# Patient Record
Sex: Female | Born: 1950 | Race: White | Hispanic: No | Smoking: Never smoker
Health system: Southern US, Community
[De-identification: ages and names within clinical notes are randomized; demographics above are authoritative.]

## PROBLEM LIST (undated history)

## (undated) DIAGNOSIS — E785 Hyperlipidemia, unspecified: Secondary | ICD-10-CM

## (undated) DIAGNOSIS — G4730 Sleep apnea, unspecified: Secondary | ICD-10-CM

## (undated) DIAGNOSIS — E669 Obesity, unspecified: Secondary | ICD-10-CM

## (undated) DIAGNOSIS — Z9889 Other specified postprocedural states: Secondary | ICD-10-CM

## (undated) DIAGNOSIS — N189 Chronic kidney disease, unspecified: Secondary | ICD-10-CM

## (undated) DIAGNOSIS — R002 Palpitations: Secondary | ICD-10-CM

## (undated) DIAGNOSIS — K760 Fatty (change of) liver, not elsewhere classified: Secondary | ICD-10-CM

## (undated) DIAGNOSIS — I10 Essential (primary) hypertension: Secondary | ICD-10-CM

## (undated) DIAGNOSIS — E119 Type 2 diabetes mellitus without complications: Secondary | ICD-10-CM

## (undated) DIAGNOSIS — E668 Other obesity: Secondary | ICD-10-CM

## (undated) DIAGNOSIS — M5412 Radiculopathy, cervical region: Secondary | ICD-10-CM

## (undated) DIAGNOSIS — T8859XA Other complications of anesthesia, initial encounter: Secondary | ICD-10-CM

## (undated) DIAGNOSIS — K219 Gastro-esophageal reflux disease without esophagitis: Secondary | ICD-10-CM

## (undated) DIAGNOSIS — R112 Nausea with vomiting, unspecified: Secondary | ICD-10-CM

## (undated) DIAGNOSIS — N289 Disorder of kidney and ureter, unspecified: Secondary | ICD-10-CM

## (undated) DIAGNOSIS — M1990 Unspecified osteoarthritis, unspecified site: Secondary | ICD-10-CM

## (undated) HISTORY — PX: CARDIAC CATHETERIZATION: SHX172

## (undated) HISTORY — PX: JOINT REPLACEMENT: SHX530

## (undated) HISTORY — DX: Chronic kidney disease, unspecified: N18.9

## (undated) HISTORY — DX: Fatty (change of) liver, not elsewhere classified: K76.0

## (undated) HISTORY — DX: Essential (primary) hypertension: I10

## (undated) HISTORY — PX: COLONOSCOPY: SHX174

## (undated) HISTORY — DX: Other obesity: E66.8

## (undated) HISTORY — DX: Hyperlipidemia, unspecified: E78.5

## (undated) HISTORY — DX: Obesity, unspecified: E66.9

## (undated) HISTORY — PX: CERVICAL FUSION: SHX112

## (undated) HISTORY — DX: Radiculopathy, cervical region: M54.12

## (undated) HISTORY — PX: CHOLECYSTECTOMY: SHX55

## (undated) HISTORY — DX: Type 2 diabetes mellitus without complications: E11.9

## (undated) HISTORY — PX: ABDOMINAL HYSTERECTOMY: SHX81

## (undated) HISTORY — PX: SPINE SURGERY: SHX786

---

## 1998-03-15 ENCOUNTER — Encounter: Payer: Self-pay | Admitting: Neurosurgery

## 1998-03-15 ENCOUNTER — Ambulatory Visit (HOSPITAL_COMMUNITY): Admission: RE | Admit: 1998-03-15 | Discharge: 1998-03-15 | Payer: Self-pay | Admitting: Neurosurgery

## 1999-04-23 ENCOUNTER — Encounter: Admission: RE | Admit: 1999-04-23 | Discharge: 1999-04-23 | Payer: Self-pay | Admitting: Orthopedic Surgery

## 1999-04-25 ENCOUNTER — Ambulatory Visit (HOSPITAL_BASED_OUTPATIENT_CLINIC_OR_DEPARTMENT_OTHER): Admission: RE | Admit: 1999-04-25 | Discharge: 1999-04-25 | Payer: Self-pay | Admitting: Orthopedic Surgery

## 2002-06-23 ENCOUNTER — Encounter: Admission: RE | Admit: 2002-06-23 | Discharge: 2002-06-23 | Payer: Self-pay | Admitting: Neurosurgery

## 2002-06-23 ENCOUNTER — Encounter: Payer: Self-pay | Admitting: Neurosurgery

## 2002-07-20 ENCOUNTER — Ambulatory Visit (HOSPITAL_COMMUNITY): Admission: RE | Admit: 2002-07-20 | Discharge: 2002-07-21 | Payer: Self-pay | Admitting: Neurosurgery

## 2002-07-20 ENCOUNTER — Encounter: Payer: Self-pay | Admitting: Neurosurgery

## 2003-05-30 ENCOUNTER — Ambulatory Visit (HOSPITAL_COMMUNITY): Admission: RE | Admit: 2003-05-30 | Discharge: 2003-05-30 | Payer: Self-pay | Admitting: Neurology

## 2003-06-22 ENCOUNTER — Observation Stay (HOSPITAL_COMMUNITY): Admission: RE | Admit: 2003-06-22 | Discharge: 2003-06-23 | Payer: Self-pay | Admitting: Neurosurgery

## 2004-05-11 ENCOUNTER — Ambulatory Visit: Payer: Self-pay | Admitting: Anesthesiology

## 2004-10-23 ENCOUNTER — Ambulatory Visit: Payer: Self-pay | Admitting: Internal Medicine

## 2004-10-29 ENCOUNTER — Ambulatory Visit: Payer: Self-pay | Admitting: Internal Medicine

## 2006-02-26 ENCOUNTER — Other Ambulatory Visit: Payer: Self-pay

## 2006-03-17 ENCOUNTER — Ambulatory Visit: Payer: Self-pay | Admitting: Otolaryngology

## 2006-06-05 ENCOUNTER — Emergency Department: Payer: Self-pay | Admitting: Unknown Physician Specialty

## 2008-04-02 ENCOUNTER — Ambulatory Visit: Payer: Self-pay | Admitting: Orthopedic Surgery

## 2009-03-06 ENCOUNTER — Ambulatory Visit: Payer: Self-pay | Admitting: Family Medicine

## 2009-03-14 ENCOUNTER — Ambulatory Visit: Payer: Self-pay | Admitting: Family Medicine

## 2009-04-23 ENCOUNTER — Ambulatory Visit: Payer: Self-pay | Admitting: Family Medicine

## 2009-05-20 ENCOUNTER — Ambulatory Visit: Payer: Self-pay | Admitting: Gastroenterology

## 2009-06-12 ENCOUNTER — Ambulatory Visit: Payer: Self-pay | Admitting: Gastroenterology

## 2009-09-30 ENCOUNTER — Ambulatory Visit: Payer: Self-pay | Admitting: Otolaryngology

## 2009-10-17 ENCOUNTER — Ambulatory Visit: Payer: Self-pay | Admitting: Otolaryngology

## 2009-11-29 ENCOUNTER — Ambulatory Visit: Payer: Self-pay | Admitting: Family Medicine

## 2009-12-31 ENCOUNTER — Ambulatory Visit: Payer: Self-pay | Admitting: Gastroenterology

## 2010-05-29 ENCOUNTER — Ambulatory Visit: Payer: Self-pay | Admitting: Family Medicine

## 2011-07-02 ENCOUNTER — Ambulatory Visit: Payer: Self-pay | Admitting: Family Medicine

## 2012-01-28 ENCOUNTER — Ambulatory Visit: Payer: Self-pay | Admitting: Family Medicine

## 2012-06-20 ENCOUNTER — Ambulatory Visit: Payer: Self-pay | Admitting: Family Medicine

## 2012-07-15 ENCOUNTER — Ambulatory Visit: Payer: Self-pay | Admitting: Family Medicine

## 2012-09-20 ENCOUNTER — Ambulatory Visit: Payer: Self-pay | Admitting: Family Medicine

## 2013-09-22 ENCOUNTER — Ambulatory Visit: Payer: Self-pay | Admitting: Family Medicine

## 2013-10-13 ENCOUNTER — Ambulatory Visit: Payer: Self-pay | Admitting: Family Medicine

## 2013-11-01 ENCOUNTER — Ambulatory Visit: Payer: Self-pay | Admitting: Family Medicine

## 2013-12-21 ENCOUNTER — Ambulatory Visit: Payer: Self-pay | Admitting: Family Medicine

## 2014-01-02 ENCOUNTER — Ambulatory Visit: Payer: Self-pay | Admitting: Family Medicine

## 2014-02-08 ENCOUNTER — Ambulatory Visit: Payer: Self-pay | Admitting: Gastroenterology

## 2014-02-13 ENCOUNTER — Ambulatory Visit: Payer: Self-pay | Admitting: Gastroenterology

## 2014-03-08 ENCOUNTER — Ambulatory Visit: Payer: Self-pay | Admitting: Family Medicine

## 2014-07-30 LAB — SURGICAL PATHOLOGY

## 2014-09-24 ENCOUNTER — Other Ambulatory Visit: Payer: Self-pay | Admitting: Family Medicine

## 2014-09-24 MED ORDER — COLESEVELAM HCL 625 MG PO TABS: 1875.0000 mg | ORAL_TABLET | Freq: Two times a day (BID) | ORAL | Status: DC

## 2014-10-02 ENCOUNTER — Encounter: Payer: Self-pay | Admitting: Family Medicine

## 2014-10-02 ENCOUNTER — Ambulatory Visit (INDEPENDENT_AMBULATORY_CARE_PROVIDER_SITE_OTHER): Payer: Medicare Other | Admitting: Family Medicine

## 2014-10-02 VITALS — BP 124/81 | HR 53 | Temp 98.1°F | Ht 62.4 in | Wt 328.0 lb

## 2014-10-02 DIAGNOSIS — I10 Essential (primary) hypertension: Secondary | ICD-10-CM | POA: Diagnosis not present

## 2014-10-02 DIAGNOSIS — E785 Hyperlipidemia, unspecified: Secondary | ICD-10-CM | POA: Diagnosis not present

## 2014-10-02 DIAGNOSIS — Z6841 Body Mass Index (BMI) 40.0 and over, adult: Secondary | ICD-10-CM | POA: Diagnosis not present

## 2014-10-02 DIAGNOSIS — E1169 Type 2 diabetes mellitus with other specified complication: Secondary | ICD-10-CM | POA: Insufficient documentation

## 2014-10-02 DIAGNOSIS — E1142 Type 2 diabetes mellitus with diabetic polyneuropathy: Secondary | ICD-10-CM | POA: Insufficient documentation

## 2014-10-02 DIAGNOSIS — Z0000 Encounter for general adult medical examination without abnormal findings: Secondary | ICD-10-CM

## 2014-10-02 DIAGNOSIS — Z1239 Encounter for other screening for malignant neoplasm of breast: Secondary | ICD-10-CM

## 2014-10-02 DIAGNOSIS — E119 Type 2 diabetes mellitus without complications: Secondary | ICD-10-CM | POA: Diagnosis not present

## 2014-10-02 DIAGNOSIS — E1159 Type 2 diabetes mellitus with other circulatory complications: Secondary | ICD-10-CM | POA: Insufficient documentation

## 2014-10-02 MED ORDER — LOVASTATIN 40 MG PO TABS: 40.0000 mg | ORAL_TABLET | Freq: Every day | ORAL | Status: DC

## 2014-10-02 MED ORDER — NEXIUM 40 MG PO CPDR: 40.0000 mg | DELAYED_RELEASE_CAPSULE | Freq: Every day | ORAL | Status: DC

## 2014-10-02 MED ORDER — PREGABALIN 150 MG PO CAPS: 150.0000 mg | ORAL_CAPSULE | Freq: Three times a day (TID) | ORAL | Status: DC

## 2014-10-02 MED ORDER — METOPROLOL SUCCINATE ER 50 MG PO TB24: 50.0000 mg | ORAL_TABLET | Freq: Every day | ORAL | Status: DC

## 2014-10-02 MED ORDER — COLESEVELAM HCL 625 MG PO TABS: 1875.0000 mg | ORAL_TABLET | Freq: Two times a day (BID) | ORAL | Status: DC

## 2014-10-02 MED ORDER — EMPAGLIFLOZIN-LINAGLIPTIN 10-5 MG PO TABS: 1.0000 | ORAL_TABLET | Freq: Every day | ORAL | Status: DC

## 2014-10-02 NOTE — Assessment & Plan Note (Signed)
Discussed diet and exercise and wt loss

## 2014-10-02 NOTE — Assessment & Plan Note (Signed)
The current medical regimen is effective;  continue present plan and medications.  

## 2014-10-02 NOTE — Assessment & Plan Note (Signed)
stable °

## 2014-10-02 NOTE — Progress Notes (Signed)
BP 124/81 mmHg  Pulse 53  Temp(Src) 98.1 F (36.7 C)  Ht 5' 2.4" (1.585 m)  Wt 328 lb (148.78 kg)  BMI 59.22 kg/m2  SpO2 99%   Subjective:    Patient ID: Anna Juarez, female    DOB: 06-22-1950, 64 y.o.   MRN: [id number]  HPI: Anna Juarez is a 64 y.o. female  Chief Complaint  Patient presents with  . Annual Exam   Lt foot takes lyrica but still bothers Discussed zostrix GI reflux better BP lipids doing well on meds No side effects Takes every day    Relevant past medical, surgical, family and social history reviewed and updated as indicated. Interim medical history since our last visit reviewed. Allergies and medications reviewed and updated.  Review of Systems  Constitutional: Negative.   HENT: Negative.   Eyes: Negative.   Respiratory: Negative.   Cardiovascular: Negative.   Gastrointestinal: Negative.   Endocrine: Negative.   Genitourinary: Negative.   Musculoskeletal: Negative.   Skin: Negative.   Allergic/Immunologic: Negative.   Neurological: Negative.   Hematological: Negative.   Psychiatric/Behavioral: Negative.     Per HPI unless specifically indicated above     Objective:    BP 124/81 mmHg  Pulse 53  Temp(Src) 98.1 F (36.7 C)  Ht 5' 2.4" (1.585 m)  Wt 328 lb (148.78 kg)  BMI 59.22 kg/m2  SpO2 99%  Wt Readings from Last 3 Encounters:  10/02/14 328 lb (148.78 kg)  08/09/14 332 lb (150.594 kg)    Physical Exam  Constitutional: She is oriented to person, place, and time. She appears well-developed and well-nourished.  HENT:  Head: Normocephalic and atraumatic.  Right Ear: External ear normal.  Left Ear: External ear normal.  Nose: Nose normal.  Mouth/Throat: Oropharynx is clear and moist.  Eyes: Conjunctivae and EOM are normal. Pupils are equal, round, and reactive to light.  Neck: Normal range of motion. Neck supple. Carotid bruit is not present.  Cardiovascular: Normal rate, regular rhythm and normal heart sounds.   No  murmur heard. Pulmonary/Chest: Effort normal and breath sounds normal. Right breast exhibits no mass and no tenderness. Left breast exhibits no mass and no tenderness. Breasts are symmetrical.  Abdominal: Soft. Bowel sounds are normal. There is no hepatosplenomegaly.  Massive obesity   Musculoskeletal: Normal range of motion.  Neurological: She is alert and oriented to person, place, and time.  Skin: No rash noted.  Psychiatric: She has a normal mood and affect. Her behavior is normal. Judgment and thought content normal.        Assessment & Plan:   Problem List Items Addressed This Visit      Cardiovascular and Mediastinum   Hypertension    The current medical regimen is effective;  continue present plan and medications.       Relevant Medications   colesevelam (WELCHOL) 625 MG tablet   lovastatin (MEVACOR) 40 MG tablet   metoprolol succinate (TOPROL-XL) 50 MG 24 hr tablet   Other Relevant Orders   Comprehensive metabolic panel   CBC with Differential/Platelet   Urinalysis, Routine w reflex microscopic (not at [facility])   TSH   Lipid panel     Endocrine   Diabetes mellitus without complication    stable      Relevant Medications   Empagliflozin-Linagliptin (GLYXAMBI) 10-5 MG TABS   lovastatin (MEVACOR) 40 MG tablet   Other Relevant Orders   Comprehensive metabolic panel   CBC with Differential/Platelet   Urinalysis, Routine w reflex  microscopic (not at [facility])   TSH   Lipid panel     Other   Hyperlipidemia    The current medical regimen is effective;  continue present plan and medications.       Relevant Medications   colesevelam (WELCHOL) 625 MG tablet   lovastatin (MEVACOR) 40 MG tablet   metoprolol succinate (TOPROL-XL) 50 MG 24 hr tablet   Other Relevant Orders   Comprehensive metabolic panel   CBC with Differential/Platelet   Urinalysis, Routine w reflex microscopic (not at [facility])   TSH   Lipid panel   BMI 50.0-59.9, adult - Primary    Discussed diet  and exercise and wt loss      Relevant Orders   Comprehensive metabolic panel   CBC with Differential/Platelet   Urinalysis, Routine w reflex microscopic (not at [facility])   TSH   Lipid panel       Follow up plan: Return in about 3 months (around 01/02/2015).

## 2014-10-09 ENCOUNTER — Telehealth: Payer: Self-pay

## 2014-10-09 ENCOUNTER — Ambulatory Visit
Admission: RE | Admit: 2014-10-09 | Discharge: 2014-10-09 | Disposition: A | Discharge status: Home / Self Care | Payer: Medicare Other | Source: Ambulatory Visit | Attending: Family Medicine | Admitting: Family Medicine

## 2014-10-09 DIAGNOSIS — Z1231 Encounter for screening mammogram for malignant neoplasm of breast: Secondary | ICD-10-CM | POA: Diagnosis present

## 2014-10-09 DIAGNOSIS — Z1239 Encounter for other screening for malignant neoplasm of breast: Secondary | ICD-10-CM

## 2014-10-09 NOTE — Telephone Encounter (Signed)
-----   Message from Valerie Roys, DO sent at 10/09/2014 12:39 PM EDT ----- Patient did not get her blood work done. Please get her appt for lab work to get overdue blood work done.

## 2014-10-09 NOTE — Telephone Encounter (Signed)
Notified patient via voicemail. 

## 2014-10-10 ENCOUNTER — Encounter: Payer: Self-pay | Admitting: Family Medicine

## 2014-10-16 ENCOUNTER — Other Ambulatory Visit: Payer: Medicare Other

## 2014-10-16 ENCOUNTER — Other Ambulatory Visit: Payer: Self-pay | Admitting: Family Medicine

## 2014-10-16 DIAGNOSIS — E785 Hyperlipidemia, unspecified: Secondary | ICD-10-CM

## 2014-10-16 DIAGNOSIS — Z0000 Encounter for general adult medical examination without abnormal findings: Secondary | ICD-10-CM

## 2014-10-16 DIAGNOSIS — I10 Essential (primary) hypertension: Secondary | ICD-10-CM

## 2014-10-16 DIAGNOSIS — E119 Type 2 diabetes mellitus without complications: Secondary | ICD-10-CM

## 2014-10-16 DIAGNOSIS — Z6841 Body Mass Index (BMI) 40.0 and over, adult: Secondary | ICD-10-CM

## 2014-10-16 LAB — URINALYSIS, ROUTINE W REFLEX MICROSCOPIC
BILIRUBIN UA: NEGATIVE
Ketones, UA: NEGATIVE
LEUKOCYTES UA: NEGATIVE
NITRITE UA: NEGATIVE
PH UA: 5.5 (ref 5.0–7.5)
Protein, UA: NEGATIVE
RBC UA: NEGATIVE
Specific Gravity, UA: 1.015 (ref 1.005–1.030)
Urobilinogen, Ur: 0.2 mg/dL (ref 0.2–1.0)

## 2014-10-17 LAB — LIPID PANEL
Chol/HDL Ratio: 3.1 ratio units (ref 0.0–4.4)
Cholesterol, Total: 131 mg/dL (ref 100–199)
HDL: 42 mg/dL (ref >39)
LDL CALC: 61 mg/dL (ref 0–99)
TRIGLYCERIDES: 139 mg/dL (ref 0–149)
VLDL Cholesterol Cal: 28 mg/dL (ref 5–40)

## 2014-10-17 LAB — COMPREHENSIVE METABOLIC PANEL
A/G RATIO: 1.5 (ref 1.1–2.5)
ALBUMIN: 4.2 g/dL (ref 3.6–4.8)
ALT: 32 IU/L (ref 0–32)
AST: 45 IU/L — ABNORMAL HIGH (ref 0–40)
Alkaline Phosphatase: 54 IU/L (ref 39–117)
BUN/Creatinine Ratio: 16 (ref 11–26)
BUN: 23 mg/dL (ref 8–27)
Bilirubin Total: 0.5 mg/dL (ref 0.0–1.2)
CALCIUM: 9.2 mg/dL (ref 8.7–10.3)
CHLORIDE: 101 mmol/L (ref 97–108)
CO2: 22 mmol/L (ref 18–29)
CREATININE: 1.47 mg/dL — AB (ref 0.57–1.00)
GFR, EST AFRICAN AMERICAN: 43 mL/min/{1.73_m2} — AB (ref >59)
GFR, EST NON AFRICAN AMERICAN: 37 mL/min/{1.73_m2} — AB (ref >59)
Globulin, Total: 2.8 g/dL (ref 1.5–4.5)
Glucose: 169 mg/dL — ABNORMAL HIGH (ref 65–99)
Potassium: 4.9 mmol/L (ref 3.5–5.2)
SODIUM: 142 mmol/L (ref 134–144)
TOTAL PROTEIN: 7 g/dL (ref 6.0–8.5)

## 2014-10-17 LAB — CBC WITH DIFFERENTIAL/PLATELET
BASOS: 1 %
Basophils Absolute: 0 10*3/uL (ref 0.0–0.2)
EOS (ABSOLUTE): 0.1 10*3/uL (ref 0.0–0.4)
EOS: 3 %
Hematocrit: 39.2 % (ref 34.0–46.6)
Hemoglobin: 13 g/dL (ref 11.1–15.9)
Immature Grans (Abs): 0 10*3/uL (ref 0.0–0.1)
Immature Granulocytes: 0 %
Lymphocytes Absolute: 1.5 10*3/uL (ref 0.7–3.1)
Lymphs: 28 %
MCH: 30 pg (ref 26.6–33.0)
MCHC: 33.2 g/dL (ref 31.5–35.7)
MCV: 91 fL (ref 79–97)
MONOCYTES: 11 %
MONOS ABS: 0.6 10*3/uL (ref 0.1–0.9)
NEUTROS ABS: 3.1 10*3/uL (ref 1.4–7.0)
Neutrophils: 57 %
Platelets: 182 10*3/uL (ref 150–379)
RBC: 4.33 x10E6/uL (ref 3.77–5.28)
RDW: 14.1 % (ref 12.3–15.4)
WBC: 5.3 10*3/uL (ref 3.4–10.8)

## 2014-10-17 LAB — TSH: TSH: 2.08 u[IU]/mL (ref 0.450–4.500)

## 2014-11-19 ENCOUNTER — Other Ambulatory Visit: Payer: Self-pay | Admitting: Family Medicine

## 2014-11-19 MED ORDER — PREGABALIN 150 MG PO CAPS: 150.0000 mg | ORAL_CAPSULE | Freq: Three times a day (TID) | ORAL | Status: DC

## 2014-11-20 ENCOUNTER — Other Ambulatory Visit: Payer: Self-pay | Admitting: Family Medicine

## 2014-12-31 ENCOUNTER — Other Ambulatory Visit: Payer: Self-pay | Admitting: Family Medicine

## 2015-01-02 ENCOUNTER — Encounter: Payer: Self-pay | Admitting: Family Medicine

## 2015-01-02 ENCOUNTER — Ambulatory Visit (INDEPENDENT_AMBULATORY_CARE_PROVIDER_SITE_OTHER): Payer: Medicare Other | Admitting: Family Medicine

## 2015-01-02 VITALS — BP 108/70 | HR 50 | Temp 97.8°F | Ht 62.3 in | Wt 321.0 lb

## 2015-01-02 DIAGNOSIS — Z23 Encounter for immunization: Secondary | ICD-10-CM | POA: Diagnosis not present

## 2015-01-02 DIAGNOSIS — E1122 Type 2 diabetes mellitus with diabetic chronic kidney disease: Secondary | ICD-10-CM

## 2015-01-02 DIAGNOSIS — N183 Chronic kidney disease, stage 3 (moderate): Secondary | ICD-10-CM | POA: Diagnosis not present

## 2015-01-02 DIAGNOSIS — Z6841 Body Mass Index (BMI) 40.0 and over, adult: Secondary | ICD-10-CM

## 2015-01-02 DIAGNOSIS — E119 Type 2 diabetes mellitus without complications: Secondary | ICD-10-CM | POA: Diagnosis not present

## 2015-01-02 DIAGNOSIS — I10 Essential (primary) hypertension: Secondary | ICD-10-CM | POA: Diagnosis not present

## 2015-01-02 NOTE — Assessment & Plan Note (Signed)
Continue good diabetes care

## 2015-01-02 NOTE — Progress Notes (Signed)
BP 108/70 mmHg  Pulse 50  Temp(Src) 97.8 F (36.6 C)  Ht 5' 2.3" (1.582 m)  Wt 321 lb (145.605 kg)  BMI 58.18 kg/m2  SpO2 96%   Subjective:    Patient ID: Anna Juarez, female    DOB: 1950/07/06, 64 y.o.   MRN: [id number]  HPI: Anna Juarez is a 64 y.o. female  Chief Complaint  Patient presents with  . Diabetes   Patient doing well blood sugar coming down and is lost 10 pounds this summer. His weight loss was secondary to stress his husband's nursing home bound with knee replacement surgery. Blood pressure doing well. Lipids doing well No complaints from medication takes everyday with no side effects. seeing a kidney doctor for CK D. Relevant past medical, surgical, family and social history reviewed and updated as indicated. Interim medical history since our last visit reviewed. Allergies and medications reviewed and updated.  Review of Systems  Constitutional: Negative.   Respiratory: Negative.   Cardiovascular: Negative.     Per HPI unless specifically indicated above     Objective:    BP 108/70 mmHg  Pulse 50  Temp(Src) 97.8 F (36.6 C)  Ht 5' 2.3" (1.582 m)  Wt 321 lb (145.605 kg)  BMI 58.18 kg/m2  SpO2 96%  Wt Readings from Last 3 Encounters:  01/02/15 321 lb (145.605 kg)  10/02/14 328 lb (148.78 kg)  08/09/14 332 lb (150.594 kg)    Physical Exam  Constitutional: She is oriented to person, place, and time. She appears well-developed and well-nourished. No distress.  HENT:  Head: Normocephalic and atraumatic.  Right Ear: Hearing normal.  Left Ear: Hearing normal.  Nose: Nose normal.  Eyes: Conjunctivae and lids are normal. Right eye exhibits no discharge. Left eye exhibits no discharge. No scleral icterus.  Cardiovascular: Normal rate, regular rhythm and normal heart sounds.   Pulmonary/Chest: Effort normal and breath sounds normal. No respiratory distress.  Musculoskeletal: Normal range of motion.  Neurological: She is alert and  oriented to person, place, and time.  Skin: Skin is intact. No rash noted.  Psychiatric: She has a normal mood and affect. Her speech is normal and behavior is normal. Judgment and thought content normal. Cognition and memory are normal.    Results for orders placed or performed in visit on 10/16/14  Comprehensive metabolic panel  Result Value Ref Range   Glucose 169 (H) 65 - 99 mg/dL   BUN 23 8 - 27 mg/dL   Creatinine, Ser 1.47 (H) 0.57 - 1.00 mg/dL   GFR calc non Af Amer 37 (L) >59 mL/min/1.73   GFR calc Af Amer 43 (L) >59 mL/min/1.73   BUN/Creatinine Ratio 16 11 - 26   Sodium 142 134 - 144 mmol/L   Potassium 4.9 3.5 - 5.2 mmol/L   Chloride 101 97 - 108 mmol/L   CO2 22 18 - 29 mmol/L   Calcium 9.2 8.7 - 10.3 mg/dL   Total Protein 7.0 6.0 - 8.5 g/dL   Albumin 4.2 3.6 - 4.8 g/dL   Globulin, Total 2.8 1.5 - 4.5 g/dL   Albumin/Globulin Ratio 1.5 1.1 - 2.5   Bilirubin Total 0.5 0.0 - 1.2 mg/dL   Alkaline Phosphatase 54 39 - 117 IU/L   AST 45 (H) 0 - 40 IU/L   ALT 32 0 - 32 IU/L  CBC with Differential/Platelet  Result Value Ref Range   WBC 5.3 3.4 - 10.8 x10E3/uL   RBC 4.33 3.77 - 5.28 x10E6/uL  Hemoglobin 13.0 11.1 - 15.9 g/dL   Hematocrit 39.2 34.0 - 46.6 %   MCV 91 79 - 97 fL   MCH 30.0 26.6 - 33.0 pg   MCHC 33.2 31.5 - 35.7 g/dL   RDW 14.1 12.3 - 15.4 %   Platelets 182 150 - 379 x10E3/uL   Neutrophils 57 %   Lymphs 28 %   Monocytes 11 %   Eos 3 %   Basos 1 %   Neutrophils Absolute 3.1 1.4 - 7.0 x10E3/uL   Lymphocytes Absolute 1.5 0.7 - 3.1 x10E3/uL   Monocytes Absolute 0.6 0.1 - 0.9 x10E3/uL   EOS (ABSOLUTE) 0.1 0.0 - 0.4 x10E3/uL   Basophils Absolute 0.0 0.0 - 0.2 x10E3/uL   Immature Granulocytes 0 %   Immature Grans (Abs) 0.0 0.0 - 0.1 x10E3/uL  TSH  Result Value Ref Range   TSH 2.080 0.450 - 4.500 uIU/mL  Lipid panel  Result Value Ref Range   Cholesterol, Total 131 100 - 199 mg/dL   Triglycerides 139 0 - 149 mg/dL   HDL 42 >39 mg/dL   VLDL Cholesterol Cal  28 5 - 40 mg/dL   LDL Calculated 61 0 - 99 mg/dL   Chol/HDL Ratio 3.1 0.0 - 4.4 ratio units  Urinalysis, Routine w reflex microscopic (not at [facility])  Result Value Ref Range   Specific Gravity, UA 1.015 1.005 - 1.030   pH, UA 5.5 5.0 - 7.5   Color, UA Yellow Yellow   Appearance Ur Clear Clear   Leukocytes, UA Negative Negative   Protein, UA Negative Negative/Trace   Glucose, UA 3+ (A) Negative   Ketones, UA Negative Negative   RBC, UA Negative Negative   Bilirubin, UA Negative Negative   Urobilinogen, Ur 0.2 0.2 - 1.0 mg/dL   Nitrite, UA Negative Negative      Assessment & Plan:   Problem List Items Addressed This Visit      Cardiovascular and Mediastinum   Essential hypertension    The current medical regimen is effective;  continue present plan and medications.         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

## 2015-01-02 NOTE — Assessment & Plan Note (Signed)
The current medical regimen is effective;  continue present plan and medications.  

## 2015-01-02 NOTE — Assessment & Plan Note (Signed)
Continue diet exercise weight loss

## 2015-01-04 LAB — MICROALBUMIN, URINE WAIVED
CREATININE, URINE WAIVED: 200 mg/dL (ref 10–300)
Microalb, Ur Waived: 30 mg/L — ABNORMAL HIGH (ref 0–19)
Microalb/Creat Ratio: <30 mg/g (ref <30)

## 2015-01-04 LAB — BAYER DCA HB A1C WAIVED: HB A1C: 7.3 % — AB (ref <7.0)

## 2015-01-29 ENCOUNTER — Telehealth: Payer: Self-pay

## 2015-01-29 NOTE — Telephone Encounter (Signed)
Called patient about colocancer screening and diabetic eye exam within the past year. Patient said she got her eye exam back in the spring at [facility]. Patient doesn't want a colonoscopy but does want the cologuard.  Will call [facility] and request her last eye exam.

## 2015-02-05 ENCOUNTER — Other Ambulatory Visit: Payer: Self-pay | Admitting: Family Medicine

## 2015-02-05 DIAGNOSIS — Z1212 Encounter for screening for malignant neoplasm of rectum: Principal | ICD-10-CM

## 2015-02-05 DIAGNOSIS — Z1211 Encounter for screening for malignant neoplasm of colon: Secondary | ICD-10-CM

## 2015-03-27 ENCOUNTER — Ambulatory Visit (INDEPENDENT_AMBULATORY_CARE_PROVIDER_SITE_OTHER): Payer: Medicare Other | Admitting: Family Medicine

## 2015-03-27 ENCOUNTER — Encounter: Payer: Self-pay | Admitting: Family Medicine

## 2015-03-27 VITALS — BP 114/75 | HR 53 | Temp 98.0°F | Ht 62.3 in | Wt 320.0 lb

## 2015-03-27 DIAGNOSIS — N183 Chronic kidney disease, stage 3 (moderate): Secondary | ICD-10-CM | POA: Diagnosis not present

## 2015-03-27 DIAGNOSIS — Z6841 Body Mass Index (BMI) 40.0 and over, adult: Secondary | ICD-10-CM | POA: Diagnosis not present

## 2015-03-27 DIAGNOSIS — E119 Type 2 diabetes mellitus without complications: Secondary | ICD-10-CM | POA: Diagnosis not present

## 2015-03-27 DIAGNOSIS — E785 Hyperlipidemia, unspecified: Secondary | ICD-10-CM

## 2015-03-27 DIAGNOSIS — I10 Essential (primary) hypertension: Secondary | ICD-10-CM | POA: Diagnosis not present

## 2015-03-27 DIAGNOSIS — E1122 Type 2 diabetes mellitus with diabetic chronic kidney disease: Secondary | ICD-10-CM | POA: Diagnosis not present

## 2015-03-27 LAB — LP+ALT+AST PICCOLO, WAIVED
ALT (SGPT) Piccolo, Waived: 27 U/L (ref 10–47)
AST (SGOT) PICCOLO, WAIVED: 38 U/L (ref 11–38)
CHOL/HDL RATIO PICCOLO,WAIVE: 2.4 mg/dL
Cholesterol Piccolo, Waived: 149 mg/dL (ref <200)
HDL CHOL PICCOLO, WAIVED: 61 mg/dL (ref >59)
LDL Chol Calc Piccolo Waived: 54 mg/dL (ref <100)
TRIGLYCERIDES PICCOLO,WAIVED: 169 mg/dL — AB (ref <150)
VLDL CHOL CALC PICCOLO,WAIVE: 34 mg/dL — AB (ref <30)

## 2015-03-27 LAB — BAYER DCA HB A1C WAIVED: HB A1C (BAYER DCA - WAIVED): 7.1 % — ABNORMAL HIGH (ref <7.0)

## 2015-03-27 MED ORDER — PREGABALIN 150 MG PO CAPS: 150.0000 mg | ORAL_CAPSULE | Freq: Three times a day (TID) | ORAL | Status: DC

## 2015-03-27 NOTE — Assessment & Plan Note (Signed)
The current medical regimen is effective;  continue present plan and medications.  

## 2015-03-27 NOTE — Assessment & Plan Note (Signed)
Discussed diet  

## 2015-03-27 NOTE — Progress Notes (Signed)
BP 114/75 mmHg  Pulse 53  Temp(Src) 98 F (36.7 C)  Ht 5' 2.3" (1.582 m)  Wt 320 lb (145.151 kg)  BMI 58.00 kg/m2  SpO2 98%   Subjective:    Patient ID: Anna Juarez, female    DOB: 1950-12-01, 64 y.o.   MRN: [id number]  HPI: Anna Juarez is a 64 y.o. female  Chief Complaint  Patient presents with  . Diabetes  . Hyperlipidemia  . Hypertension   Patient all in all doing well except for some Christmas eating Blood pressure cholesterol diabetes doing well noted low blood sugar spells no side effects with medications and taking medicines faithfully DP and stable with Lyrica Relevant past medical, surgical, family and social history reviewed and updated as indicated. Interim medical history since our last visit reviewed. Allergies and medications reviewed and updated.  Review of Systems  Constitutional: Negative.   Respiratory: Negative.   Cardiovascular: Negative.     Per HPI unless specifically indicated above     Objective:    BP 114/75 mmHg  Pulse 53  Temp(Src) 98 F (36.7 C)  Ht 5' 2.3" (1.582 m)  Wt 320 lb (145.151 kg)  BMI 58.00 kg/m2  SpO2 98%  Wt Readings from Last 3 Encounters:  03/27/15 320 lb (145.151 kg)  01/02/15 321 lb (145.605 kg)  10/02/14 328 lb (148.78 kg)    Physical Exam  Constitutional: She is oriented to person, place, and time. She appears well-developed and well-nourished. No distress.  HENT:  Head: Normocephalic and atraumatic.  Right Ear: Hearing normal.  Left Ear: Hearing normal.  Nose: Nose normal.  Eyes: Conjunctivae and lids are normal. Right eye exhibits no discharge. Left eye exhibits no discharge. No scleral icterus.  Cardiovascular: Normal rate, regular rhythm and normal heart sounds.   Pulmonary/Chest: Effort normal and breath sounds normal. No respiratory distress.  Musculoskeletal: Normal range of motion.  Neurological: She is alert and oriented to person, place, and time.  Skin: Skin is intact. No rash  noted.  Psychiatric: She has a normal mood and affect. Her speech is normal and behavior is normal. Judgment and thought content normal. Cognition and memory are normal.    Results for orders placed or performed in visit on 01/02/15  Bayer DCA Hb A1c Waived  Result Value Ref Range   Bayer DCA Hb A1c Waived 7.3 (H) <7.0 %  Microalbumin, Urine Waived (STAT)  Result Value Ref Range   Microalb, Ur Waived 30 (H) 0 - 19 mg/L   Creatinine, Urine Waived 200 10 - 300 mg/dL   Microalb/Creat Ratio <30 <30 mg/g      Assessment & Plan:   Problem List Items Addressed This Visit      Cardiovascular and Mediastinum   Essential hypertension    The current medical regimen is effective;  continue present plan and medications.         Endocrine   DM type 2 causing CKD stage 3 (HCC)    The current medical regimen is effective;  continue present plan and medications.         Other   Hyperlipidemia    The current medical regimen is effective;  continue present plan and medications.       BMI 50.0-59.9, adult ([place])    Discussed diet       Other Visit Diagnoses    Diabetes mellitus without complication ([place])    -  Primary    Relevant Orders    Bayer DCA Hb  A1c Waived    Hyperlipemia        Relevant Orders    LP+ALT+AST Piccolo, Waived    Essential hypertension, benign        Relevant Orders    Basic metabolic panel        Follow up plan: Return in about 3 months (around 06/25/2015) for a1c.

## 2015-03-28 ENCOUNTER — Encounter: Payer: Self-pay | Admitting: Family Medicine

## 2015-03-28 LAB — BASIC METABOLIC PANEL
BUN/Creatinine Ratio: 16 (ref 11–26)
BUN: 24 mg/dL (ref 8–27)
CALCIUM: 9.5 mg/dL (ref 8.7–10.3)
CO2: 24 mmol/L (ref 18–29)
CREATININE: 1.46 mg/dL — AB (ref 0.57–1.00)
Chloride: 103 mmol/L (ref 96–106)
GFR calc Af Amer: 43 mL/min/{1.73_m2} — ABNORMAL LOW (ref >59)
GFR, EST NON AFRICAN AMERICAN: 37 mL/min/{1.73_m2} — AB (ref >59)
GLUCOSE: 131 mg/dL — AB (ref 65–99)
Potassium: 4.7 mmol/L (ref 3.5–5.2)
SODIUM: 142 mmol/L (ref 134–144)

## 2015-05-20 ENCOUNTER — Other Ambulatory Visit: Payer: Self-pay | Admitting: Family Medicine

## 2015-06-26 ENCOUNTER — Ambulatory Visit: Payer: Medicare Other | Admitting: Family Medicine

## 2015-06-30 ENCOUNTER — Other Ambulatory Visit: Payer: Self-pay | Admitting: Family Medicine

## 2015-07-09 ENCOUNTER — Ambulatory Visit (INDEPENDENT_AMBULATORY_CARE_PROVIDER_SITE_OTHER): Payer: Medicare Other | Admitting: Family Medicine

## 2015-07-09 ENCOUNTER — Encounter: Payer: Self-pay | Admitting: Family Medicine

## 2015-07-09 VITALS — BP 127/76 | HR 58 | Temp 97.8°F | Ht 63.0 in | Wt 321.0 lb

## 2015-07-09 DIAGNOSIS — N183 Chronic kidney disease, stage 3 unspecified: Secondary | ICD-10-CM

## 2015-07-09 DIAGNOSIS — E785 Hyperlipidemia, unspecified: Secondary | ICD-10-CM | POA: Diagnosis not present

## 2015-07-09 DIAGNOSIS — E1122 Type 2 diabetes mellitus with diabetic chronic kidney disease: Secondary | ICD-10-CM

## 2015-07-09 DIAGNOSIS — I10 Essential (primary) hypertension: Secondary | ICD-10-CM | POA: Diagnosis not present

## 2015-07-09 LAB — BAYER DCA HB A1C WAIVED: HB A1C: 7.1 % — AB (ref <7.0)

## 2015-07-09 NOTE — Assessment & Plan Note (Signed)
The current medical regimen is effective;  continue present plan and medications.  

## 2015-07-09 NOTE — Progress Notes (Signed)
BP 127/76 mmHg  Pulse 58  Temp(Src) 97.8 F (36.6 C)  Ht 5\' 3"  (1.6 m)  Wt 321 lb (145.605 kg)  BMI 56.88 kg/m2  SpO2 99%   Subjective:    Patient ID: Anna Juarez, female    DOB: [birth year], 65 y.o.   MRN: [id number]  HPI: Anna Juarez is a 65 y.o. female  Chief Complaint  Patient presents with  . Diabetes   Diabetes doing well no complaints from medications noted low blood sugar spells Blood pressure good control no issues with medications Cholesterol doing well with medications no complaints  Patient having a great deal of stress with husband's very sick from some mystery illness.  Relevant past medical, surgical, family and social history reviewed and updated as indicated. Interim medical history since our last visit reviewed. Allergies and medications reviewed and updated.  Review of Systems  Constitutional: Negative.   Respiratory: Negative.   Cardiovascular: Negative.     Per HPI unless specifically indicated above     Objective:    BP 127/76 mmHg  Pulse 58  Temp(Src) 97.8 F (36.6 C)  Ht 5\' 3"  (1.6 m)  Wt 321 lb (145.605 kg)  BMI 56.88 kg/m2  SpO2 99%  Wt Readings from Last 3 Encounters:  07/09/15 321 lb (145.605 kg)  03/27/15 320 lb (145.151 kg)  01/02/15 321 lb (145.605 kg)    Physical Exam  Constitutional: She is oriented to person, place, and time. She appears well-developed and well-nourished. No distress.  HENT:  Head: Normocephalic and atraumatic.  Right Ear: Hearing normal.  Left Ear: Hearing normal.  Nose: Nose normal.  Eyes: Conjunctivae and lids are normal. Right eye exhibits no discharge. Left eye exhibits no discharge. No scleral icterus.  Cardiovascular: Normal rate, regular rhythm and normal heart sounds.   Pulmonary/Chest: Effort normal and breath sounds normal. No respiratory distress.  Musculoskeletal: Normal range of motion.  Neurological: She is alert and oriented to person, place, and time.  Skin: Skin is  intact. No rash noted.  Psychiatric: She has a normal mood and affect. Her speech is normal and behavior is normal. Judgment and thought content normal. Cognition and memory are normal.    Results for orders placed or performed in visit on 03/27/15  Bayer DCA Hb A1c Waived  Result Value Ref Range   Bayer DCA Hb A1c Waived 7.1 (H) <7.0 %  LP+ALT+AST Piccolo, Waived  Result Value Ref Range   ALT (SGPT) Piccolo, Waived 27 10 - 47 U/L   AST (SGOT) Piccolo, Waived 38 11 - 38 U/L   Cholesterol Piccolo, Waived 149 <200 mg/dL   HDL Chol Piccolo, Waived 61 >59 mg/dL   Triglycerides Piccolo,Waived 169 (H) <150 mg/dL   Chol/HDL Ratio Piccolo,Waive 2.4 mg/dL   LDL Chol Calc Piccolo Waived 54 <100 mg/dL   VLDL Chol Calc Piccolo,Waive 34 (H) <30 mg/dL  Basic metabolic panel  Result Value Ref Range   Glucose 131 (H) 65 - 99 mg/dL   BUN 24 8 - 27 mg/dL   Creatinine, Ser 1.46 (H) 0.57 - 1.00 mg/dL   GFR calc non Af Amer 37 (L) >59 mL/min/1.73   GFR calc Af Amer 43 (L) >59 mL/min/1.73   BUN/Creatinine Ratio 16 11 - 26   Sodium 142 134 - 144 mmol/L   Potassium 4.7 3.5 - 5.2 mmol/L   Chloride 103 96 - 106 mmol/L   CO2 24 18 - 29 mmol/L   Calcium 9.5 8.7 - 10.3 mg/dL  Assessment & Plan:   Problem List Items Addressed This Visit      Cardiovascular and Mediastinum   Essential hypertension    The current medical regimen is effective;  continue present plan and medications.         Endocrine   DM type 2 causing CKD stage 3 ([place]) - Primary    The current medical regimen is effective;  continue present plan and medications.       Relevant Orders   Bayer DCA Hb A1c Waived     Other   Hyperlipidemia    The current medical regimen is effective;  continue present plan and medications.           Follow up plan: Return in about 3 months (around 10/08/2015) for Physical Exam.

## 2015-09-23 ENCOUNTER — Telehealth: Payer: Self-pay | Admitting: Family Medicine

## 2015-09-23 NOTE — Telephone Encounter (Signed)
Pt called stated she is having pain in her elbow. Would like to get a cortisone injection. Can we add patient to the schedule? Please advise.

## 2015-09-23 NOTE — Telephone Encounter (Signed)
Yes work in with [employer]

## 2015-09-24 NOTE — Telephone Encounter (Signed)
I will call pt and add her to Rachel's schedule for next week.

## 2015-09-25 NOTE — Telephone Encounter (Signed)
Called pt to schedule with Anna Juarez. Pt stated she will have to call back to schedule as she will need to find someone to sit with her husband. Thanks.

## 2015-10-13 ENCOUNTER — Other Ambulatory Visit: Payer: Self-pay | Admitting: Family Medicine

## 2015-10-31 ENCOUNTER — Encounter: Payer: Self-pay | Admitting: Family Medicine

## 2015-10-31 ENCOUNTER — Ambulatory Visit (INDEPENDENT_AMBULATORY_CARE_PROVIDER_SITE_OTHER): Payer: Self-pay | Admitting: Family Medicine

## 2015-10-31 VITALS — BP 129/80 | HR 54 | Temp 97.7°F | Ht 63.0 in | Wt 324.0 lb

## 2015-10-31 DIAGNOSIS — E119 Type 2 diabetes mellitus without complications: Secondary | ICD-10-CM

## 2015-10-31 DIAGNOSIS — E1142 Type 2 diabetes mellitus with diabetic polyneuropathy: Secondary | ICD-10-CM

## 2015-10-31 DIAGNOSIS — Z6841 Body Mass Index (BMI) 40.0 and over, adult: Secondary | ICD-10-CM

## 2015-10-31 DIAGNOSIS — Z23 Encounter for immunization: Secondary | ICD-10-CM

## 2015-10-31 DIAGNOSIS — Z0000 Encounter for general adult medical examination without abnormal findings: Secondary | ICD-10-CM

## 2015-10-31 DIAGNOSIS — E785 Hyperlipidemia, unspecified: Secondary | ICD-10-CM

## 2015-10-31 DIAGNOSIS — Z1231 Encounter for screening mammogram for malignant neoplasm of breast: Secondary | ICD-10-CM

## 2015-10-31 DIAGNOSIS — I10 Essential (primary) hypertension: Secondary | ICD-10-CM

## 2015-10-31 LAB — URINALYSIS, ROUTINE W REFLEX MICROSCOPIC
BILIRUBIN UA: NEGATIVE
KETONES UA: NEGATIVE
Leukocytes, UA: NEGATIVE
NITRITE UA: NEGATIVE
Protein, UA: NEGATIVE
RBC UA: NEGATIVE
SPEC GRAV UA: 1.02 (ref 1.005–1.030)
Urobilinogen, Ur: 0.2 mg/dL (ref 0.2–1.0)
pH, UA: 5 (ref 5.0–7.5)

## 2015-10-31 LAB — BAYER DCA HB A1C WAIVED: HB A1C (BAYER DCA - WAIVED): 7.4 % — ABNORMAL HIGH (ref <7.0)

## 2015-10-31 MED ORDER — PREGABALIN 150 MG PO CAPS: 150.0000 mg | ORAL_CAPSULE | Freq: Three times a day (TID) | ORAL | 2 refills | Status: DC

## 2015-10-31 MED ORDER — COLESEVELAM HCL 625 MG PO TABS: 1875.0000 mg | ORAL_TABLET | Freq: Two times a day (BID) | ORAL | 4 refills | Status: DC

## 2015-10-31 MED ORDER — LOVASTATIN 40 MG PO TABS: 40.0000 mg | ORAL_TABLET | Freq: Every day | ORAL | 4 refills | Status: DC

## 2015-10-31 MED ORDER — METOPROLOL SUCCINATE ER 50 MG PO TB24: 50.0000 mg | ORAL_TABLET | Freq: Every day | ORAL | 4 refills | Status: DC

## 2015-10-31 MED ORDER — BENAZEPRIL HCL 40 MG PO TABS: 40.0000 mg | ORAL_TABLET | Freq: Every day | ORAL | 4 refills | Status: DC

## 2015-10-31 MED ORDER — NEXIUM 40 MG PO CPDR: 40.0000 mg | DELAYED_RELEASE_CAPSULE | Freq: Every day | ORAL | 4 refills | Status: DC

## 2015-10-31 MED ORDER — EMPAGLIFLOZIN-LINAGLIPTIN 10-5 MG PO TABS: 1.0000 | ORAL_TABLET | Freq: Every day | ORAL | 4 refills | Status: DC

## 2015-10-31 NOTE — Patient Instructions (Addendum)
Pneumococcal Polysaccharide Vaccine: What You Need to Know  1. Why get vaccinated?  Vaccination can protect older adults (and some children and younger adults) from pneumococcal disease.  Pneumococcal disease is caused by bacteria that can spread from person to person through close contact. It can cause ear infections, and it can also lead to more serious infections of the:   · Lungs (pneumonia),  · Blood (bacteremia), and  · Covering of the brain and spinal cord (meningitis). Meningitis can cause deafness and brain damage, and it can be fatal.  Anyone can get pneumococcal disease, but children under 2 years of age, people with certain medical conditions, adults over 65 years of age, and cigarette smokers are at the highest risk.  About 18,000 older adults die each year from pneumococcal disease in the [place].  Treatment of pneumococcal infections with penicillin and other drugs used to be more effective. But some strains of the disease have become resistant to these drugs. This makes prevention of the disease, through vaccination, even more important.  2. Pneumococcal polysaccharide vaccine (PPSV23)  Pneumococcal polysaccharide vaccine (PPSV23) protects against 23 types of pneumococcal bacteria. It will not prevent all pneumococcal disease.  PPSV23 is recommended for:  · All adults 65 years of age and older,  · Anyone 2 through 67 years of age with certain long-term health problems,  · Anyone 2 through 67 years of age with a weakened immune system,  · Adults 19 through 67 years of age who smoke cigarettes or have asthma.  Most people need only one dose of PPSV. A second dose is recommended for certain high-risk groups. People 65 and older should get a dose even if they have gotten one or more doses of the vaccine before they turned 65.  Your healthcare provider can give you more information about these recommendations.  Most healthy adults develop protection within 2 to 3 weeks of getting the shot.  3. Some  people should not get this vaccine  · Anyone who has had a life-threatening allergic reaction to PPSV should not get another dose.  · Anyone who has a severe allergy to any component of PPSV should not receive it. Tell your provider if you have any severe allergies.  · Anyone who is moderately or severely ill when the shot is scheduled may be asked to wait until they recover before getting the vaccine. Someone with a mild illness can usually be vaccinated.  · Children less than 2 years of age should not receive this vaccine.  · There is no evidence that PPSV is harmful to either a pregnant woman or to her fetus. However, as a precaution, women who need the vaccine should be vaccinated before becoming pregnant, if possible.  4. Risks of a vaccine reaction  With any medicine, including vaccines, there is a chance of side effects. These are usually mild and go away on their own, but serious reactions are also possible.  About half of people who get PPSV have mild side effects, such as redness or pain where the shot is given, which go away within about two days.  Less than 1 out of 100 people develop a fever, muscle aches, or more severe local reactions.  Problems that could happen after any vaccine:  · People sometimes faint after a medical procedure, including vaccination. Sitting or lying down for about 15 minutes can help prevent fainting, and injuries caused by a fall. Tell your doctor if you feel dizzy, or have vision changes or   ringing in the ears.  · Some people get severe pain in the shoulder and have difficulty moving the arm where a shot was given. This happens very rarely.  · Any medication can cause a severe allergic reaction. Such reactions from a vaccine are very rare, estimated at about 1 in a million doses, and would happen within a few minutes to a few hours after the vaccination.  As with any medicine, there is a very remote chance of a vaccine causing a serious injury or death.  The safety of  vaccines is always being monitored. For more information, visit: www.cdc.gov/vaccinesafety/  5. What if there is a serious reaction?  What should I look for?  Look for anything that concerns you, such as signs of a severe allergic reaction, very high fever, or unusual behavior.   Signs of a severe allergic reaction can include hives, swelling of the face and throat, difficulty breathing, a fast heartbeat, dizziness, and weakness. These would usually start a few minutes to a few hours after the vaccination.  What should I do?  If you think it is a severe allergic reaction or other emergency that can't wait, call 9-1-1 or get to the nearest hospital. Otherwise, call your doctor.  Afterward, the reaction should be reported to the Vaccine Adverse Event Reporting System (VAERS). Your doctor might file this report, or you can do it yourself through the VAERS web site at www.vaers.hhs.gov, or by calling 1-800-822-7967.   VAERS does not give medical advice.  6. How can I learn more?  · Ask your doctor. He or she can give you the vaccine package insert or suggest other sources of information.  · Call your local or state health department.  · Contact the Centers for Disease Control and Prevention (CDC):    Call 1-800-232-4636 (1-800-CDC-INFO) or    Visit CDC's website at www.cdc.gov/vaccines  CDC Pneumococcal Polysaccharide Vaccine VIS (07/28/13)     This information is not intended to replace advice given to you by your health care provider. Make sure you discuss any questions you have with your health care provider.     Document Released: 01/18/2006 Document Revised: 04/13/2014 Document Reviewed: 07/31/2013  Elsevier Interactive Patient Education ©2016 [employer]

## 2015-10-31 NOTE — Assessment & Plan Note (Signed)
Room for Improvement discussed diet exercise nutrition we'll avoid increasing medications for now with concern about hyperglycemia

## 2015-10-31 NOTE — Assessment & Plan Note (Signed)
The current medical regimen is effective;  continue present plan and medications.  

## 2015-10-31 NOTE — Progress Notes (Signed)
BP 129/80 (BP Location: Left Arm, Patient Position: Sitting, Cuff Size: Large)   Pulse (!) 54   Temp 97.7 F (36.5 C)   Ht 5\' 3"  (1.6 m)   Wt (!) 324 lb (147 kg)   SpO2 97%   BMI 57.39 kg/m    Subjective:    Patient ID: Anna Juarez, female    DOB: Nov 29, 1950, 65 y.o.   MRN: [id number]  HPI: Anna Juarez is a 65 y.o. female  Annual exam Patient recheck diabetes also noted low blood sugar spells no issues with medications Blood pressure doing okay no complaints from medications Cholesterol doing well also Reflux stable Diabetic neuropathy with numbness in the bottom of her feet helped maybe a little by Lyrica enough to keep taking it has tried Cymbalta have a patent and Lyrica seems to do the best. Will continue  Relevant past medical, surgical, family and social history reviewed and updated as indicated. Interim medical history since our last visit reviewed. Allergies and medications reviewed and updated.  Review of Systems  Constitutional: Negative.   HENT: Negative.   Eyes: Negative.   Respiratory: Negative.   Cardiovascular: Negative.   Gastrointestinal: Negative.   Endocrine: Negative.   Genitourinary: Negative.   Musculoskeletal: Negative.   Skin: Negative.   Allergic/Immunologic: Negative.   Neurological: Negative.   Hematological: Negative.   Psychiatric/Behavioral: Negative.     Per HPI unless specifically indicated above     Objective:    BP 129/80 (BP Location: Left Arm, Patient Position: Sitting, Cuff Size: Large)   Pulse (!) 54   Temp 97.7 F (36.5 C)   Ht 5\' 3"  (1.6 m)   Wt (!) 324 lb (147 kg)   SpO2 97%   BMI 57.39 kg/m   Wt Readings from Last 3 Encounters:  10/31/15 (!) 324 lb (147 kg)  07/09/15 (!) 321 lb (145.6 kg)  03/27/15 (!) 320 lb (145.2 kg)    Physical Exam  Constitutional: She is oriented to person, place, and time. She appears well-developed and well-nourished.  HENT:  Head: Normocephalic and atraumatic.  Right  Ear: External ear normal.  Left Ear: External ear normal.  Nose: Nose normal.  Mouth/Throat: Oropharynx is clear and moist.  Eyes: Conjunctivae and EOM are normal. Pupils are equal, round, and reactive to light.  Neck: Normal range of motion. Neck supple. Carotid bruit is not present.  Cardiovascular: Normal rate, regular rhythm and normal heart sounds.   No murmur heard. Pulmonary/Chest: Effort normal and breath sounds normal. She exhibits no mass. Right breast exhibits no mass, no skin change and no tenderness. Left breast exhibits no mass, no skin change and no tenderness. Breasts are symmetrical.  Abdominal: Soft. Bowel sounds are normal. There is no hepatosplenomegaly.  Musculoskeletal: Normal range of motion.  Neurological: She is alert and oriented to person, place, and time.  Decrease sensation to monofilament testing bottom of her feet  Skin: No rash noted.  Psychiatric: She has a normal mood and affect. Her behavior is normal. Judgment and thought content normal.    Results for orders placed or performed in visit on 07/09/15  Bayer DCA Hb A1c Waived  Result Value Ref Range   Bayer DCA Hb A1c Waived 7.1 (H) <7.0 %      Assessment & Plan:   Problem List Items Addressed This Visit      Cardiovascular and Mediastinum   Essential hypertension    The current medical regimen is effective;  continue present plan and  medications.       Relevant Medications   benazepril (LOTENSIN) 40 MG tablet   colesevelam (WELCHOL) 625 MG tablet   lovastatin (MEVACOR) 40 MG tablet   metoprolol succinate (TOPROL-XL) 50 MG 24 hr tablet     Nervous and Auditory   DM type 2 with diabetic peripheral neuropathy (HCC)    Room for Improvement discussed diet exercise nutrition we'll avoid increasing medications for now with concern about hyperglycemia      Relevant Medications   benazepril (LOTENSIN) 40 MG tablet   Empagliflozin-Linagliptin (GLYXAMBI) 10-5 MG TABS   lovastatin (MEVACOR) 40 MG  tablet   pregabalin (LYRICA) 150 MG capsule     Other   Hyperlipidemia    The current medical regimen is effective;  continue present plan and medications.       Relevant Medications   benazepril (LOTENSIN) 40 MG tablet   colesevelam (WELCHOL) 625 MG tablet   lovastatin (MEVACOR) 40 MG tablet   metoprolol succinate (TOPROL-XL) 50 MG 24 hr tablet   BMI 50.0-59.9, adult (HCC)    Discussed diet exercise nutrition weight loss      Relevant Medications   Empagliflozin-Linagliptin (GLYXAMBI) 10-5 MG TABS    Other Visit Diagnoses    Well adult exam    -  Primary   Relevant Orders   Urinalysis, Routine w reflex microscopic (not at [facility])   CBC with Differential/Platelet   Comprehensive metabolic panel   Lipid Panel w/o Chol/HDL Ratio   TSH   Health care maintenance       Relevant Orders   Hepatitis C Antibody   Diabetes mellitus without complication (HCC)       Relevant Medications   benazepril (LOTENSIN) 40 MG tablet   Empagliflozin-Linagliptin (GLYXAMBI) 10-5 MG TABS   lovastatin (MEVACOR) 40 MG tablet   Other Relevant Orders   Bayer DCA Hb A1c Waived   Need for pneumococcal vaccination       Relevant Orders   Pneumococcal polysaccharide vaccine 23-valent greater than or equal to 2yo subcutaneous/IM (Completed)   Encounter for screening mammogram for breast cancer       Relevant Orders   MM Digital Screening       Follow up plan: Return in about 3 months (around 01/31/2016), or if symptoms worsen or fail to improve, for a1c.

## 2015-10-31 NOTE — Assessment & Plan Note (Signed)
Discussed diet exercise nutrition weight loss 

## 2015-11-01 LAB — COMPREHENSIVE METABOLIC PANEL
A/G RATIO: 1.4 (ref 1.2–2.2)
ALBUMIN: 4.1 g/dL (ref 3.6–4.8)
ALK PHOS: 61 IU/L (ref 39–117)
ALT: 22 IU/L (ref 0–32)
AST: 37 IU/L (ref 0–40)
BILIRUBIN TOTAL: 0.4 mg/dL (ref 0.0–1.2)
BUN/Creatinine Ratio: 12 (ref 12–28)
BUN: 19 mg/dL (ref 8–27)
CHLORIDE: 103 mmol/L (ref 96–106)
CO2: 23 mmol/L (ref 18–29)
Calcium: 9.6 mg/dL (ref 8.7–10.3)
Creatinine, Ser: 1.53 mg/dL — ABNORMAL HIGH (ref 0.57–1.00)
GFR calc non Af Amer: 35 mL/min/{1.73_m2} — ABNORMAL LOW (ref >59)
GFR, EST AFRICAN AMERICAN: 40 mL/min/{1.73_m2} — AB (ref >59)
GLUCOSE: 143 mg/dL — AB (ref 65–99)
Globulin, Total: 3 g/dL (ref 1.5–4.5)
Potassium: 5.2 mmol/L (ref 3.5–5.2)
SODIUM: 144 mmol/L (ref 134–144)
TOTAL PROTEIN: 7.1 g/dL (ref 6.0–8.5)

## 2015-11-01 LAB — CBC WITH DIFFERENTIAL/PLATELET
BASOS ABS: 0 10*3/uL (ref 0.0–0.2)
Basos: 0 %
EOS (ABSOLUTE): 0.2 10*3/uL (ref 0.0–0.4)
Eos: 3 %
Hematocrit: 40.4 % (ref 34.0–46.6)
Hemoglobin: 13 g/dL (ref 11.1–15.9)
IMMATURE GRANULOCYTES: 0 %
Immature Grans (Abs): 0 10*3/uL (ref 0.0–0.1)
Lymphocytes Absolute: 1.8 10*3/uL (ref 0.7–3.1)
Lymphs: 26 %
MCH: 28.6 pg (ref 26.6–33.0)
MCHC: 32.2 g/dL (ref 31.5–35.7)
MCV: 89 fL (ref 79–97)
MONOS ABS: 0.6 10*3/uL (ref 0.1–0.9)
Monocytes: 9 %
NEUTROS PCT: 62 %
Neutrophils Absolute: 4.4 10*3/uL (ref 1.4–7.0)
PLATELETS: 179 10*3/uL (ref 150–379)
RBC: 4.55 x10E6/uL (ref 3.77–5.28)
RDW: 15 % (ref 12.3–15.4)
WBC: 7.1 10*3/uL (ref 3.4–10.8)

## 2015-11-01 LAB — LIPID PANEL W/O CHOL/HDL RATIO
CHOLESTEROL TOTAL: 143 mg/dL (ref 100–199)
HDL: 48 mg/dL (ref >39)
LDL Calculated: 53 mg/dL (ref 0–99)
TRIGLYCERIDES: 209 mg/dL — AB (ref 0–149)
VLDL Cholesterol Cal: 42 mg/dL — ABNORMAL HIGH (ref 5–40)

## 2015-11-01 LAB — HEPATITIS C ANTIBODY: Hep C Virus Ab: <0.1 s/co ratio (ref 0.0–0.9)

## 2015-11-01 LAB — TSH: TSH: 2.32 u[IU]/mL (ref 0.450–4.500)

## 2015-11-04 ENCOUNTER — Encounter: Payer: Self-pay | Admitting: Family Medicine

## 2015-11-07 ENCOUNTER — Other Ambulatory Visit: Payer: Self-pay | Admitting: Family Medicine

## 2015-11-07 ENCOUNTER — Ambulatory Visit
Admission: RE | Admit: 2015-11-07 | Discharge: 2015-11-07 | Disposition: A | Discharge status: Home / Self Care | Payer: Medicare Other | Source: Ambulatory Visit | Attending: Family Medicine | Admitting: Family Medicine

## 2015-11-07 DIAGNOSIS — Z1231 Encounter for screening mammogram for malignant neoplasm of breast: Secondary | ICD-10-CM

## 2015-11-17 ENCOUNTER — Other Ambulatory Visit: Payer: Self-pay | Admitting: Family Medicine

## 2015-11-17 DIAGNOSIS — E1142 Type 2 diabetes mellitus with diabetic polyneuropathy: Secondary | ICD-10-CM

## 2015-11-25 ENCOUNTER — Other Ambulatory Visit: Payer: Self-pay | Admitting: Family Medicine

## 2015-11-25 DIAGNOSIS — I10 Essential (primary) hypertension: Secondary | ICD-10-CM

## 2016-01-07 ENCOUNTER — Ambulatory Visit (INDEPENDENT_AMBULATORY_CARE_PROVIDER_SITE_OTHER): Payer: Medicare Other

## 2016-01-07 DIAGNOSIS — Z23 Encounter for immunization: Secondary | ICD-10-CM

## 2016-02-05 LAB — HM DIABETES EYE EXAM

## 2016-02-10 ENCOUNTER — Ambulatory Visit: Payer: Medicare Other | Admitting: Family Medicine

## 2016-02-11 ENCOUNTER — Ambulatory Visit (INDEPENDENT_AMBULATORY_CARE_PROVIDER_SITE_OTHER): Payer: Medicare Other | Admitting: Family Medicine

## 2016-02-11 ENCOUNTER — Encounter: Payer: Self-pay | Admitting: Family Medicine

## 2016-02-11 VITALS — BP 116/70 | HR 54 | Temp 98.2°F | Wt 314.0 lb

## 2016-02-11 DIAGNOSIS — E119 Type 2 diabetes mellitus without complications: Secondary | ICD-10-CM | POA: Diagnosis not present

## 2016-02-11 DIAGNOSIS — I10 Essential (primary) hypertension: Secondary | ICD-10-CM

## 2016-02-11 DIAGNOSIS — E1142 Type 2 diabetes mellitus with diabetic polyneuropathy: Secondary | ICD-10-CM

## 2016-02-11 LAB — BAYER DCA HB A1C WAIVED: HB A1C (BAYER DCA - WAIVED): 7.4 % — ABNORMAL HIGH (ref <7.0)

## 2016-02-11 NOTE — Assessment & Plan Note (Signed)
The current medical regimen is effective;  continue present plan and medications.  

## 2016-02-11 NOTE — Progress Notes (Signed)
   BP 116/70   Pulse (!) 54   Temp 98.2 F (36.8 C)   Wt (!) 314 lb (142.4 kg)   SpO2 98%   BMI 55.62 kg/m    Subjective:    Patient ID: Anna Juarez, female    DOB: [birth year], 65 y.o.   MRN: [id number]  HPI: [name] is a 65 y.o. female  Chief Complaint  Patient presents with  . Diabetes   Patient doing well noted low blood sugar spells no complaints from medications taking medications faithfully Blood pressure good control no issues Biggest complaint today is his stomach gets full and stays full for a long time has had big workup to gastroenterology with no formal diagnosis other than most likely gastroparesis symptoms.  Relevant past medical, surgical, family and social history reviewed and updated as indicated. Interim medical history since our last visit reviewed. Allergies and medications reviewed and updated.  Review of Systems  Constitutional: Negative.   Respiratory: Negative.   Cardiovascular: Negative.     Per HPI unless specifically indicated above     Objective:    BP 116/70   Pulse (!) 54   Temp 98.2 F (36.8 C)   Wt (!) 314 lb (142.4 kg)   SpO2 98%   BMI 55.62 kg/m   Wt Readings from Last 3 Encounters:  02/11/16 (!) 314 lb (142.4 kg)  10/31/15 (!) 324 lb (147 kg)  07/09/15 (!) 321 lb (145.6 kg)    Physical Exam  Constitutional: She is oriented to person, place, and time. She appears well-developed and well-nourished. No distress.  HENT:  Head: Normocephalic and atraumatic.  Right Ear: Hearing normal.  Left Ear: Hearing normal.  Nose: Nose normal.  Eyes: Conjunctivae and lids are normal. Right eye exhibits no discharge. Left eye exhibits no discharge. No scleral icterus.  Cardiovascular: Normal rate, regular rhythm and normal heart sounds.   Pulmonary/Chest: Effort normal. No respiratory distress.  Musculoskeletal: Normal range of motion.  Neurological: She is alert and oriented to person, place, and time.  Skin: Skin is  intact. No rash noted.  Psychiatric: She has a normal mood and affect. Her speech is normal and behavior is normal. Judgment and thought content normal. Cognition and memory are normal.    Results for orders placed or performed in visit on 02/10/16  HM DIABETES EYE EXAM  Result Value Ref Range   HM Diabetic Eye Exam No Retinopathy No Retinopathy      Assessment & Plan:   Problem List Items Addressed This Visit      Cardiovascular and Mediastinum   Essential hypertension    The current medical regimen is effective;  continue present plan and medications.         Endocrine   DM type 2 with diabetic peripheral neuropathy ([place])    Continued poor control discuss better control with diet exercise nutrition Discuss gastroparesis control with eating smaller amounts and spread out further which will also help diabetic control and weight loss.       Other Visit Diagnoses    Diabetes mellitus without complication ([place])    -  Primary   Relevant Orders   Bayer DCA Hb A1c Waived      Discussed for patient's back pain not to take Advil or Aleve because of her renal status Follow up plan: Return in about 3 months (around 05/13/2016) for BMP,  Lipids, ALT, AST, Hemoglobin A1c.

## 2016-02-11 NOTE — Assessment & Plan Note (Signed)
Continued poor control discuss better control with diet exercise nutrition Discuss gastroparesis control with eating smaller amounts and spread out further which will also help diabetic control and weight loss.

## 2016-03-11 ENCOUNTER — Telehealth: Payer: Self-pay | Admitting: Family Medicine

## 2016-03-11 MED ORDER — EMPAGLIFLOZIN 25 MG PO TABS: 25.0000 mg | ORAL_TABLET | Freq: Every day | ORAL | 3 refills | Status: DC

## 2016-03-11 NOTE — Telephone Encounter (Signed)
Call pt 

## 2016-03-11 NOTE — Telephone Encounter (Signed)
Routing to provider  

## 2016-03-11 NOTE — Telephone Encounter (Signed)
Phone call Discussed with patient having nausea on Glyxamby Will discontinue combination and begin [place]

## 2016-05-13 ENCOUNTER — Ambulatory Visit: Payer: Medicare Other | Admitting: Family Medicine

## 2016-05-14 ENCOUNTER — Ambulatory Visit: Payer: Medicare Other | Admitting: Family Medicine

## 2016-05-25 ENCOUNTER — Ambulatory Visit (INDEPENDENT_AMBULATORY_CARE_PROVIDER_SITE_OTHER): Payer: Medicare Other | Admitting: Family Medicine

## 2016-05-25 ENCOUNTER — Encounter: Payer: Self-pay | Admitting: Family Medicine

## 2016-05-25 VITALS — BP 104/67 | HR 63 | Ht 63.0 in | Wt 320.0 lb

## 2016-05-25 DIAGNOSIS — E1142 Type 2 diabetes mellitus with diabetic polyneuropathy: Secondary | ICD-10-CM | POA: Diagnosis not present

## 2016-05-25 DIAGNOSIS — I10 Essential (primary) hypertension: Secondary | ICD-10-CM

## 2016-05-25 DIAGNOSIS — E785 Hyperlipidemia, unspecified: Secondary | ICD-10-CM

## 2016-05-25 DIAGNOSIS — Z6841 Body Mass Index (BMI) 40.0 and over, adult: Secondary | ICD-10-CM | POA: Diagnosis not present

## 2016-05-25 LAB — BAYER DCA HB A1C WAIVED: HB A1C: 8 % — AB (ref <7.0)

## 2016-05-25 MED ORDER — PREGABALIN 150 MG PO CAPS: 150.0000 mg | ORAL_CAPSULE | Freq: Three times a day (TID) | ORAL | 1 refills | Status: DC

## 2016-05-25 MED ORDER — SITAGLIPTIN PHOSPHATE 100 MG PO TABS: 100.0000 mg | ORAL_TABLET | Freq: Every day | ORAL | 3 refills | Status: DC

## 2016-05-25 NOTE — Assessment & Plan Note (Signed)
The current medical regimen is effective;  continue present plan and medications.  

## 2016-05-25 NOTE — Progress Notes (Signed)
   BP 104/67   Pulse 63   Ht 5\' 3"  (1.6 m)   Wt (!) 320 lb (145.2 kg)   SpO2 99%   BMI 56.69 kg/m    Subjective:    Patient ID: Anna Juarez, female    DOB: 1951-02-27, 66 y.o.   MRN: [id number]  HPI: Anna Juarez is a 66 y.o. female  Chief Complaint  Patient presents with  . Follow-up   Patient's stomach is gotten better from above medications which were causing some upset stomach but weight is gone up and worsening control of diabetes. Takes cholesterol medicines and blood pressure medicines without problems and good control.  Relevant past medical, surgical, family and social history reviewed and updated as indicated. Interim medical history since our last visit reviewed. Allergies and medications reviewed and updated.  Review of Systems  Constitutional: Negative.   Respiratory: Negative.   Cardiovascular: Negative.     Per HPI unless specifically indicated above     Objective:    BP 104/67   Pulse 63   Ht 5\' 3"  (1.6 m)   Wt (!) 320 lb (145.2 kg)   SpO2 99%   BMI 56.69 kg/m   Wt Readings from Last 3 Encounters:  05/25/16 (!) 320 lb (145.2 kg)  02/11/16 (!) 314 lb (142.4 kg)  10/31/15 (!) 324 lb (147 kg)    Physical Exam  Results for orders placed or performed in visit on 02/11/16  Bayer DCA Hb A1c Waived  Result Value Ref Range   Bayer DCA Hb A1c Waived 7.4 (H) <7.0 %      Assessment & Plan:   Problem List Items Addressed This Visit      Cardiovascular and Mediastinum   Essential hypertension - Primary    The current medical regimen is effective;  continue present plan and medications.       Relevant Orders   Basic metabolic panel   Bayer DCA Hb A1c Waived   LP+ALT+AST Piccolo, [place]     Endocrine   DM type 2 with diabetic peripheral neuropathy ([place])    Poor control with side effects from medications will continue Jardiance add Januvia patient education given.      Relevant Medications   pregabalin (LYRICA) 150 MG capsule   sitaGLIPtin (JANUVIA) 100 MG tablet   Other Relevant Orders   Basic metabolic panel   Bayer DCA Hb A1c Waived   LP+ALT+AST Piccolo, Waived     Other   Hyperlipidemia    The current medical regimen is effective;  continue present plan and medications.       Relevant Orders   Basic metabolic panel   Bayer DCA Hb A1c Waived   LP+ALT+AST Piccolo, Waived   BMI 50.0-59.9, adult [facility])    Discussed diet exercise nutrition      Relevant Medications   sitaGLIPtin (JANUVIA) 100 MG tablet       Follow up plan: Return in about 3 months (around 08/22/2016) for Hemoglobin A1c.

## 2016-05-25 NOTE — Assessment & Plan Note (Signed)
Poor control with side effects from medications will continue Jardiance add Januvia patient education given.

## 2016-05-25 NOTE — Assessment & Plan Note (Signed)
Discussed diet exercise nutrition 

## 2016-05-26 ENCOUNTER — Encounter: Payer: Self-pay | Admitting: Family Medicine

## 2016-05-26 LAB — BASIC METABOLIC PANEL
BUN/Creatinine Ratio: 16 (ref 12–28)
BUN: 25 mg/dL (ref 8–27)
CALCIUM: 9.4 mg/dL (ref 8.7–10.3)
CO2: 21 mmol/L (ref 18–29)
CREATININE: 1.61 mg/dL — AB (ref 0.57–1.00)
Chloride: 102 mmol/L (ref 96–106)
GFR, EST AFRICAN AMERICAN: 38 mL/min/{1.73_m2} — AB (ref >59)
GFR, EST NON AFRICAN AMERICAN: 33 mL/min/{1.73_m2} — AB (ref >59)
Glucose: 145 mg/dL — ABNORMAL HIGH (ref 65–99)
Potassium: 4.7 mmol/L (ref 3.5–5.2)
Sodium: 140 mmol/L (ref 134–144)

## 2016-06-29 ENCOUNTER — Other Ambulatory Visit: Payer: Self-pay | Admitting: Family Medicine

## 2016-06-29 NOTE — Telephone Encounter (Signed)
Last OV: 05/24/16 Next OV: 08/24/16    Last A1C was 05/24/16 8.0%

## 2016-07-22 ENCOUNTER — Encounter: Payer: Self-pay | Admitting: Family Medicine

## 2016-07-22 ENCOUNTER — Ambulatory Visit (INDEPENDENT_AMBULATORY_CARE_PROVIDER_SITE_OTHER): Payer: Medicare Other | Admitting: Family Medicine

## 2016-07-22 ENCOUNTER — Ambulatory Visit
Admission: RE | Admit: 2016-07-22 | Discharge: 2016-07-22 | Disposition: A | Discharge status: Home / Self Care | Payer: Medicare Other | Source: Ambulatory Visit | Attending: Family Medicine | Admitting: Family Medicine

## 2016-07-22 VITALS — BP 139/85 | HR 63 | Wt 316.0 lb

## 2016-07-22 DIAGNOSIS — G8929 Other chronic pain: Secondary | ICD-10-CM | POA: Insufficient documentation

## 2016-07-22 DIAGNOSIS — T391X1A Poisoning by 4-Aminophenol derivatives, accidental (unintentional), initial encounter: Secondary | ICD-10-CM | POA: Insufficient documentation

## 2016-07-22 DIAGNOSIS — I10 Essential (primary) hypertension: Secondary | ICD-10-CM | POA: Diagnosis not present

## 2016-07-22 DIAGNOSIS — M545 Low back pain, unspecified: Secondary | ICD-10-CM

## 2016-07-22 DIAGNOSIS — M5136 Other intervertebral disc degeneration, lumbar region: Secondary | ICD-10-CM | POA: Insufficient documentation

## 2016-07-22 DIAGNOSIS — M8938 Hypertrophy of bone, other site: Secondary | ICD-10-CM | POA: Insufficient documentation

## 2016-07-22 DIAGNOSIS — M549 Dorsalgia, unspecified: Secondary | ICD-10-CM | POA: Insufficient documentation

## 2016-07-22 LAB — MICROSCOPIC EXAMINATION
Renal Epithel, UA: NONE SEEN /hpf
WBC UA: NONE SEEN /HPF (ref 0–?)

## 2016-07-22 LAB — URINALYSIS, ROUTINE W REFLEX MICROSCOPIC
BILIRUBIN UA: NEGATIVE
Ketones, UA: NEGATIVE
Leukocytes, UA: NEGATIVE
Nitrite, UA: NEGATIVE
PROTEIN UA: NEGATIVE
SPEC GRAV UA: 1.01 (ref 1.005–1.030)
UUROB: 0.2 mg/dL (ref 0.2–1.0)
pH, UA: 5 (ref 5.0–7.5)

## 2016-07-22 MED ORDER — TRAMADOL HCL 50 MG PO TABS
50.0000 mg | ORAL_TABLET | Freq: Three times a day (TID) | ORAL | 1 refills | Status: DC | PRN
Start: 2016-07-22 — End: 2016-08-24

## 2016-07-22 NOTE — Assessment & Plan Note (Signed)
Back pain most likely from physical activity caring for her husband with a lot of lifting pulling bending. We will get x-ray. Urinalysis clear

## 2016-07-22 NOTE — Assessment & Plan Note (Signed)
The current medical regimen is effective;  continue present plan and medications.  

## 2016-07-22 NOTE — Assessment & Plan Note (Signed)
Patient overdosing on Tylenol in an attempt to get some relief of her back pain. Discussed having to stop Tylenol for now will switch to tramadol with cautions about controlled drugs. We will get back x-ray also and check CMP with attention to liver function.

## 2016-07-22 NOTE — Progress Notes (Signed)
BP 139/85   Pulse 63   Wt (!) 316 lb (143.3 kg)   SpO2 99%   BMI 55.98 kg/m    Subjective:    Patient ID: Anna Juarez, female    DOB: 10-22-1950, 66 y.o.   MRN: [id number]  HPI: Anna Juarez is a 66 y.o. female  Back pelvic pain She with low back pain and suprapubic pain no real dysuria frequency urgency no blood in stool or urine this pains been ongoing for 2 weeks but has been ongoing off and on for 2 months but was eased by Tylenol at that time now it's not. Has been taking almost a bottle of Tylenol every week or so with 100 tablets. Symptoms also worsened more with radiation into right hip with right upper body pivot.   Relevant past medical, surgical, family and social history reviewed and updated as indicated. Interim medical history since our last visit reviewed. Allergies and medications reviewed and updated.  Review of Systems  Constitutional: Negative.   Respiratory: Negative.   Cardiovascular: Negative.     Per HPI unless specifically indicated above     Objective:    BP 139/85   Pulse 63   Wt (!) 316 lb (143.3 kg)   SpO2 99%   BMI 55.98 kg/m   Wt Readings from Last 3 Encounters:  07/22/16 (!) 316 lb (143.3 kg)  05/25/16 (!) 320 lb (145.2 kg)  02/11/16 (!) 314 lb (142.4 kg)    Physical Exam  Constitutional: She is oriented to person, place, and time. She appears well-developed and well-nourished.  HENT:  Head: Normocephalic and atraumatic.  Eyes: Conjunctivae and EOM are normal.  Neck: Normal range of motion.  Cardiovascular: Normal rate, regular rhythm and normal heart sounds.   Pulmonary/Chest: Effort normal and breath sounds normal.  Musculoskeletal: Normal range of motion.  Patient massively obese difficult exam but no evidence of straight leg raising pain on left raises no back rash. Otherwise clear  Neurological: She is alert and oriented to person, place, and time.  Skin: No erythema.  Psychiatric: She has a normal mood and  affect. Her behavior is normal. Judgment and thought content normal.    Results for orders placed or performed in visit on 07/22/16  Microscopic Examination  Result Value Ref Range   WBC, UA None seen 0 - 5 /hpf   RBC, UA 0-2 0 - 2 /hpf   Epithelial Cells (non renal) 0-10 0 - 10 /hpf   Renal Epithel, UA None seen None seen /hpf   Bacteria, UA Few None seen/Few  Urinalysis, Routine w reflex microscopic  Result Value Ref Range   Specific Gravity, UA 1.010 1.005 - 1.030   pH, UA 5.0 5.0 - 7.5   Color, UA Yellow Yellow   Appearance Ur Clear Clear   Leukocytes, UA Negative Negative   Protein, UA Negative Negative/Trace   Glucose, UA 3+ (A) Negative   Ketones, UA Negative Negative   RBC, UA Trace (A) Negative   Bilirubin, UA Negative Negative   Urobilinogen, Ur 0.2 0.2 - 1.0 mg/dL   Nitrite, UA Negative Negative   Microscopic Examination See below:       Assessment & Plan:   Problem List Items Addressed This Visit      Cardiovascular and Mediastinum   Essential hypertension - Primary    The current medical regimen is effective;  continue present plan and medications.       Relevant Orders   Comp Met (CMET)  Other   Back pain    Back pain most likely from physical activity caring for her husband with a lot of lifting pulling bending. We will get x-ray. Urinalysis clear      Relevant Medications   traMADol (ULTRAM) 50 MG tablet   Other Relevant Orders   Urinalysis, Routine w reflex microscopic (Completed)   DG Lumbar Spine 2-3 Views   Comp Met (CMET)   Tylenol overdose    Patient overdosing on Tylenol in an attempt to get some relief of her back pain. Discussed having to stop Tylenol for now will switch to tramadol with cautions about controlled drugs. We will get back x-ray also and check CMP with attention to liver function.      Relevant Orders   Comp Met (CMET)       Follow up plan: Return for As scheduled.

## 2016-07-23 LAB — COMPREHENSIVE METABOLIC PANEL
ALK PHOS: 51 IU/L (ref 39–117)
ALT: 24 IU/L (ref 0–32)
AST: 28 IU/L (ref 0–40)
Albumin/Globulin Ratio: 1.5 (ref 1.2–2.2)
Albumin: 4.6 g/dL (ref 3.6–4.8)
BUN / CREAT RATIO: 15 (ref 12–28)
BUN: 21 mg/dL (ref 8–27)
Bilirubin Total: 0.4 mg/dL (ref 0.0–1.2)
CHLORIDE: 99 mmol/L (ref 96–106)
CO2: 26 mmol/L (ref 18–29)
CREATININE: 1.4 mg/dL — AB (ref 0.57–1.00)
Calcium: 9.6 mg/dL (ref 8.7–10.3)
GFR calc non Af Amer: 39 mL/min/{1.73_m2} — ABNORMAL LOW (ref >59)
GFR, EST AFRICAN AMERICAN: 45 mL/min/{1.73_m2} — AB (ref >59)
GLOBULIN, TOTAL: 3.1 g/dL (ref 1.5–4.5)
GLUCOSE: 139 mg/dL — AB (ref 65–99)
POTASSIUM: 4.6 mmol/L (ref 3.5–5.2)
Sodium: 142 mmol/L (ref 134–144)
Total Protein: 7.7 g/dL (ref 6.0–8.5)

## 2016-07-27 ENCOUNTER — Encounter: Payer: Self-pay | Admitting: Family Medicine

## 2016-08-24 ENCOUNTER — Encounter: Payer: Self-pay | Admitting: Family Medicine

## 2016-08-24 ENCOUNTER — Ambulatory Visit (INDEPENDENT_AMBULATORY_CARE_PROVIDER_SITE_OTHER): Payer: Medicare Other | Admitting: Family Medicine

## 2016-08-24 VITALS — BP 122/76 | HR 54 | Wt 312.0 lb

## 2016-08-24 DIAGNOSIS — E785 Hyperlipidemia, unspecified: Secondary | ICD-10-CM

## 2016-08-24 DIAGNOSIS — I10 Essential (primary) hypertension: Secondary | ICD-10-CM | POA: Diagnosis not present

## 2016-08-24 DIAGNOSIS — M545 Low back pain: Secondary | ICD-10-CM | POA: Diagnosis not present

## 2016-08-24 DIAGNOSIS — E1142 Type 2 diabetes mellitus with diabetic polyneuropathy: Secondary | ICD-10-CM

## 2016-08-24 DIAGNOSIS — G8929 Other chronic pain: Secondary | ICD-10-CM | POA: Diagnosis not present

## 2016-08-24 LAB — BAYER DCA HB A1C WAIVED: HB A1C (BAYER DCA - WAIVED): 7 % — ABNORMAL HIGH (ref <7.0)

## 2016-08-24 MED ORDER — SITAGLIPTIN PHOSPHATE 100 MG PO TABS: 100.0000 mg | ORAL_TABLET | Freq: Every day | ORAL | 7 refills | Status: DC

## 2016-08-24 MED ORDER — TRAMADOL HCL 50 MG PO TABS: 50.0000 mg | ORAL_TABLET | Freq: Three times a day (TID) | ORAL | 1 refills | Status: DC | PRN

## 2016-08-24 NOTE — Assessment & Plan Note (Signed)
The current medical regimen is effective;  continue present plan and medications.  

## 2016-08-24 NOTE — Progress Notes (Signed)
BP 122/76   Pulse (!) 54   Wt (!) 312 lb (141.5 kg)   BMI 55.27 kg/m    Subjective:    Patient ID: Anna Juarez, female    DOB: June 10, 1950, 66 y.o.   MRN: [id number]  HPI: Anna Juarez is a 66 y.o. female  Chief Complaint  Patient presents with  . Follow-up  . Back Pain   Patient follow-up diabetes doing well with a pounds weight loss taking Jardiance noted low blood sugar spells. One of the reasons that the patient's been losing weight because her back hurts and came do much including cook. All in all doing well from diabetes point of view back pain worse in the early morning takes pain pills and then disabled working out and go mostly. No real leg radicular symptoms does seem to bother her right abdomen area.. No bowel or bladder issues.  Relevant past medical, surgical, family and social history reviewed and updated as indicated. Interim medical history since our last visit reviewed. Allergies and medications reviewed and updated.  Review of Systems  Constitutional: Negative.   Respiratory: Negative.   Cardiovascular: Negative.     Per HPI unless specifically indicated above     Objective:    BP 122/76   Pulse (!) 54   Wt (!) 312 lb (141.5 kg)   BMI 55.27 kg/m   Wt Readings from Last 3 Encounters:  08/24/16 (!) 312 lb (141.5 kg)  07/22/16 (!) 316 lb (143.3 kg)  05/25/16 (!) 320 lb (145.2 kg)    Physical Exam  Constitutional: She is oriented to person, place, and time. She appears well-developed and well-nourished.  HENT:  Head: Normocephalic and atraumatic.  Eyes: Conjunctivae and EOM are normal.  Neck: Normal range of motion.  Cardiovascular: Normal rate, regular rhythm and normal heart sounds.   Pulmonary/Chest: Effort normal and breath sounds normal.  Musculoskeletal: Normal range of motion.  Neurological: She is alert and oriented to person, place, and time.  Skin: No erythema.  Psychiatric: She has a normal mood and affect. Her behavior is  normal. Judgment and thought content normal.    Results for orders placed or performed in visit on 07/22/16  Microscopic Examination  Result Value Ref Range   WBC, UA None seen 0 - 5 /hpf   RBC, UA 0-2 0 - 2 /hpf   Epithelial Cells (non renal) 0-10 0 - 10 /hpf   Renal Epithel, UA None seen None seen /hpf   Bacteria, UA Few None seen/Few  Urinalysis, Routine w reflex microscopic  Result Value Ref Range   Specific Gravity, UA 1.010 1.005 - 1.030   pH, UA 5.0 5.0 - 7.5   Color, UA Yellow Yellow   Appearance Ur Clear Clear   Leukocytes, UA Negative Negative   Protein, UA Negative Negative/Trace   Glucose, UA 3+ (A) Negative   Ketones, UA Negative Negative   RBC, UA Trace (A) Negative   Bilirubin, UA Negative Negative   Urobilinogen, Ur 0.2 0.2 - 1.0 mg/dL   Nitrite, UA Negative Negative   Microscopic Examination See below:   Comp Met (CMET)  Result Value Ref Range   Glucose 139 (H) 65 - 99 mg/dL   BUN 21 8 - 27 mg/dL   Creatinine, Ser 1.40 (H) 0.57 - 1.00 mg/dL   GFR calc non Af Amer 39 (L) >59 mL/min/1.73   GFR calc Af Amer 45 (L) >59 mL/min/1.73   BUN/Creatinine Ratio 15 12 - 28   Sodium  142 134 - 144 mmol/L   Potassium 4.6 3.5 - 5.2 mmol/L   Chloride 99 96 - 106 mmol/L   CO2 26 18 - 29 mmol/L   Calcium 9.6 8.7 - 10.3 mg/dL   Total Protein 7.7 6.0 - 8.5 g/dL   Albumin 4.6 3.6 - 4.8 g/dL   Globulin, Total 3.1 1.5 - 4.5 g/dL   Albumin/Globulin Ratio 1.5 1.2 - 2.2   Bilirubin Total 0.4 0.0 - 1.2 mg/dL   Alkaline Phosphatase 51 39 - 117 IU/L   AST 28 0 - 40 IU/L   ALT 24 0 - 32 IU/L      Assessment & Plan:   Problem List Items Addressed This Visit      Cardiovascular and Mediastinum   Essential hypertension    The current medical regimen is effective;  continue present plan and medications.       Relevant Orders   Bayer DCA Hb A1c Waived     Endocrine   DM type 2 with diabetic peripheral neuropathy ([place]) - Primary    The current medical regimen is effective;   continue present plan and medications.       Relevant Medications   sitaGLIPtin (JANUVIA) 100 MG tablet   Other Relevant Orders   Bayer DCA Hb A1c Waived     Other   Hyperlipidemia    The current medical regimen is effective;  continue present plan and medications.       Relevant Orders   Bayer DCA Hb A1c Waived   Back pain   Relevant Medications   traMADol (ULTRAM) 50 MG tablet       Follow up plan: Return in about 3 months (around 11/24/2016) for Physical Exam, Hemoglobin A1c.

## 2016-10-31 ENCOUNTER — Other Ambulatory Visit: Payer: Self-pay | Admitting: Family Medicine

## 2016-11-02 NOTE — Telephone Encounter (Signed)
Last OV: 08/24/16 Next OV: 11/24/16   No results found for: HGBA1C

## 2016-11-05 ENCOUNTER — Other Ambulatory Visit: Payer: Self-pay | Admitting: Family Medicine

## 2016-11-12 ENCOUNTER — Other Ambulatory Visit: Payer: Self-pay | Admitting: Family Medicine

## 2016-11-12 DIAGNOSIS — E785 Hyperlipidemia, unspecified: Secondary | ICD-10-CM

## 2016-11-12 DIAGNOSIS — I10 Essential (primary) hypertension: Secondary | ICD-10-CM

## 2016-11-12 NOTE — Telephone Encounter (Signed)
Last OV: 07/22/16 Next OV: 11/24/16  BMP Latest Ref Rng & Units 07/22/2016 05/25/2016 10/31/2015  Glucose 65 - 99 mg/dL 139(H) 145(H) 143(H)  BUN 8 - 27 mg/dL 21 25 19   Creatinine 0.57 - 1.00 mg/dL 1.40(H) 1.61(H) 1.53(H)  BUN/Creat Ratio 12 - 28 15 16 12   Sodium 134 - 144 mmol/L 142 140 144  Potassium 3.5 - 5.2 mmol/L 4.6 4.7 5.2  Chloride 96 - 106 mmol/L 99 102 103  CO2 18 - 29 mmol/L 26 21 23   Calcium 8.7 - 10.3 mg/dL 9.6 9.4 9.6

## 2016-11-16 ENCOUNTER — Encounter: Payer: Self-pay | Admitting: Family Medicine

## 2016-11-16 ENCOUNTER — Other Ambulatory Visit: Payer: Self-pay | Admitting: Family Medicine

## 2016-11-16 ENCOUNTER — Ambulatory Visit (INDEPENDENT_AMBULATORY_CARE_PROVIDER_SITE_OTHER): Payer: Medicare Other | Admitting: Family Medicine

## 2016-11-16 VITALS — BP 138/76 | HR 62 | Temp 97.8°F | Wt 309.0 lb

## 2016-11-16 DIAGNOSIS — B372 Candidiasis of skin and nail: Secondary | ICD-10-CM | POA: Diagnosis not present

## 2016-11-16 DIAGNOSIS — G8929 Other chronic pain: Secondary | ICD-10-CM | POA: Diagnosis not present

## 2016-11-16 DIAGNOSIS — M5441 Lumbago with sciatica, right side: Secondary | ICD-10-CM | POA: Diagnosis not present

## 2016-11-16 DIAGNOSIS — R350 Frequency of micturition: Secondary | ICD-10-CM

## 2016-11-16 MED ORDER — PREDNISONE 20 MG PO TABS: 40.0000 mg | ORAL_TABLET | Freq: Every day | ORAL | 0 refills | Status: DC

## 2016-11-16 MED ORDER — NYSTATIN 100000 UNIT/GM EX CREA: 1.0000 "application " | TOPICAL_CREAM | Freq: Two times a day (BID) | CUTANEOUS | 0 refills | Status: DC

## 2016-11-16 NOTE — Progress Notes (Signed)
BP 138/76   Pulse 62   Temp 97.8 F (36.6 C)   Wt (!) 309 lb (140.2 kg)   SpO2 98%   BMI 54.74 kg/m    Subjective:    Patient ID: Anna Juarez, female    DOB: Apr 13, 1950, 66 y.o.   MRN: [id number]  HPI: Anna Juarez is a 66 y.o. female  Chief Complaint  Patient presents with  . Urinary Tract Infection    last week had lower abd/pelvic pain, Friday pain increased and would go into her back.  No burning but feels like her urine is hot. Frequency and urgency, pressure.    Patient presents with almost a week of right low back aching going down toward pelvis. Feels like her urine is hot, but no dysuria. Does have some frequency, urgency, and pelvic pressure. States the back pain is sharp and radiating. Hx of right low back issues with radiation. Seems worse with bending and walking. Denies fever, chills, N/V/D, CP, SOB. Has been using her tramadol with relief, no other OTC remedies tried.   Also struggling with a rash under her abdominal skin folds. Daughter has been applying athlete's foot cream with some relief. Area itches and is slightly sore.   Past Medical History:  Diagnosis Date  . Chronic kidney disease   . Extreme obesity   . Fatty liver   . Hyperlipidemia   . Radiculopathy, cervical    Social History   Social History  . Marital status: Married    Spouse name: N/A  . Number of children: N/A  . Years of education: N/A   Occupational History  . Not on file.   Social History Main Topics  . Smoking status: Never Smoker  . Smokeless tobacco: Never Used  . Alcohol use No  . Drug use: No  . Sexual activity: Not on file   Other Topics Concern  . Not on file   Social History Narrative  . No narrative on file   Relevant past medical, surgical, family and social history reviewed and updated as indicated. Interim medical history since our last visit reviewed. Allergies and medications reviewed and updated.  Review of Systems  Constitutional: Negative.    HENT: Negative.   Respiratory: Negative.   Cardiovascular: Negative.   Gastrointestinal: Negative.   Genitourinary: Positive for frequency, pelvic pain and urgency.  Musculoskeletal: Positive for back pain.  Neurological: Negative.   Psychiatric/Behavioral: Negative.    Per HPI unless specifically indicated above     Objective:    BP 138/76   Pulse 62   Temp 97.8 F (36.6 C)   Wt (!) 309 lb (140.2 kg)   SpO2 98%   BMI 54.74 kg/m   Wt Readings from Last 3 Encounters:  11/16/16 (!) 309 lb (140.2 kg)  08/24/16 (!) 312 lb (141.5 kg)  07/22/16 (!) 316 lb (143.3 kg)    Physical Exam  Constitutional: She is oriented to person, place, and time. She appears well-developed and well-nourished. No distress.  HENT:  Head: Atraumatic.  Eyes: Pupils are equal, round, and reactive to light. Conjunctivae are normal. No scleral icterus.  Neck: Normal range of motion. Neck supple.  Cardiovascular: Normal rate and normal heart sounds.   Pulmonary/Chest: Effort normal and breath sounds normal. No respiratory distress.  Abdominal: Soft. Bowel sounds are normal. She exhibits no distension. There is no tenderness.  Musculoskeletal: She exhibits tenderness (Mild tenderness to deep palpation right low back). She exhibits no edema or deformity.  Antalgic  gait   Lymphadenopathy:    She has no cervical adenopathy.  Neurological: She is alert and oriented to person, place, and time.  Skin: Skin is warm and dry.  Psychiatric: She has a normal mood and affect. Her behavior is normal.  Nursing note and vitals reviewed.     Assessment & Plan:   Problem List Items Addressed This Visit      Other   Back pain - Primary   Relevant Medications   predniSONE (DELTASONE) 20 MG tablet    Other Visit Diagnoses    Urinary frequency       Relevant Orders   UA/M w/rflx Culture, Routine (STAT) (Completed)   Microscopic Examination (Completed)   Urine Culture, Reflex (Completed)   Cutaneous  candidiasis       Nystatin cream sent. Use powders to keep areas dry. F/u if no improvement   Relevant Medications   nystatin cream (MYCOSTATIN)    U/A with no evidence of UTI, and duration not typical for kidney stones. 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 Will treat with prednisone, ortho referral if no improvement   Follow up plan: Return for as scheduled.

## 2016-11-18 LAB — UA/M W/RFLX CULTURE, ROUTINE
Bilirubin, UA: NEGATIVE
Ketones, UA: NEGATIVE
LEUKOCYTES UA: NEGATIVE
Nitrite, UA: NEGATIVE
PH UA: 5 (ref 5.0–7.5)
Protein, UA: NEGATIVE
RBC UA: NEGATIVE
Specific Gravity, UA: 1.01 (ref 1.005–1.030)
UUROB: 0.2 mg/dL (ref 0.2–1.0)

## 2016-11-18 LAB — MICROSCOPIC EXAMINATION
RBC, UA: NONE SEEN /hpf (ref 0–?)
WBC, UA: NONE SEEN /hpf (ref 0–?)

## 2016-11-18 LAB — URINE CULTURE, REFLEX

## 2016-11-19 ENCOUNTER — Telehealth: Payer: Self-pay | Admitting: Family Medicine

## 2016-11-19 NOTE — Telephone Encounter (Signed)
Please call and let her know that I spoke with Dr. Jeananne Rama about her pain, and we think the best way to proceed would be getting her over to orthopedics if she doesn't have any relief with the prednisone. She's already had back x-rays that showed degeneration back in April, and orthopedics will want to order their own specific testing

## 2016-11-19 NOTE — Patient Instructions (Signed)
Follow up as needed

## 2016-11-19 NOTE — Telephone Encounter (Signed)
Patient Notified. She says the Prednisone has helped her. She wants to wait on the referral right now. She finishes the Prednisone on Saturday and comes back in on Tues to see Dr.Crissman and will decide what she wants to do then.

## 2016-11-24 ENCOUNTER — Encounter: Payer: Self-pay | Admitting: Family Medicine

## 2016-11-24 ENCOUNTER — Ambulatory Visit (INDEPENDENT_AMBULATORY_CARE_PROVIDER_SITE_OTHER): Payer: Medicare Other | Admitting: Family Medicine

## 2016-11-24 VITALS — BP 125/77 | HR 58 | Ht 62.6 in | Wt 308.0 lb

## 2016-11-24 DIAGNOSIS — Z0000 Encounter for general adult medical examination without abnormal findings: Secondary | ICD-10-CM

## 2016-11-24 DIAGNOSIS — E669 Obesity, unspecified: Secondary | ICD-10-CM

## 2016-11-24 DIAGNOSIS — I10 Essential (primary) hypertension: Secondary | ICD-10-CM

## 2016-11-24 DIAGNOSIS — Z7189 Other specified counseling: Secondary | ICD-10-CM | POA: Insufficient documentation

## 2016-11-24 DIAGNOSIS — Z1329 Encounter for screening for other suspected endocrine disorder: Secondary | ICD-10-CM | POA: Diagnosis not present

## 2016-11-24 DIAGNOSIS — E785 Hyperlipidemia, unspecified: Secondary | ICD-10-CM

## 2016-11-24 DIAGNOSIS — E1142 Type 2 diabetes mellitus with diabetic polyneuropathy: Secondary | ICD-10-CM | POA: Diagnosis not present

## 2016-11-24 DIAGNOSIS — M545 Low back pain, unspecified: Secondary | ICD-10-CM

## 2016-11-24 DIAGNOSIS — Z6841 Body Mass Index (BMI) 40.0 and over, adult: Secondary | ICD-10-CM | POA: Diagnosis not present

## 2016-11-24 DIAGNOSIS — G8929 Other chronic pain: Secondary | ICD-10-CM

## 2016-11-24 LAB — URINALYSIS, ROUTINE W REFLEX MICROSCOPIC
Bilirubin, UA: NEGATIVE
Ketones, UA: NEGATIVE
LEUKOCYTES UA: NEGATIVE
Nitrite, UA: NEGATIVE
PH UA: 5 (ref 5.0–7.5)
Protein, UA: NEGATIVE
RBC UA: NEGATIVE
Specific Gravity, UA: 1.015 (ref 1.005–1.030)
Urobilinogen, Ur: 0.2 mg/dL (ref 0.2–1.0)

## 2016-11-24 LAB — MICROSCOPIC EXAMINATION
Bacteria, UA: NONE SEEN
RBC, UA: NONE SEEN /hpf (ref 0–?)

## 2016-11-24 LAB — BAYER DCA HB A1C WAIVED: HB A1C (BAYER DCA - WAIVED): 7.4 % — ABNORMAL HIGH (ref <7.0)

## 2016-11-24 MED ORDER — SITAGLIPTIN PHOSPHATE 100 MG PO TABS: 100.0000 mg | ORAL_TABLET | Freq: Every day | ORAL | 12 refills | Status: DC

## 2016-11-24 MED ORDER — BENAZEPRIL HCL 40 MG PO TABS: 40.0000 mg | ORAL_TABLET | Freq: Every day | ORAL | 4 refills | Status: DC

## 2016-11-24 MED ORDER — EMPAGLIFLOZIN 25 MG PO TABS: 25.0000 mg | ORAL_TABLET | Freq: Every day | ORAL | 12 refills | Status: DC

## 2016-11-24 MED ORDER — TRAMADOL HCL 50 MG PO TABS: 50.0000 mg | ORAL_TABLET | Freq: Three times a day (TID) | ORAL | 3 refills | Status: DC | PRN

## 2016-11-24 MED ORDER — PREGABALIN 150 MG PO CAPS: 150.0000 mg | ORAL_CAPSULE | Freq: Three times a day (TID) | ORAL | 1 refills | Status: DC

## 2016-11-24 MED ORDER — COLESEVELAM HCL 625 MG PO TABS: 1875.0000 mg | ORAL_TABLET | Freq: Two times a day (BID) | ORAL | 4 refills | Status: DC

## 2016-11-24 NOTE — Assessment & Plan Note (Signed)
Discussed wt loss diet etc 

## 2016-11-24 NOTE — Assessment & Plan Note (Signed)
The current medical regimen is effective;  continue present plan and medications.  

## 2016-11-24 NOTE — Assessment & Plan Note (Signed)
A voluntary discussion about advance care planning including the explanation and discussion of advance directives was extensively discussed  with the patient.  Explanation about the health care proxy and Living will was reviewed and packet with forms with explanation of how to fill them out was given.    

## 2016-11-24 NOTE — Progress Notes (Signed)
BP 125/77   Pulse (!) 58   Ht 5' 2.6" (1.59 m)   Wt (!) 308 lb (139.7 kg)   SpO2 98%   BMI 55.26 kg/m    Subjective:    Patient ID: Anna Juarez, female    DOB: 07-May-1950, 66 y.o.   MRN: [id number]  HPI: Anna Juarez is a 66 y.o. female  Chief Complaint  Patient presents with  . Annual Exam   Prednisone helped back pain a great deal no further UTI type symptoms. Patient's biggest problem is caregiving role taking care of her husband. She does have some help with her daughter now helping 3 days a week. Diabetes doing okay as his insulin was on prednisone. Lyrica doing okay for feet and legs. Takes tramadol occasionally for pain took some prior to prednisone but otherwise been doing okay. Cholesterol doing okay also. Patient also has had prior to prednisone usage little lightheaded woozy-type feeling may be related to some of the medicines she is taken.  Relevant past medical, surgical, family and social history reviewed and updated as indicated. Interim medical history since our last visit reviewed. Allergies and medications reviewed and updated.  Review of Systems  Constitutional: Negative.   HENT: Negative.   Eyes: Negative.   Respiratory: Negative.   Cardiovascular: Negative.   Gastrointestinal: Negative.   Endocrine: Negative.   Genitourinary: Negative.   Musculoskeletal: Negative.   Skin: Negative.   Allergic/Immunologic: Negative.   Neurological: Negative.   Hematological: Negative.   Psychiatric/Behavioral: Negative.     Per HPI unless specifically indicated above     Objective:    BP 125/77   Pulse (!) 58   Ht 5' 2.6" (1.59 m)   Wt (!) 308 lb (139.7 kg)   SpO2 98%   BMI 55.26 kg/m   Wt Readings from Last 3 Encounters:  11/24/16 (!) 308 lb (139.7 kg)  11/16/16 (!) 309 lb (140.2 kg)  08/24/16 (!) 312 lb (141.5 kg)    Physical Exam  Results for orders placed or performed in visit on 11/16/16  Microscopic Examination  Result Value  Ref Range   WBC, UA None seen 0 - 5 /hpf   RBC, UA None seen 0 - 2 /hpf   Epithelial Cells (non renal) 0-10 0 - 10 /hpf   Bacteria, UA Moderate (A) None seen/Few  Urine Culture, Reflex  Result Value Ref Range   Urine Culture, Routine Final report    Organism ID, Bacteria Comment   UA/M w/rflx Culture, Routine (STAT)  Result Value Ref Range   Specific Gravity, UA 1.010 1.005 - 1.030   pH, UA 5.0 5.0 - 7.5   Color, UA Yellow Yellow   Appearance Ur Clear Clear   Leukocytes, UA Negative Negative   Protein, UA Negative Negative/Trace   Glucose, UA 3+ (A) Negative   Ketones, UA Negative Negative   RBC, UA Negative Negative   Bilirubin, UA Negative Negative   Urobilinogen, Ur 0.2 0.2 - 1.0 mg/dL   Nitrite, UA Negative Negative   Microscopic Examination See below:    Urinalysis Reflex Comment       Assessment & Plan:   Problem List Items Addressed This Visit      Cardiovascular and Mediastinum   Essential hypertension - Primary    The current medical regimen is effective;  continue present plan and medications.       Relevant Medications   benazepril (LOTENSIN) 40 MG tablet   colesevelam (WELCHOL) 625 MG tablet  Other Relevant Orders   CBC with Differential/Platelet     Endocrine   DM type 2 with diabetic peripheral neuropathy ([place])    The current medical regimen is effective;  continue present plan and medications.       Relevant Medications   benazepril (LOTENSIN) 40 MG tablet   empagliflozin (JARDIANCE) 25 MG TABS tablet   pregabalin (LYRICA) 150 MG capsule   sitaGLIPtin (JANUVIA) 100 MG tablet   Other Relevant Orders   Comprehensive metabolic panel   Urinalysis, Routine w reflex microscopic   Bayer DCA Hb A1c Waived     Other   Hyperlipidemia    The current medical regimen is effective;  continue present plan and medications.       Relevant Medications   benazepril (LOTENSIN) 40 MG tablet   colesevelam (WELCHOL) 625 MG tablet   Other Relevant Orders    Lipid panel   BMI 50.0-59.9, adult (HCC)    Discussed wt loss diet etc      Relevant Medications   empagliflozin (JARDIANCE) 25 MG TABS tablet   sitaGLIPtin (JANUVIA) 100 MG tablet   Back pain   Relevant Medications   traMADol (ULTRAM) 50 MG tablet   Advanced care planning/counseling discussion    A voluntary discussion about advance care planning including the explanation and discussion of advance directives was extensively discussed  with the patient.  Explanation about the health care proxy and Living will was reviewed and packet with forms with explanation of how to fill them out was given.          Other Visit Diagnoses    Annual physical exam       Thyroid disorder screen       Relevant Orders   TSH       Follow up plan: Return for Hemoglobin A1c.

## 2016-11-25 ENCOUNTER — Encounter: Payer: Self-pay | Admitting: Family Medicine

## 2016-11-25 LAB — COMPREHENSIVE METABOLIC PANEL
ALBUMIN: 4.4 g/dL (ref 3.6–4.8)
ALK PHOS: 62 IU/L (ref 39–117)
ALT: 29 IU/L (ref 0–32)
AST: 35 IU/L (ref 0–40)
Albumin/Globulin Ratio: 1.6 (ref 1.2–2.2)
BILIRUBIN TOTAL: 0.4 mg/dL (ref 0.0–1.2)
BUN / CREAT RATIO: 13 (ref 12–28)
BUN: 20 mg/dL (ref 8–27)
CHLORIDE: 100 mmol/L (ref 96–106)
CO2: 19 mmol/L — ABNORMAL LOW (ref 20–29)
Calcium: 9.3 mg/dL (ref 8.7–10.3)
Creatinine, Ser: 1.51 mg/dL — ABNORMAL HIGH (ref 0.57–1.00)
GFR calc Af Amer: 41 mL/min/{1.73_m2} — ABNORMAL LOW (ref >59)
GFR calc non Af Amer: 35 mL/min/{1.73_m2} — ABNORMAL LOW (ref >59)
GLUCOSE: 141 mg/dL — AB (ref 65–99)
Globulin, Total: 2.8 g/dL (ref 1.5–4.5)
Potassium: 4.7 mmol/L (ref 3.5–5.2)
Sodium: 140 mmol/L (ref 134–144)
Total Protein: 7.2 g/dL (ref 6.0–8.5)

## 2016-11-25 LAB — LIPID PANEL
CHOL/HDL RATIO: 3.1 ratio (ref 0.0–4.4)
Cholesterol, Total: 149 mg/dL (ref 100–199)
HDL: 48 mg/dL (ref >39)
LDL CALC: 45 mg/dL (ref 0–99)
Triglycerides: 281 mg/dL — ABNORMAL HIGH (ref 0–149)
VLDL CHOLESTEROL CAL: 56 mg/dL — AB (ref 5–40)

## 2016-11-25 LAB — CBC WITH DIFFERENTIAL/PLATELET
BASOS ABS: 0 10*3/uL (ref 0.0–0.2)
Basos: 0 %
EOS (ABSOLUTE): 0.1 10*3/uL (ref 0.0–0.4)
Eos: 2 %
HEMOGLOBIN: 14.1 g/dL (ref 11.1–15.9)
Hematocrit: 43.9 % (ref 34.0–46.6)
Immature Grans (Abs): 0 10*3/uL (ref 0.0–0.1)
Immature Granulocytes: 1 %
LYMPHS ABS: 2 10*3/uL (ref 0.7–3.1)
Lymphs: 22 %
MCH: 29.2 pg (ref 26.6–33.0)
MCHC: 32.1 g/dL (ref 31.5–35.7)
MCV: 91 fL (ref 79–97)
MONOCYTES: 6 %
MONOS ABS: 0.6 10*3/uL (ref 0.1–0.9)
NEUTROS ABS: 6.1 10*3/uL (ref 1.4–7.0)
Neutrophils: 69 %
Platelets: 205 10*3/uL (ref 150–379)
RBC: 4.83 x10E6/uL (ref 3.77–5.28)
RDW: 14.4 % (ref 12.3–15.4)
WBC: 8.8 10*3/uL (ref 3.4–10.8)

## 2016-11-25 LAB — TSH: TSH: 2.13 u[IU]/mL (ref 0.450–4.500)

## 2016-12-10 ENCOUNTER — Telehealth: Payer: Self-pay | Admitting: Family Medicine

## 2016-12-10 NOTE — Telephone Encounter (Signed)
Patient is on Jardiance, so this is to be expected.

## 2016-12-10 NOTE — Telephone Encounter (Signed)
Component     Latest Ref Rng & Units 07/22/2016 08/24/2016 11/24/2016  Specific Gravity, UA     1.005 - 1.030 1.010  1.015  pH, UA     5.0 - 7.5 5.0  5.0  Color, UA     Yellow Yellow  Yellow  Appearance Ur     Clear Clear  Clear  Leukocytes, UA     Negative Negative  Negative  Protein, UA     Negative/Trace Negative  Negative  Glucose     Negative 3+ (A)  3+ (A)  Ketones, UA     Negative Negative  Negative  RBC, UA     Negative Trace (A)  Negative  Bilirubin, UA     Negative Negative  Negative  Urobilinogen, Ur     0.2 - 1.0 mg/dL 0.2  0.2  Nitrite, UA     Negative Negative  Negative  Microscopic Examination      See below:  See below:  Bayer DCA Hb A1c Waived     <7.0 %  7.0 (H) 7.4 (H)    Please advise.

## 2016-12-10 NOTE — Telephone Encounter (Signed)
Message relayed to patient. Verbalized understanding and denied questions.   

## 2016-12-10 NOTE — Telephone Encounter (Signed)
Mariama with [facility] house calls wanted to let Dr Jeananne Rama know that patient had 4 plus glucose in urine. She stated Dr Jeananne Rama had been working with the patient adjusting her Metformin.  [place]

## 2016-12-28 ENCOUNTER — Other Ambulatory Visit: Payer: Self-pay | Admitting: Family Medicine

## 2016-12-28 DIAGNOSIS — I10 Essential (primary) hypertension: Secondary | ICD-10-CM

## 2017-01-13 ENCOUNTER — Other Ambulatory Visit: Payer: Self-pay | Admitting: Family Medicine

## 2017-01-27 ENCOUNTER — Ambulatory Visit (INDEPENDENT_AMBULATORY_CARE_PROVIDER_SITE_OTHER): Payer: Medicare Other | Admitting: Family Medicine

## 2017-01-27 ENCOUNTER — Encounter: Payer: Self-pay | Admitting: Family Medicine

## 2017-01-27 VITALS — BP 107/67 | HR 55 | Temp 97.3°F | Wt 308.2 lb

## 2017-01-27 DIAGNOSIS — M25552 Pain in left hip: Secondary | ICD-10-CM | POA: Diagnosis not present

## 2017-01-27 NOTE — Progress Notes (Signed)
   BP 107/67 (BP Location: Left Arm, Patient Position: Sitting, Cuff Size: Large)   Pulse (!) 55   Temp (!) 97.3 F (36.3 C)   Wt (!) 308 lb 3.2 oz (139.8 kg)   SpO2 100%   BMI 55.30 kg/m    Subjective:    Patient ID: Anna Juarez, female    DOB: [birth year], 66 y.o.   MRN: [id number]  HPI: Anna Juarez is a 66 y.o. female  Chief Complaint  Patient presents with  . Hip Pain    Left. Ongoing for a couple of months. Starting to worsen and give way.    Pt presents with ongoing left hip and lower back pain for 3-6 months. Brief relief with prednisone and tramadol, but sxs keep coming back. Worst at night when pt tries to sleep, there are no comfortable positions. 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 No weakness, numbness, or tingling down legs.   Relevant past medical, surgical, family and social history reviewed and updated as indicated. Interim medical history since our last visit reviewed. Allergies and medications reviewed and updated.  Review of Systems  Constitutional: Negative.   HENT: Negative.   Respiratory: Negative.   Cardiovascular: Negative.   Gastrointestinal: Negative.   Musculoskeletal: Positive for arthralgias, back pain and gait problem.  Psychiatric/Behavioral: Negative.    Per HPI unless specifically indicated above     Objective:    BP 107/67 (BP Location: Left Arm, Patient Position: Sitting, Cuff Size: Large)   Pulse (!) 55   Temp (!) 97.3 F (36.3 C)   Wt (!) 308 lb 3.2 oz (139.8 kg)   SpO2 100%   BMI 55.30 kg/m   Wt Readings from Last 3 Encounters:  01/27/17 (!) 308 lb 3.2 oz (139.8 kg)  11/24/16 (!) 308 lb (139.7 kg)  11/16/16 (!) 309 lb (140.2 kg)    Physical Exam  Constitutional: She is oriented to person, place, and time. She appears well-developed and well-nourished. No distress.  HENT:  Head: Atraumatic.  Eyes: Pupils are equal, round, and reactive to light. Conjunctivae are normal.  Neck: Normal  range of motion. Neck supple.  Cardiovascular: Normal rate and normal heart sounds.   Pulmonary/Chest: Effort normal and breath sounds normal.  Musculoskeletal: She exhibits tenderness (left lower back down into lateral hip).  Antalgic gait   Neurological: She is alert and oriented to person, place, and time.  Skin: Skin is warm and dry.  Psychiatric: She has a normal mood and affect. Her behavior is normal.  Nursing note and vitals reviewed.     Assessment & Plan:   Problem List Items Addressed This Visit    None    Visit Diagnoses    Left hip pain    -  Primary   As discussed previously, my recommendation is an orthopedic consult at this time given persistence. Lidocaine patches and tylenol in the meantime   Relevant Orders   AMB referral to orthopedics       Follow up plan: Return if symptoms worsen or fail to improve.

## 2017-01-30 NOTE — Patient Instructions (Signed)
Follow up as needed

## 2017-02-23 ENCOUNTER — Encounter: Payer: Self-pay | Admitting: Family Medicine

## 2017-02-23 ENCOUNTER — Ambulatory Visit: Payer: Medicare Other | Admitting: Family Medicine

## 2017-02-23 VITALS — BP 115/73 | HR 60 | Wt 308.0 lb

## 2017-02-23 DIAGNOSIS — E1142 Type 2 diabetes mellitus with diabetic polyneuropathy: Secondary | ICD-10-CM | POA: Diagnosis not present

## 2017-02-23 DIAGNOSIS — I10 Essential (primary) hypertension: Secondary | ICD-10-CM | POA: Diagnosis not present

## 2017-02-23 DIAGNOSIS — E785 Hyperlipidemia, unspecified: Secondary | ICD-10-CM

## 2017-02-23 LAB — BAYER DCA HB A1C WAIVED: HB A1C (BAYER DCA - WAIVED): 7.3 % — ABNORMAL HIGH (ref <7.0)

## 2017-02-23 NOTE — Progress Notes (Signed)
BP 115/73   Pulse 60   Wt (!) 308 lb (139.7 kg)   SpO2 98%   BMI 55.26 kg/m    Subjective:    Patient ID: Anna Juarez, female    DOB: May 28, 1950, 66 y.o.   MRN: [id number]  HPI: Anna Juarez is a 66 y.o. female  Chief Complaint  Patient presents with  . Follow-up   Patient follow-up diabetes all in all doing well taking medications without problems low blood sugar spells still remaining somewhat elevated butall in all doing well. Blood pressure doing well no complaints taking medications faithfully Working with dermatologist on rash for chronic fungal infection and doing better.  Relevant past medical, surgical, family and social history reviewed and updated as indicated. Interim medical history since our last visit reviewed. Allergies and medications reviewed and updated.  Review of Systems  Constitutional: Negative.   Respiratory: Negative.   Cardiovascular: Negative.     Per HPI unless specifically indicated above     Objective:    BP 115/73   Pulse 60   Wt (!) 308 lb (139.7 kg)   SpO2 98%   BMI 55.26 kg/m   Wt Readings from Last 3 Encounters:  02/23/17 (!) 308 lb (139.7 kg)  01/27/17 (!) 308 lb 3.2 oz (139.8 kg)  11/24/16 (!) 308 lb (139.7 kg)    Physical Exam  Constitutional: She is oriented to person, place, and time. She appears well-developed and well-nourished.  HENT:  Head: Normocephalic and atraumatic.  Eyes: Conjunctivae and EOM are normal.  Neck: Normal range of motion.  Cardiovascular: Normal rate, regular rhythm and normal heart sounds.  Pulmonary/Chest: Effort normal and breath sounds normal.  Musculoskeletal: Normal range of motion.  Neurological: She is alert and oriented to person, place, and time.  Skin: No erythema.  Psychiatric: She has a normal mood and affect. Her behavior is normal. Judgment and thought content normal.    Results for orders placed or performed in visit on 11/24/16  Microscopic Examination  Result  Value Ref Range   WBC, UA 0-5 0 - 5 /hpf   RBC, UA None seen 0 - 2 /hpf   Epithelial Cells (non renal) 0-10 0 - 10 /hpf   Bacteria, UA None seen None seen/Few  CBC with Differential/Platelet  Result Value Ref Range   WBC 8.8 3.4 - 10.8 x10E3/uL   RBC 4.83 3.77 - 5.28 x10E6/uL   Hemoglobin 14.1 11.1 - 15.9 g/dL   Hematocrit 43.9 34.0 - 46.6 %   MCV 91 79 - 97 fL   MCH 29.2 26.6 - 33.0 pg   MCHC 32.1 31.5 - 35.7 g/dL   RDW 14.4 12.3 - 15.4 %   Platelets 205 150 - 379 x10E3/uL   Neutrophils 69 Not Estab. %   Lymphs 22 Not Estab. %   Monocytes 6 Not Estab. %   Eos 2 Not Estab. %   Basos 0 Not Estab. %   Neutrophils Absolute 6.1 1.4 - 7.0 x10E3/uL   Lymphocytes Absolute 2.0 0.7 - 3.1 x10E3/uL   Monocytes Absolute 0.6 0.1 - 0.9 x10E3/uL   EOS (ABSOLUTE) 0.1 0.0 - 0.4 x10E3/uL   Basophils Absolute 0.0 0.0 - 0.2 x10E3/uL   Immature Granulocytes 1 Not Estab. %   Immature Grans (Abs) 0.0 0.0 - 0.1 x10E3/uL  Comprehensive metabolic panel  Result Value Ref Range   Glucose 141 (H) 65 - 99 mg/dL   BUN 20 8 - 27 mg/dL   Creatinine, Ser 1.51 (H)  0.57 - 1.00 mg/dL   GFR calc non Af Amer 35 (L) >59 mL/min/1.73   GFR calc Af Amer 41 (L) >59 mL/min/1.73   BUN/Creatinine Ratio 13 12 - 28   Sodium 140 134 - 144 mmol/L   Potassium 4.7 3.5 - 5.2 mmol/L   Chloride 100 96 - 106 mmol/L   CO2 19 (L) 20 - 29 mmol/L   Calcium 9.3 8.7 - 10.3 mg/dL   Total Protein 7.2 6.0 - 8.5 g/dL   Albumin 4.4 3.6 - 4.8 g/dL   Globulin, Total 2.8 1.5 - 4.5 g/dL   Albumin/Globulin Ratio 1.6 1.2 - 2.2   Bilirubin Total 0.4 0.0 - 1.2 mg/dL   Alkaline Phosphatase 62 39 - 117 IU/L   AST 35 0 - 40 IU/L   ALT 29 0 - 32 IU/L  Lipid panel  Result Value Ref Range   Cholesterol, Total 149 100 - 199 mg/dL   Triglycerides 281 (H) 0 - 149 mg/dL   HDL 48 >39 mg/dL   VLDL Cholesterol Cal 56 (H) 5 - 40 mg/dL   LDL Calculated 45 0 - 99 mg/dL   Chol/HDL Ratio 3.1 0.0 - 4.4 ratio  TSH  Result Value Ref Range   TSH 2.130  0.450 - 4.500 uIU/mL  Urinalysis, Routine w reflex microscopic  Result Value Ref Range   Specific Gravity, UA 1.015 1.005 - 1.030   pH, UA 5.0 5.0 - 7.5   Color, UA Yellow Yellow   Appearance Ur Clear Clear   Leukocytes, UA Negative Negative   Protein, UA Negative Negative/Trace   Glucose, UA 3+ (A) Negative   Ketones, UA Negative Negative   RBC, UA Negative Negative   Bilirubin, UA Negative Negative   Urobilinogen, Ur 0.2 0.2 - 1.0 mg/dL   Nitrite, UA Negative Negative   Microscopic Examination See below:   Bayer DCA Hb A1c Waived  Result Value Ref Range   Bayer DCA Hb A1c Waived 7.4 (H) <7.0 %      Assessment & Plan:   Problem List Items Addressed This Visit      Cardiovascular and Mediastinum   Essential hypertension    The current medical regimen is effective;  continue present plan and medications.         Endocrine   DM type 2 with diabetic peripheral neuropathy ([place]) - Primary    The current medical regimen is effective;  continue present plan and medications.       Relevant Orders   Bayer DCA Hb A1c Waived     Other   Hyperlipidemia    The current medical regimen is effective;  continue present plan and medications.           Follow up plan: Return in about 3 months (around 05/26/2017) for BMP,  Lipids, ALT, AST, Hemoglobin A1c.

## 2017-02-23 NOTE — Assessment & Plan Note (Signed)
The current medical regimen is effective;  continue present plan and medications.  

## 2017-03-03 LAB — HM DIABETES EYE EXAM

## 2017-04-08 ENCOUNTER — Ambulatory Visit: Payer: Medicare Other | Admitting: Family Medicine

## 2017-04-08 VITALS — BP 157/73 | Temp 98.4°F | Wt 307.0 lb

## 2017-04-08 DIAGNOSIS — J029 Acute pharyngitis, unspecified: Secondary | ICD-10-CM

## 2017-04-08 MED ORDER — AMOXICILLIN 875 MG PO TABS: 875.0000 mg | ORAL_TABLET | Freq: Two times a day (BID) | ORAL | 0 refills | Status: DC

## 2017-04-08 NOTE — Progress Notes (Signed)
   BP (!) 157/73   Temp 98.4 F (36.9 C) (Oral)   Wt (!) 307 lb (139.3 kg)   SpO2 (!) 56%   BMI 55.08 kg/m    Subjective:    Patient ID: Anna Juarez, female    DOB: 1951/03/04, 67 y.o.   MRN: [id number]  HPI: Anna Juarez is a 67 y.o. female  Chief Complaint  Patient presents with  . Sore Throat   Sore throat and drainage x over a month. Noticed some pus on her tonsils several weeks ago. Drainage resolved but sore, swollen throat remains. Denies known fevers, myalgias, cough, chest tightness. Has ben trying OTC lozenges and tylenol with some relief.   Relevant past medical, surgical, family and social history reviewed and updated as indicated. Interim medical history since our last visit reviewed. Allergies and medications reviewed and updated.  Review of Systems  Constitutional: Negative.   HENT: Positive for sore throat.   Respiratory: Negative.   Cardiovascular: Negative.   Gastrointestinal: Negative.   Musculoskeletal: Negative.   Neurological: Negative.     Per HPI unless specifically indicated above     Objective:    BP (!) 157/73   Temp 98.4 F (36.9 C) (Oral)   Wt (!) 307 lb (139.3 kg)   SpO2 (!) 56%   BMI 55.08 kg/m   Wt Readings from Last 3 Encounters:  04/08/17 (!) 307 lb (139.3 kg)  02/23/17 (!) 308 lb (139.7 kg)  01/27/17 (!) 308 lb 3.2 oz (139.8 kg)    Physical Exam  Constitutional: She is oriented to person, place, and time. She appears well-developed and well-nourished. No distress.  HENT:  Head: Atraumatic.  Right Ear: External ear normal.  Left Ear: External ear normal.  Nose: Nose normal.  Oropharynx significantly erythematous and cryptic  Eyes: Conjunctivae are normal. Pupils are equal, round, and reactive to light. No scleral icterus.  Neck: Normal range of motion. Neck supple.  Cardiovascular: Normal rate and normal heart sounds.  Pulmonary/Chest: Effort normal and breath sounds normal. No respiratory distress.    Musculoskeletal: Normal range of motion.  Neurological: She is alert and oriented to person, place, and time.  Skin: Skin is warm and dry.  Psychiatric: She has a normal mood and affect. Her behavior is normal.  Nursing note and vitals reviewed.  Results for orders placed or performed in visit on 04/08/17  Rapid Strep screen  Result Value Ref Range   Strep Gp A Ag, IA W/Reflex Negative Negative  Culture, Group A Strep  Result Value Ref Range   Strep A Culture Negative       Assessment & Plan:   Problem List Items Addressed This Visit    None    Visit Diagnoses    Pharyngitis, unspecified etiology    -  Primary   Rapid strep neg, await cx. Will start amoxil given duration and severity as well as consistency of exam with bacterial infection. Continue OTC remedies for pain   Relevant Orders   Rapid Strep screen (Completed)       Follow up plan: Return for as scheduled.

## 2017-04-11 ENCOUNTER — Encounter: Payer: Self-pay | Admitting: Family Medicine

## 2017-04-11 LAB — RAPID STREP SCREEN (MED CTR MEBANE ONLY): STREP GP A AG, IA W/REFLEX: NEGATIVE

## 2017-04-11 LAB — CULTURE, GROUP A STREP: STREP A CULTURE: NEGATIVE

## 2017-04-11 NOTE — Patient Instructions (Signed)
Follow up as scheduled.  

## 2017-05-26 ENCOUNTER — Ambulatory Visit
Admission: RE | Admit: 2017-05-26 | Discharge: 2017-05-26 | Disposition: A | Discharge status: Home / Self Care | Payer: Medicare Other | Source: Ambulatory Visit | Attending: Family Medicine | Admitting: Family Medicine

## 2017-05-26 ENCOUNTER — Encounter: Payer: Self-pay | Admitting: Family Medicine

## 2017-05-26 ENCOUNTER — Ambulatory Visit: Payer: Medicare Other | Admitting: Family Medicine

## 2017-05-26 VITALS — BP 122/80 | HR 58 | Temp 98.3°F | Wt 305.0 lb

## 2017-05-26 DIAGNOSIS — R070 Pain in throat: Secondary | ICD-10-CM | POA: Diagnosis not present

## 2017-05-26 DIAGNOSIS — E1142 Type 2 diabetes mellitus with diabetic polyneuropathy: Secondary | ICD-10-CM | POA: Diagnosis not present

## 2017-05-26 DIAGNOSIS — R079 Chest pain, unspecified: Secondary | ICD-10-CM

## 2017-05-26 DIAGNOSIS — E785 Hyperlipidemia, unspecified: Secondary | ICD-10-CM

## 2017-05-26 DIAGNOSIS — I10 Essential (primary) hypertension: Secondary | ICD-10-CM

## 2017-05-26 LAB — LP+ALT+AST PICCOLO, WAIVED
ALT (SGPT) PICCOLO, WAIVED: 37 U/L (ref 10–47)
AST (SGOT) PICCOLO, WAIVED: 34 U/L (ref 11–38)
CHOLESTEROL PICCOLO, WAIVED: 140 mg/dL (ref <200)
Chol/HDL Ratio Piccolo,Waive: 2.5 mg/dL
HDL CHOL PICCOLO, WAIVED: 56 mg/dL — AB (ref >59)
LDL Chol Calc Piccolo Waived: 59 mg/dL (ref <100)
Triglycerides Piccolo,Waived: 127 mg/dL (ref <150)
VLDL Chol Calc Piccolo,Waive: 25 mg/dL (ref <30)

## 2017-05-26 LAB — BAYER DCA HB A1C WAIVED: HB A1C: 7.1 % — AB (ref <7.0)

## 2017-05-26 NOTE — Assessment & Plan Note (Signed)
The current medical regimen is effective;  continue present plan and medications.  

## 2017-05-26 NOTE — Progress Notes (Signed)
BP 122/80   Pulse (!) 58   Temp 98.3 F (36.8 C) (Oral)   Wt (!) 305 lb (138.3 kg)   SpO2 97%   BMI 54.72 kg/m    Subjective:    Patient ID: Anna Juarez, female    DOB: May 04, 1950, 67 y.o.   MRN: [id number]  HPI: Anna Juarez is a 67 y.o. female  Chief Complaint  Patient presents with  . Follow-up   Patient's been on reflux medication now for long-term Nexium for over a year and is been added ranitidine for the last month.  This is on recommendation from ENT with some scratchy and discomfort in the right throat neck area.  This is not improved at all with high dose reflux medication.  And if anything gets worse. This throat irritation discomfort has also not responded to antibiotics.  In addition to above patient has spells of lightheaded wobbly on her feet has to hold onto the wall or bounce off the wall instead of falling.  These are first bad to the point of having to hold on then get better but still weak during the day.  This seems to happen 2-3 times a week.  Not associated with low blood sugar or low blood pressure as is checked both blood sugars and blood pressures during these spells with no associated drop.  Blood sugars are usually in the 04/26/1928 range.  Patient symptoms are not associated with low blood sugar or low blood pressure as patient during these spells Relevant past medical, surgical, family and social history reviewed and updated as indicated. Interim medical history since our last visit reviewed. Allergies and medications reviewed and updated.  Review of Systems  Constitutional: Negative.   Respiratory: Negative.   Cardiovascular: Negative.     Per HPI unless specifically indicated above     Objective:    BP 122/80   Pulse (!) 58   Temp 98.3 F (36.8 C) (Oral)   Wt (!) 305 lb (138.3 kg)   SpO2 97%   BMI 54.72 kg/m   Wt Readings from Last 3 Encounters:  05/26/17 (!) 305 lb (138.3 kg)  04/08/17 (!) 307 lb (139.3 kg)  02/23/17 (!)  308 lb (139.7 kg)    Physical Exam  Constitutional: She is oriented to person, place, and time. She appears well-developed and well-nourished.  HENT:  Head: Normocephalic and atraumatic.  Eyes: Conjunctivae and EOM are normal.  Neck: Normal range of motion.  Cardiovascular: Normal rate, regular rhythm and normal heart sounds.  Pulmonary/Chest: Effort normal and breath sounds normal.  Musculoskeletal: Normal range of motion.  Generalized muscle tenderness in right neck area  Neurological: She is alert and oriented to person, place, and time.  Skin: No erythema.  Psychiatric: She has a normal mood and affect. Her behavior is normal. Judgment and thought content normal.    Results for orders placed or performed in visit on 04/08/17  Rapid Strep screen  Result Value Ref Range   Strep Gp A Ag, IA W/Reflex Negative Negative  Culture, Group A Strep  Result Value Ref Range   Strep A Culture Negative       Assessment & Plan:   Problem List Items Addressed This Visit      Cardiovascular and Mediastinum   Essential hypertension - Primary   Relevant Orders   Basic metabolic panel   Bayer DCA Hb A1c Waived   LP+ALT+AST Piccolo, [place]     Endocrine   DM type 2 with  diabetic peripheral neuropathy (HCC)    Diabetes adequate control with A1c of 7.1.      Relevant Orders   Basic metabolic panel   Bayer DCA Hb A1c Waived   LP+ALT+AST Piccolo, Waived     Other   Hyperlipidemia    The current medical regimen is effective;  continue present plan and medications.       Relevant Orders   Basic metabolic panel   Bayer DCA Hb A1c Waived   LP+ALT+AST Piccolo, Waived   Chest pain    Patient with nonspecific lightheaded type sensation EKG was normal will observe if problems persist will further evaluate.      Relevant Orders   EKG 12-Lead (Completed)   DG Chest 2 View   Ambulatory referral to ENT   Throat pain    Ongoing chronic right-sided throat pain not responding to reflux  medications. Will refer to ear nose and throat again to review and evaluate. Will also get chest x-ray          Follow up plan: Return in about 3 months (around 08/23/2017) for Recheck medical problems.

## 2017-05-26 NOTE — Assessment & Plan Note (Signed)
Diabetes adequate control with A1c of 7.1.

## 2017-05-26 NOTE — Assessment & Plan Note (Addendum)
Ongoing chronic right-sided throat pain not responding to reflux medications. Will refer to ear nose and throat again to review and evaluate. Will also get chest x-ray

## 2017-05-26 NOTE — Assessment & Plan Note (Signed)
Patient with nonspecific lightheaded type sensation EKG was normal will observe if problems persist will further evaluate.

## 2017-05-27 LAB — BASIC METABOLIC PANEL
BUN / CREAT RATIO: 17 (ref 12–28)
BUN: 25 mg/dL (ref 8–27)
CO2: 23 mmol/L (ref 20–29)
CREATININE: 1.45 mg/dL — AB (ref 0.57–1.00)
Calcium: 9 mg/dL (ref 8.7–10.3)
Chloride: 104 mmol/L (ref 96–106)
GFR, EST AFRICAN AMERICAN: 43 mL/min/{1.73_m2} — AB (ref >59)
GFR, EST NON AFRICAN AMERICAN: 37 mL/min/{1.73_m2} — AB (ref >59)
Glucose: 128 mg/dL — ABNORMAL HIGH (ref 65–99)
Potassium: 4.5 mmol/L (ref 3.5–5.2)
Sodium: 142 mmol/L (ref 134–144)

## 2017-06-01 ENCOUNTER — Telehealth: Payer: Self-pay | Admitting: Family Medicine

## 2017-06-01 NOTE — Telephone Encounter (Signed)
It appears that her last one was billed on 10/31/2015.   Copied from [place]. Topic: Quick Communication - See Telephone Encounter >> Jun 01, 2017  4:30 PM Oneta Rack wrote: Osvaldo Human name: Marcie Bal  Relation to pt: RN from [facility]  Call back number: [phone]    Reason for call:  Inquiring when patient had her last AWV, please advise >> Jun 01, 2017  4:33 PM Oneta Rack wrote: Osvaldo Human name: Marcie Bal  Relation to pt: RN from [facility]  Call back number: [phone]    Reason for call:  Inquiring when patient had her last AWV, please advise

## 2017-06-02 NOTE — Telephone Encounter (Signed)
Pt had yearly physical exam done on 11/24/16. Unsure of how to know if AWV was done by Dr. Jeananne Rama. Can you help Tiffany?

## 2017-06-05 ENCOUNTER — Other Ambulatory Visit: Payer: Self-pay | Admitting: Family Medicine

## 2017-06-05 DIAGNOSIS — E1142 Type 2 diabetes mellitus with diabetic polyneuropathy: Secondary | ICD-10-CM

## 2017-06-07 NOTE — Telephone Encounter (Signed)
LOV 05/26/17 with Dr. Jeananne Rama / Refill request for Lyrica /

## 2017-06-21 ENCOUNTER — Other Ambulatory Visit: Payer: Self-pay | Admitting: Otolaryngology

## 2017-06-21 DIAGNOSIS — R221 Localized swelling, mass and lump, neck: Secondary | ICD-10-CM

## 2017-06-29 ENCOUNTER — Ambulatory Visit: Payer: Medicare Other

## 2017-06-29 ENCOUNTER — Ambulatory Visit
Admission: RE | Admit: 2017-06-29 | Discharge: 2017-06-29 | Disposition: A | Discharge status: Home / Self Care | Payer: Medicare Other | Source: Ambulatory Visit | Attending: Otolaryngology | Admitting: Otolaryngology

## 2017-06-29 DIAGNOSIS — Z981 Arthrodesis status: Secondary | ICD-10-CM | POA: Insufficient documentation

## 2017-06-29 DIAGNOSIS — R221 Localized swelling, mass and lump, neck: Secondary | ICD-10-CM | POA: Diagnosis present

## 2017-06-29 DIAGNOSIS — Z9889 Other specified postprocedural states: Secondary | ICD-10-CM | POA: Insufficient documentation

## 2017-06-29 HISTORY — DX: Disorder of kidney and ureter, unspecified: N28.9

## 2017-06-29 LAB — POCT I-STAT CREATININE: Creatinine, Ser: 1.4 mg/dL — ABNORMAL HIGH (ref 0.44–1.00)

## 2017-06-29 MED ORDER — IOPAMIDOL (ISOVUE-300) INJECTION 61%
60.0000 mL | Freq: Once | INTRAVENOUS | Status: AC | PRN
  Administered 2017-06-29: 60 mL via INTRAVENOUS

## 2017-06-29 MED ORDER — IOPAMIDOL (ISOVUE-300) INJECTION 61%: 75.0000 mL | Freq: Once | INTRAVENOUS | Status: DC | PRN

## 2017-06-30 ENCOUNTER — Other Ambulatory Visit: Payer: Self-pay | Admitting: Otolaryngology

## 2017-06-30 DIAGNOSIS — R1310 Dysphagia, unspecified: Secondary | ICD-10-CM

## 2017-07-15 ENCOUNTER — Ambulatory Visit: Payer: Medicare Other | Attending: Otolaryngology

## 2017-07-26 ENCOUNTER — Ambulatory Visit: Payer: Medicare Other

## 2017-09-02 ENCOUNTER — Ambulatory Visit: Payer: Medicare Other | Admitting: Family Medicine

## 2017-10-06 ENCOUNTER — Encounter: Payer: Self-pay | Admitting: Family Medicine

## 2017-11-05 ENCOUNTER — Other Ambulatory Visit: Payer: Self-pay | Admitting: Family Medicine

## 2017-11-05 DIAGNOSIS — E785 Hyperlipidemia, unspecified: Secondary | ICD-10-CM

## 2017-11-05 DIAGNOSIS — I10 Essential (primary) hypertension: Secondary | ICD-10-CM

## 2017-11-25 ENCOUNTER — Encounter: Payer: Medicare Other | Admitting: Family Medicine

## 2017-11-25 ENCOUNTER — Ambulatory Visit: Payer: Medicare Other

## 2017-12-01 ENCOUNTER — Encounter: Payer: Self-pay | Admitting: Physician Assistant

## 2017-12-01 ENCOUNTER — Ambulatory Visit: Payer: Medicare Other | Admitting: Physician Assistant

## 2017-12-01 VITALS — BP 132/76 | HR 53 | Temp 98.5°F | Ht 62.2 in | Wt 303.0 lb

## 2017-12-01 DIAGNOSIS — G4452 New daily persistent headache (NDPH): Secondary | ICD-10-CM

## 2017-12-01 DIAGNOSIS — R519 Headache, unspecified: Secondary | ICD-10-CM

## 2017-12-01 DIAGNOSIS — R51 Headache: Secondary | ICD-10-CM

## 2017-12-01 NOTE — Patient Instructions (Signed)
Warm heat on neck at night

## 2017-12-01 NOTE — Progress Notes (Signed)
   Subjective:    Patient ID: Anna Juarez, female    DOB: [birth year], 67 y.o.   MRN: [id number]  Anna Juarez is a 67 y.o. female presenting on 12/01/2017 for Angioedema (pt states she has had a little bit of swelling on the right side of her nech for a few months, states it has been getting worse lately)   HPI   Patient reports right sided swelling x 2 months. She says at the end of the day her right sided of her neck swells and causes a pulling sensation on the right side of her face. She also reports new onset headaches on her right side, gets dizzy during these episodes. Also reports pain during sleeping, trouble positioning head. She denies vision change, syncope. She denies chest pain. No weakness.   She had a CT scan of her neck in 06/2017 which did not show a mass in the area she is describing. She says she has not had these symptoms when she had that scan. She sees Dr. Selena Batten at [facility]. She says she can't get an appointment there for three months and she says she can't see another provider because they told her she couldn't change providers.   Social History   Tobacco Use  . Smoking status: Never Smoker  . Smokeless tobacco: Never Used  Substance Use Topics  . Alcohol use: No  . Drug use: No    Review of Systems Per HPI unless specifically indicated above     Objective:    BP 132/76   Pulse (!) 53   Temp 98.5 F (36.9 C) (Oral)   Ht 5' 2.2" (1.58 m)   Wt (!) 303 lb (137.4 kg)   SpO2 96%   BMI 55.06 kg/m   Wt Readings from Last 3 Encounters:  12/01/17 (!) 303 lb (137.4 kg)  05/26/17 (!) 305 lb (138.3 kg)  04/08/17 (!) 307 lb (139.3 kg)    Physical Exam  Constitutional: She is oriented to person, place, and time. She appears well-developed and well-nourished.  Eyes: Pupils are equal, round, and reactive to light.  Neck:  No mass appreciated on exam.   Cardiovascular: Normal rate and regular rhythm.  Pulmonary/Chest: Effort normal and breath sounds normal.   Neurological: She is alert and oriented to person, place, and time. No cranial nerve deficit. Coordination normal.  Skin: Skin is warm and dry.  Psychiatric: She has a normal mood and affect. Her behavior is normal.   Results for orders placed or performed during the hospital encounter of 06/29/17  I-STAT creatinine  Result Value Ref Range   Creatinine, Ser 1.40 (H) 0.44 - 1.00 mg/dL      Assessment & Plan:  1. New daily persistent headache  Will get CT head. Do not appreciate mass on right side of neck. Recommend she follow up with ENT. Some of her symptoms do sound musculoskeletal in nature and she should do warm heat at night. Has CKD, no NSAIDs.   2. Nonintractable headache, unspecified chronicity pattern, unspecified headache type  - CT Head Wo Contrast; Future  [facility] 12/01/2017, 2:13 PM

## 2017-12-04 ENCOUNTER — Other Ambulatory Visit: Payer: Self-pay | Admitting: Unknown Physician Specialty

## 2017-12-04 DIAGNOSIS — E1142 Type 2 diabetes mellitus with diabetic polyneuropathy: Secondary | ICD-10-CM

## 2017-12-07 ENCOUNTER — Ambulatory Visit
Admission: RE | Admit: 2017-12-07 | Discharge: 2017-12-07 | Disposition: A | Discharge status: Home / Self Care | Payer: Medicare Other | Source: Ambulatory Visit | Attending: Physician Assistant | Admitting: Physician Assistant

## 2017-12-07 ENCOUNTER — Other Ambulatory Visit: Payer: Self-pay | Admitting: Family Medicine

## 2017-12-07 DIAGNOSIS — E1142 Type 2 diabetes mellitus with diabetic polyneuropathy: Secondary | ICD-10-CM

## 2017-12-07 DIAGNOSIS — R51 Headache: Secondary | ICD-10-CM | POA: Insufficient documentation

## 2017-12-07 DIAGNOSIS — R519 Headache, unspecified: Secondary | ICD-10-CM

## 2017-12-07 NOTE — Telephone Encounter (Signed)
Lyrica refill Last Refill:06/06/17 # 270/1 refill Last OV: 05/26/17 PCP: Orene Desanctis Pharmacy:[facility]

## 2017-12-18 ENCOUNTER — Other Ambulatory Visit: Payer: Self-pay | Admitting: Family Medicine

## 2017-12-18 DIAGNOSIS — I10 Essential (primary) hypertension: Secondary | ICD-10-CM

## 2017-12-18 LAB — FECAL OCCULT BLOOD, GUAIAC: Fecal Occult Blood: NEGATIVE

## 2017-12-20 NOTE — Telephone Encounter (Signed)
Benazepril refill Last Refill:11/24/16 # 90/4 refill Last OV: 05/26/17 PCP: [place] [facility]: [id number] [place], [place] [phone] (Phone) [phone] (Fax)

## 2018-01-03 ENCOUNTER — Ambulatory Visit (INDEPENDENT_AMBULATORY_CARE_PROVIDER_SITE_OTHER): Payer: Medicare Other

## 2018-01-03 ENCOUNTER — Encounter: Payer: Self-pay | Admitting: Family Medicine

## 2018-01-03 ENCOUNTER — Ambulatory Visit (INDEPENDENT_AMBULATORY_CARE_PROVIDER_SITE_OTHER): Payer: Medicare Other | Admitting: Family Medicine

## 2018-01-03 VITALS — BP 124/70 | HR 55 | Temp 97.8°F | Ht 62.0 in | Wt 306.0 lb

## 2018-01-03 VITALS — BP 124/70 | HR 55 | Temp 97.8°F | Resp 17 | Ht 62.0 in | Wt 306.6 lb

## 2018-01-03 DIAGNOSIS — M545 Low back pain, unspecified: Secondary | ICD-10-CM

## 2018-01-03 DIAGNOSIS — I10 Essential (primary) hypertension: Secondary | ICD-10-CM | POA: Diagnosis not present

## 2018-01-03 DIAGNOSIS — E785 Hyperlipidemia, unspecified: Secondary | ICD-10-CM

## 2018-01-03 DIAGNOSIS — E119 Type 2 diabetes mellitus without complications: Secondary | ICD-10-CM

## 2018-01-03 DIAGNOSIS — Z0000 Encounter for general adult medical examination without abnormal findings: Secondary | ICD-10-CM

## 2018-01-03 DIAGNOSIS — Z6841 Body Mass Index (BMI) 40.0 and over, adult: Secondary | ICD-10-CM

## 2018-01-03 DIAGNOSIS — Z23 Encounter for immunization: Secondary | ICD-10-CM | POA: Diagnosis not present

## 2018-01-03 DIAGNOSIS — Z1329 Encounter for screening for other suspected endocrine disorder: Secondary | ICD-10-CM

## 2018-01-03 DIAGNOSIS — Z7189 Other specified counseling: Secondary | ICD-10-CM

## 2018-01-03 DIAGNOSIS — E1142 Type 2 diabetes mellitus with diabetic polyneuropathy: Secondary | ICD-10-CM

## 2018-01-03 DIAGNOSIS — G8929 Other chronic pain: Secondary | ICD-10-CM

## 2018-01-03 LAB — BAYER DCA HB A1C WAIVED: HB A1C (BAYER DCA - WAIVED): 7.7 % — ABNORMAL HIGH (ref <7.0)

## 2018-01-03 LAB — URINALYSIS, ROUTINE W REFLEX MICROSCOPIC
BILIRUBIN UA: NEGATIVE
Ketones, UA: NEGATIVE
Leukocytes, UA: NEGATIVE
Nitrite, UA: NEGATIVE
PH UA: 5 (ref 5.0–7.5)
Protein, UA: NEGATIVE
RBC, UA: NEGATIVE
Specific Gravity, UA: 1.01 (ref 1.005–1.030)
UUROB: 0.2 mg/dL (ref 0.2–1.0)

## 2018-01-03 MED ORDER — PREGABALIN 150 MG PO CAPS: 150.0000 mg | ORAL_CAPSULE | Freq: Three times a day (TID) | ORAL | 1 refills | Status: DC

## 2018-01-03 MED ORDER — TRAMADOL HCL 50 MG PO TABS: 50.0000 mg | ORAL_TABLET | Freq: Three times a day (TID) | ORAL | 3 refills | Status: DC | PRN

## 2018-01-03 MED ORDER — NEXIUM 40 MG PO CPDR: 40.0000 mg | DELAYED_RELEASE_CAPSULE | Freq: Every day | ORAL | 4 refills | Status: DC

## 2018-01-03 MED ORDER — SITAGLIPTIN PHOSPHATE 100 MG PO TABS: 100.0000 mg | ORAL_TABLET | Freq: Every day | ORAL | 4 refills | Status: DC

## 2018-01-03 MED ORDER — METOPROLOL SUCCINATE ER 50 MG PO TB24: 50.0000 mg | ORAL_TABLET | Freq: Every day | ORAL | 4 refills | Status: DC

## 2018-01-03 MED ORDER — BENAZEPRIL HCL 40 MG PO TABS: 40.0000 mg | ORAL_TABLET | Freq: Every day | ORAL | 4 refills | Status: DC

## 2018-01-03 MED ORDER — EMPAGLIFLOZIN 25 MG PO TABS: 25.0000 mg | ORAL_TABLET | Freq: Every day | ORAL | 4 refills | Status: DC

## 2018-01-03 MED ORDER — LOVASTATIN 40 MG PO TABS: 40.0000 mg | ORAL_TABLET | Freq: Every day | ORAL | 4 refills | Status: DC

## 2018-01-03 MED ORDER — COLESEVELAM HCL 625 MG PO TABS: 1875.0000 mg | ORAL_TABLET | Freq: Two times a day (BID) | ORAL | 4 refills | Status: DC

## 2018-01-03 NOTE — Progress Notes (Signed)
BP 124/70   Pulse (!) 55   Temp 97.8 F (36.6 C) (Oral)   Ht 5\' 2"  (1.575 m)   Wt (!) 306 lb (138.8 kg)   SpO2 96%   BMI 55.97 kg/m    Subjective:    Patient ID: Anna Juarez, female    DOB: [birth year], 67 y.o.   MRN: [id number]  HPI: Anna Juarez is a 67 y.o. female  Chief Complaint  Patient presents with  . Annual Exam  Patient's biggest complaint is low back pain especially on the right side no blood in stool or urine are vaginal bleeding no radicular pain just pain in her low back does do some pulling and pushing in care of her husband. Some twisting bending does seem to help. Patient's diabetes is had some poor diet but otherwise doing okay taking medications without problems. Blood pressure cholesterol doing well without complaints. Patient uses rare tramadol bottle of 30 with 3 refills lasted over a year.  Relevant past medical, surgical, family and social history reviewed and updated as indicated. Interim medical history since our last visit reviewed. Allergies and medications reviewed and updated.  Review of Systems  Constitutional: Negative.   HENT: Negative.   Eyes: Negative.   Respiratory: Negative.   Cardiovascular: Negative.   Gastrointestinal: Negative.   Endocrine: Negative.   Genitourinary: Negative.   Musculoskeletal: Negative.   Skin: Negative.   Allergic/Immunologic: Negative.   Neurological: Negative.   Hematological: Negative.   Psychiatric/Behavioral: Negative.     Per HPI unless specifically indicated above     Objective:    BP 124/70   Pulse (!) 55   Temp 97.8 F (36.6 C) (Oral)   Ht 5\' 2"  (1.575 m)   Wt (!) 306 lb (138.8 kg)   SpO2 96%   BMI 55.97 kg/m   Wt Readings from Last 3 Encounters:  01/03/18 (!) 306 lb 9.6 oz (139.1 kg)  01/03/18 (!) 306 lb (138.8 kg)  12/01/17 (!) 303 lb (137.4 kg)    Physical Exam  Constitutional: She is oriented to person, place, and time. She appears well-developed and well-nourished.    HENT:  Head: Normocephalic and atraumatic.  Right Ear: External ear normal.  Left Ear: External ear normal.  Nose: Nose normal.  Mouth/Throat: Oropharynx is clear and moist.  Eyes: Pupils are equal, round, and reactive to light. Conjunctivae and EOM are normal.  Neck: Normal range of motion. Neck supple. Carotid bruit is not present.  Cardiovascular: Normal rate, regular rhythm and normal heart sounds.  No murmur heard. Pulmonary/Chest: Effort normal and breath sounds normal. She exhibits no mass. Right breast exhibits no mass, no skin change and no tenderness. Left breast exhibits no mass, no skin change and no tenderness. Breasts are symmetrical.  Abdominal: Soft. Bowel sounds are normal. There is no hepatosplenomegaly.  Musculoskeletal: Normal range of motion.  Neurological: She is alert and oriented to person, place, and time.  Skin: No rash noted.  Psychiatric: She has a normal mood and affect. Her behavior is normal. Judgment and thought content normal.    Results for orders placed or performed during the hospital encounter of 06/29/17  I-STAT creatinine  Result Value Ref Range   Creatinine, Ser 1.40 (H) 0.44 - 1.00 mg/dL      Assessment & Plan:   Problem List Items Addressed This Visit      Cardiovascular and Mediastinum   Essential hypertension - Primary    The current medical regimen is effective;  continue present plan and medications.       Relevant Medications   metoprolol succinate (TOPROL-XL) 50 MG 24 hr tablet   lovastatin (MEVACOR) 40 MG tablet   colesevelam (WELCHOL) 625 MG tablet   benazepril (LOTENSIN) 40 MG tablet   Other Relevant Orders   CBC with Differential/Platelet   Comprehensive metabolic panel   Lipid panel   Urinalysis, Routine w reflex microscopic   Bayer DCA Hb A1c Waived     Endocrine   DM type 2 with diabetic peripheral neuropathy (HCC)    Poor control      Relevant Medications   sitaGLIPtin (JANUVIA) 100 MG tablet   pregabalin  (LYRICA) 150 MG capsule   lovastatin (MEVACOR) 40 MG tablet   empagliflozin (JARDIANCE) 25 MG TABS tablet   benazepril (LOTENSIN) 40 MG tablet   Other Relevant Orders   CBC with Differential/Platelet   Comprehensive metabolic panel   Lipid panel   Urinalysis, Routine w reflex microscopic   Bayer DCA Hb A1c Waived     Other   Hyperlipidemia    The current medical regimen is effective;  continue present plan and medications.       Relevant Medications   metoprolol succinate (TOPROL-XL) 50 MG 24 hr tablet   lovastatin (MEVACOR) 40 MG tablet   colesevelam (WELCHOL) 625 MG tablet   benazepril (LOTENSIN) 40 MG tablet   Other Relevant Orders   CBC with Differential/Platelet   Comprehensive metabolic panel   Lipid panel   Urinalysis, Routine w reflex microscopic   Bayer DCA Hb A1c Waived   BMI 50.0-59.9, adult (HCC)    Discussed wt loss      Relevant Medications   sitaGLIPtin (JANUVIA) 100 MG tablet   empagliflozin (JARDIANCE) 25 MG TABS tablet   Back pain   Relevant Medications   traMADol (ULTRAM) 50 MG tablet   Advanced care planning/counseling discussion    A voluntary discussion about advanced care planning including explanation and discussion of advanced directives was extentively discussed with the patient.  Explained about the healthcare proxy and living will was reviewed and packet with forms with expiration of how to fill them out was given.  Time spent: Encounter 16+ min individuals present: Patient and Husband       Other Visit Diagnoses    Thyroid disorder screen       Relevant Orders   TSH   Annual physical exam       Relevant Orders   CBC with Differential/Platelet   Comprehensive metabolic panel   Lipid panel   TSH   Urinalysis, Routine w reflex microscopic   Bayer DCA Hb A1c Waived   Diabetes mellitus without complication (HCC)       Relevant Medications   sitaGLIPtin (JANUVIA) 100 MG tablet   lovastatin (MEVACOR) 40 MG tablet   empagliflozin  (JARDIANCE) 25 MG TABS tablet   benazepril (LOTENSIN) 40 MG tablet   Other Relevant Orders   CBC with Differential/Platelet   Comprehensive metabolic panel   Lipid panel   Bayer DCA Hb A1c Waived       Follow up plan: Return in about 6 months (around 07/04/2018) for Hemoglobin A1c, BMP,  Lipids, ALT, AST.

## 2018-01-03 NOTE — Assessment & Plan Note (Signed)
A voluntary discussion about advanced care planning including explanation and discussion of advanced directives was extentively discussed with the patient.  Explained about the healthcare proxy and living will was reviewed and packet with forms with expiration of how to fill them out was given.  Time spent: Encounter 16+ min individuals present: Patient and Husband

## 2018-01-03 NOTE — Patient Instructions (Signed)
Anna Juarez , Thank you for taking time to come for your Medicare Wellness Visit. I appreciate your ongoing commitment to your health goals. Please review the following plan we discussed and let me know if I can assist you in the future.   Screening recommendations/referrals: Colonoscopy: completed 06/12/2009 Mammogram: due now Please call [phone] to schedule your mammogram.  Bone Density: completed 02/20/2013 Recommended yearly ophthalmology/optometry visit for glaucoma screening and checkup Recommended yearly dental visit for hygiene and checkup  Vaccinations: Influenza vaccine: done today Pneumococcal vaccine: completed series Tetanus/Tdap/TD vaccine: up to date Shingles vaccine: shingrix eligible, check with your insurance company for coverage   Advanced directives: Please bring a copy of your health care power of attorney and living will to the office at your convenience.  Conditions/risks identified: recommend drinking at least 6-8 glasses of water a day   Next appointment: follow up in one year for your annual wellness exam.    Preventive Care 65 Years and Older, Female Preventive care refers to lifestyle choices and visits with your health care provider that can promote health and wellness. What does preventive care include?  A yearly physical exam. This is also called an annual well check.  Dental exams once or twice a year.  Routine eye exams. Ask your health care provider how often you should have your eyes checked.  Personal lifestyle choices, including:  Daily care of your teeth and gums.  Regular physical activity.  Eating a healthy diet.  Avoiding tobacco and drug use.  Limiting alcohol use.  Practicing safe sex.  Taking low-dose aspirin every day.  Taking vitamin and mineral supplements as recommended by your health care provider. What happens during an annual well check? The services and screenings done by your health care provider during your  annual well check will depend on your age, overall health, lifestyle risk factors, and family history of disease. Counseling  Your health care provider may ask you questions about your:  Alcohol use.  Tobacco use.  Drug use.  Emotional well-being.  Home and relationship well-being.  Sexual activity.  Eating habits.  History of falls.  Memory and ability to understand (cognition).  Work and work Statistician.  Reproductive health. Screening  You may have the following tests or measurements:  Height, weight, and BMI.  Blood pressure.  Lipid and cholesterol levels. These may be checked every 5 years, or more frequently if you are over 83 years old.  Skin check.  Lung cancer screening. You may have this screening every year starting at age 72 if you have a 30-pack-year history of smoking and currently smoke or have quit within the past 15 years.  Fecal occult blood test (FOBT) of the stool. You may have this test every year starting at age 46.  Flexible sigmoidoscopy or colonoscopy. You may have a sigmoidoscopy every 5 years or a colonoscopy every 10 years starting at age 14.  Hepatitis C blood test.  Hepatitis B blood test.  Sexually transmitted disease (STD) testing.  Diabetes screening. This is done by checking your blood sugar (glucose) after you have not eaten for a while (fasting). You may have this done every 1-3 years.  Bone density scan. This is done to screen for osteoporosis. You may have this done starting at age 21.  Mammogram. This may be done every 1-2 years. Talk to your health care provider about how often you should have regular mammograms. Talk with your health care provider about your test results, treatment options, and if  necessary, the need for more tests. Vaccines  Your health care provider may recommend certain vaccines, such as:  Influenza vaccine. This is recommended every year.  Tetanus, diphtheria, and acellular pertussis (Tdap, Td)  vaccine. You may need a Td booster every 10 years.  Zoster vaccine. You may need this after age 45.  Pneumococcal 13-valent conjugate (PCV13) vaccine. One dose is recommended after age 35.  Pneumococcal polysaccharide (PPSV23) vaccine. One dose is recommended after age 21. Talk to your health care provider about which screenings and vaccines you need and how often you need them. This information is not intended to replace advice given to you by your health care provider. Make sure you discuss any questions you have with your health care provider. Document Released: 04/19/2015 Document Revised: 12/11/2015 Document Reviewed: 01/22/2015 Elsevier Interactive Patient Education  2017 [place] Prevention in the Home Falls can cause injuries. They can happen to people of all ages. There are many things you can do to make your home safe and to help prevent falls. What can I do on the outside of my home?  Regularly fix the edges of walkways and driveways and fix any cracks.  Remove anything that might make you trip as you walk through a door, such as a raised step or threshold.  Trim any bushes or trees on the path to your home.  Use bright outdoor lighting.  Clear any walking paths of anything that might make someone trip, such as rocks or tools.  Regularly check to see if handrails are loose or broken. Make sure that both sides of any steps have handrails.  Any raised decks and porches should have guardrails on the edges.  Have any leaves, snow, or ice cleared regularly.  Use sand or salt on walking paths during winter.  Clean up any spills in your garage right away. This includes oil or grease spills. What can I do in the bathroom?  Use night lights.  Install grab bars by the toilet and in the tub and shower. Do not use towel bars as grab bars.  Use non-skid mats or decals in the tub or shower.  If you need to sit down in the shower, use a plastic, non-slip  stool.  Keep the floor dry. Clean up any water that spills on the floor as soon as it happens.  Remove soap buildup in the tub or shower regularly.  Attach bath mats securely with double-sided non-slip rug tape.  Do not have throw rugs and other things on the floor that can make you trip. What can I do in the bedroom?  Use night lights.  Make sure that you have a light by your bed that is easy to reach.  Do not use any sheets or blankets that are too big for your bed. They should not hang down onto the floor.  Have a firm chair that has side arms. You can use this for support while you get dressed.  Do not have throw rugs and other things on the floor that can make you trip. What can I do in the kitchen?  Clean up any spills right away.  Avoid walking on wet floors.  Keep items that you use a lot in easy-to-reach places.  If you need to reach something above you, use a strong step stool that has a grab bar.  Keep electrical cords out of the way.  Do not use floor polish or wax that makes floors slippery. If you must  use wax, use non-skid floor wax.  Do not have throw rugs and other things on the floor that can make you trip. What can I do with my stairs?  Do not leave any items on the stairs.  Make sure that there are handrails on both sides of the stairs and use them. Fix handrails that are broken or loose. Make sure that handrails are as long as the stairways.  Check any carpeting to make sure that it is firmly attached to the stairs. Fix any carpet that is loose or worn.  Avoid having throw rugs at the top or bottom of the stairs. If you do have throw rugs, attach them to the floor with carpet tape.  Make sure that you have a light switch at the top of the stairs and the bottom of the stairs. If you do not have them, ask someone to add them for you. What else can I do to help prevent falls?  Wear shoes that:  Do not have high heels.  Have rubber bottoms.  Are  comfortable and fit you well.  Are closed at the toe. Do not wear sandals.  If you use a stepladder:  Make sure that it is fully opened. Do not climb a closed stepladder.  Make sure that both sides of the stepladder are locked into place.  Ask someone to hold it for you, if possible.  Clearly mark and make sure that you can see:  Any grab bars or handrails.  First and last steps.  Where the edge of each step is.  Use tools that help you move around (mobility aids) if they are needed. These include:  Canes.  Walkers.  Scooters.  Crutches.  Turn on the lights when you go into a dark area. Replace any light bulbs as soon as they burn out.  Set up your furniture so you have a clear path. Avoid moving your furniture around.  If any of your floors are uneven, fix them.  If there are any pets around you, be aware of where they are.  Review your medicines with your doctor. Some medicines can make you feel dizzy. This can increase your chance of falling. Ask your doctor what other things that you can do to help prevent falls. This information is not intended to replace advice given to you by your health care provider. Make sure you discuss any questions you have with your health care provider. Document Released: 01/17/2009 Document Revised: 08/29/2015 Document Reviewed: 04/27/2014 Elsevier Interactive Patient Education  2017 [employer].

## 2018-01-03 NOTE — Progress Notes (Signed)
Subjective:   Anna Juarez is a 67 y.o. female who presents for Medicare Annual (Subsequent) preventive examination.  Review of Systems: N/A Cardiac Risk Factors include: hypertension;advanced age (>5men, >58 women);diabetes mellitus;dyslipidemia     Objective:     Vitals: BP 124/70 (BP Location: Left Arm, Patient Position: Sitting, Cuff Size: Normal)   Pulse (!) 55   Temp 97.8 F (36.6 C) (Temporal)   Resp 17   Ht 5\' 2"  (1.575 m)   Wt (!) 306 lb 9.6 oz (139.1 kg)   SpO2 96%   BMI 56.08 kg/m   Body mass index is 56.08 kg/m.  No flowsheet data found.  Tobacco Social History   Tobacco Use  Smoking Status Never Smoker  Smokeless Tobacco Never Used     Counseling given: Not Answered   Clinical Intake:  Pre-visit preparation completed: Yes  Pain : No/denies pain Pain Score: 0-No pain     Nutritional Status: BMI > 30  Obese Nutritional Risks: None Diabetes: Yes CBG done?: No Did pt. bring in CBG monitor from home?: No  Nutrition Risk Assessment:  Has the patient had any N/V/D within the last 2 months?  No  Does the patient have any non-healing wounds?  No  Has the patient had any unintentional weight loss or weight gain?  No   Diabetes:  Is the patient diabetic?  Yes  If diabetic, was a CBG obtained today?  No , a1c drawn in lab Did the patient bring in their glucometer from home?  No  How often do you monitor your CBG's? As needed, couple times a month.   Financial Strains and Diabetes Management:  Are you having any financial strains with the device, your supplies or your medication? No .   Would the patient like to be referred to a Nutritionist or for Diabetic Management?  No  declined  Diabetic Exams:  Diabetic Eye Exam: Completed 03/03/2017. Pt has been advised about the importance in completing this exam.   Diabetic Foot Exam: due now. Pt has been advised about the importance in completing this exam. Pt is scheduled for diabetic foot  exam today with Dr.Crissman.  How often do you need to have someone help you when you read instructions, pamphlets, or other written materials from your doctor or pharmacy?: 1 - Never What is the last grade level you completed in school?: 12th grade  Interpreter Needed?: No  Information entered by :: Tiffany Hill,LPN   Past Medical History:  Diagnosis Date  . Chronic kidney disease   . Diabetes mellitus without complication ([place])   . Extreme obesity   . Fatty liver   . Hyperlipidemia   . Hypertension   . Radiculopathy, cervical   . Renal insufficiency    pt states she has chronic kidney disease.    Past Surgical History:  Procedure Laterality Date  . ABDOMINAL HYSTERECTOMY    . JOINT REPLACEMENT    . SPINE SURGERY     Family History  Problem Relation Age of Onset  . Diabetes Mother   . Heart disease Mother   . Hypertension Mother   . Heart disease Father   . Cancer Brother   . Hypertension Brother   . Breast cancer Maternal Aunt   . Breast cancer Paternal Aunt    Social History   Socioeconomic History  . Marital status: Married    Spouse name: Not on file  . Number of children: Not on file  . Years of education: Not on  file  . Highest education level: 12th grade  Occupational History  . Not on file  Social Needs  . Financial resource strain: Not hard at all  . Food insecurity:    Worry: Never true    Inability: Never true  . Transportation needs:    Medical: No    Non-medical: No  Tobacco Use  . Smoking status: Never Smoker  . Smokeless tobacco: Never Used  Substance and Sexual Activity  . Alcohol use: No  . Drug use: No  . Sexual activity: Not on file  Lifestyle  . Physical activity:    Days per week: 0 days    Minutes per session: 0 min  . Stress: Not at all  Relationships  . Social connections:    Talks on phone: More than three times a week    Gets together: More than three times a week    Attends religious service: More than 4 times per  year    Active member of club or organization: No    Attends meetings of clubs or organizations: Never    Relationship status: Married  Other Topics Concern  . Not on file  Social History Narrative  . Not on file    Outpatient Encounter Medications as of 01/03/2018  Medication Sig  . benazepril (LOTENSIN) 40 MG tablet TAKE 1 TABLET BY MOUTH EVERY DAY  . colesevelam (WELCHOL) 625 MG tablet Take 3 tablets (1,875 mg total) by mouth 2 (two) times daily.  . fluconazole (DIFLUCAN) 200 MG tablet Take 200 mg by mouth once a week.  Marland Kitchen JARDIANCE 25 MG TABS tablet TAKE 25 MG (1 TABLET) BY MOUTH DAILY.  Marland Kitchen lovastatin (MEVACOR) 40 MG tablet TAKE 1 TABLET BY MOUTH EVERYDAY AT BEDTIME  . LYRICA 150 MG capsule TAKE 1 CAPSULE BY MOUTH 3 TIMES DAILY  . metoprolol succinate (TOPROL-XL) 50 MG 24 hr tablet TAKE 1 TABLET (50 MG TOTAL) BY MOUTH DAILY. TAKE WITH OR IMMEDIATELY FOLLOWING A MEAL.  Marland Kitchen NEXIUM 40 MG capsule TAKE 1 CAPSULE (40 MG TOTAL) BY MOUTH DAILY.  . sitaGLIPtin (JANUVIA) 100 MG tablet Take 1 tablet (100 mg total) by mouth daily.  . traMADol (ULTRAM) 50 MG tablet Take 1 tablet (50 mg total) by mouth every 8 (eight) hours as needed. (Patient not taking: Reported on 01/03/2018)   No facility-administered encounter medications on file as of 01/03/2018.     Activities of Daily Living In your present state of health, do you have any difficulty performing the following activities: 01/03/2018  Hearing? N  Comment no hearing aids   Vision? Y  Comment has some catracts, wears glasses  Difficulty concentrating or making decisions? Y  Comment sometimes it comes back   Walking or climbing stairs? Y  Comment knee pain   Dressing or bathing? N  Doing errands, shopping? N  Preparing Food and eating ? N  Using the Toilet? N  In the past six months, have you accidently leaked urine? N  Do you have problems with loss of bowel control? N  Managing your Medications? N  Managing your Finances? N    Housekeeping or managing your Housekeeping? N  Some recent data might be hidden    Patient Care Team: Guadalupe Maple, MD as PCP - General (Family Medicine) Lucilla Lame, MD as Consulting Physician (Gastroenterology) [name], MD as Consulting Physician (Orthopedic Surgery)    Assessment:   This is a routine wellness examination for Sulema.  Exercise Activities and Dietary recommendations Current Exercise  Habits: The patient does not participate in regular exercise at present, Exercise limited by: None identified  Goals    . DIET - INCREASE WATER INTAKE     Recommend drinking at least 6-8 glasses of water a day        Fall Risk Fall Risk  01/03/2018 05/26/2017 04/08/2017 11/24/2016 08/24/2016  Falls in the past year? No No No No No   FALL RISK PREVENTION PERTAINING TO THE HOME:  Any stairs in or around the home WITH handrails? No  has ramp  Home free of loose throw rugs in walkways, pet beds, electrical cords, etc? Yes  Adequate lighting in your home to reduce risk of falls? Yes   ASSISTIVE DEVICES UTILIZED TO PREVENT FALLS:  Life alert? No  Use of a cane, walker or w/c? No  Grab bars in the bathroom? Yes  Shower chair or bench in shower? Yes doesn't use Elevated toilet seat or a handicapped toilet? Yes  doesn't use   DME ORDERS:  DME order needed?  No   TIMED UP AND GO:  Was the test performed? Yes .  Length of time to ambulate 10 feet: 8 sec.   GAIT:  Appearance of gait: Gait stead-fast and steady without the use of an assistive device.  Education: Fall risk prevention has been discussed.  Intervention(s) required? No    Depression Screen PHQ 2/9 Scores 01/03/2018 05/26/2017 11/24/2016 07/22/2016  PHQ - 2 Score 0 0 0 0     Cognitive Function     6CIT Screen 01/03/2018  What Year? 0 points  What month? 0 points  What time? 0 points  Count back from 20 0 points  Months in reverse 0 points  Repeat phrase 0 points  Total Score 0    Immunization  History  Administered Date(s) Administered  . Influenza, High Dose Seasonal PF 01/07/2016, 01/19/2017, 01/03/2018  . Influenza,inj,Quad PF,6+ Mos 01/02/2015  . Influenza-Unspecified 02/28/2014, 01/19/2017  . Pneumococcal Conjugate-13 02/28/2014  . Pneumococcal Polysaccharide-23 10/31/2015  . Pneumococcal-Unspecified 11/14/2004  . Tdap 09/13/2013    Qualifies for Shingles Vaccine?Yes  Zostavax completed - unsure of date. Due for Shingrix. Education has been provided regarding the importance of this vaccine. Pt has been advised to call insurance company to determine out of pocket expense. Advised may also receive vaccine at local pharmacy or Health Dept. Verbalized acceptance and understanding.  Tetanus: up to date   Flu Vaccine: Due for Flu vaccine. Does the patient want to receive this vaccine today?  Yes , completed   Pneumococcal Vaccine: completed series  Screening Tests Health Maintenance  Topic Date Due  . FOOT EXAM  10/30/2016  . MAMMOGRAM  11/06/2017  . HEMOGLOBIN A1C  11/23/2017  . INFLUENZA VACCINE  01/03/2018 (Originally 11/04/2017)  . OPHTHALMOLOGY EXAM  03/03/2018  . COLONOSCOPY  06/13/2019  . TETANUS/TDAP  09/14/2023  . DEXA SCAN  Completed  . Hepatitis C Screening  Completed  . PNA vac Low Risk Adult  Completed   Cancer Screenings:  Colorectal Screening: Completed 06/12/2009. Repeat every 10 years  Mammogram: Completed 11/07/2015. Repeat every year, Ordered today  . Pt provided with contact info and advised to call to schedule appt.  Bone Density: Completed 02/20/2013.   Lung Cancer Screening: (Low Dose CT Chest recommended if Age 50-80 years, 30 pack-year currently smoking OR have quit w/in 15years.) does not qualify.    Additional Screening:  Hepatitis C Screening: does qualify; Completed 10/31/2015  Dental Screening: Recommended annual dental exams for proper  oral hygiene  Community Resource Referral:  CRR required this visit?  No      Plan:    I have  personally reviewed and addressed the Medicare Annual Wellness questionnaire and have noted the following in the patient's chart:  A. Medical and social history B. Use of alcohol, tobacco or illicit drugs  C. Current medications and supplements D. Functional ability and status E.  Nutritional status F.  Physical activity G. Advance directives H. List of other physicians I.  Hospitalizations, surgeries, and ER visits in previous 12 months J.  [place] such as hearing and vision if needed, cognitive and depression L. Referrals and appointments   In addition, I have reviewed and discussed with patient certain preventive protocols, quality metrics, and best practice recommendations. A written personalized care plan for preventive services as well as general preventive health recommendations were provided to patient.   Signed,  Tyler Aas, LPN Nurse Health Advisor   Nurse Notes: due for diabetic foot exam, cpe today with Dr.Crissman.

## 2018-01-03 NOTE — Assessment & Plan Note (Signed)
The current medical regimen is effective;  continue present plan and medications.  

## 2018-01-03 NOTE — Assessment & Plan Note (Signed)
Poor control

## 2018-01-03 NOTE — Assessment & Plan Note (Signed)
Discussed wt loss 

## 2018-01-04 ENCOUNTER — Telehealth: Payer: Self-pay | Admitting: Family Medicine

## 2018-01-04 DIAGNOSIS — N183 Chronic kidney disease, stage 3 unspecified: Secondary | ICD-10-CM | POA: Insufficient documentation

## 2018-01-04 LAB — CBC WITH DIFFERENTIAL/PLATELET
BASOS: 0 %
Basophils Absolute: 0 10*3/uL (ref 0.0–0.2)
EOS (ABSOLUTE): 0.1 10*3/uL (ref 0.0–0.4)
EOS: 2 %
Hematocrit: 43.1 % (ref 34.0–46.6)
Hemoglobin: 13.9 g/dL (ref 11.1–15.9)
IMMATURE GRANULOCYTES: 0 %
Immature Grans (Abs): 0 10*3/uL (ref 0.0–0.1)
Lymphocytes Absolute: 1.8 10*3/uL (ref 0.7–3.1)
Lymphs: 24 %
MCH: 29.8 pg (ref 26.6–33.0)
MCHC: 32.3 g/dL (ref 31.5–35.7)
MCV: 93 fL (ref 79–97)
Monocytes Absolute: 0.6 10*3/uL (ref 0.1–0.9)
Monocytes: 9 %
NEUTROS PCT: 65 %
Neutrophils Absolute: 4.8 10*3/uL (ref 1.4–7.0)
PLATELETS: 190 10*3/uL (ref 150–450)
RBC: 4.66 x10E6/uL (ref 3.77–5.28)
RDW: 14.4 % (ref 12.3–15.4)
WBC: 7.3 10*3/uL (ref 3.4–10.8)

## 2018-01-04 LAB — COMPREHENSIVE METABOLIC PANEL
A/G RATIO: 1.6 (ref 1.2–2.2)
ALBUMIN: 4.6 g/dL (ref 3.6–4.8)
ALT: 49 IU/L — AB (ref 0–32)
AST: 91 IU/L — ABNORMAL HIGH (ref 0–40)
Alkaline Phosphatase: 59 IU/L (ref 39–117)
BUN/Creatinine Ratio: 15 (ref 12–28)
BUN: 24 mg/dL (ref 8–27)
Bilirubin Total: 0.4 mg/dL (ref 0.0–1.2)
CO2: 23 mmol/L (ref 20–29)
Calcium: 9.4 mg/dL (ref 8.7–10.3)
Chloride: 99 mmol/L (ref 96–106)
Creatinine, Ser: 1.58 mg/dL — ABNORMAL HIGH (ref 0.57–1.00)
GFR calc Af Amer: 38 mL/min/{1.73_m2} — ABNORMAL LOW (ref >59)
GFR, EST NON AFRICAN AMERICAN: 33 mL/min/{1.73_m2} — AB (ref >59)
Globulin, Total: 2.8 g/dL (ref 1.5–4.5)
Glucose: 157 mg/dL — ABNORMAL HIGH (ref 65–99)
POTASSIUM: 4.8 mmol/L (ref 3.5–5.2)
Sodium: 139 mmol/L (ref 134–144)
TOTAL PROTEIN: 7.4 g/dL (ref 6.0–8.5)

## 2018-01-04 LAB — LIPID PANEL
CHOL/HDL RATIO: 3.4 ratio (ref 0.0–4.4)
CHOLESTEROL TOTAL: 161 mg/dL (ref 100–199)
HDL: 48 mg/dL (ref >39)
LDL Calculated: 70 mg/dL (ref 0–99)
TRIGLYCERIDES: 216 mg/dL — AB (ref 0–149)
VLDL Cholesterol Cal: 43 mg/dL — ABNORMAL HIGH (ref 5–40)

## 2018-01-04 LAB — TSH: TSH: 2.73 u[IU]/mL (ref 0.450–4.500)

## 2018-01-04 NOTE — Telephone Encounter (Signed)
-----   Message from Georgina Peer, [place] sent at 01/04/2018 12:00 PM EDT ----- Phone call.

## 2018-01-04 NOTE — Telephone Encounter (Signed)
Phone call Discussed with patient declining renal function patient denies aspirin Advil Aleve usage.  Will refer to nephrology to further evaluate. Discussed elevated liver enzymes patient declines using liver toxic agents such as Tylenol or other type medications Previous liver enzymes all normal will recheck liver function 3 months encourage weight loss is most likely secondary to fatty liver.

## 2018-01-04 NOTE — Telephone Encounter (Signed)
-----   Message from Georgina Peer, [place] sent at 01/04/2018 12:00 PM EDT ----- Phone call.

## 2018-01-04 NOTE — Assessment & Plan Note (Signed)
Decline in renal function will refer to nephrology

## 2018-01-12 ENCOUNTER — Other Ambulatory Visit: Payer: Self-pay | Admitting: Nephrology

## 2018-01-12 DIAGNOSIS — N183 Chronic kidney disease, stage 3 unspecified: Secondary | ICD-10-CM

## 2018-01-14 ENCOUNTER — Telehealth: Payer: Self-pay

## 2018-01-14 NOTE — Telephone Encounter (Signed)
Tried to contact patient to let her know that she can call [facility] at [phone] to schedule her mammogram, the order has already been placed.

## 2018-01-18 ENCOUNTER — Ambulatory Visit
Admission: RE | Admit: 2018-01-18 | Discharge: 2018-01-18 | Disposition: A | Discharge status: Home / Self Care | Payer: Medicare Other | Source: Ambulatory Visit | Attending: Nephrology | Admitting: Nephrology

## 2018-01-18 DIAGNOSIS — N183 Chronic kidney disease, stage 3 unspecified: Secondary | ICD-10-CM

## 2018-01-18 NOTE — Telephone Encounter (Signed)
Patient notified

## 2018-01-24 ENCOUNTER — Ambulatory Visit
Admission: RE | Admit: 2018-01-24 | Discharge: 2018-01-24 | Disposition: A | Discharge status: Home / Self Care | Payer: Medicare Other | Source: Ambulatory Visit | Attending: Family Medicine | Admitting: Family Medicine

## 2018-01-24 DIAGNOSIS — Z1231 Encounter for screening mammogram for malignant neoplasm of breast: Secondary | ICD-10-CM | POA: Diagnosis present

## 2018-01-24 DIAGNOSIS — Z0000 Encounter for general adult medical examination without abnormal findings: Secondary | ICD-10-CM

## 2018-02-08 ENCOUNTER — Encounter: Payer: Self-pay | Admitting: Internal Medicine

## 2018-02-08 ENCOUNTER — Inpatient Hospital Stay: Payer: Medicare Other | Attending: Internal Medicine | Admitting: Internal Medicine

## 2018-02-08 ENCOUNTER — Inpatient Hospital Stay: Payer: Medicare Other

## 2018-02-08 DIAGNOSIS — N183 Chronic kidney disease, stage 3 (moderate): Secondary | ICD-10-CM | POA: Insufficient documentation

## 2018-02-08 DIAGNOSIS — I10 Essential (primary) hypertension: Secondary | ICD-10-CM | POA: Diagnosis not present

## 2018-02-08 DIAGNOSIS — Z79899 Other long term (current) drug therapy: Secondary | ICD-10-CM | POA: Insufficient documentation

## 2018-02-08 DIAGNOSIS — D472 Monoclonal gammopathy: Secondary | ICD-10-CM

## 2018-02-08 NOTE — Assessment & Plan Note (Addendum)
#  Incidental M protein noted-during the work-up of chronic kidney disease.  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  Also discussed small risk of progression to multiple myeloma. Patient is less likely at this time patient has any active myeloma-although given renal insufficiency [see discussion below]  #Chronic kidney disease creatinine 1.58.  Stable.  Followed by nephrology.  #Recommend checking labs today monoclonal protein. Skeletal survey.  Thank you Dr.[name] for allowing me to participate in the care of your pleasant patient. Please do not hesitate to contact me with questions or concerns in the interim.  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  Recommend follow-up in 6 months/labs 1 week prior. Pt will be informed.

## 2018-02-08 NOTE — Progress Notes (Signed)
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     HISTORY OF PRESENTING ILLNESS:  Anna Juarez 67 y.o.  female the history of chronic kidney disease was recently evaluated by nephrology.  As part of the work-up patient was noted to have slightly abnormal M protein.  The patient has been referred to [place] for further evaluation.  Patient denies any new onset of back pain.  Has had chronic back pain.  Patient denies any new onset of tingling or numbness.  No nausea no vomiting.  No stools no black or stools.   Review of Systems  Constitutional: Negative for chills, diaphoresis, fever, malaise/fatigue and weight loss.  HENT: Negative for nosebleeds and sore throat.   Eyes: Negative for double vision.  Respiratory: Negative for cough, hemoptysis, sputum production, shortness of breath and wheezing.   Cardiovascular: Negative for chest pain, palpitations, orthopnea and leg swelling.  Gastrointestinal: Negative for abdominal pain, blood in stool, constipation, diarrhea, heartburn, melena, nausea and vomiting.  Genitourinary: Negative for dysuria, frequency and urgency.  Musculoskeletal: Positive for back pain and joint pain.  Skin: Negative.  Negative for itching and rash.  Neurological: Negative for dizziness, tingling, focal weakness, weakness and headaches.  Endo/Heme/Allergies: Does not bruise/bleed easily.  Psychiatric/Behavioral: Negative for depression. The patient is not nervous/anxious and does not have insomnia.      MEDICAL HISTORY:  Past Medical History:  Diagnosis Date   . Chronic kidney disease   . Diabetes mellitus without complication ([place])   . Extreme obesity   . Fatty liver   . Hyperlipidemia   . Hypertension   . Radiculopathy, cervical   . Renal insufficiency    pt states she has chronic kidney disease.     SURGICAL HISTORY: Past Surgical History:  Procedure Laterality Date  . ABDOMINAL HYSTERECTOMY    . JOINT REPLACEMENT    . SPINE SURGERY      SOCIAL HISTORY: Social History   Socioeconomic History  . Marital status: Married    Spouse name: Not on file  . Number of children: Not on file  . Years of education: Not on file  . Highest education level: 12th grade  Occupational History  . Not on file  Social Needs  . Financial resource strain: Not hard at all  . Food insecurity:    Worry: Never true    Inability: Never true  . Transportation needs:    Medical: No    Non-medical: No  Tobacco Use  . Smoking status: Never Smoker  . Smokeless tobacco: Never Used  Substance and Sexual Activity  . Alcohol use: No  . Drug use: No  . Sexual activity: Not on file  Lifestyle  . Physical activity:    Days per week: 0 days    Minutes per session: 0 min  . Stress: Not at all  Relationships  . Social connections:    Talks on phone: More than three times a week    Gets together: More than three times a week    Attends religious service: More than 4 times per year    Active member of club or organization: No  Attends meetings of clubs or organizations: Never    Relationship status: Married  . Intimate partner violence:    Fear of current or ex partner: No    Emotionally abused: No    Physically abused: No    Forced sexual activity: No  Other Topics Concern  . Not on file  Social History Narrative   [place]; lives with husband; never smoker; no alcohol; retd. Manufacturing.     FAMILY HISTORY: Family History  Problem Relation Age of Onset  . Diabetes Mother   . Heart disease Mother   . Hypertension Mother    . Heart disease Father   . Cancer Brother   . Hypertension Brother   . Breast cancer Maternal Aunt   . Breast cancer Paternal Aunt     ALLERGIES:  is allergic to actos [pioglitazone]; metformin and related; and victoza [liraglutide].  MEDICATIONS:  Current Outpatient Medications  Medication Sig Dispense Refill  . benazepril (LOTENSIN) 40 MG tablet Take 1 tablet (40 mg total) by mouth daily. 90 tablet 4  . colesevelam (WELCHOL) 625 MG tablet Take 3 tablets (1,875 mg total) by mouth 2 (two) times daily. 540 tablet 4  . empagliflozin (JARDIANCE) 25 MG TABS tablet Take 25 mg by mouth daily. 90 tablet 4  . fluconazole (DIFLUCAN) 200 MG tablet Take 200 mg by mouth once a week.  11  . lovastatin (MEVACOR) 40 MG tablet Take 1 tablet (40 mg total) by mouth at bedtime. 90 tablet 4  . metoprolol succinate (TOPROL-XL) 50 MG 24 hr tablet Take 1 tablet (50 mg total) by mouth daily. Take with or immediately following a meal. 90 tablet 4  . NEXIUM 40 MG capsule Take 1 capsule (40 mg total) by mouth daily. 90 capsule 4  . pregabalin (LYRICA) 150 MG capsule Take 1 capsule (150 mg total) by mouth 3 (three) times daily. 270 capsule 1  . sitaGLIPtin (JANUVIA) 100 MG tablet Take 1 tablet (100 mg total) by mouth daily. 90 tablet 4  . traMADol (ULTRAM) 50 MG tablet Take 1 tablet (50 mg total) by mouth every 8 (eight) hours as needed. 30 tablet 3   No current facility-administered medications for this visit.       Marland Kitchen  PHYSICAL EXAMINATION: ECOG PERFORMANCE STATUS: 0 - Asymptomatic  Vitals:   02/08/18 1111 02/08/18 1116  BP:  119/63  Pulse:  (!) 50  Resp: 16 16  Temp:  98.4 F (36.9 C)   Filed Weights   02/08/18 1111  Weight: 299 lb 13.2 oz (136 kg)    Physical Exam  Constitutional: She is oriented to person, place, and time and well-developed, well-nourished, and in no distress.  HENT:  Head: Normocephalic and atraumatic.  Mouth/Throat: Oropharynx is clear and moist. No oropharyngeal  exudate.  Eyes: Pupils are equal, round, and reactive to light.  Neck: Normal range of motion. Neck supple.  Cardiovascular: Normal rate and regular rhythm.  Pulmonary/Chest: No respiratory distress. She has no wheezes.  Abdominal: Soft. Bowel sounds are normal. She exhibits no distension and no mass. There is no tenderness. There is no rebound and no guarding.  Musculoskeletal: Normal range of motion. She exhibits no edema or tenderness.  Neurological: She is alert and oriented to person, place, and time.  Skin: Skin is warm.  Psychiatric: Affect normal.     LABORATORY DATA:  I have reviewed the data as listed Lab Results  Component Value Date   WBC 7.3 01/03/2018   HGB 13.9  01/03/2018   HCT 43.1 01/03/2018   MCV 93 01/03/2018   PLT 190 01/03/2018   Recent Labs    05/26/17 1413 05/26/17 1440 06/29/17 1305 01/03/18 1537  NA  --  142  --  139  K  --  4.5  --  4.8  CL  --  104  --  99  CO2  --  23  --  23  GLUCOSE  --  128*  --  157*  BUN  --  25  --  24  CREATININE  --  1.45* 1.40* 1.58*  CALCIUM  --  9.0  --  9.4  GFRNONAA  --  37*  --  33*  GFRAA  --  43*  --  38*  PROT  --   --   --  7.4  ALBUMIN  --   --   --  4.6  AST 34  --   --  91*  ALT 37  --   --  49*  ALKPHOS  --   --   --  59  BILITOT  --   --   --  0.4    RADIOGRAPHIC STUDIES: I have personally reviewed the radiological images as listed and agreed with the findings in the report. US Renal  Result Date: 01/18/2018 CLINICAL DATA:  Chronic renal insufficiency stage III. History of diabetes, hypertension EXAM: RENAL / URINARY TRACT ULTRASOUND COMPLETE COMPARISON:  Abdominal and pelvic CT scan of December 30, 2009 FINDINGS: Right Kidney: Length: 11.8 cm. The renal cortical echotexture remains lower than that of the liver. There is no significant cortical thinning. There is no focal mass nor hydronephrosis. Left Kidney: Length: 10.3 cm. The renal cortical echotexture is similar to that on the right. There is  no cortical thinning. There is no focal mass nor hydronephrosis. Bladder: Appears normal for degree of bladder distention. IMPRESSION: Normal renal ultrasound examination. Electronically Signed   By: David  Martinique M.D.   On: 01/18/2018 13:29   Dg Bone Survey Met  Result Date: 02/10/2018 CLINICAL DATA:  Monoclonal gammopathy of uncertain significance, multiple myeloma with diabetes mellitus, hypertension EXAM: METASTATIC BONE SURVEY COMPARISON:  Chest radiograph 05/26/2017 FINDINGS: Borderline enlargement of cardiac silhouette. Mediastinal contours and pulmonary vascularity normal. Lungs clear. No infiltrate, pleural effusion or pneumothorax. Osseous mineralization normal. Prior cervical spine fusion C4-C6. Scattered facet degenerative changes cervical spine. Degenerative disc and facet disease changes of the lumbar spine. BILATERAL knee prostheses. No definite lytic or destructive bone lesions are identified to suggest multiple myeloma. IMPRESSION: No radiographic findings to suggest multiple myeloma. Electronically Signed   By: Lavonia Dana M.D.   On: 02/10/2018 16:40   Mm 3d Screen Breast Bilateral  Result Date: 01/24/2018 CLINICAL DATA:  Screening. EXAM: DIGITAL SCREENING BILATERAL MAMMOGRAM WITH TOMO AND CAD COMPARISON:  Previous exam(s). ACR Breast Density Category b: There are scattered areas of fibroglandular density. FINDINGS: There are no findings suspicious for malignancy. Images were processed with CAD. IMPRESSION: No mammographic evidence of malignancy. A result letter of this screening mammogram will be mailed directly to the patient. RECOMMENDATION: Screening mammogram in one year. (Code:SM-B-01Y) BI-RADS CATEGORY  1: Negative. Electronically Signed   By: Margarette Canada M.D.   On: 01/24/2018 12:13    ASSESSMENT & PLAN:   MGUS (monoclonal gammopathy of unknown significance) # Incidental M protein noted-during the work-up of chronic kidney disease.  I had a long discussion with the patient and  family regarding MGUS/importance of MGUS.  Also discussed small  risk of progression to multiple myeloma. Patient is less likely at this time patient has any active myeloma-although given renal insufficiency [see discussion below]  #Chronic kidney disease creatinine 1.58.  Stable.  Followed by nephrology.  #Recommend checking labs today monoclonal protein. Skeletal survey.  Thank you Dr.[name] for allowing me to participate in the care of your pleasant patient. Please do not hesitate to contact me with questions or concerns in the interim.  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  Recommend follow-up in 6 months/labs 1 week prior. Pt will be informed.    All questions were answered. The patient knows to call the clinic with any problems, questions or concerns.    Cammie Sickle, MD 02/13/2018 7:13 PM

## 2018-02-09 LAB — KAPPA/LAMBDA LIGHT CHAINS
KAPPA FREE LGHT CHN: 21.8 mg/L — AB (ref 3.3–19.4)
Kappa, lambda light chain ratio: 0.62 (ref 0.26–1.65)
Lambda free light chains: 34.9 mg/L — ABNORMAL HIGH (ref 5.7–26.3)

## 2018-02-10 ENCOUNTER — Ambulatory Visit
Admission: RE | Admit: 2018-02-10 | Discharge: 2018-02-10 | Disposition: A | Discharge status: Home / Self Care | Payer: Medicare Other | Source: Ambulatory Visit | Attending: Internal Medicine | Admitting: Internal Medicine

## 2018-02-10 DIAGNOSIS — D472 Monoclonal gammopathy: Secondary | ICD-10-CM | POA: Insufficient documentation

## 2018-02-10 LAB — MULTIPLE MYELOMA PANEL, SERUM
ALBUMIN SERPL ELPH-MCNC: 3.9 g/dL (ref 2.9–4.4)
ALPHA 1: 0.2 g/dL (ref 0.0–0.4)
ALPHA2 GLOB SERPL ELPH-MCNC: 0.9 g/dL (ref 0.4–1.0)
Albumin/Glob SerPl: 1.2 (ref 0.7–1.7)
B-Globulin SerPl Elph-Mcnc: 1.5 g/dL — ABNORMAL HIGH (ref 0.7–1.3)
Gamma Glob SerPl Elph-Mcnc: 0.9 g/dL (ref 0.4–1.8)
Globulin, Total: 3.4 g/dL (ref 2.2–3.9)
IGM (IMMUNOGLOBULIN M), SRM: 73 mg/dL (ref 26–217)
IgA: 539 mg/dL — ABNORMAL HIGH (ref 87–352)
IgG (Immunoglobin G), Serum: 1034 mg/dL (ref 700–1600)
M Protein SerPl Elph-Mcnc: 0.2 g/dL — ABNORMAL HIGH
TOTAL PROTEIN ELP: 7.3 g/dL (ref 6.0–8.5)

## 2018-02-13 ENCOUNTER — Telehealth: Payer: Self-pay | Admitting: Internal Medicine

## 2018-02-13 DIAGNOSIS — D472 Monoclonal gammopathy: Secondary | ICD-10-CM

## 2018-02-13 NOTE — Telephone Encounter (Signed)
My chart message sent.  # Anna Juarez/ Anna Juarez- Recommend follow-up in 6 months-labs [please order multiple myeloma/kappa lambda light chains/ CBC BMP]- 1 week prior. Dr.B 

## 2018-02-14 NOTE — Addendum Note (Signed)
Addended by: Sandria Bales B on: 02/14/2018 08:40 AM   Modules accepted: Orders

## 2018-03-21 ENCOUNTER — Ambulatory Visit: Payer: Medicare Other | Admitting: Family Medicine

## 2018-03-21 ENCOUNTER — Encounter: Payer: Self-pay | Admitting: Family Medicine

## 2018-03-21 VITALS — BP 113/74 | HR 63 | Temp 98.3°F | Wt 301.0 lb

## 2018-03-21 DIAGNOSIS — E785 Hyperlipidemia, unspecified: Secondary | ICD-10-CM

## 2018-03-21 DIAGNOSIS — I10 Essential (primary) hypertension: Secondary | ICD-10-CM | POA: Diagnosis not present

## 2018-03-21 DIAGNOSIS — M545 Low back pain: Secondary | ICD-10-CM | POA: Diagnosis not present

## 2018-03-21 DIAGNOSIS — E1142 Type 2 diabetes mellitus with diabetic polyneuropathy: Secondary | ICD-10-CM

## 2018-03-21 DIAGNOSIS — G8929 Other chronic pain: Secondary | ICD-10-CM

## 2018-03-21 LAB — LP+ALT+AST PICCOLO, WAIVED
ALT (SGPT) Piccolo, Waived: 45 U/L (ref 10–47)
AST (SGOT) PICCOLO, WAIVED: 59 U/L — AB (ref 11–38)
Chol/HDL Ratio Piccolo,Waive: 2.6 mg/dL
Cholesterol Piccolo, Waived: 150 mg/dL (ref <200)
HDL Chol Piccolo, Waived: 58 mg/dL (ref >59)
LDL Chol Calc Piccolo Waived: 35 mg/dL (ref <100)
Triglycerides Piccolo,Waived: 279 mg/dL — ABNORMAL HIGH (ref <150)
VLDL Chol Calc Piccolo,Waive: 56 mg/dL — ABNORMAL HIGH (ref <30)

## 2018-03-21 LAB — BAYER DCA HB A1C WAIVED: HB A1C (BAYER DCA - WAIVED): 6.8 % (ref <7.0)

## 2018-03-21 MED ORDER — PREGABALIN 150 MG PO CAPS: 150.0000 mg | ORAL_CAPSULE | Freq: Three times a day (TID) | ORAL | 1 refills | Status: DC

## 2018-03-21 MED ORDER — TRAMADOL HCL 50 MG PO TABS: 50.0000 mg | ORAL_TABLET | Freq: Three times a day (TID) | ORAL | 3 refills | Status: DC | PRN

## 2018-03-21 NOTE — Assessment & Plan Note (Signed)
Discussed diabetes have stopped Jardiance and low sugar spells of abated.  Patient will check her glucose during these spells to make sure she is having low sugar and not just hungry. If needed may need to cut Januvia in half.

## 2018-03-21 NOTE — Assessment & Plan Note (Signed)
The current medical regimen is effective;  continue present plan and medications.  

## 2018-03-21 NOTE — Progress Notes (Signed)
BP 113/74   Pulse 63   Temp 98.3 F (36.8 C) (Oral)   Wt (!) 301 lb (136.5 kg)   SpO2 99%   BMI 55.05 kg/m    Subjective:    Patient ID: Anna Juarez, female    DOB: 04-27-1950, 67 y.o.   MRN: [id number]  HPI: Anna Juarez is a 67 y.o. female  Chief Complaint  Patient presents with  . Follow-up  . Cyst    Right side of neck  . Fatigue    "feel like I could go to sleep and never wake up"  Patient trying to lose some weight and having low blood sugar spells especially at lunchtime and in the early afternoon.  Has cut back on Jardiance and still having some symptoms has cut out white starchy stuff. Patient followed by nephrology for kidney failure and trying to stabilize and especially lose weight and maintain blood pressure to maintain her renal function as long as she can.  Relevant past medical, surgical, family and social history reviewed and updated as indicated. Interim medical history since our last visit reviewed. Allergies and medications reviewed and updated.  Review of Systems  Constitutional: Negative.   Respiratory: Negative.   Cardiovascular: Negative.     Per HPI unless specifically indicated above     Objective:    BP 113/74   Pulse 63   Temp 98.3 F (36.8 C) (Oral)   Wt (!) 301 lb (136.5 kg)   SpO2 99%   BMI 55.05 kg/m   Wt Readings from Last 3 Encounters:  03/21/18 (!) 301 lb (136.5 kg)  02/08/18 299 lb 13.2 oz (136 kg)  01/03/18 (!) 306 lb 9.6 oz (139.1 kg)    Physical Exam Constitutional:      Appearance: She is well-developed.  HENT:     Head: Normocephalic and atraumatic.  Eyes:     Conjunctiva/sclera: Conjunctivae normal.  Neck:     Musculoskeletal: Normal range of motion and neck supple. No neck rigidity or muscular tenderness.     Vascular: No carotid bruit.     Comments: No lesion in area of discomfort Cardiovascular:     Rate and Rhythm: Normal rate and regular rhythm.     Heart sounds: Normal heart sounds.    Pulmonary:     Effort: Pulmonary effort is normal.     Breath sounds: Normal breath sounds.  Musculoskeletal: Normal range of motion.  Lymphadenopathy:     Cervical: No cervical adenopathy.  Skin:    Findings: No erythema.  Neurological:     Mental Status: She is alert and oriented to person, place, and time.  Psychiatric:        Behavior: Behavior normal.        Thought Content: Thought content normal.        Judgment: Judgment normal.     Results for orders placed or performed in visit on 02/08/18  Kappa/lambda light chains  Result Value Ref Range   Kappa free light chain 21.8 (H) 3.3 - 19.4 mg/L   Lamda free light chains 34.9 (H) 5.7 - 26.3 mg/L   Kappa, lamda light chain ratio 0.62 0.26 - 1.65  Multiple Myeloma Panel (SPEP&IFE w/QIG)  Result Value Ref Range   IgG (Immunoglobin G), Serum 1,034 700 - 1,600 mg/dL   IgA 539 (H) 87 - 352 mg/dL   IgM (Immunoglobulin M), Srm 73 26 - 217 mg/dL   Total Protein ELP 7.3 6.0 - 8.5 g/dL   Albumin  SerPl Elph-Mcnc 3.9 2.9 - 4.4 g/dL   Alpha 1 0.2 0.0 - 0.4 g/dL   Alpha2 Glob SerPl Elph-Mcnc 0.9 0.4 - 1.0 g/dL   B-Globulin SerPl Elph-Mcnc 1.5 (H) 0.7 - 1.3 g/dL   Gamma Glob SerPl Elph-Mcnc 0.9 0.4 - 1.8 g/dL   M Protein SerPl Elph-Mcnc 0.2 (H) Not Observed g/dL   Globulin, Total 3.4 2.2 - 3.9 g/dL   Albumin/Glob SerPl 1.2 0.7 - 1.7   IFE 1 Comment    Please Note Comment       Assessment & Plan:   Problem List Items Addressed This Visit      Cardiovascular and Mediastinum   Essential hypertension - Primary    The current medical regimen is effective;  continue present plan and medications.       Relevant Orders   Bayer DCA Hb A1c Waived   LP+ALT+AST Piccolo, Waived   Basic metabolic panel     Endocrine   DM type 2 with diabetic peripheral neuropathy ([place])    Discussed diabetes have stopped Jardiance and low sugar spells of abated.  Patient will check her glucose during these spells to make sure she is having low sugar  and not just hungry. If needed may need to cut Januvia in half.      Relevant Medications   pregabalin (LYRICA) 150 MG capsule   Other Relevant Orders   Bayer DCA Hb A1c Waived   LP+ALT+AST Piccolo, Waived   Basic metabolic panel     Other   Hyperlipidemia    The current medical regimen is effective;  continue present plan and medications.       Relevant Orders   Bayer DCA Hb A1c Waived   LP+ALT+AST Piccolo, Waived   Basic metabolic panel   Back pain    Discussed back pain will give more tramadol      Relevant Medications   traMADol (ULTRAM) 50 MG tablet       Follow up plan: Return in about 3 months (around 06/20/2018) for Hemoglobin A1c.

## 2018-03-21 NOTE — Assessment & Plan Note (Signed)
Discussed back pain will give more tramadol

## 2018-03-22 ENCOUNTER — Encounter: Payer: Self-pay | Admitting: Family Medicine

## 2018-03-22 LAB — BASIC METABOLIC PANEL
BUN/Creatinine Ratio: 16 (ref 12–28)
BUN: 20 mg/dL (ref 8–27)
CO2: 24 mmol/L (ref 20–29)
Calcium: 9.3 mg/dL (ref 8.7–10.3)
Chloride: 102 mmol/L (ref 96–106)
Creatinine, Ser: 1.28 mg/dL — ABNORMAL HIGH (ref 0.57–1.00)
GFR, EST AFRICAN AMERICAN: 49 mL/min/{1.73_m2} — AB (ref >59)
GFR, EST NON AFRICAN AMERICAN: 43 mL/min/{1.73_m2} — AB (ref >59)
Glucose: 136 mg/dL — ABNORMAL HIGH (ref 65–99)
Potassium: 4.8 mmol/L (ref 3.5–5.2)
Sodium: 139 mmol/L (ref 134–144)

## 2018-04-25 ENCOUNTER — Ambulatory Visit: Payer: Medicare Other | Admitting: Family Medicine

## 2018-04-25 ENCOUNTER — Encounter: Payer: Self-pay | Admitting: Family Medicine

## 2018-04-25 VITALS — BP 181/81 | HR 68 | Temp 98.8°F | Ht 62.0 in | Wt 307.0 lb

## 2018-04-25 DIAGNOSIS — R221 Localized swelling, mass and lump, neck: Secondary | ICD-10-CM | POA: Diagnosis not present

## 2018-04-25 MED ORDER — DICLOFENAC SODIUM 1 % TD GEL: 2.0000 g | Freq: Four times a day (QID) | TRANSDERMAL | 0 refills | Status: DC

## 2018-04-25 NOTE — Progress Notes (Signed)
BP (!) 181/81 (BP Location: Left Arm, Patient Position: Sitting, Cuff Size: Normal)   Pulse 68   Temp 98.8 F (37.1 C)   Ht 5\' 2"  (1.575 m)   Wt (!) 307 lb (139.3 kg)   SpO2 97%   BMI 56.15 kg/m    Subjective:    Patient ID: Anna Juarez, female    DOB: 07/13/1950, 68 y.o.   MRN: [id number]  HPI: Anna Juarez is a 68 y.o. female  Chief Complaint  Patient presents with  . Mass    left collar bone, painful and sore   Here today with a painful, worrisome mass behind right collarbone that she states she noticed 06/2017. Feels like it's grown since December and she experiences shooting pains from the area at times. Also notes that dry textured solid foods (such as cornbread) seem to get stuck at times. Becoming frustrated because she's asked many providers about this issue the past year and nobody can see or feel it and testing has all been normal. Got a CT of the neck back in March 2019 which came back negative. A swallowing eval was ordered at this time but she never did one. Had a CT head done 11/2017 which was also normal. No fevers, sweats, weight loss, difficulty breathing, injury to area.   Relevant past medical, surgical, family and social history reviewed and updated as indicated. Interim medical history since our last visit reviewed. Allergies and medications reviewed and updated.  Review of Systems  Per HPI unless specifically indicated above     Objective:    BP (!) 181/81 (BP Location: Left Arm, Patient Position: Sitting, Cuff Size: Normal)   Pulse 68   Temp 98.8 F (37.1 C)   Ht 5\' 2"  (1.575 m)   Wt (!) 307 lb (139.3 kg)   SpO2 97%   BMI 56.15 kg/m   Wt Readings from Last 3 Encounters:  04/25/18 (!) 307 lb (139.3 kg)  03/21/18 (!) 301 lb (136.5 kg)  02/08/18 299 lb 13.2 oz (136 kg)    Physical Exam Vitals signs and nursing note reviewed.  Constitutional:      Appearance: Normal appearance. She is not ill-appearing.  HENT:     Head: Atraumatic.       Nose: Nose normal.     Mouth/Throat:     Pharynx: Oropharynx is clear. No posterior oropharyngeal erythema.  Eyes:     Extraocular Movements: Extraocular movements intact.     Conjunctiva/sclera: Conjunctivae normal.  Neck:     Musculoskeletal: Normal range of motion and neck supple.  Cardiovascular:     Rate and Rhythm: Normal rate and regular rhythm.     Heart sounds: Normal heart sounds.  Pulmonary:     Effort: Pulmonary effort is normal.     Breath sounds: Normal breath sounds.  Musculoskeletal: Normal range of motion.  Lymphadenopathy:     Cervical: No cervical adenopathy.  Skin:    General: Skin is warm and dry.     Comments: No palpable mass in area identified by pt (right sternoclavicular region) Pt reports ttp  Neurological:     Mental Status: She is alert and oriented to person, place, and time.  Psychiatric:        Mood and Affect: Mood normal.        Thought Content: Thought content normal.        Judgment: Judgment normal.     Results for orders placed or performed in visit on 03/21/18  Bayer DCA Hb A1c Waived  Result Value Ref Range   HB A1C (BAYER DCA - WAIVED) 6.8 <7.0 %  LP+ALT+AST Piccolo, Waived  Result Value Ref Range   ALT (SGPT) Piccolo, Waived 45 10 - 47 U/L   AST (SGOT) Piccolo, Waived 59 (H) 11 - 38 U/L   Cholesterol Piccolo, Waived 150 <200 mg/dL   HDL Chol Piccolo, Waived 58 >59 mg/dL   Triglycerides Piccolo,Waived 279 (H) <150 mg/dL   Chol/HDL Ratio Piccolo,Waive 2.6 mg/dL   LDL Chol Calc Piccolo Waived 35 <100 mg/dL   VLDL Chol Calc Piccolo,Waive 56 (H) <30 mg/dL  Basic metabolic panel  Result Value Ref Range   Glucose 136 (H) 65 - 99 mg/dL   BUN 20 8 - 27 mg/dL   Creatinine, Ser 1.28 (H) 0.57 - 1.00 mg/dL   GFR calc non Af Amer 43 (L) >59 mL/min/1.73   GFR calc Af Amer 49 (L) >59 mL/min/1.73   BUN/Creatinine Ratio 16 12 - 28   Sodium 139 134 - 144 mmol/L   Potassium 4.8 3.5 - 5.2 mmol/L   Chloride 102 96 - 106 mmol/L   CO2 24 20  - 29 mmol/L   Calcium 9.3 8.7 - 10.3 mg/dL      Assessment & Plan:   Problem List Items Addressed This Visit    None    Visit Diagnoses    Neck mass    -  Primary   Recommended she follow through with swallowing eval. Will order soft tissue u/s and have her see GI if still no cause identified to work up dysphagia to solids   Relevant Orders   US Soft Tissue Head/Neck       Follow up plan: Return if symptoms worsen or fail to improve.

## 2018-04-26 LAB — HM DIABETES EYE EXAM

## 2018-05-03 ENCOUNTER — Ambulatory Visit
Admission: RE | Admit: 2018-05-03 | Discharge: 2018-05-03 | Disposition: A | Discharge status: Home / Self Care | Payer: Medicare Other | Source: Ambulatory Visit | Attending: Family Medicine | Admitting: Family Medicine

## 2018-05-03 ENCOUNTER — Ambulatory Visit: Payer: Medicare Other

## 2018-05-03 DIAGNOSIS — R221 Localized swelling, mass and lump, neck: Secondary | ICD-10-CM | POA: Insufficient documentation

## 2018-07-07 ENCOUNTER — Other Ambulatory Visit: Payer: Self-pay

## 2018-07-07 ENCOUNTER — Encounter: Payer: Self-pay | Admitting: Family Medicine

## 2018-07-07 ENCOUNTER — Ambulatory Visit (INDEPENDENT_AMBULATORY_CARE_PROVIDER_SITE_OTHER): Payer: Medicare Other | Admitting: Family Medicine

## 2018-07-07 DIAGNOSIS — N183 Chronic kidney disease, stage 3 unspecified: Secondary | ICD-10-CM

## 2018-07-07 DIAGNOSIS — E785 Hyperlipidemia, unspecified: Secondary | ICD-10-CM

## 2018-07-07 DIAGNOSIS — E1142 Type 2 diabetes mellitus with diabetic polyneuropathy: Secondary | ICD-10-CM | POA: Diagnosis not present

## 2018-07-07 DIAGNOSIS — I10 Essential (primary) hypertension: Secondary | ICD-10-CM | POA: Diagnosis not present

## 2018-07-07 MED ORDER — PREGABALIN 150 MG PO CAPS: 150.0000 mg | ORAL_CAPSULE | Freq: Three times a day (TID) | ORAL | 1 refills | Status: DC

## 2018-07-07 NOTE — Assessment & Plan Note (Signed)
The current medical regimen is effective;  continue present plan and medications.  

## 2018-07-07 NOTE — Progress Notes (Addendum)
BP 120/62 Comment: Patient reported  Pulse (!) 53 Comment: Patient reported  Temp 97.8 F (36.6 C)    Subjective:    Patient ID: Anna Juarez, female    DOB: 12-28-1950, 68 y.o.   MRN: [id number]  HPI: Anna Juarez is a 68 y.o. female  Chief Complaint  Patient presents with  . Hyperlipidemia  . Hypertension  . Diabetes    BS: 160  Telemedicine using audio and telecommunications for a synchronous communication visit. Today's visit due to COVID-19 isolation precautions I connected with [name] and verified that I am speaking with the correct person using two identifiers.   I discussed the limitations, risks, security and privacy concerns of performing an evaluation and management service by telephone and the availability of in person appointments. I also discussed with the patient that there may be a patient responsible charge related to this service. The patient expressed understanding and agreed to proceed. The patient's location is home I am at home. Reviewed medications with patient doing well no low blood sugar spells or problems with diabetes. Blood pressure doing well without problems. Cholesterol also doing well without problems. Rare use of tramadol for pain continued use of Lyrica for legs. Good reports from nephrology. Relevant past medical, surgical, family and social history reviewed and updated as indicated. Interim medical history since our last visit reviewed. Allergies and medications reviewed and updated.  Review of Systems  Constitutional: Negative.   Respiratory: Negative.   Cardiovascular: Negative.     Per HPI unless specifically indicated above     Objective:    BP 120/62 Comment: Patient reported  Pulse (!) 53 Comment: Patient reported  Temp 97.8 F (36.6 C)   Wt Readings from Last 3 Encounters:  04/25/18 (!) 307 lb (139.3 kg)  03/21/18 (!) 301 lb (136.5 kg)  02/08/18 299 lb 13.2 oz (136 kg)    Physical Exam none Results for  orders placed or performed in visit on 04/27/18  HM DIABETES EYE EXAM  Result Value Ref Range   HM Diabetic Eye Exam No Retinopathy No Retinopathy      Assessment & Plan:   Problem List Items Addressed This Visit      Cardiovascular and Mediastinum   Essential hypertension    The current medical regimen is effective;  continue present plan and medications.         Nervous and Auditory   DM type 2 with diabetic peripheral neuropathy ([facility])    The current medical regimen is effective;  continue present plan and medications.       Relevant Medications   pregabalin (LYRICA) 150 MG capsule     Genitourinary   CKD (chronic kidney disease) stage 3, GFR 30-59 ml/min ([facility])    The current medical regimen is effective;  continue present plan and medications.         Other   Hyperlipidemia    The current medical regimen is effective;  continue present plan and medications.         I discussed the assessment and treatment plan with the patient. The patient was provided an opportunity to ask questions and all were answered. The patient agreed with the plan and demonstrated an understanding of the instructions.   The patient was advised to call back or seek an in-person evaluation if the symptoms worsen or if the condition fails to improve as anticipated.   I provided 21+ minutes of time during this encounter.  Follow up plan: Return  in about 6 months (around 01/06/2019) for Physical Exam, Hemoglobin A1c.

## 2018-07-19 ENCOUNTER — Ambulatory Visit: Payer: Medicare Other | Admitting: Internal Medicine

## 2018-08-16 ENCOUNTER — Inpatient Hospital Stay: Payer: Medicare Other | Attending: Hematology and Oncology

## 2018-08-16 ENCOUNTER — Other Ambulatory Visit: Payer: Self-pay

## 2018-08-16 DIAGNOSIS — D472 Monoclonal gammopathy: Secondary | ICD-10-CM | POA: Insufficient documentation

## 2018-08-16 DIAGNOSIS — N183 Chronic kidney disease, stage 3 (moderate): Secondary | ICD-10-CM | POA: Insufficient documentation

## 2018-08-16 DIAGNOSIS — Z79899 Other long term (current) drug therapy: Secondary | ICD-10-CM | POA: Diagnosis not present

## 2018-08-16 DIAGNOSIS — I10 Essential (primary) hypertension: Secondary | ICD-10-CM | POA: Diagnosis not present

## 2018-08-16 LAB — CBC WITH DIFFERENTIAL/PLATELET
Abs Immature Granulocytes: 0.04 10*3/uL (ref 0.00–0.07)
Basophils Absolute: 0 10*3/uL (ref 0.0–0.1)
Basophils Relative: 1 %
Eosinophils Absolute: 0.2 10*3/uL (ref 0.0–0.5)
Eosinophils Relative: 2 %
HCT: 40.8 % (ref 36.0–46.0)
Hemoglobin: 13.5 g/dL (ref 12.0–15.0)
Immature Granulocytes: 1 %
Lymphocytes Relative: 24 %
Lymphs Abs: 1.6 10*3/uL (ref 0.7–4.0)
MCH: 30.3 pg (ref 26.0–34.0)
MCHC: 33.1 g/dL (ref 30.0–36.0)
MCV: 91.5 fL (ref 80.0–100.0)
Monocytes Absolute: 0.6 10*3/uL (ref 0.1–1.0)
Monocytes Relative: 9 %
Neutro Abs: 4.3 10*3/uL (ref 1.7–7.7)
Neutrophils Relative %: 63 %
Platelets: 163 10*3/uL (ref 150–400)
RBC: 4.46 MIL/uL (ref 3.87–5.11)
RDW: 13 % (ref 11.5–15.5)
WBC: 6.8 10*3/uL (ref 4.0–10.5)
nRBC: 0 % (ref 0.0–0.2)

## 2018-08-16 LAB — COMPREHENSIVE METABOLIC PANEL
ALT: 28 U/L (ref 0–44)
AST: 35 U/L (ref 15–41)
Albumin: 4.2 g/dL (ref 3.5–5.0)
Alkaline Phosphatase: 57 U/L (ref 38–126)
Anion gap: 9 (ref 5–15)
BUN: 28 mg/dL — ABNORMAL HIGH (ref 8–23)
CO2: 25 mmol/L (ref 22–32)
Calcium: 9.3 mg/dL (ref 8.9–10.3)
Chloride: 101 mmol/L (ref 98–111)
Creatinine, Ser: 1.56 mg/dL — ABNORMAL HIGH (ref 0.44–1.00)
GFR calc Af Amer: 39 mL/min — ABNORMAL LOW (ref >60)
GFR calc non Af Amer: 33 mL/min — ABNORMAL LOW (ref >60)
Glucose, Bld: 140 mg/dL — ABNORMAL HIGH (ref 70–99)
Potassium: 4.5 mmol/L (ref 3.5–5.1)
Sodium: 135 mmol/L (ref 135–145)
Total Bilirubin: 0.5 mg/dL (ref 0.3–1.2)
Total Protein: 7.7 g/dL (ref 6.5–8.1)

## 2018-08-17 LAB — MULTIPLE MYELOMA PANEL, SERUM
Albumin SerPl Elph-Mcnc: 4 g/dL (ref 2.9–4.4)
Albumin/Glob SerPl: 1.2 (ref 0.7–1.7)
Alpha 1: 0.2 g/dL (ref 0.0–0.4)
Alpha2 Glob SerPl Elph-Mcnc: 0.9 g/dL (ref 0.4–1.0)
B-Globulin SerPl Elph-Mcnc: 1.5 g/dL — ABNORMAL HIGH (ref 0.7–1.3)
Gamma Glob SerPl Elph-Mcnc: 0.9 g/dL (ref 0.4–1.8)
Globulin, Total: 3.5 g/dL (ref 2.2–3.9)
IgA: 535 mg/dL — ABNORMAL HIGH (ref 87–352)
IgG (Immunoglobin G), Serum: 1088 mg/dL (ref 586–1602)
IgM (Immunoglobulin M), Srm: 72 mg/dL (ref 26–217)
M Protein SerPl Elph-Mcnc: 0.3 g/dL — ABNORMAL HIGH
Total Protein ELP: 7.5 g/dL (ref 6.0–8.5)

## 2018-08-17 LAB — KAPPA/LAMBDA LIGHT CHAINS
Kappa free light chain: 26.6 mg/L — ABNORMAL HIGH (ref 3.3–19.4)
Kappa, lambda light chain ratio: 0.63 (ref 0.26–1.65)
Lambda free light chains: 42.3 mg/L — ABNORMAL HIGH (ref 5.7–26.3)

## 2018-08-22 ENCOUNTER — Other Ambulatory Visit: Payer: Self-pay

## 2018-08-22 ENCOUNTER — Ambulatory Visit (INDEPENDENT_AMBULATORY_CARE_PROVIDER_SITE_OTHER): Payer: Medicare Other | Admitting: Family Medicine

## 2018-08-22 ENCOUNTER — Encounter: Payer: Self-pay | Admitting: Family Medicine

## 2018-08-22 DIAGNOSIS — R229 Localized swelling, mass and lump, unspecified: Secondary | ICD-10-CM

## 2018-08-22 DIAGNOSIS — IMO0002 Reserved for concepts with insufficient information to code with codable children: Secondary | ICD-10-CM

## 2018-08-22 NOTE — Progress Notes (Signed)
There were no vitals taken for this visit.   Subjective:    Patient ID: Anna Juarez, female    DOB: 06/24/1950, 68 y.o.   MRN: [id number]  HPI: Anna Juarez is a 68 y.o. female  Chief Complaint  Patient presents with  . Mass    right side of collar bone, sensitative to the touch pain with certain movement    . This visit was completed via telephone due to the restrictions of the COVID-19 pandemic. All issues as above were discussed and addressed but no physical exam was performed. If it was felt that the patient should be evaluated in the office, they were directed there. The patient verbally consented to this visit. Patient was unable to complete an audio/visual visit due to Technical difficulties,Lack of internet. Due to the catastrophic nature of the COVID-19 pandemic, this visit was done through audio contact only. . Location of the patient: home . Location of the provider: home . Those involved with this call:  . Provider: Merrie Roof, PA-C . CMA: Merilyn Baba, [place] . Front Desk/Registration: Jill Side  . Time spent on call: 20 minutes on the phone discussing health concerns. 5 minutes total spent in review of patient's record and preparation of their chart. I verified patient identity using two factors (patient name and date of birth). Patient consents verbally to being seen via telemedicine visit today.   Patient presenting today following up on continued and worsening pain in right lower neck/area above clavicle. Has been undergoing workup for an area of swelling that she's noticed here for about 18 months. So far has had negative CT head/neck, negative CT chest, and negative neck u/s soft tissue. States the area Is larger and now feels like a cocklebur is stuck there. Denies color change, difficulty breathing, injury to area. Does have some swallowing issues to solids for which swallow studies have been ordered but patient has not yet completed those. Unclear if  issues are related.   Relevant past medical, surgical, family and social history reviewed and updated as indicated. Interim medical history since our last visit reviewed. Allergies and medications reviewed and updated.  Review of Systems  Per HPI unless specifically indicated above     Objective:    There were no vitals taken for this visit.  Wt Readings from Last 3 Encounters:  04/25/18 (!) 307 lb (139.3 kg)  03/21/18 (!) 301 lb (136.5 kg)  02/08/18 299 lb 13.2 oz (136 kg)    Physical Exam  Unable to perform PE due to patient inability to complete video visit because of lack of equipment.   Results for orders placed or performed in visit on 08/16/18  Multiple Myeloma Panel (SPEP&IFE w/QIG)  Result Value Ref Range   IgG (Immunoglobin G), Serum 1,088 586 - 1,602 mg/dL   IgA 535 (H) 87 - 352 mg/dL   IgM (Immunoglobulin M), Srm 72 26 - 217 mg/dL   Total Protein ELP 7.5 6.0 - 8.5 g/dL   Albumin SerPl Elph-Mcnc 4.0 2.9 - 4.4 g/dL   Alpha 1 0.2 0.0 - 0.4 g/dL   Alpha2 Glob SerPl Elph-Mcnc 0.9 0.4 - 1.0 g/dL   B-Globulin SerPl Elph-Mcnc 1.5 (H) 0.7 - 1.3 g/dL   Gamma Glob SerPl Elph-Mcnc 0.9 0.4 - 1.8 g/dL   M Protein SerPl Elph-Mcnc 0.3 (H) Not Observed g/dL   Globulin, Total 3.5 2.2 - 3.9 g/dL   Albumin/Glob SerPl 1.2 0.7 - 1.7   IFE 1 Comment  Please Note Comment   Kappa/lambda light chains  Result Value Ref Range   Kappa free light chain 26.6 (H) 3.3 - 19.4 mg/L   Lamda free light chains 42.3 (H) 5.7 - 26.3 mg/L   Kappa, lamda light chain ratio 0.63 0.26 - 1.65  Comprehensive metabolic panel  Result Value Ref Range   Sodium 135 135 - 145 mmol/L   Potassium 4.5 3.5 - 5.1 mmol/L   Chloride 101 98 - 111 mmol/L   CO2 25 22 - 32 mmol/L   Glucose, Bld 140 (H) 70 - 99 mg/dL   BUN 28 (H) 8 - 23 mg/dL   Creatinine, Ser 1.56 (H) 0.44 - 1.00 mg/dL   Calcium 9.3 8.9 - 10.3 mg/dL   Total Protein 7.7 6.5 - 8.1 g/dL   Albumin 4.2 3.5 - 5.0 g/dL   AST 35 15 - 41 U/L   ALT 28 0  - 44 U/L   Alkaline Phosphatase 57 38 - 126 U/L   Total Bilirubin 0.5 0.3 - 1.2 mg/dL   GFR calc non Af Amer 33 (L) >60 mL/min   GFR calc Af Amer 39 (L) >60 mL/min   Anion gap 9 5 - 15  CBC with Differential/Platelet  Result Value Ref Range   WBC 6.8 4.0 - 10.5 K/uL   RBC 4.46 3.87 - 5.11 MIL/uL   Hemoglobin 13.5 12.0 - 15.0 g/dL   HCT 40.8 36.0 - 46.0 %   MCV 91.5 80.0 - 100.0 fL   MCH 30.3 26.0 - 34.0 pg   MCHC 33.1 30.0 - 36.0 g/dL   RDW 13.0 11.5 - 15.5 %   Platelets 163 150 - 400 K/uL   nRBC 0.0 0.0 - 0.2 %   Neutrophils Relative % 63 %   Neutro Abs 4.3 1.7 - 7.7 K/uL   Lymphocytes Relative 24 %   Lymphs Abs 1.6 0.7 - 4.0 K/uL   Monocytes Relative 9 %   Monocytes Absolute 0.6 0.1 - 1.0 K/uL   Eosinophils Relative 2 %   Eosinophils Absolute 0.2 0.0 - 0.5 K/uL   Basophils Relative 1 %   Basophils Absolute 0.0 0.0 - 0.1 K/uL   Immature Granulocytes 1 %   Abs Immature Granulocytes 0.04 0.00 - 0.07 K/uL      Assessment & Plan:   Problem List Items Addressed This Visit    None    Visit Diagnoses    Mass    -  Primary   Unclear etiology after multiple imaging studies. Will refer to gen surgery for further imaging/evaluation given worsening course   Relevant Orders   Ambulatory referral to General Surgery       Follow up plan: Return if symptoms worsen or fail to improve.

## 2018-08-23 ENCOUNTER — Ambulatory Visit: Payer: Medicare Other | Admitting: Hematology and Oncology

## 2018-08-23 ENCOUNTER — Ambulatory Visit: Payer: Medicare Other | Admitting: Internal Medicine

## 2018-08-31 ENCOUNTER — Other Ambulatory Visit: Payer: Self-pay

## 2018-08-31 ENCOUNTER — Encounter: Payer: Self-pay | Admitting: General Surgery

## 2018-08-31 ENCOUNTER — Ambulatory Visit (INDEPENDENT_AMBULATORY_CARE_PROVIDER_SITE_OTHER): Payer: Medicare Other | Admitting: General Surgery

## 2018-08-31 VITALS — BP 164/82 | HR 72 | Temp 97.7°F | Ht 60.0 in | Wt 303.0 lb

## 2018-08-31 DIAGNOSIS — R070 Pain in throat: Secondary | ICD-10-CM

## 2018-08-31 NOTE — Patient Instructions (Signed)
You are scheduled for a swallowing study at [facility] on  Monday July 13th. You will need to arrive at the [place] Entrance at 12:30 pm.

## 2018-08-31 NOTE — Progress Notes (Signed)
Patient ID: Anna Juarez, female   DOB: 07/19/1950, 68 y.o.   MRN: [id number]  Chief Complaint  Patient presents with  . Other    HPI Anna Juarez is a 68 y.o. female.   She has been referred for evaluation of a "mass" at the head of her right clavicle.  She states that she has had pain in this area since about August 2019.  She has undergone a number of imaging studies that have not revealed any obvious source of her pain.  Physical examination by several providers has also failed to identify any lesion of concern.  She has had a CT scan of the head and neck, a bone scan, and an ultrasound, none of which revealed any visible source of her pain.  She states that the pain is intermittent, but that it seems to be increasing in frequency.  She says it is a sharp pain that does not radiate anywhere.  Her primary care doctor has provided her with tramadol, which she takes infrequently. She describes dysphagia and points to her mid neck when asked where things seem to get hung up.  She apparently was supposed to have a fluoroscopic swallowing evaluation, but has not followed through with this order.  She has had 2 cervical spine fusions in the past.   Past Medical History:  Diagnosis Date  . Chronic kidney disease   . Diabetes mellitus without complication ([place])   . Extreme obesity   . Fatty liver   . Hyperlipidemia   . Hypertension   . Radiculopathy, cervical   . Renal insufficiency    pt states she has chronic kidney disease.     Past Surgical History:  Procedure Laterality Date  . ABDOMINAL HYSTERECTOMY    . JOINT REPLACEMENT    . SPINE SURGERY      Family History  Problem Relation Age of Onset  . Diabetes Mother   . Heart disease Mother   . Hypertension Mother   . Heart disease Father   . Cancer Brother   . Hypertension Brother   . Breast cancer Maternal Aunt   . Breast cancer Paternal Aunt     Social History Social History   Tobacco Use  . Smoking status: Never  Smoker  . Smokeless tobacco: Never Used  Substance Use Topics  . Alcohol use: No  . Drug use: No    Allergies  Allergen Reactions  . Actos [Pioglitazone]   . Metformin And Related Diarrhea  . Victoza [Liraglutide] Nausea And Vomiting    Current Outpatient Medications  Medication Sig Dispense Refill  . benazepril (LOTENSIN) 40 MG tablet Take 1 tablet (40 mg total) by mouth daily. 90 tablet 4  . colesevelam (WELCHOL) 625 MG tablet Take 3 tablets (1,875 mg total) by mouth 2 (two) times daily. 540 tablet 4  . lovastatin (MEVACOR) 40 MG tablet Take 1 tablet (40 mg total) by mouth at bedtime. 90 tablet 4  . metoprolol succinate (TOPROL-XL) 50 MG 24 hr tablet Take 1 tablet (50 mg total) by mouth daily. Take with or immediately following a meal. 90 tablet 4  . NEXIUM 40 MG capsule Take 1 capsule (40 mg total) by mouth daily. 90 capsule 4  . pregabalin (LYRICA) 150 MG capsule Take 1 capsule (150 mg total) by mouth 3 (three) times daily. 270 capsule 1  . sitaGLIPtin (JANUVIA) 100 MG tablet Take 1 tablet (100 mg total) by mouth daily. 90 tablet 4  . traMADol (ULTRAM) 50 MG tablet  Take 1 tablet (50 mg total) by mouth every 8 (eight) hours as needed. 30 tablet 3   No current facility-administered medications for this visit.     Review of Systems Review of Systems  Constitutional: Positive for fatigue.  Musculoskeletal: Positive for arthralgias, back pain, myalgias and neck pain.  All other systems reviewed and are negative.   Blood pressure (!) 164/82, pulse 72, temperature 97.7 F (36.5 C), temperature source Skin, height 5' (1.524 m), weight (!) 303 lb (137.4 kg), SpO2 96 %.  Physical Exam Physical Exam Vitals signs reviewed.  Constitutional:      General: She is not in acute distress.    Appearance: Normal appearance. She is obese.  HENT:     Head: Atraumatic.     Nose:     Comments: Covered with a mask due to COVID-19    Mouth/Throat:     Comments: Covered with a mask due to  COVID-19 Eyes:     General: No scleral icterus.       Right eye: No discharge.        Left eye: No discharge.  Neck:      Comments: There are 2 surgical scars in the lower right neck.  When she is asked to point to where she has the pain, she marks out the exact extent of the more inferior scar.  There is no mass in the area.  It is nontender to manipulation and palpation. Cardiovascular:     Rate and Rhythm: Normal rate.  Pulmonary:     Effort: Pulmonary effort is normal.  Abdominal:     Palpations: Abdomen is soft.     Comments: Obese  Genitourinary:    Comments: Deferred Musculoskeletal:     Right lower leg: Edema present.     Left lower leg: Edema present.  Lymphadenopathy:     Cervical: No cervical adenopathy.  Skin:    General: Skin is warm and dry.  Neurological:     General: No focal deficit present.     Mental Status: She is alert.  Psychiatric:        Mood and Affect: Mood normal.        Behavior: Behavior normal.     Data Reviewed I reviewed the imaging studies that have been performed to date.  These include a CT scan of the soft tissues of the neck performed in March 2019 (admittedly prior to when the patient states she initially experienced her discomfort) which was done for right-sided pain with swallowing.  There are no abnormalities appreciated could conceivably account for the patient's dysphagia nor her current complaint.  She had a bone survey for metastases in November 2019.  This was performed due to the findings of a monoclonal gammopathy of uncertain significance.  No lytic bone lesions were appreciated throughout the entire skeleton.  Most recently, she had an ultrasound of the soft tissues of the head and neck performed in January 2020.  Once again, no etiology for her sternoclavicular pain was identified.  Assessment This is a 68 year old woman who has been complaining of pain at the sternoclavicular junction on the right.  Multiple imaging studies and  physical examinations have not identified a structural cause for her pain.  It does correspond to the boundaries of her more inferior scar from surgery, however no thickened tissue or other palpable findings are identified on my exam today.  In fact, she was entirely pain-free despite deep pressure, manipulation, and attempts to elicit the pain.  I do not think she has any surgically treatable source of her pain.  Options for pain management are somewhat limited, as she has chronic renal insufficiency and cannot take nonsteroidal anti-inflammatories.  She is already taking pregabalin, which would have been my next choice, with the assumption that this may be some scar tissue or sequelae from her spine surgery.  Plan I discussed with Anna Juarez the fact that there is no surgical cause for her pain that can be identified on imaging or exam.  I do not have anything to offer her in this regard.  She does continue to complain of dysphagia and has not had the recommended fluoroscopic swallowing evaluation.  We will help her arrange this while she is here in our office today.  At this time, I have not made a scheduled follow-up visit for her.    Fredirick Maudlin 08/31/2018, 3:47 PM

## 2018-09-28 ENCOUNTER — Other Ambulatory Visit: Payer: Self-pay

## 2018-09-28 DIAGNOSIS — D472 Monoclonal gammopathy: Secondary | ICD-10-CM

## 2018-09-28 LAB — HEMOGLOBIN A1C: Hemoglobin A1C: 8

## 2018-10-05 ENCOUNTER — Other Ambulatory Visit: Payer: Self-pay

## 2018-10-06 ENCOUNTER — Other Ambulatory Visit: Payer: Self-pay | Admitting: Hematology and Oncology

## 2018-10-06 ENCOUNTER — Other Ambulatory Visit: Payer: Medicare Other

## 2018-10-06 ENCOUNTER — Ambulatory Visit: Payer: Medicare Other | Admitting: Hematology and Oncology

## 2018-10-06 DIAGNOSIS — D472 Monoclonal gammopathy: Secondary | ICD-10-CM

## 2018-10-13 ENCOUNTER — Encounter: Payer: Self-pay | Admitting: Family Medicine

## 2018-10-13 ENCOUNTER — Telehealth: Payer: Self-pay | Admitting: Family Medicine

## 2018-10-13 NOTE — Telephone Encounter (Signed)
Please let patient know that we got her blood work back and her A1c is worse at 8.0. Please schedule her appointment with Dr. Jeananne Rama ASAP to discuss options. Thanks!

## 2018-10-14 NOTE — Telephone Encounter (Signed)
Message relayed to patient. Verbalized understanding and denied questions. Appointment scheduled.

## 2018-10-17 ENCOUNTER — Ambulatory Visit
Admission: RE | Admit: 2018-10-17 | Discharge: 2018-10-17 | Disposition: A | Discharge status: Home / Self Care | Payer: Medicare Other | Source: Ambulatory Visit | Attending: General Surgery | Admitting: General Surgery

## 2018-10-17 ENCOUNTER — Other Ambulatory Visit: Payer: Self-pay

## 2018-10-17 DIAGNOSIS — R070 Pain in throat: Secondary | ICD-10-CM | POA: Diagnosis present

## 2018-10-17 DIAGNOSIS — R1312 Dysphagia, oropharyngeal phase: Secondary | ICD-10-CM | POA: Insufficient documentation

## 2018-10-17 NOTE — Therapy (Signed)
[facility], [place], [address] Phone: [phone]   Fax:     Modified Barium Swallow  Patient Details  Name: Evanie Buckle MRN: [id number] Date of Birth: 11/20/1950 No data recorded  Encounter Date: 10/17/2018  End of Session - 10/17/18 1358    Visit Number  1    Number of Visits  1    Date for SLP Re-Evaluation  10/17/18    SLP Start Time  1300    SLP Stop Time   1358    SLP Time Calculation (min)  58 min    Activity Tolerance  Patient tolerated treatment well       Past Medical History:  Diagnosis Date  . Chronic kidney disease   . Diabetes mellitus without complication ([place])   . Extreme obesity   . Fatty liver   . Hyperlipidemia   . Hypertension   . Radiculopathy, cervical   . Renal insufficiency    pt states she has chronic kidney disease.     Past Surgical History:  Procedure Laterality Date  . ABDOMINAL HYSTERECTOMY    . JOINT REPLACEMENT    . SPINE SURGERY      There were no vitals filed for this visit.    Subjective: Patient behavior: (alertness, ability to follow instructions, etc.):  The patient is alert, able to express her swallowing symptoms, and follow directions.  Chief complaint: Throat pain with painful swallowing   Objective:  Radiological Procedure: A videoflouroscopic evaluation of oral-preparatory, reflex initiation, and pharyngeal phases of the swallow was performed; as well as a screening of the upper esophageal phase.  I. POSTURE: Upright in MBS chair  II. VIEW: Lateral  III. COMPENSATORY STRATEGIES: N/A  IV. BOLUSES ADMINISTERED:   Thin Liquid: 1 small, 3 rapid consecutive   Nectar-thick Liquid: 1 large   Honey-thick Liquid: DNT   Puree: 2 teaspoon presentation   Mechanical Soft: 1/4 graham cracker in applesauce  V. RESULTS OF EVALUATION: A. ORAL PREPARATORY PHASE: (The lips, tongue, and velum are observed for strength and  coordination)       **Overall Severity Rating: within normal limits   B. SWALLOW INITIATION/REFLEX: (The reflex is normal if "triggered" by the time the bolus reached the base of the tongue)  **Overall Severity Rating: within normal limits  C. PHARYNGEAL PHASE: (Pharyngeal function is normal if the bolus shows rapid, smooth, and continuous transit through the pharynx and there is no pharyngeal residue after the swallow)  **Overall Severity Rating: Minimal; minimally reduced tongue base retraction with trace vallecular residue  D. LARYNGEAL PENETRATION: (Material entering into the laryngeal inlet/vestibule but not aspirated) NONE  E. ASPIRATION: NONE  F. ESOPHAGEAL PHASE: (Screening of the upper esophagus): No overt abnormality within the viewable cervical esophagus  ASSESSMENT: This 68 year old woman; with throat pain and painful swallowing; is presenting with minimal oropharyngeal dysphagia characterized by minimally reduced pharyngeal pressure generation and trace pharyngeal residue post swallow.  Oral control of the bolus including oral hold, rotary mastication, and anterior to posterior transfer is within functional limits. Timing of pharyngeal swallow initiation is within normal limits   Aspects of the pharyngeal stage of swallowing including tongue base retraction, hyolaryngeal excursion, epiglottic inversion, and duration/amplitude of UES opening are minimally reduced.  There is no observed penetration or tracheal aspiration.  The patient is not at significant risk for prandial aspiration.  The reported pain was not reproduced during this study.   The reduction in pharyngeal pressure  generation is consistent with effects of laryngopharyngeal reflux (inflammation, edema, and resultant decreased sensation of the larynx and pharynx).    PLAN/RECOMMENDATIONS:   A. Diet: Regular   B. Swallowing Precautions: No special precautions indicated   C. Recommended consultation to: Follow up with MDs  as recommended   D. Therapy recommendations: speech therapy is not indicated   E. Results and recommendations were discussed with the patient immediately following the study and the final report routed to the referring MD.   1. Dysphagia, oropharyngeal phase   2. Throat pain           Problem List Patient Active Problem List   Diagnosis Date Noted  . MGUS (monoclonal gammopathy of unknown significance) 02/08/2018  . CKD (chronic kidney disease) stage 3, GFR 30-59 ml/min (HCC) 01/04/2018  . Chest pain 05/26/2017  . Throat pain 05/26/2017  . Advanced care planning/counseling discussion 11/24/2016  . Back pain 07/22/2016  . BMI 50.0-59.9, adult ([place]) 10/02/2014  . DM type 2 with diabetic peripheral neuropathy ([place])   . Hyperlipidemia   . Essential hypertension    Leroy Sea, MS/CCC- SLP  Lou Miner 10/17/2018, 1:59 PM  [facility] [place], [place], [address] Phone: [phone]   Fax:     Name: Jaki Steptoe MRN: [id number] Date of Birth: May 30, 1950

## 2018-10-19 ENCOUNTER — Other Ambulatory Visit: Payer: Self-pay

## 2018-10-19 ENCOUNTER — Ambulatory Visit (INDEPENDENT_AMBULATORY_CARE_PROVIDER_SITE_OTHER): Payer: Medicare Other | Admitting: Family Medicine

## 2018-10-19 ENCOUNTER — Encounter: Payer: Self-pay | Admitting: Family Medicine

## 2018-10-19 DIAGNOSIS — N183 Chronic kidney disease, stage 3 unspecified: Secondary | ICD-10-CM

## 2018-10-19 DIAGNOSIS — E1142 Type 2 diabetes mellitus with diabetic polyneuropathy: Secondary | ICD-10-CM | POA: Diagnosis not present

## 2018-10-19 DIAGNOSIS — I10 Essential (primary) hypertension: Secondary | ICD-10-CM

## 2018-10-19 DIAGNOSIS — E785 Hyperlipidemia, unspecified: Secondary | ICD-10-CM

## 2018-10-19 NOTE — Assessment & Plan Note (Signed)
Followed by kidney doctor and stable

## 2018-10-19 NOTE — Assessment & Plan Note (Signed)
The current medical regimen is effective;  continue present plan and medications.  

## 2018-10-19 NOTE — Progress Notes (Signed)
   There were no vitals taken for this visit.   Subjective:    Patient ID: Anna Juarez, female    DOB: 1951/02/02, 68 y.o.   MRN: [id number]  HPI: Anna Juarez is a 68 y.o. female  Med check Patient follow-up all in all doing well no complaints reviewed blood sugar concerned about blood sugar being up with hemoglobin A1c of 8 Home glucose readings are doing good. Little further evaluation though patient does reveal dietary slackness with the nice dietary foods with fruits and vegetables now in the stores. Reviewed cholesterol and blood pressure doing okay.  Relevant past medical, surgical, family and social history reviewed and updated as indicated. Interim medical history since our last visit reviewed. Allergies and medications reviewed and updated.  Review of Systems  Constitutional: Negative.   Respiratory: Negative.   Cardiovascular: Negative.     Per HPI unless specifically indicated above     Objective:    There were no vitals taken for this visit.  Wt Readings from Last 3 Encounters:  08/31/18 (!) 303 lb (137.4 kg)  04/25/18 (!) 307 lb (139.3 kg)  03/21/18 (!) 301 lb (136.5 kg)    Physical Exam  Results for orders placed or performed in visit on 10/13/18  Hemoglobin A1c  Result Value Ref Range   Hemoglobin A1C 8.0       Assessment & Plan:   Problem List Items Addressed This Visit      Cardiovascular and Mediastinum   Essential hypertension    The current medical regimen is effective;  continue present plan and medications.         Nervous and Auditory   DM type 2 with diabetic peripheral neuropathy (HCC)    The current medical regimen is effective;  continue present plan and medications.         Genitourinary   CKD (chronic kidney disease) stage 3, GFR 30-59 ml/min (HCC)    Followed by kidney doctor and stable        Other   Hyperlipidemia    The current medical regimen is effective;  continue present plan and medications.           Telemedicine using audio/video telecommunications for a synchronous communication visit. Today's visit due to COVID-19 isolation precautions I connected with and verified that I am speaking with the correct person using two identifiers.   I discussed the limitations, risks, security and privacy concerns of performing an evaluation and management service by telecommunication and the availability of in person appointments. I also discussed with the patient that there may be a patient responsible charge related to this service. The patient expressed understanding and agreed to proceed. The patient's location is home. I am at home.   I discussed the assessment and treatment plan with the patient. The patient was provided an opportunity to ask questions and all were answered. The patient agreed with the plan and demonstrated an understanding of the instructions.   The patient was advised to call back or seek an in-person evaluation if the symptoms worsen or if the condition fails to improve as anticipated.   I provided 21+ minutes of time during this encounter. Follow up plan: Return in about 3 months (around 01/19/2019) for Physical Exam, Hemoglobin A1c.

## 2019-01-16 ENCOUNTER — Telehealth: Payer: Self-pay | Admitting: Family Medicine

## 2019-01-16 NOTE — Telephone Encounter (Signed)
Pt.notified

## 2019-01-16 NOTE — Telephone Encounter (Signed)
OK  Copied from [place] [id number]. Topic: General - Inquiry >> Jan 16, 2019 10:34 AM Mathis Bud wrote: Reason for CRM: Patient is request a TOC from [place] to [facility]. Patient has seen PA in the past and wants to know if she will take her on as a patient.  Call back [phone]

## 2019-02-04 ENCOUNTER — Other Ambulatory Visit: Payer: Self-pay | Admitting: Family Medicine

## 2019-02-04 DIAGNOSIS — I10 Essential (primary) hypertension: Secondary | ICD-10-CM

## 2019-02-04 NOTE — Telephone Encounter (Signed)
Forwarding medication refill requests to PCP for review. 

## 2019-02-08 ENCOUNTER — Encounter: Payer: Self-pay | Admitting: Family Medicine

## 2019-02-08 ENCOUNTER — Ambulatory Visit (INDEPENDENT_AMBULATORY_CARE_PROVIDER_SITE_OTHER): Payer: Medicare Other | Admitting: Family Medicine

## 2019-02-08 ENCOUNTER — Other Ambulatory Visit: Payer: Self-pay

## 2019-02-08 VITALS — BP 123/63 | HR 65 | Temp 98.4°F | Ht 62.0 in | Wt 308.0 lb

## 2019-02-08 DIAGNOSIS — E785 Hyperlipidemia, unspecified: Secondary | ICD-10-CM

## 2019-02-08 DIAGNOSIS — E1142 Type 2 diabetes mellitus with diabetic polyneuropathy: Secondary | ICD-10-CM

## 2019-02-08 DIAGNOSIS — G8929 Other chronic pain: Secondary | ICD-10-CM

## 2019-02-08 DIAGNOSIS — M545 Low back pain, unspecified: Secondary | ICD-10-CM

## 2019-02-08 DIAGNOSIS — N183 Chronic kidney disease, stage 3 unspecified: Secondary | ICD-10-CM | POA: Diagnosis not present

## 2019-02-08 DIAGNOSIS — E1169 Type 2 diabetes mellitus with other specified complication: Secondary | ICD-10-CM | POA: Diagnosis not present

## 2019-02-08 DIAGNOSIS — E1159 Type 2 diabetes mellitus with other circulatory complications: Secondary | ICD-10-CM

## 2019-02-08 DIAGNOSIS — I10 Essential (primary) hypertension: Secondary | ICD-10-CM

## 2019-02-08 NOTE — Progress Notes (Signed)
BP 123/63   Pulse 65   Temp 98.4 F (36.9 C) (Oral)   Ht 5\' 2"  (1.575 m)   Wt (!) 308 lb (139.7 kg)   SpO2 97%   BMI 56.33 kg/m    Subjective:    Patient ID: Anna Juarez, female    DOB: [birth year], 68 y.o.   MRN: [id number]  HPI: Anna Juarez is a 68 y.o. female  Chief Complaint  Patient presents with  . Diabetes  . Hyperlipidemia  . Hypertension   Here today for chronic condition f/u.   Taking prn tramadol for her chronic low back pain, typically once every few weeks on really bad days. Also takes lyrica as well for this and neuropathy. Always takes tylenol prior to reaching for a tramadol. One 30 day script tends to last about 3 months. States physically care giving for her husband is typically the trigger that sets off a pain flare. Denies radicular sxs, incontinence, falls.   HTN - not checking home BPs regularly, but WNL when checked around 120s/70s. Taking medicines faithfully without side effects. Denies CP, SOB, HAs, dizziness.   DM - not watching diet or exercising regularly, and not checking home BSs. Taking [name] without side effects or low blood sugar spells.   HLD - Tolerating medicines well. No myalgias, claudication. Taking lovastatin and welchol.   Relevant past medical, surgical, family and social history reviewed and updated as indicated. Interim medical history since our last visit reviewed. Allergies and medications reviewed and updated.  Review of Systems  Per HPI unless specifically indicated above     Objective:    BP 123/63   Pulse 65   Temp 98.4 F (36.9 C) (Oral)   Ht 5\' 2"  (1.575 m)   Wt (!) 308 lb (139.7 kg)   SpO2 97%   BMI 56.33 kg/m   Wt Readings from Last 3 Encounters:  02/08/19 (!) 308 lb (139.7 kg)  08/31/18 (!) 303 lb (137.4 kg)  04/25/18 (!) 307 lb (139.3 kg)    Physical Exam Vitals signs and nursing note reviewed.  Constitutional:      Appearance: Normal appearance. She is not ill-appearing.  HENT:   Head: Atraumatic.  Eyes:     Extraocular Movements: Extraocular movements intact.     Conjunctiva/sclera: Conjunctivae normal.  Neck:     Musculoskeletal: Normal range of motion and neck supple.  Cardiovascular:     Rate and Rhythm: Normal rate and regular rhythm.     Heart sounds: Normal heart sounds.  Pulmonary:     Effort: Pulmonary effort is normal.     Breath sounds: Normal breath sounds.  Musculoskeletal: Normal range of motion.  Skin:    General: Skin is warm and dry.  Neurological:     Mental Status: She is alert and oriented to person, place, and time.  Psychiatric:        Mood and Affect: Mood normal.        Thought Content: Thought content normal.        Judgment: Judgment normal.     Results for orders placed or performed in visit on 02/08/19  Comprehensive metabolic panel  Result Value Ref Range   Glucose 163 (H) 65 - 99 mg/dL   BUN 20 8 - 27 mg/dL   Creatinine, Ser 1.43 (H) 0.57 - 1.00 mg/dL   GFR calc non Af Amer 37 (L) >59 mL/min/1.73   GFR calc Af Amer 43 (L) >59 mL/min/1.73   BUN/Creatinine Ratio 14  12 - 28   Sodium 139 134 - 144 mmol/L   Potassium 4.5 3.5 - 5.2 mmol/L   Chloride 100 96 - 106 mmol/L   CO2 25 20 - 29 mmol/L   Calcium 9.4 8.7 - 10.3 mg/dL   Total Protein 7.3 6.0 - 8.5 g/dL   Albumin 4.4 3.8 - 4.8 g/dL   Globulin, Total 2.9 1.5 - 4.5 g/dL   Albumin/Globulin Ratio 1.5 1.2 - 2.2   Bilirubin Total 0.5 0.0 - 1.2 mg/dL   Alkaline Phosphatase 61 39 - 117 IU/L   AST 58 (H) 0 - 40 IU/L   ALT 36 (H) 0 - 32 IU/L  Lipid Panel w/o Chol/HDL Ratio  Result Value Ref Range   Cholesterol, Total 157 100 - 199 mg/dL   Triglycerides 146 0 - 149 mg/dL   HDL 57 >39 mg/dL   VLDL Cholesterol Cal 25 5 - 40 mg/dL   LDL Chol Calc (NIH) 75 0 - 99 mg/dL  HgB A1c  Result Value Ref Range   Hgb A1c MFr Bld 7.5 (H) 4.8 - 5.6 %   Est. average glucose Bld gHb Est-mCnc 169 mg/dL      Assessment & Plan:   Problem List Items Addressed This Visit       Cardiovascular and Mediastinum   Hypertension associated with diabetes ([place]) - Primary    BPs stable and WNL, continue current regimen      Relevant Orders   Comprehensive metabolic panel (Completed)     Endocrine   DM type 2 with diabetic peripheral neuropathy (HCC)    Recheck A1C, adjust as needed. Reviewed importance of good lifestyle habits to maintain BS control.       Relevant Orders   HgB A1c (Completed)   Hyperlipidemia associated with type 2 diabetes mellitus (HCC)    Recheck lipids, adjust as needed. Continue current regimen. Diet and exercise modifications reviewed.       Relevant Orders   Lipid Panel w/o Chol/HDL Ratio (Completed)     Genitourinary   CKD (chronic kidney disease) stage 3, GFR 30-59 ml/min    Recheck labs, stay hydrated, avoid nephrotoxic agents        Other   Back pain    On lyrica, tylenol and prn tramadol with good relief. Continue current regimen with rare use of tramadol with goal of 1 script for #30 lasting at least 3 months          Follow up plan: Return in about 3 months (around 05/11/2019) for CPE, AwV.

## 2019-02-09 LAB — COMPREHENSIVE METABOLIC PANEL
ALT: 36 IU/L — ABNORMAL HIGH (ref 0–32)
AST: 58 IU/L — ABNORMAL HIGH (ref 0–40)
Albumin/Globulin Ratio: 1.5 (ref 1.2–2.2)
Albumin: 4.4 g/dL (ref 3.8–4.8)
Alkaline Phosphatase: 61 IU/L (ref 39–117)
BUN/Creatinine Ratio: 14 (ref 12–28)
BUN: 20 mg/dL (ref 8–27)
Bilirubin Total: 0.5 mg/dL (ref 0.0–1.2)
CO2: 25 mmol/L (ref 20–29)
Calcium: 9.4 mg/dL (ref 8.7–10.3)
Chloride: 100 mmol/L (ref 96–106)
Creatinine, Ser: 1.43 mg/dL — ABNORMAL HIGH (ref 0.57–1.00)
GFR calc Af Amer: 43 mL/min/{1.73_m2} — ABNORMAL LOW (ref >59)
GFR calc non Af Amer: 37 mL/min/{1.73_m2} — ABNORMAL LOW (ref >59)
Globulin, Total: 2.9 g/dL (ref 1.5–4.5)
Glucose: 163 mg/dL — ABNORMAL HIGH (ref 65–99)
Potassium: 4.5 mmol/L (ref 3.5–5.2)
Sodium: 139 mmol/L (ref 134–144)
Total Protein: 7.3 g/dL (ref 6.0–8.5)

## 2019-02-09 LAB — LIPID PANEL W/O CHOL/HDL RATIO
Cholesterol, Total: 157 mg/dL (ref 100–199)
HDL: 57 mg/dL (ref >39)
LDL Chol Calc (NIH): 75 mg/dL (ref 0–99)
Triglycerides: 146 mg/dL (ref 0–149)
VLDL Cholesterol Cal: 25 mg/dL (ref 5–40)

## 2019-02-09 LAB — HEMOGLOBIN A1C
Est. average glucose Bld gHb Est-mCnc: 169 mg/dL
Hgb A1c MFr Bld: 7.5 % — ABNORMAL HIGH (ref 4.8–5.6)

## 2019-02-10 ENCOUNTER — Other Ambulatory Visit: Payer: Self-pay | Admitting: Family Medicine

## 2019-02-10 MED ORDER — GLIPIZIDE 5 MG PO TABS: 5.0000 mg | ORAL_TABLET | Freq: Two times a day (BID) | ORAL | 2 refills | Status: DC

## 2019-02-13 ENCOUNTER — Other Ambulatory Visit: Payer: Self-pay

## 2019-02-13 DIAGNOSIS — G8929 Other chronic pain: Secondary | ICD-10-CM

## 2019-02-15 MED ORDER — TRAMADOL HCL 50 MG PO TABS: 50.0000 mg | ORAL_TABLET | Freq: Three times a day (TID) | ORAL | 1 refills | Status: DC | PRN

## 2019-02-15 NOTE — Assessment & Plan Note (Signed)
Recheck lipids, adjust as needed. Continue current regimen. Diet and exercise modifications reviewed.

## 2019-02-15 NOTE — Assessment & Plan Note (Signed)
BPs stable and WNL, continue current regimen 

## 2019-02-15 NOTE — Assessment & Plan Note (Addendum)
On lyrica, tylenol and prn tramadol with good relief. Continue current regimen with rare use of tramadol with goal of 1 script for #30 lasting at least 3 months

## 2019-02-15 NOTE — Assessment & Plan Note (Signed)
Recheck labs, stay hydrated, avoid nephrotoxic agents

## 2019-02-15 NOTE — Assessment & Plan Note (Signed)
Recheck A1C, adjust as needed. Reviewed importance of good lifestyle habits to maintain BS control.

## 2019-03-13 ENCOUNTER — Other Ambulatory Visit: Payer: Self-pay | Admitting: Family Medicine

## 2019-03-13 DIAGNOSIS — E1142 Type 2 diabetes mellitus with diabetic polyneuropathy: Secondary | ICD-10-CM

## 2019-03-13 NOTE — Telephone Encounter (Signed)
Requested medication (s) are due for refill today: yes  Requested medication (s) are on the active medication list: yes  Last refill:  12/11/2018  Future visit scheduled: yes  Notes to clinic:  Refill cannot be delegated   Requested Prescriptions  Pending Prescriptions Disp Refills   pregabalin (LYRICA) 150 MG capsule [Pharmacy Med Name: PREGABALIN 150 MG CAPSULE] 270 capsule     Sig: Take 1 capsule (150 mg total) by mouth 3 (three) times daily.     Not Delegated - Neurology:  Anticonvulsants - Controlled Failed - 03/13/2019  8:48 AM      Failed - This refill cannot be delegated      Passed - Valid encounter within last 12 months    Recent Outpatient Visits          1 month ago Hypertension associated with diabetes [facility])   [facility] Volney American, [place]   4 months ago Essential hypertension   [place] Guadalupe Maple, [place]   6 months ago [place], [place], [place]   8 months ago DM type 2 with diabetic peripheral neuropathy [facility])   [place], Jeannette How, MD   10 months ago Neck mass   [facility], Lilia Argue, [place]      Future Appointments            In 2 months [facility] Desanct[facility], Lilia Argue, [place], [place]

## 2019-03-21 ENCOUNTER — Other Ambulatory Visit: Payer: Self-pay

## 2019-03-21 DIAGNOSIS — I10 Essential (primary) hypertension: Secondary | ICD-10-CM

## 2019-03-21 NOTE — Telephone Encounter (Signed)
Patient last seen 02/08/19

## 2019-03-22 MED ORDER — BENAZEPRIL HCL 40 MG PO TABS: 40.0000 mg | ORAL_TABLET | Freq: Every day | ORAL | 1 refills | Status: DC

## 2019-03-26 ENCOUNTER — Other Ambulatory Visit: Payer: Self-pay | Admitting: Family Medicine

## 2019-03-27 ENCOUNTER — Other Ambulatory Visit: Payer: Self-pay

## 2019-03-27 DIAGNOSIS — E785 Hyperlipidemia, unspecified: Secondary | ICD-10-CM

## 2019-03-27 DIAGNOSIS — E1142 Type 2 diabetes mellitus with diabetic polyneuropathy: Secondary | ICD-10-CM

## 2019-03-27 MED ORDER — SITAGLIPTIN PHOSPHATE 100 MG PO TABS: 100.0000 mg | ORAL_TABLET | Freq: Every day | ORAL | 1 refills | Status: DC

## 2019-03-27 MED ORDER — LOVASTATIN 40 MG PO TABS: 40.0000 mg | ORAL_TABLET | Freq: Every day | ORAL | 1 refills | Status: DC

## 2019-03-27 NOTE — Telephone Encounter (Signed)
Patient last seen 02/08/19

## 2019-04-28 LAB — HM DIABETES EYE EXAM

## 2019-05-07 ENCOUNTER — Other Ambulatory Visit: Payer: Self-pay | Admitting: Family Medicine

## 2019-05-11 ENCOUNTER — Ambulatory Visit (INDEPENDENT_AMBULATORY_CARE_PROVIDER_SITE_OTHER): Payer: Medicare PPO

## 2019-05-11 DIAGNOSIS — Z0000 Encounter for general adult medical examination without abnormal findings: Secondary | ICD-10-CM | POA: Diagnosis not present

## 2019-05-11 NOTE — Progress Notes (Signed)
Subjective:   Anna Juarez is a 69 y.o. female who presents for Medicare Annual/Subsequent preventive examination.  This visit is being conducted via phone call  - after an attmept to do on video chat - due to the COVID-19 pandemic. This patient has given me verbal consent via phone to conduct this visit, patient states they are participating from their home address. Some vital signs may be absent or patient reported.   Patient identification: identified by name, DOB, and current address.     Review of Systems:   Cardiac Risk Factors include: advanced age (>37men, >86 women);dyslipidemia;diabetes mellitus;hypertension     Objective:    Vitals: There were no vitals taken for this visit.  There is no height or weight on file to calculate BMI.  Advanced Directives 05/11/2019  Does Patient Have a Medical Advance Directive? Yes  Type of Advance Directive Living will;Healthcare Power of [place] in Chart? No - copy requested    Tobacco Social History   Tobacco Use  Smoking Status Never Smoker  Smokeless Tobacco Never Used     Counseling given: Not Answered   Clinical Intake:  Pre-visit preparation completed: Yes  Pain : No/denies pain     Nutritional Status: BMI > 30  Obese Nutritional Risks: None Diabetes: Yes CBG done?: No Did pt. bring in CBG monitor from home?: No  How often do you need to have someone help you when you read instructions, pamphlets, or other written materials from your doctor or pharmacy?: 1 - Never  Nutrition Risk Assessment:  Has the patient had any N/V/D within the last 2 months?  No  Does the patient have any non-healing wounds?  No  Has the patient had any unintentional weight loss or weight gain?  No   Diabetes:  Is the patient diabetic?  Yes  If diabetic, was a CBG obtained today?  No  Did the patient bring in their glucometer from home?  No  How often do you monitor your CBG's? Sometimes     Financial Strains and Diabetes Management:  Are you having any financial strains with the device, your supplies or your medication? No .  Does the patient want to be seen by Chronic Care Management for management of their diabetes?  No  Would the patient like to be referred to a Nutritionist or for Diabetic Management?  No    Already in contact with CCM team.   Diabetic Exams:  Diabetic Eye Exam: Completed 04/28/2019.   Diabetic Foot Exam: Pt has been advised about the importance in completing this exam.  Interpreter Needed?: No  Information entered by :: Zhanae Proffit,LPN  Past Medical History:  Diagnosis Date  . Chronic kidney disease   . Diabetes mellitus without complication ([place])   . Extreme obesity   . Fatty liver   . Hyperlipidemia   . Hypertension   . Radiculopathy, cervical   . Renal insufficiency    pt states she has chronic kidney disease.    Past Surgical History:  Procedure Laterality Date  . ABDOMINAL HYSTERECTOMY    . JOINT REPLACEMENT    . SPINE SURGERY     Family History  Problem Relation Age of Onset  . Diabetes Mother   . Heart disease Mother   . Hypertension Mother   . Heart disease Father   . Cancer Brother   . Hypertension Brother   . Breast cancer Maternal Aunt   . Breast cancer Paternal Aunt  Social History   Socioeconomic History  . Marital status: Married    Spouse name: Not on file  . Number of children: Not on file  . Years of education: Not on file  . Highest education level: 12th grade  Occupational History  . Occupation: retired   Tobacco Use  . Smoking status: Never Smoker  . Smokeless tobacco: Never Used  Substance and Sexual Activity  . Alcohol use: No  . Drug use: No  . Sexual activity: Not on file  Other Topics Concern  . Not on file  Social History Narrative   [place]; lives with husband; never smoker; no alcohol; retd. Manufacturing.    Social Determinants of Health   Financial Resource  Strain:   . Difficulty of Paying Living Expenses: Not on file  Food Insecurity:   . Worried About Charity fundraiser in the Last Year: Not on file  . Ran Out of Food in the Last Year: Not on file  Transportation Needs:   . Lack of Transportation (Medical): Not on file  . Lack of Transportation (Non-Medical): Not on file  Physical Activity:   . Days of Exercise per Week: Not on file  . Minutes of Exercise per Session: Not on file  Stress:   . Feeling of Stress : Not on file  Social Connections:   . Frequency of Communication with Friends and Family: Not on file  . Frequency of Social Gatherings with Friends and Family: Not on file  . Attends Religious Services: Not on file  . Active Member of Clubs or Organizations: Not on file  . Attends Archivist Meetings: Not on file  . Marital Status: Not on file    Outpatient Encounter Medications as of 05/11/2019  Medication Sig  . benazepril (LOTENSIN) 40 MG tablet Take 1 tablet (40 mg total) by mouth daily.  Marland Kitchen glipiZIDE (GLUCOTROL) 5 MG tablet TAKE 1 TABLET (5 MG TOTAL) BY MOUTH 2 (TWO) TIMES DAILY BEFORE A MEAL.  Marland Kitchen lovastatin (MEVACOR) 40 MG tablet Take 1 tablet (40 mg total) by mouth at bedtime.  . metoprolol succinate (TOPROL-XL) 50 MG 24 hr tablet TAKE 1 TABLET (50 MG TOTAL) BY MOUTH DAILY. TAKE WITH OR IMMEDIATELY FOLLOWING A MEAL.  . pregabalin (LYRICA) 150 MG capsule TAKE 1 CAPSULE (150 MG TOTAL) BY MOUTH 3 (THREE) TIMES DAILY.  . sitaGLIPtin (JANUVIA) 100 MG tablet Take 1 tablet (100 mg total) by mouth daily.  . traMADol (ULTRAM) 50 MG tablet Take 1 tablet (50 mg total) by mouth every 8 (eight) hours as needed.  . colesevelam (WELCHOL) 625 MG tablet Take 3 tablets (1,875 mg total) by mouth 2 (two) times daily.  Marland Kitchen NEXIUM 40 MG capsule TAKE 1 CAPSULE BY MOUTH EVERY DAY (Patient not taking: Reported on 05/11/2019)   No facility-administered encounter medications on file as of 05/11/2019.    Activities of Daily Living In your  present state of health, do you have any difficulty performing the following activities: 05/11/2019  Hearing? N  Comment no hearing aids  Vision? N  Comment eyeglasses, [facility]  Difficulty concentrating or making decisions? N  Walking or climbing stairs? Y  Comment knee  Dressing or bathing? N  Doing errands, shopping? N  Preparing Food and eating ? N  Using the Toilet? N  In the past six months, have you accidently leaked urine? N  Do you have problems with loss of bowel control? N  Managing your Medications? N  Managing your Finances? N  Housekeeping or managing your Housekeeping? N  Comment sister helps  Some recent data might be hidden    Patient Care Team: Volney American, PA-C as PCP - General (Family Medicine) Lucilla Lame, MD as Consulting Physician (Gastroenterology) [name], MD as Consulting Physician (Orthopedic Surgery)   Assessment:   This is a routine wellness examination for Kadisha.  Exercise Activities and Dietary recommendations Current Exercise Habits: The patient does not participate in regular exercise at present, Exercise limited by: None identified  Goals    . DIET - INCREASE WATER INTAKE     Recommend drinking at least 6-8 glasses of water a day        Fall Risk Fall Risk  05/11/2019 03/21/2018 01/03/2018 05/26/2017 04/08/2017  Falls in the past year? 0 0 No No No  Number falls in past yr: 0 - - - -  Injury with Fall? 0 - - - -  Follow up - Falls evaluation completed - - -   FALL RISK PREVENTION PERTAINING TO THE HOME:  Any stairs in or around the home? No  If so, are there any without handrails? No   Home free of loose throw rugs in walkways, pet beds, electrical cords, etc? Yes  Adequate lighting in your home to reduce risk of falls? Yes   ASSISTIVE DEVICES UTILIZED TO PREVENT FALLS:  Life alert? No  Use of a cane, walker or w/c? No  Grab bars in the bathroom? Yes  Shower chair or bench in shower? Yes  Elevated  toilet seat or a handicapped toilet? Yes    TIMED UP AND GO:  Unable to perform    Depression Screen PHQ 2/9 Scores 05/11/2019 01/03/2018 05/26/2017 11/24/2016  PHQ - 2 Score 0 0 0 0    Cognitive Function     6CIT Screen 01/03/2018  What Year? 0 points  What month? 0 points  What time? 0 points  Count back from 20 0 points  Months in reverse 0 points  Repeat phrase 0 points  Total Score 0    Immunization History  Administered Date(s) Administered  . Influenza, High Dose Seasonal PF 01/07/2016, 01/19/2017, 01/03/2018, 01/13/2019  . Influenza,inj,Quad PF,6+ Mos 01/02/2015  . Influenza-Unspecified 02/28/2014, 01/19/2017  . Moderna SARS-COVID-2 Vaccination 04/21/2019  . Pneumococcal Conjugate-13 02/28/2014  . Pneumococcal Polysaccharide-23 10/31/2015  . Pneumococcal-Unspecified 11/14/2004  . Tdap 09/13/2013    Qualifies for Shingles Vaccine? Yes  Zostavax completed n/a. Due for Shingrix. Education has been provided regarding the importance of this vaccine. Pt has been advised to call insurance company to determine out of pocket expense. Advised may also receive vaccine at local pharmacy or Health Dept. Verbalized acceptance and understanding.  Tdap: up to date   Flu Vaccine: up to date   Pneumococcal Vaccine: up to date   Screening Tests Health Maintenance  Topic Date Due  . FOOT EXAM  01/04/2019  . COLONOSCOPY  06/13/2019  . HEMOGLOBIN A1C  08/08/2019  . MAMMOGRAM  01/25/2020  . OPHTHALMOLOGY EXAM  04/27/2020  . TETANUS/TDAP  09/14/2023  . INFLUENZA VACCINE  Completed  . DEXA SCAN  Completed  . Hepatitis C Screening  Completed  . PNA vac Low Risk Adult  Completed   Cancer Screenings:  Colorectal Screening: Completed 06/12/2009. Repeat every 10 years  Lung Cancer Screening: (Low Dose CT Chest recommended if Age 72-80 years, 30 pack-year currently smoking OR have quit w/in 15years.) does not qualify.    Mammogram: 01/24/2018  Additional Screening:  Hepatitis C  Screening: does qualify; Completed 2017  Dental Screening: Recommended annual dental exams for proper oral hygiene  Community Resource Referral:  CRR required this visit?  No        Plan:  I have personally reviewed and addressed the Medicare Annual Wellness questionnaire and have noted the following in the patient's chart:  A. Medical and social history B. Use of alcohol, tobacco or illicit drugs  C. Current medications and supplements D. Functional ability and status E.  Nutritional status F.  Physical activity G. Advance directives H. List of other physicians I.  Hospitalizations, surgeries, and ER visits in previous 12 months J.  [place] such as hearing and vision if needed, cognitive and depression L. Referrals and appointments   In addition, I have reviewed and discussed with patient certain preventive protocols, quality metrics, and best practice recommendations. A written personalized care plan for preventive services as well as general preventive health recommendations were provided to patient.   Signed,   Bevelyn Ngo, LPN  [id number] Nurse Health Advisor  Nurse Notes: none

## 2019-05-11 NOTE — Patient Instructions (Signed)
Anna Juarez , Thank you for taking time to come for your Medicare Wellness Visit. I appreciate your ongoing commitment to your health goals. Please review the following plan we discussed and let me know if I can assist you in the future.   Screening recommendations/referrals: Colonoscopy: completed 06/12/2009 Mammogram: completed 01/24/2018 Bone Density: up to date  Recommended yearly ophthalmology/optometry visit for glaucoma screening and checkup Recommended yearly dental visit for hygiene and checkup  Vaccinations: Influenza vaccine: up to date  Pneumococcal vaccine: up to date Tdap vaccine: up to date  Shingles vaccine: shingrix eligible     Advanced directives: Please bring a copy of your health care power of attorney and living will to the office at your convenience.  Conditions/risks identified: diabetic, already in contact with ccm team   Next appointment: follow up in one year for your annual wellness visit    Preventive Care 65 Years and Older, Female Preventive care refers to lifestyle choices and visits with your health care provider that can promote health and wellness. What does preventive care include?  A yearly physical exam. This is also called an annual well check.  Dental exams once or twice a year.  Routine eye exams. Ask your health care provider how often you should have your eyes checked.  Personal lifestyle choices, including:  Daily care of your teeth and gums.  Regular physical activity.  Eating a healthy diet.  Avoiding tobacco and drug use.  Limiting alcohol use.  Practicing safe sex.  Taking low-dose aspirin every day.  Taking vitamin and mineral supplements as recommended by your health care provider. What happens during an annual well check? The services and screenings done by your health care provider during your annual well check will depend on your age, overall health, lifestyle risk factors, and family history of disease. Counseling    Your health care provider may ask you questions about your:  Alcohol use.  Tobacco use.  Drug use.  Emotional well-being.  Home and relationship well-being.  Sexual activity.  Eating habits.  History of falls.  Memory and ability to understand (cognition).  Work and work Statistician.  Reproductive health. Screening  You may have the following tests or measurements:  Height, weight, and BMI.  Blood pressure.  Lipid and cholesterol levels. These may be checked every 5 years, or more frequently if you are over 33 years old.  Skin check.  Lung cancer screening. You may have this screening every year starting at age 3 if you have a 30-pack-year history of smoking and currently smoke or have quit within the past 15 years.  Fecal occult blood test (FOBT) of the stool. You may have this test every year starting at age 63.  Flexible sigmoidoscopy or colonoscopy. You may have a sigmoidoscopy every 5 years or a colonoscopy every 10 years starting at age 37.  Hepatitis C blood test.  Hepatitis B blood test.  Sexually transmitted disease (STD) testing.  Diabetes screening. This is done by checking your blood sugar (glucose) after you have not eaten for a while (fasting). You may have this done every 1-3 years.  Bone density scan. This is done to screen for osteoporosis. You may have this done starting at age 61.  Mammogram. This may be done every 1-2 years. Talk to your health care provider about how often you should have regular mammograms. Talk with your health care provider about your test results, treatment options, and if necessary, the need for more tests. Vaccines  Your  health care provider may recommend certain vaccines, such as:  Influenza vaccine. This is recommended every year.  Tetanus, diphtheria, and acellular pertussis (Tdap, Td) vaccine. You may need a Td booster every 10 years.  Zoster vaccine. You may need this after age 40.  Pneumococcal 13-valent  conjugate (PCV13) vaccine. One dose is recommended after age 41.  Pneumococcal polysaccharide (PPSV23) vaccine. One dose is recommended after age 13. Talk to your health care provider about which screenings and vaccines you need and how often you need them. This information is not intended to replace advice given to you by your health care provider. Make sure you discuss any questions you have with your health care provider. Document Released: 04/19/2015 Document Revised: 12/11/2015 Document Reviewed: 01/22/2015 Elsevier Interactive Patient Education  2017 [place] Prevention in the Home Falls can cause injuries. They can happen to people of all ages. There are many things you can do to make your home safe and to help prevent falls. What can I do on the outside of my home?  Regularly fix the edges of walkways and driveways and fix any cracks.  Remove anything that might make you trip as you walk through a door, such as a raised step or threshold.  Trim any bushes or trees on the path to your home.  Use bright outdoor lighting.  Clear any walking paths of anything that might make someone trip, such as rocks or tools.  Regularly check to see if handrails are loose or broken. Make sure that both sides of any steps have handrails.  Any raised decks and porches should have guardrails on the edges.  Have any leaves, snow, or ice cleared regularly.  Use sand or salt on walking paths during winter.  Clean up any spills in your garage right away. This includes oil or grease spills. What can I do in the bathroom?  Use night lights.  Install grab bars by the toilet and in the tub and shower. Do not use towel bars as grab bars.  Use non-skid mats or decals in the tub or shower.  If you need to sit down in the shower, use a plastic, non-slip stool.  Keep the floor dry. Clean up any water that spills on the floor as soon as it happens.  Remove soap buildup in the tub or  shower regularly.  Attach bath mats securely with double-sided non-slip rug tape.  Do not have throw rugs and other things on the floor that can make you trip. What can I do in the bedroom?  Use night lights.  Make sure that you have a light by your bed that is easy to reach.  Do not use any sheets or blankets that are too big for your bed. They should not hang down onto the floor.  Have a firm chair that has side arms. You can use this for support while you get dressed.  Do not have throw rugs and other things on the floor that can make you trip. What can I do in the kitchen?  Clean up any spills right away.  Avoid walking on wet floors.  Keep items that you use a lot in easy-to-reach places.  If you need to reach something above you, use a strong step stool that has a grab bar.  Keep electrical cords out of the way.  Do not use floor polish or wax that makes floors slippery. If you must use wax, use non-skid floor wax.  Do not  have throw rugs and other things on the floor that can make you trip. What can I do with my stairs?  Do not leave any items on the stairs.  Make sure that there are handrails on both sides of the stairs and use them. Fix handrails that are broken or loose. Make sure that handrails are as long as the stairways.  Check any carpeting to make sure that it is firmly attached to the stairs. Fix any carpet that is loose or worn.  Avoid having throw rugs at the top or bottom of the stairs. If you do have throw rugs, attach them to the floor with carpet tape.  Make sure that you have a light switch at the top of the stairs and the bottom of the stairs. If you do not have them, ask someone to add them for you. What else can I do to help prevent falls?  Wear shoes that:  Do not have high heels.  Have rubber bottoms.  Are comfortable and fit you well.  Are closed at the toe. Do not wear sandals.  If you use a stepladder:  Make sure that it is fully  opened. Do not climb a closed stepladder.  Make sure that both sides of the stepladder are locked into place.  Ask someone to hold it for you, if possible.  Clearly mark and make sure that you can see:  Any grab bars or handrails.  First and last steps.  Where the edge of each step is.  Use tools that help you move around (mobility aids) if they are needed. These include:  Canes.  Walkers.  Scooters.  Crutches.  Turn on the lights when you go into a dark area. Replace any light bulbs as soon as they burn out.  Set up your furniture so you have a clear path. Avoid moving your furniture around.  If any of your floors are uneven, fix them.  If there are any pets around you, be aware of where they are.  Review your medicines with your doctor. Some medicines can make you feel dizzy. This can increase your chance of falling. Ask your doctor what other things that you can do to help prevent falls. This information is not intended to replace advice given to you by your health care provider. Make sure you discuss any questions you have with your health care provider. Document Released: 01/17/2009 Document Revised: 08/29/2015 Document Reviewed: 04/27/2014 Elsevier Interactive Patient Education  2017 [employer].

## 2019-05-12 ENCOUNTER — Encounter: Payer: Medicare Other | Admitting: Family Medicine

## 2019-05-15 ENCOUNTER — Other Ambulatory Visit: Payer: Self-pay

## 2019-05-15 ENCOUNTER — Encounter: Payer: Self-pay | Admitting: Family Medicine

## 2019-05-15 ENCOUNTER — Ambulatory Visit (INDEPENDENT_AMBULATORY_CARE_PROVIDER_SITE_OTHER): Payer: Medicare PPO | Admitting: Family Medicine

## 2019-05-15 VITALS — BP 133/76 | HR 64 | Temp 98.0°F | Ht 61.5 in | Wt 320.0 lb

## 2019-05-15 DIAGNOSIS — G8929 Other chronic pain: Secondary | ICD-10-CM

## 2019-05-15 DIAGNOSIS — I10 Essential (primary) hypertension: Secondary | ICD-10-CM

## 2019-05-15 DIAGNOSIS — E1142 Type 2 diabetes mellitus with diabetic polyneuropathy: Secondary | ICD-10-CM | POA: Diagnosis not present

## 2019-05-15 DIAGNOSIS — Z0000 Encounter for general adult medical examination without abnormal findings: Secondary | ICD-10-CM | POA: Diagnosis not present

## 2019-05-15 DIAGNOSIS — N183 Chronic kidney disease, stage 3 unspecified: Secondary | ICD-10-CM | POA: Diagnosis not present

## 2019-05-15 DIAGNOSIS — Z1231 Encounter for screening mammogram for malignant neoplasm of breast: Secondary | ICD-10-CM | POA: Diagnosis not present

## 2019-05-15 DIAGNOSIS — M545 Low back pain, unspecified: Secondary | ICD-10-CM

## 2019-05-15 DIAGNOSIS — E1159 Type 2 diabetes mellitus with other circulatory complications: Secondary | ICD-10-CM

## 2019-05-15 DIAGNOSIS — E785 Hyperlipidemia, unspecified: Secondary | ICD-10-CM

## 2019-05-15 DIAGNOSIS — E1169 Type 2 diabetes mellitus with other specified complication: Secondary | ICD-10-CM | POA: Diagnosis not present

## 2019-05-15 LAB — UA/M W/RFLX CULTURE, ROUTINE
Bilirubin, UA: NEGATIVE
Glucose, UA: NEGATIVE
Ketones, UA: NEGATIVE
Leukocytes,UA: NEGATIVE
Nitrite, UA: NEGATIVE
Protein,UA: NEGATIVE
RBC, UA: NEGATIVE
Specific Gravity, UA: 1.015 (ref 1.005–1.030)
Urobilinogen, Ur: 0.2 mg/dL (ref 0.2–1.0)
pH, UA: 5.5 (ref 5.0–7.5)

## 2019-05-15 NOTE — Progress Notes (Signed)
BP 133/76   Pulse 64   Temp 98 F (36.7 C) (Oral)   Ht 5' 1.5" (1.562 m)   Wt (!) 320 lb (145.2 kg)   SpO2 97%   BMI 59.48 kg/m    Subjective:    Patient ID: Anna Juarez, female    DOB: 06/19/1950, 69 y.o.   MRN: [id number]  HPI: Duana Briaja Kingsbury is a 69 y.o. female presenting on 05/15/2019 for comprehensive medical examination. Current medical complaints include:see below  HTN - Home BPs running around 120s-130s/80s range.   DM - Home BSs running around 150-190 around lunchtime. Does not check fasting sugars. No low blood sugar spells she's aware of. Not following strict diet and does not exercise due to her chronic pain issues.   HLD - on lovastatin and welchol, tolerating well without side effects. Denies claudication, myalgias.   Neuropathy pain still very bothersome for her, left bottom of foot is worst. Lyrica does seem to help.   Chronic back issues - only taking tramadol sporadically, sometimes able to go several weeks between doses.   She currently lives with: Menopausal Symptoms: no  Depression Screen done today and results listed below:  Depression screen [facility] 2/9 05/15/2019 05/11/2019 01/03/2018 05/26/2017 11/24/2016  Decreased Interest 0 0 0 0 0  Down, Depressed, Hopeless 0 0 0 0 0  PHQ - 2 Score 0 0 0 0 0  Altered sleeping 0 - - - -  Tired, decreased energy 0 - - - -  Change in appetite 0 - - - -  Feeling bad or failure about yourself  0 - - - -  Trouble concentrating 0 - - - -  Moving slowly or fidgety/restless 0 - - - -  Suicidal thoughts 0 - - - -  PHQ-9 Score 0 - - - -    The patient does not have a history of falls. I did complete a risk assessment for falls. A plan of care for falls was documented.   Past Medical History:  Past Medical History:  Diagnosis Date  . Chronic kidney disease   . Diabetes mellitus without complication ([place])   . Extreme obesity   . Fatty liver   . Hyperlipidemia   . Hypertension   . Radiculopathy, cervical   .  Renal insufficiency    pt states she has chronic kidney disease.     Surgical History:  Past Surgical History:  Procedure Laterality Date  . ABDOMINAL HYSTERECTOMY    . JOINT REPLACEMENT    . SPINE SURGERY      Medications:  Current Outpatient Medications on File Prior to Visit  Medication Sig  . benazepril (LOTENSIN) 40 MG tablet Take 1 tablet (40 mg total) by mouth daily.  . colesevelam (WELCHOL) 625 MG tablet Take 3 tablets (1,875 mg total) by mouth 2 (two) times daily.  Marland Kitchen glipiZIDE (GLUCOTROL) 5 MG tablet TAKE 1 TABLET (5 MG TOTAL) BY MOUTH 2 (TWO) TIMES DAILY BEFORE A MEAL.  Marland Kitchen lovastatin (MEVACOR) 40 MG tablet Take 1 tablet (40 mg total) by mouth at bedtime.  . metoprolol succinate (TOPROL-XL) 50 MG 24 hr tablet TAKE 1 TABLET (50 MG TOTAL) BY MOUTH DAILY. TAKE WITH OR IMMEDIATELY FOLLOWING A MEAL.  Marland Kitchen NEXIUM 40 MG capsule TAKE 1 CAPSULE BY MOUTH EVERY DAY  . pregabalin (LYRICA) 150 MG capsule TAKE 1 CAPSULE (150 MG TOTAL) BY MOUTH 3 (THREE) TIMES DAILY.  . sitaGLIPtin (JANUVIA) 100 MG tablet Take 1 tablet (100 mg total) by  mouth daily.  . traMADol (ULTRAM) 50 MG tablet Take 1 tablet (50 mg total) by mouth every 8 (eight) hours as needed.   No current facility-administered medications on file prior to visit.    Allergies:  Allergies  Allergen Reactions  . Actos [Pioglitazone]   . Metformin And Related Diarrhea  . Victoza [Liraglutide] Nausea And Vomiting    Social History:  Social History   Socioeconomic History  . Marital status: Married    Spouse name: Not on file  . Number of children: Not on file  . Years of education: Not on file  . Highest education level: 12th grade  Occupational History  . Occupation: retired   Tobacco Use  . Smoking status: Never Smoker  . Smokeless tobacco: Never Used  Substance and Sexual Activity  . Alcohol use: No  . Drug use: No  . Sexual activity: Not on file  Other Topics Concern  . Not on file  Social History Narrative    [place]; lives with husband; never smoker; no alcohol; retd. Manufacturing.    Social Determinants of Health   Financial Resource Strain:   . Difficulty of Paying Living Expenses: Not on file  Food Insecurity:   . Worried About Charity fundraiser in the Last Year: Not on file  . Ran Out of Food in the Last Year: Not on file  Transportation Needs:   . Lack of Transportation (Medical): Not on file  . Lack of Transportation (Non-Medical): Not on file  Physical Activity:   . Days of Exercise per Week: Not on file  . Minutes of Exercise per Session: Not on file  Stress:   . Feeling of Stress : Not on file  Social Connections:   . Frequency of Communication with Friends and Family: Not on file  . Frequency of Social Gatherings with Friends and Family: Not on file  . Attends Religious Services: Not on file  . Active Member of Clubs or Organizations: Not on file  . Attends Archivist Meetings: Not on file  . Marital Status: Not on file  Intimate Partner Violence:   . Fear of Current or Ex-Partner: Not on file  . Emotionally Abused: Not on file  . Physically Abused: Not on file  . Sexually Abused: Not on file   Social History   Tobacco Use  Smoking Status Never Smoker  Smokeless Tobacco Never Used   Social History   Substance and Sexual Activity  Alcohol Use No    Family History:  Family History  Problem Relation Age of Onset  . Diabetes Mother   . Heart disease Mother   . Hypertension Mother   . Heart disease Father   . Cancer Brother   . Hypertension Brother   . Breast cancer Maternal Aunt   . Breast cancer Paternal Aunt     Past medical history, surgical history, medications, allergies, family history and social history reviewed with patient today and changes made to appropriate areas of the chart.   Review of Systems - General ROS: negative Psychological ROS: negative Ophthalmic ROS: negative ENT ROS: negative Allergy and Immunology  ROS: negative Hematological and Lymphatic ROS: negative Endocrine ROS: negative Breast ROS: negative for breast lumps Respiratory ROS: no cough, shortness of breath, or wheezing Cardiovascular ROS: no chest pain or dyspnea on exertion Gastrointestinal ROS: no abdominal pain, change in bowel habits, or black or bloody stools Genito-Urinary ROS: no dysuria, trouble voiding, or hematuria Musculoskeletal ROS: negative Neurological ROS:  no TIA or stroke symptoms Dermatological ROS: negative All other ROS negative except what is listed above and in the HPI.      Objective:    BP 133/76   Pulse 64   Temp 98 F (36.7 C) (Oral)   Ht 5' 1.5" (1.562 m)   Wt (!) 320 lb (145.2 kg)   SpO2 97%   BMI 59.48 kg/m   Wt Readings from Last 3 Encounters:  05/15/19 (!) 320 lb (145.2 kg)  02/08/19 (!) 308 lb (139.7 kg)  08/31/18 (!) 303 lb (137.4 kg)    Physical Exam Vitals and nursing note reviewed.  Constitutional:      General: She is not in acute distress.    Appearance: She is well-developed.  HENT:     Head: Atraumatic.     Right Ear: External ear normal.     Left Ear: External ear normal.     Nose: Nose normal.     Mouth/Throat:     Pharynx: No oropharyngeal exudate.  Eyes:     General: No scleral icterus.    Conjunctiva/sclera: Conjunctivae normal.     Pupils: Pupils are equal, round, and reactive to light.  Neck:     Thyroid: No thyromegaly.  Cardiovascular:     Rate and Rhythm: Normal rate and regular rhythm.     Heart sounds: Normal heart sounds.  Pulmonary:     Effort: Pulmonary effort is normal. No respiratory distress.     Breath sounds: Normal breath sounds.  Chest:     Comments: Breast exam declined Abdominal:     General: Bowel sounds are normal.     Palpations: Abdomen is soft. There is no mass.     Tenderness: There is no abdominal tenderness.  Genitourinary:    Comments: GU exam declined Musculoskeletal:        General: No tenderness. Normal range of motion.      Cervical back: Normal range of motion and neck supple.  Lymphadenopathy:     Cervical: No cervical adenopathy.  Skin:    General: Skin is warm and dry.     Findings: No rash.  Neurological:     Mental Status: She is alert and oriented to person, place, and time.     Cranial Nerves: No cranial nerve deficit.  Psychiatric:        Behavior: Behavior normal.    Diabetic Foot Exam - Simple   Simple Foot Form Diabetic Foot exam was performed with the following findings: Yes 05/15/2019  1:06 PM  Visual Inspection No deformities, no ulcerations, no other skin breakdown bilaterally: Yes Sensation Testing See comments: Yes Pulse Check Posterior Tibialis and Dorsalis pulse intact bilaterally: Yes Comments Decreased sensation to light touch diffusely b/l feet     Results for orders placed or performed in visit on 05/15/19  CBC with Differential/Platelet  Result Value Ref Range   WBC 6.0 3.4 - 10.8 x10E3/uL   RBC 4.29 3.77 - 5.28 x10E6/uL   Hemoglobin 13.2 11.1 - 15.9 g/dL   Hematocrit 39.0 34.0 - 46.6 %   MCV 91 79 - 97 fL   MCH 30.8 26.6 - 33.0 pg   MCHC 33.8 31.5 - 35.7 g/dL   RDW 12.6 11.7 - 15.4 %   Platelets 178 150 - 450 x10E3/uL   Neutrophils 63 Not Estab. %   Lymphs 25 Not Estab. %   Monocytes 9 Not Estab. %   Eos 2 Not Estab. %   Basos 1 Not Estab. %  Neutrophils Absolute 3.9 1.4 - 7.0 x10E3/uL   Lymphocytes Absolute 1.5 0.7 - 3.1 x10E3/uL   Monocytes Absolute 0.5 0.1 - 0.9 x10E3/uL   EOS (ABSOLUTE) 0.1 0.0 - 0.4 x10E3/uL   Basophils Absolute 0.0 0.0 - 0.2 x10E3/uL   Immature Granulocytes 0 Not Estab. %   Immature Grans (Abs) 0.0 0.0 - 0.1 x10E3/uL  Comprehensive metabolic panel  Result Value Ref Range   Glucose 113 (H) 65 - 99 mg/dL   BUN 15 8 - 27 mg/dL   Creatinine, Ser 1.30 (H) 0.57 - 1.00 mg/dL   GFR calc non Af Amer 42 (L) >59 mL/min/1.73   GFR calc Af Amer 48 (L) >59 mL/min/1.73   BUN/Creatinine Ratio 12 12 - 28   Sodium 142 134 - 144 mmol/L    Potassium 4.2 3.5 - 5.2 mmol/L   Chloride 103 96 - 106 mmol/L   CO2 24 20 - 29 mmol/L   Calcium 9.1 8.7 - 10.3 mg/dL   Total Protein 7.1 6.0 - 8.5 g/dL   Albumin 4.1 3.8 - 4.8 g/dL   Globulin, Total 3.0 1.5 - 4.5 g/dL   Albumin/Globulin Ratio 1.4 1.2 - 2.2   Bilirubin Total 0.4 0.0 - 1.2 mg/dL   Alkaline Phosphatase 59 39 - 117 IU/L   AST 52 (H) 0 - 40 IU/L   ALT 34 (H) 0 - 32 IU/L  Lipid Panel w/o Chol/HDL Ratio  Result Value Ref Range   Cholesterol, Total 155 100 - 199 mg/dL   Triglycerides 252 (H) 0 - 149 mg/dL   HDL 53 >39 mg/dL   VLDL Cholesterol Cal 40 5 - 40 mg/dL   LDL Chol Calc (NIH) 62 0 - 99 mg/dL  UA/M w/rflx Culture, Routine   Specimen: Urine   URINE  Result Value Ref Range   Specific Gravity, UA 1.015 1.005 - 1.030   pH, UA 5.5 5.0 - 7.5   Color, UA Yellow Yellow   Appearance Ur Clear Clear   Leukocytes,UA Negative Negative   Protein,UA Negative Negative/Trace   Glucose, UA Negative Negative   Ketones, UA Negative Negative   RBC, UA Negative Negative   Bilirubin, UA Negative Negative   Urobilinogen, Ur 0.2 0.2 - 1.0 mg/dL   Nitrite, UA Negative Negative  HgB A1c  Result Value Ref Range   Hgb A1c MFr Bld 6.8 (H) 4.8 - 5.6 %   Est. average glucose Bld gHb Est-mCnc 148 mg/dL      Assessment & Plan:   Problem List Items Addressed This Visit      Cardiovascular and Mediastinum   Hypertension associated with diabetes ([place]) - Primary    BPs stable and under good control, continue current regimen      Relevant Orders   CBC with Differential/Platelet (Completed)   Comprehensive metabolic panel (Completed)     Endocrine   DM type 2 with diabetic peripheral neuropathy ([facility])    Recheck A1C, adjust as needed. Continue current regimen and work on diet and exercise changes for improved control. Continue lyrica for neuropathy control      Relevant Orders   UA/M w/rflx Culture, Routine (Completed)   HgB A1c (Completed)   Hyperlipidemia associated with type 2  diabetes mellitus ([facility])    Recheck lipids, adjust as needed. Continue current regimen      Relevant Orders   Lipid Panel w/o Chol/HDL Ratio (Completed)     Genitourinary   CKD (chronic kidney disease) stage 3, GFR 30-59 ml/min     Other  Back pain    Chronic, stable on prn tramadol regimen. Continue rare cautious use       Other Visit Diagnoses    Annual physical exam       Encounter for screening mammogram for malignant neoplasm of breast       Relevant Orders   MM DIGITAL SCREENING BILATERAL       Follow up plan: Return in about 6 months (around 11/12/2019) for 6 month f/u.   LABORATORY TESTING:  - Pap smear: not applicable  IMMUNIZATIONS:   - Tdap: Tetanus vaccination status reviewed: last tetanus booster within 10 years. - Influenza: Up to date - Pneumovax: Up to date - Prevnar: Up to date - HPV: Not applicable - Zostavax vaccine: Refused  SCREENING: -Mammogram: Ordered today  - Colonoscopy: Up to date  - Bone Density: Up to date   PATIENT COUNSELING:   Advised to take 1 mg of folate supplement per day if capable of pregnancy.   Sexuality: Discussed sexually transmitted diseases, partner selection, use of condoms, avoidance of unintended pregnancy  and contraceptive alternatives.   Advised to avoid cigarette smoking.  I discussed with the patient that most people either abstain from alcohol or drink within safe limits (<=14/week and <=4 drinks/occasion for males, <=7/weeks and <= 3 drinks/occasion for females) and that the risk for alcohol disorders and other health effects rises proportionally with the number of drinks per week and how often a drinker exceeds daily limits.  Discussed cessation/primary prevention of drug use and availability of treatment for abuse.   Diet: Encouraged to adjust caloric intake to maintain  or achieve ideal body weight, to reduce intake of dietary saturated fat and total fat, to limit sodium intake by avoiding high sodium foods  and not adding table salt, and to maintain adequate dietary potassium and calcium preferably from fresh fruits, vegetables, and low-fat dairy products.    stressed the importance of regular exercise  Injury prevention: Discussed safety belts, safety helmets, smoke detector, smoking near bedding or upholstery.   Dental health: Discussed importance of regular tooth brushing, flossing, and dental visits.    NEXT PREVENTATIVE PHYSICAL DUE IN 1 YEAR. Return in about 6 months (around 11/12/2019) for 6 month f/u.

## 2019-05-16 LAB — CBC WITH DIFFERENTIAL/PLATELET
Basophils Absolute: 0 10*3/uL (ref 0.0–0.2)
Basos: 1 %
EOS (ABSOLUTE): 0.1 10*3/uL (ref 0.0–0.4)
Eos: 2 %
Hematocrit: 39 % (ref 34.0–46.6)
Hemoglobin: 13.2 g/dL (ref 11.1–15.9)
Immature Grans (Abs): 0 10*3/uL (ref 0.0–0.1)
Immature Granulocytes: 0 %
Lymphocytes Absolute: 1.5 10*3/uL (ref 0.7–3.1)
Lymphs: 25 %
MCH: 30.8 pg (ref 26.6–33.0)
MCHC: 33.8 g/dL (ref 31.5–35.7)
MCV: 91 fL (ref 79–97)
Monocytes Absolute: 0.5 10*3/uL (ref 0.1–0.9)
Monocytes: 9 %
Neutrophils Absolute: 3.9 10*3/uL (ref 1.4–7.0)
Neutrophils: 63 %
Platelets: 178 10*3/uL (ref 150–450)
RBC: 4.29 x10E6/uL (ref 3.77–5.28)
RDW: 12.6 % (ref 11.7–15.4)
WBC: 6 10*3/uL (ref 3.4–10.8)

## 2019-05-16 LAB — COMPREHENSIVE METABOLIC PANEL
ALT: 34 IU/L — ABNORMAL HIGH (ref 0–32)
AST: 52 IU/L — ABNORMAL HIGH (ref 0–40)
Albumin/Globulin Ratio: 1.4 (ref 1.2–2.2)
Albumin: 4.1 g/dL (ref 3.8–4.8)
Alkaline Phosphatase: 59 IU/L (ref 39–117)
BUN/Creatinine Ratio: 12 (ref 12–28)
BUN: 15 mg/dL (ref 8–27)
Bilirubin Total: 0.4 mg/dL (ref 0.0–1.2)
CO2: 24 mmol/L (ref 20–29)
Calcium: 9.1 mg/dL (ref 8.7–10.3)
Chloride: 103 mmol/L (ref 96–106)
Creatinine, Ser: 1.3 mg/dL — ABNORMAL HIGH (ref 0.57–1.00)
GFR calc Af Amer: 48 mL/min/{1.73_m2} — ABNORMAL LOW (ref >59)
GFR calc non Af Amer: 42 mL/min/{1.73_m2} — ABNORMAL LOW (ref >59)
Globulin, Total: 3 g/dL (ref 1.5–4.5)
Glucose: 113 mg/dL — ABNORMAL HIGH (ref 65–99)
Potassium: 4.2 mmol/L (ref 3.5–5.2)
Sodium: 142 mmol/L (ref 134–144)
Total Protein: 7.1 g/dL (ref 6.0–8.5)

## 2019-05-16 LAB — LIPID PANEL W/O CHOL/HDL RATIO
Cholesterol, Total: 155 mg/dL (ref 100–199)
HDL: 53 mg/dL (ref >39)
LDL Chol Calc (NIH): 62 mg/dL (ref 0–99)
Triglycerides: 252 mg/dL — ABNORMAL HIGH (ref 0–149)
VLDL Cholesterol Cal: 40 mg/dL (ref 5–40)

## 2019-05-16 LAB — HEMOGLOBIN A1C
Est. average glucose Bld gHb Est-mCnc: 148 mg/dL
Hgb A1c MFr Bld: 6.8 % — ABNORMAL HIGH (ref 4.8–5.6)

## 2019-05-22 NOTE — Assessment & Plan Note (Signed)
Recheck lipids, adjust as needed. Continue current regimen 

## 2019-05-22 NOTE — Assessment & Plan Note (Signed)
Chronic, stable on prn tramadol regimen. Continue rare cautious use

## 2019-05-22 NOTE — Assessment & Plan Note (Signed)
BPs stable and under good control, continue current regimen 

## 2019-05-22 NOTE — Assessment & Plan Note (Addendum)
Recheck A1C, adjust as needed. Continue current regimen and work on diet and exercise changes for improved control. Continue lyrica for neuropathy control

## 2019-06-15 ENCOUNTER — Other Ambulatory Visit: Payer: Self-pay

## 2019-06-15 MED ORDER — NEXIUM 40 MG PO CPDR: DELAYED_RELEASE_CAPSULE | ORAL | 1 refills | Status: DC

## 2019-06-15 NOTE — Telephone Encounter (Signed)
Refill request for Esomeprazole 40mg  LOV: 05/15/2019 Next Appt: none

## 2019-06-18 ENCOUNTER — Other Ambulatory Visit: Payer: Self-pay | Admitting: Family Medicine

## 2019-06-18 NOTE — Telephone Encounter (Signed)
Requested Prescriptions  Pending Prescriptions Disp Refills  . ONETOUCH ULTRA test strip [employer] Med Name: Ackerman TEST STRP] 100 strip 11    Sig: USE 1 TO TEST BLOOD SUGAR DAILY AS NEEDED     Endocrinology: Diabetes - Testing Supplies Passed - 06/18/2019 12:58 AM      Passed - Valid encounter within last 12 months    Recent Outpatient Visits          1 month ago Hypertension associated with diabetes [facility])   [facility] Merrie Roof [place], [place]   4 months ago Hypertension associated with diabetes [facility])   [facility] Volney American, [place]   8 months ago Essential hypertension   [place], Mark A, MD   10 months ago [place], [place], [place]   11 months ago DM type 2 with diabetic peripheral neuropathy [facility])   [facility] Crissman, Jeannette How, MD      Future Appointments            In 62 months [place], [facility]

## 2019-08-14 ENCOUNTER — Other Ambulatory Visit: Payer: Self-pay | Admitting: Family Medicine

## 2019-08-14 DIAGNOSIS — I10 Essential (primary) hypertension: Secondary | ICD-10-CM

## 2019-08-14 NOTE — Telephone Encounter (Signed)
Requested Prescriptions  Pending Prescriptions Disp Refills  . metoprolol succinate (TOPROL-XL) 50 MG 24 hr tablet [Pharmacy Med Name: METOPROLOL SUCC ER 50 MG TAB] 90 tablet 0    Sig: TAKE 1 TABLET (50 MG TOTAL) BY MOUTH DAILY. TAKE WITH OR IMMEDIATELY FOLLOWING A MEAL.     Cardiovascular:  Beta Blockers Passed - 08/14/2019  5:08 PM      Passed - Last BP in normal range    BP Readings from Last 1 Encounters:  05/15/19 133/76         Passed - Last Heart Rate in normal range    Pulse Readings from Last 1 Encounters:  05/15/19 64         Passed - Valid encounter within last 6 months    Recent Outpatient Visits          3 months ago Hypertension associated with diabetes [facility])   [facility] Volney [facility], [place]   6 months ago Hypertension associated with diabetes [facility])   [place], [place], [place]   9 months ago Essential hypertension   [place], Mark A, MD   11 months ago [place]   [facility], [place], [place]   1 year ago DM type 2 with diabetic peripheral neuropathy [facility])   [facility] Crissman, Jeannette How, MD      Future Appointments            In 9 months [place], [place]

## 2019-09-12 ENCOUNTER — Other Ambulatory Visit: Payer: Self-pay | Admitting: Family Medicine

## 2019-09-12 DIAGNOSIS — E1142 Type 2 diabetes mellitus with diabetic polyneuropathy: Secondary | ICD-10-CM

## 2019-09-12 NOTE — Telephone Encounter (Signed)
Requested medication (s) are due for refill today - yes  Requested medication (s) are on the active medication list -yes  Future visit scheduled -ys  Last refill: 06/12/19  Notes to clinic: Request for non delegated Rx  Requested Prescriptions  Pending Prescriptions Disp Refills   pregabalin (LYRICA) 150 MG capsule [Pharmacy Med Name: PREGABALIN 150 MG CAPSULE] 270 capsule     Sig: TAKE 1 CAPSULE (150 MG TOTAL) BY MOUTH 3 (THREE) TIMES DAILY.      Not Delegated - Neurology:  Anticonvulsants - Controlled Failed - 09/12/2019  4:04 PM      Failed - This refill cannot be delegated      Passed - Valid encounter within last 12 months    Recent Outpatient Visits           4 months ago Hypertension associated with diabetes [facility])   [facility] Volney American, [place]   7 months ago Hypertension associated with diabetes [facility])   [place], [place], [place]   10 months ago Essential hypertension   [place], Jeannette How, MD   1 year ago [place], [place], [place]   1 year ago DM type 2 with diabetic peripheral neuropathy [facility])   [place], MD       Future Appointments             In 8 months [place], [facility]                 Requested Prescriptions  Pending Prescriptions Disp Refills   pregabalin (LYRICA) 150 MG capsule [Pharmacy Med Name: PREGABALIN 150 MG CAPSULE] 270 capsule     Sig: TAKE 1 CAPSULE (150 MG TOTAL) BY MOUTH 3 (THREE) TIMES DAILY.      Not Delegated - Neurology:  Anticonvulsants - Controlled Failed - 09/12/2019  4:04 PM      Failed - This refill cannot be delegated      Passed - Valid encounter within last 12 months    Recent Outpatient Visits           4 months ago Hypertension associated with diabetes [facility])   [facility] Volney [facility], [place]   7 months ago Hypertension associated with diabetes  [facility])   [facility] Merrie Roof [place], [place]   10 months ago Essential hypertension   [place], Mark A, MD   1 year ago [place]   [facility], [place], [place]   1 year ago DM type 2 with diabetic peripheral neuropathy [facility])   [facility] Crissman, Jeannette How, MD       Future Appointments             In 8 months [place], [facility]

## 2019-09-12 NOTE — Telephone Encounter (Signed)
Routing to provider  

## 2019-09-16 ENCOUNTER — Other Ambulatory Visit: Payer: Self-pay | Admitting: Family Medicine

## 2019-09-16 DIAGNOSIS — I10 Essential (primary) hypertension: Secondary | ICD-10-CM

## 2019-09-22 ENCOUNTER — Other Ambulatory Visit: Payer: Self-pay | Admitting: Family Medicine

## 2019-09-22 DIAGNOSIS — E1142 Type 2 diabetes mellitus with diabetic polyneuropathy: Secondary | ICD-10-CM

## 2019-09-22 DIAGNOSIS — E785 Hyperlipidemia, unspecified: Secondary | ICD-10-CM

## 2019-11-06 ENCOUNTER — Ambulatory Visit: Payer: Medicare PPO | Admitting: Family Medicine

## 2019-11-10 ENCOUNTER — Ambulatory Visit: Payer: Medicare PPO | Admitting: Family Medicine

## 2019-11-10 ENCOUNTER — Other Ambulatory Visit: Payer: Self-pay

## 2019-11-10 ENCOUNTER — Encounter: Payer: Self-pay | Admitting: Family Medicine

## 2019-11-10 VITALS — BP 118/73 | HR 55 | Temp 98.7°F | Wt 322.0 lb

## 2019-11-10 DIAGNOSIS — G8929 Other chronic pain: Secondary | ICD-10-CM

## 2019-11-10 DIAGNOSIS — E1142 Type 2 diabetes mellitus with diabetic polyneuropathy: Secondary | ICD-10-CM | POA: Diagnosis not present

## 2019-11-10 DIAGNOSIS — E1169 Type 2 diabetes mellitus with other specified complication: Secondary | ICD-10-CM

## 2019-11-10 DIAGNOSIS — N183 Chronic kidney disease, stage 3 unspecified: Secondary | ICD-10-CM

## 2019-11-10 DIAGNOSIS — E1159 Type 2 diabetes mellitus with other circulatory complications: Secondary | ICD-10-CM

## 2019-11-10 DIAGNOSIS — M545 Low back pain, unspecified: Secondary | ICD-10-CM

## 2019-11-10 DIAGNOSIS — I10 Essential (primary) hypertension: Secondary | ICD-10-CM

## 2019-11-10 DIAGNOSIS — E785 Hyperlipidemia, unspecified: Secondary | ICD-10-CM

## 2019-11-10 MED ORDER — SITAGLIPTIN PHOSPHATE 100 MG PO TABS: 100.0000 mg | ORAL_TABLET | Freq: Every day | ORAL | 1 refills | Status: DC

## 2019-11-10 MED ORDER — GLIPIZIDE 5 MG PO TABS: 5.0000 mg | ORAL_TABLET | Freq: Two times a day (BID) | ORAL | 1 refills | Status: DC

## 2019-11-10 MED ORDER — METOPROLOL SUCCINATE ER 50 MG PO TB24: 50.0000 mg | ORAL_TABLET | Freq: Every day | ORAL | 1 refills | Status: DC

## 2019-11-10 MED ORDER — NEXIUM 40 MG PO CPDR: DELAYED_RELEASE_CAPSULE | ORAL | 1 refills | Status: DC

## 2019-11-10 MED ORDER — LOVASTATIN 40 MG PO TABS: ORAL_TABLET | ORAL | 1 refills | Status: DC

## 2019-11-10 MED ORDER — TRAMADOL HCL 50 MG PO TABS: 50.0000 mg | ORAL_TABLET | Freq: Three times a day (TID) | ORAL | 1 refills | Status: DC | PRN

## 2019-11-10 MED ORDER — BENAZEPRIL HCL 40 MG PO TABS: 40.0000 mg | ORAL_TABLET | Freq: Every day | ORAL | 1 refills | Status: DC

## 2019-11-10 MED ORDER — PREGABALIN 150 MG PO CAPS: 150.0000 mg | ORAL_CAPSULE | Freq: Three times a day (TID) | ORAL | 1 refills | Status: DC

## 2019-11-10 MED ORDER — COLESEVELAM HCL 625 MG PO TABS: 1875.0000 mg | ORAL_TABLET | Freq: Two times a day (BID) | ORAL | 1 refills | Status: DC

## 2019-11-10 NOTE — Assessment & Plan Note (Signed)
Recheck labs, stay well hydrated

## 2019-11-10 NOTE — Assessment & Plan Note (Signed)
Stable and well controlled on lyrica, prn tramadol. Continue cautious use of that, precautions reviewed. 60 tabs should last at minimum 6 months for her

## 2019-11-10 NOTE — Assessment & Plan Note (Signed)
Recheck A1C, continue present medications, lifestyle modifications

## 2019-11-10 NOTE — Assessment & Plan Note (Signed)
BPs stable and under good control, continue present medications and home monitoring. DASH diet, exercise reviewed

## 2019-11-10 NOTE — Progress Notes (Signed)
BP 118/73    Pulse (!) 55    Temp 98.7 F (37.1 C) (Oral)    Wt (!) 322 lb (146.1 kg)    SpO2 95%    BMI 59.86 kg/m    Subjective:    Patient ID: Anna Juarez, female    DOB: 13-Feb-1951, 69 y.o.   MRN: [id number]  HPI: Anna Juarez is a 69 y.o. female  Chief Complaint  Patient presents with   Hypertension   Diabetes   Hyperlipidemia   Here today for 6 month f/u chronic conditions.   HTN - On metoprolol and benazepril, taking faithfully without side effects. Denies CP, SOB, HAs, dizziness. Not following strict diet or exercising  HLD - taking lovastatin and welchol, denies myalgias, claudication, CP, SOB.   DM - on januvia, glipizide. Home BSs running around 110-130s range. Denies low blood sugar spells.    Chronic back pain - Taking the tramadol only as needed, typically based on how active she's had to be. Put up her garden last month so had to take more than she usually would but still making 60 tabs last more than 6 months. Also takes the lyrica and tylenol prn.   Relevant past medical, surgical, family and social history reviewed and updated as indicated. Interim medical history since our last visit reviewed. Allergies and medications reviewed and updated.  Review of Systems  Per HPI unless specifically indicated above     Objective:    BP 118/73    Pulse (!) 55    Temp 98.7 F (37.1 C) (Oral)    Wt (!) 322 lb (146.1 kg)    SpO2 95%    BMI 59.86 kg/m   Wt Readings from Last 3 Encounters:  11/10/19 (!) 322 lb (146.1 kg)  05/15/19 (!) 320 lb (145.2 kg)  02/08/19 (!) 308 lb (139.7 kg)    Physical Exam Vitals and nursing note reviewed.  Constitutional:      Appearance: Normal appearance. She is not ill-appearing.  HENT:     Head: Atraumatic.     Right Ear: Tympanic membrane normal.     Left Ear: Tympanic membrane normal.     Nose: Nose normal.     Mouth/Throat:     Mouth: Mucous membranes are moist.     Pharynx: Oropharynx is clear.  Eyes:      Extraocular Movements: Extraocular movements intact.     Conjunctiva/sclera: Conjunctivae normal.  Cardiovascular:     Rate and Rhythm: Normal rate and regular rhythm.     Heart sounds: Normal heart sounds.  Pulmonary:     Effort: Pulmonary effort is normal.     Breath sounds: Normal breath sounds.  Musculoskeletal:        General: Normal range of motion.     Cervical back: Normal range of motion and neck supple.  Skin:    General: Skin is warm and dry.  Neurological:     Mental Status: She is alert and oriented to person, place, and time.  Psychiatric:        Mood and Affect: Mood normal.        Thought Content: Thought content normal.        Judgment: Judgment normal.     Results for orders placed or performed in visit on 05/15/19  CBC with Differential/Platelet  Result Value Ref Range   WBC 6.0 3.4 - 10.8 x10E3/uL   RBC 4.29 3.77 - 5.28 x10E6/uL   Hemoglobin 13.2 11.1 - 15.9 g/dL  Hematocrit 39.0 34.0 - 46.6 %   MCV 91 79 - 97 fL   MCH 30.8 26.6 - 33.0 pg   MCHC 33.8 31 - 35 g/dL   RDW 12.6 11.7 - 15.4 %   Platelets 178 150 - 450 x10E3/uL   Neutrophils 63 Not Estab. %   Lymphs 25 Not Estab. %   Monocytes 9 Not Estab. %   Eos 2 Not Estab. %   Basos 1 Not Estab. %   Neutrophils Absolute 3.9 1 - 7 x10E3/uL   Lymphocytes Absolute 1.5 0 - 3 x10E3/uL   Monocytes Absolute 0.5 0 - 0 x10E3/uL   EOS (ABSOLUTE) 0.1 0.0 - 0.4 x10E3/uL   Basophils Absolute 0.0 0 - 0 x10E3/uL   Immature Granulocytes 0 Not Estab. %   Immature Grans (Abs) 0.0 0.0 - 0.1 x10E3/uL  Comprehensive metabolic panel  Result Value Ref Range   Glucose 113 (H) 65 - 99 mg/dL   BUN 15 8 - 27 mg/dL   Creatinine, Ser 1.30 (H) 0.57 - 1.00 mg/dL   GFR calc non Af Amer 42 (L) >59 mL/min/1.73   GFR calc Af Amer 48 (L) >59 mL/min/1.73   BUN/Creatinine Ratio 12 12 - 28   Sodium 142 134 - 144 mmol/L   Potassium 4.2 3.5 - 5.2 mmol/L   Chloride 103 96 - 106 mmol/L   CO2 24 20 - 29 mmol/L   Calcium 9.1 8.7 -  10.3 mg/dL   Total Protein 7.1 6.0 - 8.5 g/dL   Albumin 4.1 3.8 - 4.8 g/dL   Globulin, Total 3.0 1.5 - 4.5 g/dL   Albumin/Globulin Ratio 1.4 1.2 - 2.2   Bilirubin Total 0.4 0.0 - 1.2 mg/dL   Alkaline Phosphatase 59 39 - 117 IU/L   AST 52 (H) 0 - 40 IU/L   ALT 34 (H) 0 - 32 IU/L  Lipid Panel w/o Chol/HDL Ratio  Result Value Ref Range   Cholesterol, Total 155 100 - 199 mg/dL   Triglycerides 252 (H) 0 - 149 mg/dL   HDL 53 >39 mg/dL   VLDL Cholesterol Cal 40 5 - 40 mg/dL   LDL Chol Calc (NIH) 62 0 - 99 mg/dL  UA/M w/rflx Culture, Routine   Specimen: Urine   URINE  Result Value Ref Range   Specific Gravity, UA 1.015 1.005 - 1.030   pH, UA 5.5 5.0 - 7.5   Color, UA Yellow Yellow   Appearance Ur Clear Clear   Leukocytes,UA Negative Negative   Protein,UA Negative Negative/Trace   Glucose, UA Negative Negative   Ketones, UA Negative Negative   RBC, UA Negative Negative   Bilirubin, UA Negative Negative   Urobilinogen, Ur 0.2 0.2 - 1.0 mg/dL   Nitrite, UA Negative Negative  HgB A1c  Result Value Ref Range   Hgb A1c MFr Bld 6.8 (H) 4.8 - 5.6 %   Est. average glucose Bld gHb Est-mCnc 148 mg/dL      Assessment & Plan:   Problem List Items Addressed This Visit      Cardiovascular and Mediastinum   Hypertension associated with diabetes ([place]) - Primary    BPs stable and under good control, continue present medications and home monitoring. DASH diet, exercise reviewed      Relevant Medications   benazepril (LOTENSIN) 40 MG tablet   colesevelam (WELCHOL) 625 MG tablet   glipiZIDE (GLUCOTROL) 5 MG tablet   sitaGLIPtin (JANUVIA) 100 MG tablet   lovastatin (MEVACOR) 40 MG tablet   metoprolol succinate (TOPROL-XL)  50 MG 24 hr tablet   Other Relevant Orders   Comprehensive metabolic panel     Endocrine   DM type 2 with diabetic peripheral neuropathy (HCC)    Recheck A1C, continue present medications, lifestyle modifications      Relevant Medications   benazepril (LOTENSIN) 40  MG tablet   glipiZIDE (GLUCOTROL) 5 MG tablet   sitaGLIPtin (JANUVIA) 100 MG tablet   lovastatin (MEVACOR) 40 MG tablet   pregabalin (LYRICA) 150 MG capsule   Other Relevant Orders   HgB A1c   Hyperlipidemia associated with type 2 diabetes mellitus (HCC)    Recheck lipids, adjust as needed. Diet and exercise      Relevant Medications   benazepril (LOTENSIN) 40 MG tablet   colesevelam (WELCHOL) 625 MG tablet   glipiZIDE (GLUCOTROL) 5 MG tablet   sitaGLIPtin (JANUVIA) 100 MG tablet   lovastatin (MEVACOR) 40 MG tablet   Other Relevant Orders   Lipid Panel w/o Chol/HDL Ratio     Genitourinary   CKD (chronic kidney disease) stage 3, GFR 30-59 ml/min    Recheck labs, stay well hydrated        Other   Back pain    Stable and well controlled on lyrica, prn tramadol. Continue cautious use of that, precautions reviewed. 60 tabs should last at minimum 6 months for her      Relevant Medications   traMADol (ULTRAM) 50 MG tablet    Other Visit Diagnoses    Essential hypertension       Relevant Medications   benazepril (LOTENSIN) 40 MG tablet   colesevelam (WELCHOL) 625 MG tablet   lovastatin (MEVACOR) 40 MG tablet   metoprolol succinate (TOPROL-XL) 50 MG 24 hr tablet   Hyperlipidemia, unspecified hyperlipidemia type       Relevant Medications   benazepril (LOTENSIN) 40 MG tablet   colesevelam (WELCHOL) 625 MG tablet   lovastatin (MEVACOR) 40 MG tablet   metoprolol succinate (TOPROL-XL) 50 MG 24 hr tablet   Hyperlipidemia       Relevant Medications   benazepril (LOTENSIN) 40 MG tablet   colesevelam (WELCHOL) 625 MG tablet   lovastatin (MEVACOR) 40 MG tablet   metoprolol succinate (TOPROL-XL) 50 MG 24 hr tablet       Follow up plan: Return in about 6 months (around 05/12/2020) for CPE.

## 2019-11-10 NOTE — Assessment & Plan Note (Signed)
Recheck lipids, adjust as needed. Diet and exercise. 

## 2019-11-11 LAB — COMPREHENSIVE METABOLIC PANEL
ALT: 41 IU/L — ABNORMAL HIGH (ref 0–32)
AST: 74 IU/L — ABNORMAL HIGH (ref 0–40)
Albumin/Globulin Ratio: 1.5 (ref 1.2–2.2)
Albumin: 4.3 g/dL (ref 3.7–4.7)
Alkaline Phosphatase: 58 IU/L (ref 48–121)
BUN/Creatinine Ratio: 11 — ABNORMAL LOW (ref 12–28)
BUN: 16 mg/dL (ref 8–27)
Bilirubin Total: 0.5 mg/dL (ref 0.0–1.2)
CO2: 24 mmol/L (ref 20–29)
Calcium: 9.3 mg/dL (ref 8.7–10.3)
Chloride: 101 mmol/L (ref 96–106)
Creatinine, Ser: 1.4 mg/dL — ABNORMAL HIGH (ref 0.57–1.00)
GFR calc Af Amer: 44 mL/min/{1.73_m2} — ABNORMAL LOW (ref >59)
GFR calc non Af Amer: 38 mL/min/{1.73_m2} — ABNORMAL LOW (ref >59)
Globulin, Total: 2.8 g/dL (ref 1.5–4.5)
Glucose: 130 mg/dL — ABNORMAL HIGH (ref 65–99)
Potassium: 4.7 mmol/L (ref 3.5–5.2)
Sodium: 140 mmol/L (ref 134–144)
Total Protein: 7.1 g/dL (ref 6.0–8.5)

## 2019-11-11 LAB — LIPID PANEL W/O CHOL/HDL RATIO
Cholesterol, Total: 146 mg/dL (ref 100–199)
HDL: 46 mg/dL (ref >39)
LDL Chol Calc (NIH): 73 mg/dL (ref 0–99)
Triglycerides: 159 mg/dL — ABNORMAL HIGH (ref 0–149)
VLDL Cholesterol Cal: 27 mg/dL (ref 5–40)

## 2019-11-11 LAB — HEMOGLOBIN A1C
Est. average glucose Bld gHb Est-mCnc: 166 mg/dL
Hgb A1c MFr Bld: 7.4 % — ABNORMAL HIGH (ref 4.8–5.6)

## 2019-11-14 ENCOUNTER — Other Ambulatory Visit: Payer: Self-pay | Admitting: Family Medicine

## 2019-12-12 ENCOUNTER — Other Ambulatory Visit: Payer: Self-pay | Admitting: Family Medicine

## 2019-12-12 DIAGNOSIS — E1142 Type 2 diabetes mellitus with diabetic polyneuropathy: Secondary | ICD-10-CM

## 2019-12-12 NOTE — Telephone Encounter (Signed)
Pt stated she would call the pharmacy. Pt verbalized and understood information.

## 2019-12-12 NOTE — Telephone Encounter (Signed)
Requested medication (s) are due for refill today:no  Requested medication (s) are on the active medication list: yes   Last refill:  09/13/2019  Future visit scheduled: yes   Notes to clinic: this refill cannot be delegated    Requested Prescriptions  Pending Prescriptions Disp Refills   pregabalin (LYRICA) 150 MG capsule [Pharmacy Med Name: PREGABALIN 150 MG CAPSULE] 270 capsule 0    Sig: Take 1 capsule (150 mg total) by mouth 3 (three) times daily.      Not Delegated - Neurology:  Anticonvulsants - Controlled Failed - 12/12/2019 12:22 PM      Failed - This refill cannot be delegated      Passed - Valid encounter within last 12 months    Recent Outpatient Visits           1 month ago Hypertension associated with diabetes [facility])   [facility], [place]   7 months ago Hypertension associated with diabetes [facility])   [facility] Merrie Roof [place], [place]   10 months ago Hypertension associated with diabetes [facility])   [facility] [place]ney [place], [place]   1 year ago Essential hypertension   [employer], Jeannette How, [place]   1 year ago [place]   [facility], Lilia [place], [place]       Future Appointments             In 5 months  [employer], [place]   In 5 months Cannady, Esbon T, NP [employer], [facility]              esomeprazole ([place]) 40 MG capsule [employer] Med Name: ESOMEPRAZOLE MAG DR 40 MG CAP] 90 capsule 1    Sig: TAKE 1 [place]      Gastroenterology: Proton Pump Inhibitors Passed - 12/12/2019 12:22 PM      Passed - Valid encounter within last 12 months    Recent Outpatient Visits           1 month ago Hypertension associated with diabetes [facility])   [facility] Volney American, [place]   7 months ago Hypertension associated with diabetes [facility])   [place], [place], [place]   10 months ago  Hypertension associated with diabetes [facility])   [facility] Volney American, [place]   1 year ago Essential hypertension   [place], Jeannette How, MD   1 year ago [place]   [facility], L[place], [place]       Future Appointments             In 5 months  [employer], [place]   In 5 months Cannady, Barbaraann Faster, NP [employer], [place]

## 2020-02-19 NOTE — Progress Notes (Signed)
[facility]  [address] [place], [place] [address] Phone: [phone]  Fax: [phone]   Clinic Day:  02/20/2020  Referring physician: Volney American,*  Chief Complaint: Anna Juarez is a 69 y.o. 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  HPI:   [facility] SPEP on 01/10/2018 revealed an abnormal protein band in the beta globulins, and a possible second abnormal protein band in the gamma globulins that may represent monoclonal immunoglobulins and/or light chains. UPEP was normal.  The patient was seen in consultation Dr. Rogue Bussing on 02/08/2018.  SPEP revealed an M spike of 0.2 g/dL IgA monoclonal protein with lambda light chain specificity.  IgA was 539 (87-352). Kappa free light chains were 21.8, lambda free light chain 34.9, and ratio 0.62. Follow up was planned for 6 months. The patient did not follow up because of the COVID-19 pandemic.  Bone survey on 02/10/2018 revealed no radiographic findings to suggest multiple myeloma.  The patient saw Dr. Holley Raring on 02/08/2020. Her chronic kidney disease appeared to be stable. She was referred back to hematology. Follow up was planned for 4 months.  Labs followed: 08/16/2018: Hematocrit 40.8, hemoglobin 13.5, platelets 163,000, WBC 6,800. Creatinine 1.56 (CrCl 33 ml/min). 05/15/2019: Hematocrit 39.0, hemoglobin 13.2, platelets 178,000, WBC 6,000. Creatinine 1.30 (CrCl 42 ml/min). 09/28/2019: Hematocrit 38.6, hemoglobin 12.6, platelets 161,000, WBC 6,000. Creatinine 1.38 (CrCl 38 ml/min). 02/01/2020: Hematocrit 40.2, hemoglobin 13.0, platelets 178,000, WBC 5,700. Creatinine 1.38 (CrCl 38 ml/min).  SPEP on 08/16/2018 revealed an M spike of 0.3 g/dL IgA monoclonal protein with lambda light chain specificity. Kappa free light chains were 26.6, lambda free light chain 42.3, and ratio 0.63.  SPEP on 05/18/2019 revealed a 0.5 g/dL abnormal  protein band in the beta globulins, and a possible second abnormal protein band in the gamma globulins that may represent monoclonal immunoglobulins and/or light chains.  SPEP on 09/28/2019 revealed a 0.6 g/dL restricted band (M-spike) migrating in the beta-2 globulin region. UPEP was normal.  Symptomatically, she feels "fairly good." She takes medication for acid reflux; she has not seen a GI physician in a while. She does not remember when her last colonoscopy was (no records in Epic or Care Everywhere). She feels that when she switches the direction she is walking too quickly, her hip gives out. Symptoms have been going on for over a year and has not been evaluated. She also has leg pain at night. She has had two knee replacements.  The patient denies fevers, sweats, headaches, changes in vision, runny nose, sore throat, cough, shortness of breath, chest pain, palpitations, nausea, vomiting, diarrhea, urinary symptoms, skin changes, numbness, weakness, balance or coordination problems, and bleeding of any kind.  She has never done a 24 hour urine collection.  Her brother had blood cancer. She had two brothers with kidney cancer.   Past Medical History:  Diagnosis Date  . Chronic kidney disease   . Diabetes mellitus without complication ([place])   . Extreme obesity   . Fatty liver   . Hyperlipidemia   . Hypertension   . Radiculopathy, cervical   . Renal insufficiency    pt states she has chronic kidney disease.     Past Surgical History:  Procedure Laterality Date  . ABDOMINAL HYSTERECTOMY    . JOINT REPLACEMENT    . SPINE SURGERY      Family History  Problem Relation Age of Onset  . Diabetes Mother   .  Heart disease Mother   . Hypertension Mother   . Heart disease Father   . Cancer Brother   . Hypertension Brother   . Breast cancer Maternal Aunt   . Breast cancer Paternal Aunt     Social History:  reports that she has never smoked. She has never used smokeless tobacco.  She reports that she does not drink alcohol and does not use drugs. She denies any alcohol or tobacco use. She denies any exposure to radiation or toxins. 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 She has 8 siblings. The patient is alone today.  Allergies:  Allergies  Allergen Reactions  . Actos [Pioglitazone]   . Metformin And Related Diarrhea  . Victoza [Liraglutide] Nausea And Vomiting    Current Medications: Current Outpatient Medications  Medication Sig Dispense Refill  . acetaminophen (TYLENOL) 500 MG tablet Take 500 mg by mouth as needed.    . benazepril (LOTENSIN) 40 MG tablet Take 1 tablet (40 mg total) by mouth daily. 90 tablet 1  . colesevelam (WELCHOL) 625 MG tablet Take 3 tablets (1,875 mg total) by mouth 2 (two) times daily. 540 tablet 1  . glipiZIDE (GLUCOTROL) 5 MG tablet Take 1 tablet (5 mg total) by mouth 2 (two) times daily before a meal. 180 tablet 1  . lovastatin (MEVACOR) 40 MG tablet TAKE 1 TABLET BY MOUTH EVERYDAY AT BEDTIME 90 tablet 1  . metoprolol succinate (TOPROL-XL) 50 MG 24 hr tablet Take 1 tablet (50 mg total) by mouth daily. Take with or immediately following a meal. 90 tablet 1  . NEXIUM 40 MG capsule TAKE 1 CAPSULE BY MOUTH EVERY DAY 90 capsule 1  . ONETOUCH ULTRA test strip USE 1 TO TEST BLOOD SUGAR DAILY AS NEEDED 100 strip 11  . pregabalin (LYRICA) 150 MG capsule Take 1 capsule (150 mg total) by mouth 3 (three) times daily. 270 capsule 1  . sitaGLIPtin (JANUVIA) 100 MG tablet Take 1 tablet (100 mg total) by mouth daily. 90 tablet 1  . traMADol (ULTRAM) 50 MG tablet Take 1 tablet (50 mg total) by mouth every 8 (eight) hours as needed. 30 tablet 1   No current facility-administered medications for this visit.    Review of Systems  Constitutional: Negative for chills, diaphoresis, fever, malaise/fatigue and weight loss.       Feels "fairly good".  HENT: Negative for congestion, ear discharge, ear pain,  hearing loss, nosebleeds, sinus pain, sore throat and tinnitus.   Eyes: Negative for blurred vision.  Respiratory: Negative for cough, hemoptysis, sputum production and shortness of breath.   Cardiovascular: Negative for chest pain, palpitations and leg swelling.  Gastrointestinal: Positive for heartburn (on medication). Negative for abdominal pain, blood in stool, constipation, diarrhea, melena, nausea and vomiting.       Last colonoscopy unknown.  Genitourinary: Negative for dysuria, frequency, hematuria and urgency.  Musculoskeletal: Positive for myalgias (leg pain at night). Negative for back pain, joint pain and neck pain.       Hip "gives out."  Skin: Negative for itching and rash.  Neurological: Negative for dizziness, tingling, sensory change, weakness and headaches.  Endo/Heme/Allergies: Does not bruise/bleed easily.  Psychiatric/Behavioral: Negative for depression and memory loss. The patient is not nervous/anxious and does not have insomnia.   All other systems reviewed and are negative.  Performance status (ECOG): 0-1  Vitals Blood pressure (!) 145/70, pulse 69, temperature (!) 97.3 F (36.3 C), temperature source Tympanic,  weight (!) 325 lb 6.4 oz (147.6 kg), SpO2 97 %.   Physical Exam Vitals and nursing note reviewed.  Constitutional:      General: She is not in acute distress.    Appearance: She is not diaphoretic.  HENT:     Head: Normocephalic and atraumatic.     Comments: Short gray hair.    Mouth/Throat:     Mouth: Mucous membranes are moist.     Pharynx: Oropharynx is clear.  Eyes:     General: No scleral icterus.    Extraocular Movements: Extraocular movements intact.     Conjunctiva/sclera: Conjunctivae normal.     Pupils: Pupils are equal, round, and reactive to light.     Comments: Glasses.  Blue eyes.  Cardiovascular:     Rate and Rhythm: Normal rate and regular rhythm.     Heart sounds: Normal heart sounds. No murmur heard.   Pulmonary:     Effort:  Pulmonary effort is normal. No respiratory distress.     Breath sounds: Normal breath sounds. No wheezing or rales.  Chest:     Chest wall: No tenderness.  Abdominal:     General: Bowel sounds are normal. There is no distension.     Palpations: Abdomen is soft. There is no mass.     Tenderness: There is no abdominal tenderness. There is no guarding or rebound.  Musculoskeletal:        General: No swelling or tenderness. Normal range of motion.     Cervical back: Normal range of motion and neck supple.  Lymphadenopathy:     Head:     Right side of head: No preauricular, posterior auricular or occipital adenopathy.     Left side of head: No preauricular, posterior auricular or occipital adenopathy.     Cervical: No cervical adenopathy.     Upper Body:     Right upper body: No supraclavicular or axillary adenopathy.     Left upper body: No supraclavicular or axillary adenopathy.     Lower Body: No right inguinal adenopathy. No left inguinal adenopathy.  Skin:    General: Skin is warm and dry.  Neurological:     Mental Status: She is alert and oriented to person, place, and time.  Psychiatric:        Behavior: Behavior normal.        Thought Content: Thought content normal.        Judgment: Judgment normal.    No visits with results within 3 Day(s) from this visit.  Latest known visit with results is:  Office Visit on 11/10/2019  Component Date Value Ref Range Status  . Glucose 11/10/2019 130* 65 - 99 mg/dL Final  . BUN 11/10/2019 16  8 - 27 mg/dL Final  . Creatinine, Ser 11/10/2019 1.40* 0.57 - 1.00 mg/dL Final  . GFR calc non Af Amer 11/10/2019 38* >59 mL/min/1.73 Final  . GFR calc Af Amer 11/10/2019 44* >59 mL/min/1.73 Final   Comment: **[facility] currently reports eGFR in compliance with the current**   recommendations of the [employer]. [facility] will   update reporting as new guidelines are published from the NKF-ASN   Task force.   . BUN/Creatinine Ratio  11/10/2019 11* 12 - 28 Final  . Sodium 11/10/2019 140  134 - 144 mmol/L Final  . Potassium 11/10/2019 4.7  3.5 - 5.2 mmol/L Final  . Chloride 11/10/2019 101  96 - 106 mmol/L Final  . CO2 11/10/2019 24  20 - 29 mmol/L Final  .  Calcium 11/10/2019 9.3  8.7 - 10.3 mg/dL Final  . Total Protein 11/10/2019 7.1  6.0 - 8.5 g/dL Final  . Albumin 11/10/2019 4.3  3.7 - 4.7 g/dL Final  . Globulin, Total 11/10/2019 2.8  1.5 - 4.5 g/dL Final  . Albumin/Globulin Ratio 11/10/2019 1.5  1.2 - 2.2 Final  . Bilirubin Total 11/10/2019 0.5  0.0 - 1.2 mg/dL Final  . Alkaline Phosphatase 11/10/2019 58  48 - 121 IU/L Final  . AST 11/10/2019 74* 0 - 40 IU/L Final  . ALT 11/10/2019 41* 0 - 32 IU/L Final  . Cholesterol, Total 11/10/2019 146  100 - 199 mg/dL Final  . Triglycerides 11/10/2019 159* 0 - 149 mg/dL Final  . HDL 11/10/2019 46  >39 mg/dL Final  . VLDL Cholesterol Cal 11/10/2019 27  5 - 40 mg/dL Final  . LDL Chol Calc (NIH) 11/10/2019 73  0 - 99 mg/dL Final  . Hgb A1c MFr Bld 11/10/2019 7.4* 4.8 - 5.6 % Final   Comment:          Prediabetes: 5.7 - 6.4          Diabetes: >6.4          Glycemic control for adults with diabetes: <7.0   . Est. average glucose Bld gHb Est-m* 11/10/2019 166  mg/dL Final    Assessment:  Anvita Gianny Killman is a 69 y.o. 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  M-spike has been followed (g/dL):  0.2 on 02/08/2018, 0.3 on 08/16/2018, 0.5 on 05/18/2019, and 0.6 on 09/28/2019.  IgA was 539 (87-352).  Light chains have been monitored:  34.9 (ratio 0.62) on 02/08/2018, 42.3 (ratio 0.63) on 08/16/2018.  Bone survey on 02/10/2018 revealed no radiographic findings to suggest multiple myeloma.  She has stage 3b chronic kidney disease.  Renal function is stable.  She is unaware of her last colonoscopy.  Symptomatically, she feels "fairly good."  Her hip gives out at times.   Exam is unremarkable.  Plan: 1.   Labs  today:  CBC with diff, CMP, myeloma panel, FLCA, LDH, beta2-microglobulin. 2.   24 hour urine for UPEP and free light chains. 3.   IgA monoclonal gammopathy of unknown significance  Review entire medical history, diagnosis and management of MGUS.  Discuss labs and 24 hour urine.  Discuss CRAB criteria.  Risk of transformation to a lymphoplasmacytic disorder such as myeloma is 1%/year.  Discuss surveillance every 6 months. 4.   RTC in 2 weeks for MD assess (telephone), review of work-up and discussion regarding direction of therapy.  I discussed the assessment and treatment plan with the patient.  The patient was provided an opportunity to ask questions and all were answered.  The patient agreed with the plan and demonstrated an understanding of the instructions.  The patient was advised to call back if the symptoms worsen or if the condition fails to improve as anticipated.   Sparkles Mcneely C. [name], MD, PhD    02/20/2020, 3:38 PM  I, Mirian Mo Tufford, am acting as Education administrator for [employer]. [name], MD, PhD.  I, Juanito Gonyer C. [name], MD, have reviewed the above documentation for accuracy and completeness, and I agree with the above.

## 2020-02-20 ENCOUNTER — Telehealth: Payer: Self-pay

## 2020-02-20 ENCOUNTER — Other Ambulatory Visit: Payer: Self-pay

## 2020-02-20 ENCOUNTER — Encounter: Payer: Self-pay | Admitting: Hematology and Oncology

## 2020-02-20 ENCOUNTER — Inpatient Hospital Stay: Payer: Medicare PPO

## 2020-02-20 ENCOUNTER — Inpatient Hospital Stay: Payer: Medicare PPO | Attending: Hematology and Oncology | Admitting: Hematology and Oncology

## 2020-02-20 VITALS — BP 145/70 | HR 69 | Temp 97.3°F | Wt 325.4 lb

## 2020-02-20 DIAGNOSIS — D472 Monoclonal gammopathy: Secondary | ICD-10-CM

## 2020-02-20 DIAGNOSIS — N1832 Chronic kidney disease, stage 3b: Secondary | ICD-10-CM | POA: Diagnosis not present

## 2020-02-20 LAB — CBC WITH DIFFERENTIAL/PLATELET
Abs Immature Granulocytes: 0.04 10*3/uL (ref 0.00–0.07)
Basophils Absolute: 0 10*3/uL (ref 0.0–0.1)
Basophils Relative: 0 %
Eosinophils Absolute: 0.1 10*3/uL (ref 0.0–0.5)
Eosinophils Relative: 2 %
HCT: 38.9 % (ref 36.0–46.0)
Hemoglobin: 12.9 g/dL (ref 12.0–15.0)
Immature Granulocytes: 1 %
Lymphocytes Relative: 26 %
Lymphs Abs: 1.4 10*3/uL (ref 0.7–4.0)
MCH: 30.7 pg (ref 26.0–34.0)
MCHC: 33.2 g/dL (ref 30.0–36.0)
MCV: 92.6 fL (ref 80.0–100.0)
Monocytes Absolute: 0.4 10*3/uL (ref 0.1–1.0)
Monocytes Relative: 8 %
Neutro Abs: 3.4 10*3/uL (ref 1.7–7.7)
Neutrophils Relative %: 63 %
Platelets: 162 10*3/uL (ref 150–400)
RBC: 4.2 MIL/uL (ref 3.87–5.11)
RDW: 13.4 % (ref 11.5–15.5)
WBC: 5.4 10*3/uL (ref 4.0–10.5)
nRBC: 0 % (ref 0.0–0.2)

## 2020-02-20 LAB — COMPREHENSIVE METABOLIC PANEL
ALT: 31 U/L (ref 0–44)
AST: 72 U/L — ABNORMAL HIGH (ref 15–41)
Albumin: 4 g/dL (ref 3.5–5.0)
Alkaline Phosphatase: 49 U/L (ref 38–126)
Anion gap: 12 (ref 5–15)
BUN: 14 mg/dL (ref 8–23)
CO2: 26 mmol/L (ref 22–32)
Calcium: 8.9 mg/dL (ref 8.9–10.3)
Chloride: 99 mmol/L (ref 98–111)
Creatinine, Ser: 1.34 mg/dL — ABNORMAL HIGH (ref 0.44–1.00)
GFR, Estimated: 42 mL/min — ABNORMAL LOW (ref >60)
Glucose, Bld: 160 mg/dL — ABNORMAL HIGH (ref 70–99)
Potassium: 3.9 mmol/L (ref 3.5–5.1)
Sodium: 137 mmol/L (ref 135–145)
Total Bilirubin: 0.6 mg/dL (ref 0.3–1.2)
Total Protein: 7.8 g/dL (ref 6.5–8.1)

## 2020-02-20 LAB — LACTATE DEHYDROGENASE: LDH: 222 U/L — ABNORMAL HIGH (ref 98–192)

## 2020-02-20 NOTE — Telephone Encounter (Signed)
Labs sent to dr Holley Raring

## 2020-02-21 LAB — MULTIPLE MYELOMA PANEL, SERUM
Albumin SerPl Elph-Mcnc: 3.7 g/dL (ref 2.9–4.4)
Albumin/Glob SerPl: 1.1 (ref 0.7–1.7)
Alpha 1: 0.3 g/dL (ref 0.0–0.4)
Alpha2 Glob SerPl Elph-Mcnc: 0.9 g/dL (ref 0.4–1.0)
B-Globulin SerPl Elph-Mcnc: 1.5 g/dL — ABNORMAL HIGH (ref 0.7–1.3)
Gamma Glob SerPl Elph-Mcnc: 0.8 g/dL (ref 0.4–1.8)
Globulin, Total: 3.4 g/dL (ref 2.2–3.9)
IgA: 532 mg/dL — ABNORMAL HIGH (ref 64–422)
IgG (Immunoglobin G), Serum: 930 mg/dL (ref 586–1602)
IgM (Immunoglobulin M), Srm: 61 mg/dL (ref 26–217)
M Protein SerPl Elph-Mcnc: 0.3 g/dL — ABNORMAL HIGH
Total Protein ELP: 7.1 g/dL (ref 6.0–8.5)

## 2020-02-21 LAB — KAPPA/LAMBDA LIGHT CHAINS
Kappa free light chain: 23.8 mg/L — ABNORMAL HIGH (ref 3.3–19.4)
Kappa, lambda light chain ratio: 0.39 (ref 0.26–1.65)
Lambda free light chains: 61.5 mg/L — ABNORMAL HIGH (ref 5.7–26.3)

## 2020-02-21 LAB — BETA 2 MICROGLOBULIN, SERUM: Beta-2 Microglobulin: 3.1 mg/L — ABNORMAL HIGH (ref 0.6–2.4)

## 2020-03-04 NOTE — Progress Notes (Signed)
[facility]  [address], [address] [place], [place] [address] Phone: [phone]  Fax: [phone]   Telephone Visit:  03/05/2020  Referring physician: Volney American,*  I connected with [name] on 03/05/2020 at 4:30 PM by telephone and verified that I was speaking with the correct person using 2 identifiers.  The patient was at home.  I discussed the limitations, risk, security and privacy concerns of performing an evaluation and management service by videoconferencing and the availability of in person appointments.  I also discussed with the patient that there may be a patient responsible charge related to this service.  The patient expressed understanding and agreed to proceed.   Chief Complaint: Anna Juarez is a 69 y.o. female with MGUS and stage 3b chronic kidney disease who is seen for review of work-up and discussion regarding direction of therapy.  HPI:  The patient was last seen in the hematology clinic on 02/20/2020 for new patient assessment. At that time, she felt "fairly good."  Her hip gave out at times. Exam was unremarkable.  Work-up revealed a hematocrit of 38.9, hemoglobin 12.9, platelets 162,000, WBC 5,400.  Creatinine was 1.34 (CrCl 42 ml/min). AST was 72. LDH was 222 (98-192). Beta-2 microglobulin was 3.1 (0.6-2.4). M spike was 0.3 gm/dL with lambda light chain specificity. Kappa free light chains were 23.8, lambda free light chains were 61.5, and ratio 0.39 (normal).  24 hour UPEP and free light chains are pending.  During the interim, she has been fine. She denies any new symptoms. She is planning to bring her 24-hour urine in this Thursday.   Past Medical History:  Diagnosis Date  . Chronic kidney disease   . Diabetes mellitus without complication ([place])   . Extreme obesity   . Fatty liver   . Hyperlipidemia   . Hypertension   . Radiculopathy, cervical   . Renal insufficiency    pt states she has chronic  kidney disease.     Past Surgical History:  Procedure Laterality Date  . ABDOMINAL HYSTERECTOMY    . JOINT REPLACEMENT    . SPINE SURGERY      Family History  Problem Relation Age of Onset  . Diabetes Mother   . Heart disease Mother   . Hypertension Mother   . Heart disease Father   . Cancer Brother   . Hypertension Brother   . Breast cancer Maternal Aunt   . Breast cancer Paternal Aunt     Social History:  reports that she has never smoked. She has never used smokeless tobacco. She reports that she does not drink alcohol and does not use drugs. She denies any alcohol or tobacco use. She denies any exposure to radiation or toxins. 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 She has 8 siblings. The patient is alone today.  Participants in the patient's visit and their role in the encounter included the patient and Orlene Och, RN or Vito Berger, CMA, today.  The intake visit was provided by Orlene Och, RN.  Allergies:  Allergies  Allergen Reactions  . Actos [Pioglitazone]   . Metformin And Related Diarrhea  . Victoza [Liraglutide] Nausea And Vomiting    Current Medications: Current Outpatient Medications  Medication Sig Dispense Refill  . acetaminophen (TYLENOL) 500 MG tablet Take 500 mg by mouth as needed.    . benazepril (LOTENSIN) 40 MG tablet Take 1 tablet (40 mg total) by mouth daily.  90 tablet 1  . colesevelam (WELCHOL) 625 MG tablet Take 3 tablets (1,875 mg total) by mouth 2 (two) times daily. 540 tablet 1  . glipiZIDE (GLUCOTROL) 5 MG tablet Take 1 tablet (5 mg total) by mouth 2 (two) times daily before a meal. 180 tablet 1  . lovastatin (MEVACOR) 40 MG tablet TAKE 1 TABLET BY MOUTH EVERYDAY AT BEDTIME 90 tablet 1  . metoprolol succinate (TOPROL-XL) 50 MG 24 hr tablet Take 1 tablet (50 mg total) by mouth daily. Take with or immediately following a meal. 90 tablet 1  . NEXIUM 40 MG capsule TAKE 1 CAPSULE BY  MOUTH EVERY DAY 90 capsule 1  . ONETOUCH ULTRA test strip USE 1 TO TEST BLOOD SUGAR DAILY AS NEEDED 100 strip 11  . pregabalin (LYRICA) 150 MG capsule Take 1 capsule (150 mg total) by mouth 3 (three) times daily. 270 capsule 1  . sitaGLIPtin (JANUVIA) 100 MG tablet Take 1 tablet (100 mg total) by mouth daily. 90 tablet 1  . traMADol (ULTRAM) 50 MG tablet Take 1 tablet (50 mg total) by mouth every 8 (eight) hours as needed. 30 tablet 1   No current facility-administered medications for this visit.    Review of Systems  Constitutional: Negative for chills, diaphoresis, fever, malaise/fatigue and weight loss.  HENT: Negative for congestion, ear discharge, ear pain, hearing loss, nosebleeds, sinus pain, sore throat and tinnitus.   Eyes: Negative for blurred vision.  Respiratory: Negative for cough, hemoptysis, sputum production and shortness of breath.   Cardiovascular: Negative for chest pain, palpitations and leg swelling.  Gastrointestinal: Positive for heartburn (on medication). Negative for abdominal pain, blood in stool, constipation, diarrhea, melena, nausea and vomiting.       Last colonoscopy unknown.  Genitourinary: Negative for dysuria, frequency, hematuria and urgency.  Musculoskeletal: Positive for myalgias (leg pain at night). Negative for back pain, joint pain and neck pain.       Hip "gives out."  Skin: Negative for itching and rash.  Neurological: Negative for dizziness, tingling, sensory change, weakness and headaches.  Endo/Heme/Allergies: Does not bruise/bleed easily.  Psychiatric/Behavioral: Negative for depression and memory loss. The patient is not nervous/anxious and does not have insomnia.   All other systems reviewed and are negative.  Performance status (ECOG): 0  Vitals There were no vitals taken for this visit.   No visits with results within 3 Day(s) from this visit.  Latest known visit with results is:  Office Visit on 02/20/2020  Component Date Value Ref  Range Status  . LDH 02/20/2020 222* 98 - 192 U/L Final   Performed at [facility], [address] [place], [place] [address]  . Beta-2 Microglobulin 02/20/2020 3.1* 0.6 - 2.4 mg/L Final   Comment: (NOTE) Siemens Immulite 2000 Immunochemiluminometric assay (ICMA) Values obtained with different assay methods or kits cannot be used interchangeably. Results cannot be interpreted as absolute evidence of the presence or absence of malignant disease. Performed At: [facility] [place], [place] [id number] Rush Farmer MD AS:[id number]   . Kappa free light chain 02/20/2020 23.8* 3.3 - 19.4 mg/L Final  . Lamda free light chains 02/20/2020 61.5* 5.7 - 26.3 mg/L Final  . Kappa, lamda light chain ratio 02/20/2020 0.39  0.26 - 1.65 Final   Comment: (NOTE) Performed At: [facility] [place], [place] [id number] Rush Farmer MD XB:[id number]   . IgG (Immunoglobin G), Serum 02/20/2020 930  586 - 1,602 mg/dL Final  . IgA  02/20/2020 532* 64 - 422 mg/dL Final  . IgM (Immunoglobulin M), Srm 02/20/2020 61  26 - 217 mg/dL Final  . Total Protein ELP 02/20/2020 7.1  6.0 - 8.5 g/dL Corrected  . Albumin SerPl Elph-Mcnc 02/20/2020 3.7  2.9 - 4.4 g/dL Corrected  . Alpha 1 02/20/2020 0.3  0.0 - 0.4 g/dL Corrected  . Alpha2 Glob SerPl Elph-Mcnc 02/20/2020 0.9  0.4 - 1.0 g/dL Corrected  . B-Globulin SerPl Elph-Mcnc 02/20/2020 1.5* 0.7 - 1.3 g/dL Corrected  . Gamma Glob SerPl Elph-Mcnc 02/20/2020 0.8  0.4 - 1.8 g/dL Corrected  . M Protein SerPl Elph-Mcnc 02/20/2020 0.3* Not Observed g/dL Corrected  . Globulin, Total 02/20/2020 3.4  2.2 - 3.9 g/dL Corrected  . Albumin/Glob SerPl 02/20/2020 1.1  0.7 - 1.7 Corrected  . IFE 1 02/20/2020 Comment*  Corrected   Comment: (NOTE) 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   . Please Note 02/20/2020 Comment   Corrected   Comment: (NOTE) Protein electrophoresis scan will  follow via computer, mail, or courier delivery. Performed At: [facility] [place], [place] [id number] Rush Farmer MD PI:[id number]   . Sodium 02/20/2020 137  135 - 145 mmol/L Final  . Potassium 02/20/2020 3.9  3.5 - 5.1 mmol/L Final  . Chloride 02/20/2020 99  98 - 111 mmol/L Final  . CO2 02/20/2020 26  22 - 32 mmol/L Final  . Glucose, Bld 02/20/2020 160* 70 - 99 mg/dL Final   Glucose reference range applies only to samples taken after fasting for at least 8 hours.  . BUN 02/20/2020 14  8 - 23 mg/dL Final  . Creatinine, Ser 02/20/2020 1.34* 0.44 - 1.00 mg/dL Final  . Calcium 02/20/2020 8.9  8.9 - 10.3 mg/dL Final  . Total Protein 02/20/2020 7.8  6.5 - 8.1 g/dL Final  . Albumin 02/20/2020 4.0  3.5 - 5.0 g/dL Final  . AST 02/20/2020 72* 15 - 41 U/L Final  . ALT 02/20/2020 31  0 - 44 U/L Final  . Alkaline Phosphatase 02/20/2020 49  38 - 126 U/L Final  . Total Bilirubin 02/20/2020 0.6  0.3 - 1.2 mg/dL Final  . GFR, Estimated 02/20/2020 42* >60 mL/min Final   Comment: (NOTE) Calculated using the CKD-EPI Creatinine Equation (2021)   . Anion gap 02/20/2020 12  5 - 15 Final   Performed at [facility], [address], [place], [place] [address]  . WBC 02/20/2020 5.4  4.0 - 10.5 K/uL Final  . RBC 02/20/2020 4.20  3.87 - 5.11 MIL/uL Final  . Hemoglobin 02/20/2020 12.9  12.0 - 15.0 g/dL Final  . HCT 02/20/2020 38.9  36 - 46 % Final  . MCV 02/20/2020 92.6  80.0 - 100.0 fL Final  . MCH 02/20/2020 30.7  26.0 - 34.0 pg Final  . MCHC 02/20/2020 33.2  30.0 - 36.0 g/dL Final  . RDW 02/20/2020 13.4  11.5 - 15.5 % Final  . Platelets 02/20/2020 162  150 - 400 K/uL Final  . nRBC 02/20/2020 0.0  0.0 - 0.2 % Final  . Neutrophils Relative % 02/20/2020 63  % Final  . Neutro Abs 02/20/2020 3.4  1.7 - 7.7 K/uL Final  . Lymphocytes Relative 02/20/2020 26  % Final  . Lymphs Abs 02/20/2020 1.4  0.7 - 4.0 K/uL Final  . Monocytes Relative 02/20/2020 8  % Final  .  Monocytes Absolute 02/20/2020 0.4  0.1 - 1.0 K/uL Final  . Eosinophils Relative 02/20/2020 2  %  Final  . Eosinophils Absolute 02/20/2020 0.1  0.0 - 0.5 K/uL Final  . Basophils Relative 02/20/2020 0  % Final  . Basophils Absolute 02/20/2020 0.0  0.0 - 0.1 K/uL Final  . Immature Granulocytes 02/20/2020 1  % Final  . Abs Immature Granulocytes 02/20/2020 0.04  0.00 - 0.07 K/uL Final   Performed at [facility], [address], [place], [place] [address]    Assessment:  Anna Juarez is a 69 y.o. 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  M-spike has been followed (g/dL):  0.2 on 02/08/2018, 0.3 on 08/16/2018, 0.5 on 05/18/2019, 0.6 on 09/28/2019, and 0.3 on 02/20/2020.  IgA was 539 (87-352) on 02/08/2018 and 532 on 02/20/2020.  Lambda free light chains have been monitored:  34.9 (ratio 0.62) on 02/08/2018, 42.3 (ratio 0.63) on 08/16/2018, and 61.5 (ratio 0.39) on 02/20/2020.  Bone survey on 02/10/2018 revealed no radiographic findings to suggest multiple myeloma.  She has stage 3b chronic kidney disease.  Renal function is stable.  She is unaware of her last colonoscopy.  Symptomatically, she feels "good".  She denies any B symptoms.  She denies any bone pain or recurrent infections.  Plan: 1.   Review work-up. 2.   IgA monoclonal gammopathy of unknown significance  M-spike is 0.3 gm/dL.  Free light chain ratio is normal.  Await 24 hour urine for UPEP and free light chains.  Discuss CRAB criteria.   No hypercalcemia, anemia or bone lesions.   Patient has stable mild chronic renal insufficiency.  Discuss ongoing monitoring every 6 months. 3.   RTC in 6 months for MD assessment and labs (CBC with diff, CMP, SPEP, FLCA).  Addendum:  24 hour UPEP revealed no monoclonal protein.  Kappa free light chains, lambda free light chains and ratio were normal.  I discussed the assessment and treatment plan with the  patient.  The patient was provided an opportunity to ask questions and all were answered.  The patient agreed with the plan and demonstrated an understanding of the instructions.  The patient was advised to call back if the symptoms worsen or if the condition fails to improve as anticipated.  I provided 7 minutes (4:30 PM - 4:36 PM) of non face-to-face time during this encounter and > 50% was spent counseling as documented under my assessment and plan.  I provided these services from the [facility] office.   Melissa C. [name], MD, PhD    03/05/2020, 4:30 PM  I, Mirian Mo Tufford, am acting as Education administrator for [employer]. [name], MD, PhD.  I, Melissa C. [name], MD, have reviewed the above documentation for accuracy and completeness, and I agree with the above.

## 2020-03-05 ENCOUNTER — Encounter: Payer: Self-pay | Admitting: Hematology and Oncology

## 2020-03-05 ENCOUNTER — Inpatient Hospital Stay (HOSPITAL_BASED_OUTPATIENT_CLINIC_OR_DEPARTMENT_OTHER): Payer: Medicare PPO | Admitting: Hematology and Oncology

## 2020-03-05 DIAGNOSIS — N183 Chronic kidney disease, stage 3 unspecified: Secondary | ICD-10-CM | POA: Diagnosis not present

## 2020-03-05 DIAGNOSIS — D472 Monoclonal gammopathy: Secondary | ICD-10-CM

## 2020-03-07 ENCOUNTER — Other Ambulatory Visit: Payer: Self-pay

## 2020-03-07 DIAGNOSIS — D472 Monoclonal gammopathy: Secondary | ICD-10-CM

## 2020-03-07 DIAGNOSIS — E785 Hyperlipidemia, unspecified: Secondary | ICD-10-CM

## 2020-03-07 DIAGNOSIS — I10 Essential (primary) hypertension: Secondary | ICD-10-CM

## 2020-03-07 MED ORDER — BENAZEPRIL HCL 40 MG PO TABS: 40.0000 mg | ORAL_TABLET | Freq: Every day | ORAL | 1 refills | Status: DC

## 2020-03-07 MED ORDER — ONETOUCH ULTRA VI STRP: 1.0000 | ORAL_STRIP | 11 refills | Status: DC | PRN

## 2020-03-07 MED ORDER — LOVASTATIN 40 MG PO TABS: ORAL_TABLET | ORAL | 1 refills | Status: DC

## 2020-03-07 MED ORDER — METOPROLOL SUCCINATE ER 50 MG PO TB24: 50.0000 mg | ORAL_TABLET | Freq: Every day | ORAL | 1 refills | Status: DC

## 2020-03-07 MED ORDER — GLIPIZIDE 5 MG PO TABS
5.0000 mg | ORAL_TABLET | Freq: Two times a day (BID) | ORAL | 1 refills | Status: DC
Start: 2020-03-07 — End: 2020-08-06

## 2020-03-07 NOTE — Telephone Encounter (Signed)
Previous RL patient. Needs these prescriptions sent to  new pharmacy, [facility].

## 2020-03-08 LAB — FREE K+L LT CHAINS,QN,UR
Free Kappa Lt Chains,Ur: 27.37 mg/L (ref 0.63–113.79)
Free Kappa/Lambda Ratio: 6.91 (ref 1.03–31.76)
Free Lambda Lt Chains,Ur: 3.96 mg/L (ref 0.47–11.77)
Total Volume: 600

## 2020-03-08 IMAGING — CT CT HEAD W/O CM
2 series · 15 of 30 positions shown, 17 images · non-contrast
Comparison: 05/11/2004

CLINICAL DATA: Sharp pain RIGHT-side of head for over 1 month

EXAM:
CT HEAD WITHOUT CONTRAST
TECHNIQUE: Contiguous axial images were obtained from the base of the skull
through the vertex without intravenous contrast. Sagittal and
coronal MPR images reconstructed from axial data set.

[Series 2: head wo · axial · 0.44mm/px · z∈[-65,+45]mm · 7 of 30 slices shown, 9 images]
[im 4/30  brain]
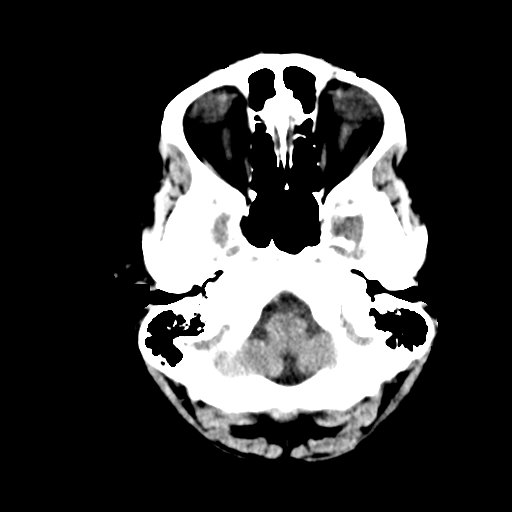
[im 4/30  bone]
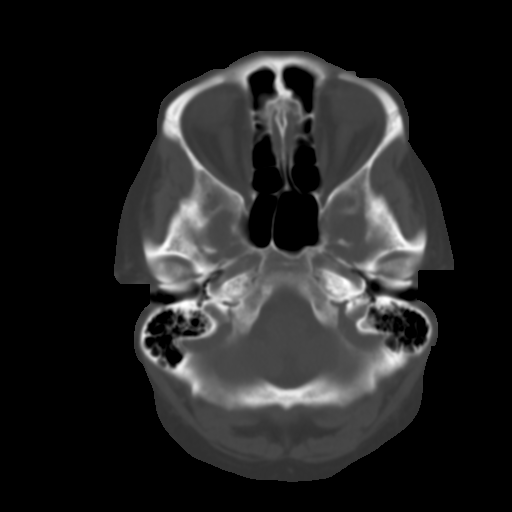
[im 8/30  brain]
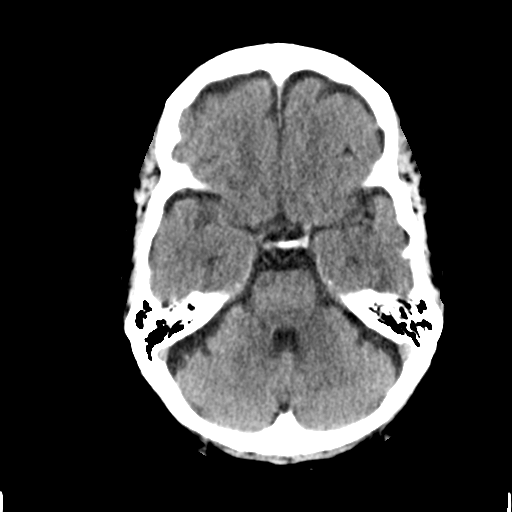
[im 11/30  brain]
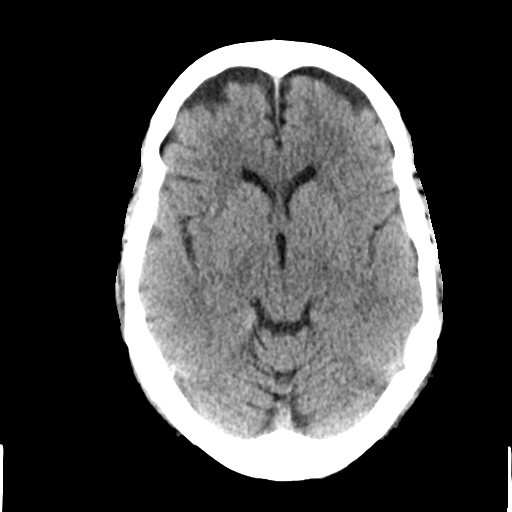
[im 15/30  brain]
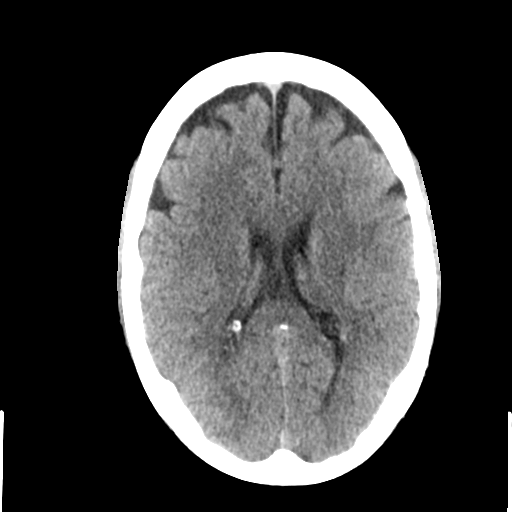
[im 19/30  brain]
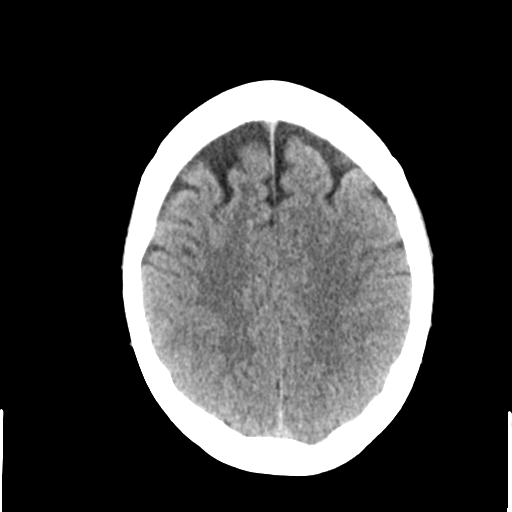
[im 19/30  bone]
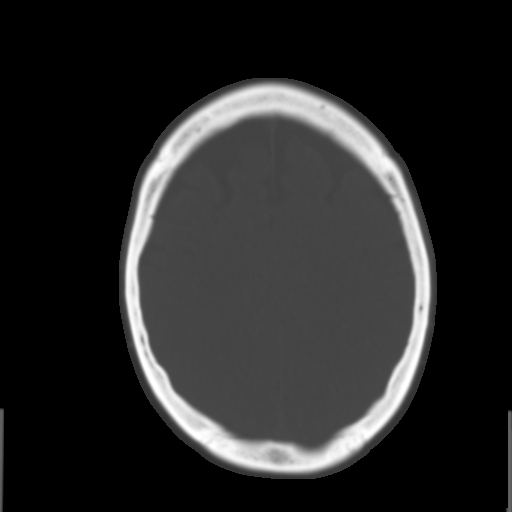
[im 22/30  brain]
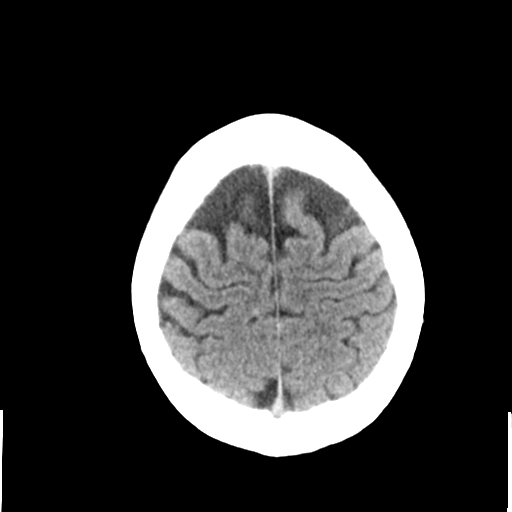
[im 26/30  brain]
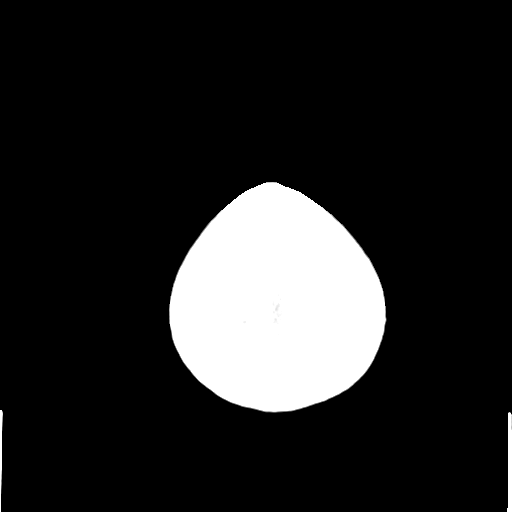

[Series 3: head bone · axial · 0.44mm/px · z∈[-66,+48]mm · 8 of 73 slices shown]
[im 8/73  bone]
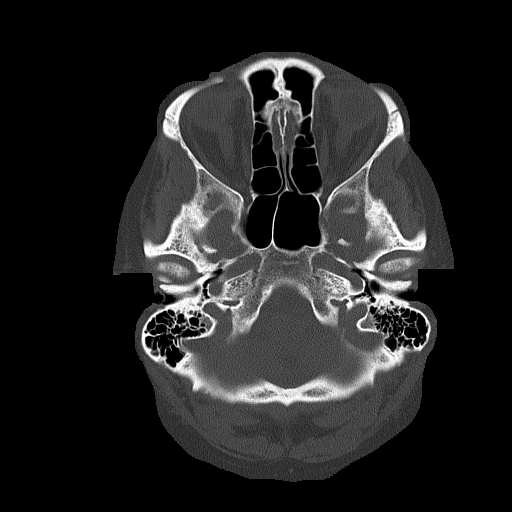
[im 15/73  bone]
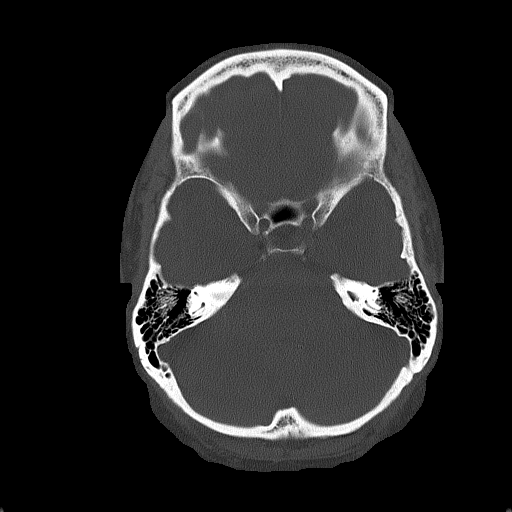
[im 22/73  bone]
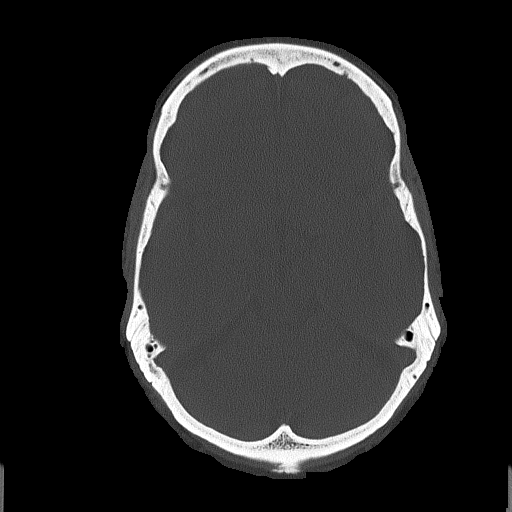
[im 33/73  bone]
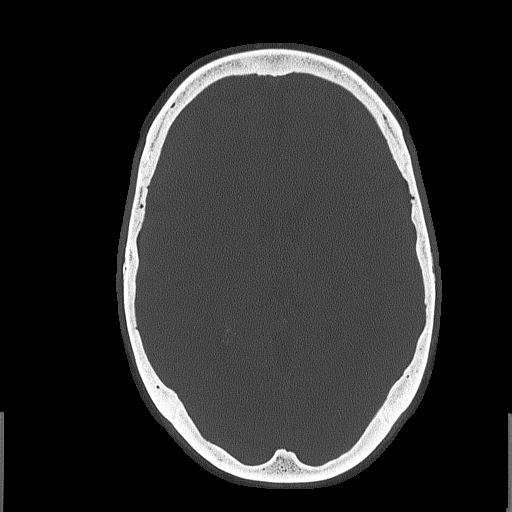
[im 40/73  bone]
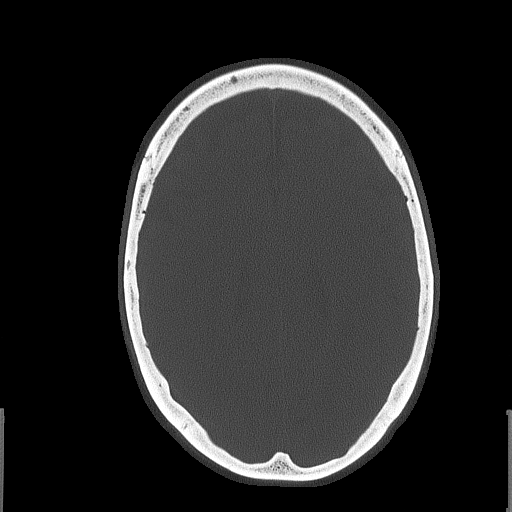
[im 51/73  bone]
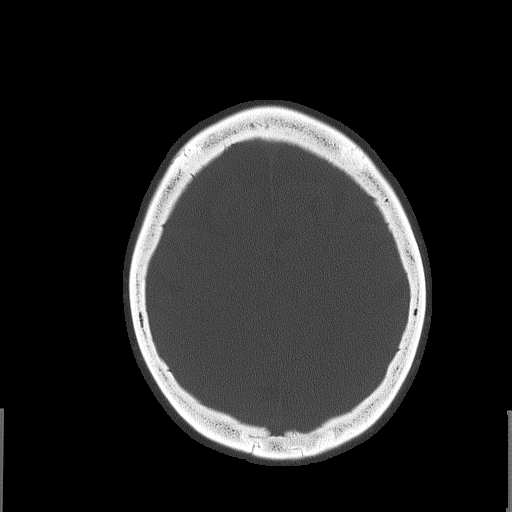
[im 58/73  bone]
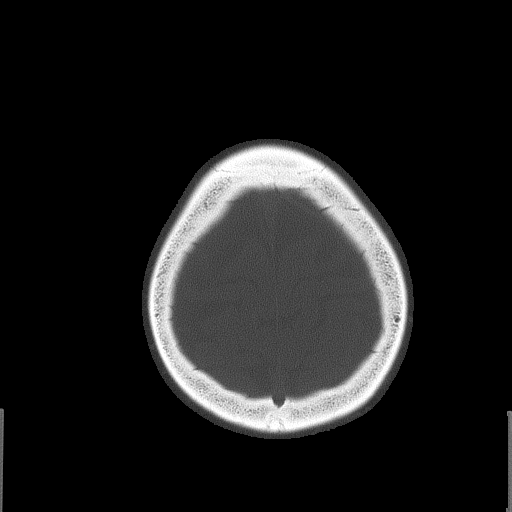
[im 65/73  bone]
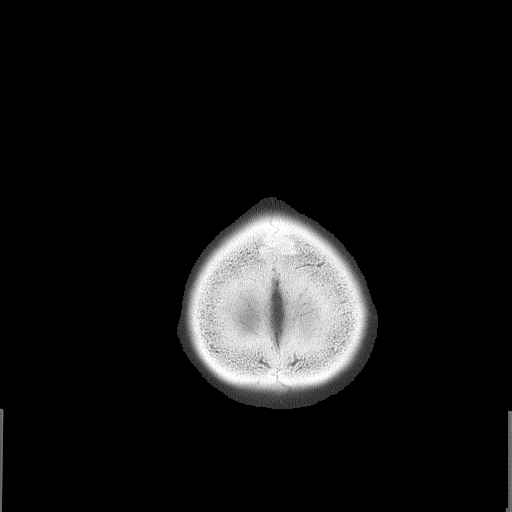

[15 of 30 positions shown; findings below may reference images not displayed]

FINDINGS: Brain: Mild generalized atrophy. Normal ventricular morphology. No
midline shift or mass effect. Normal appearance of brain parenchyma.
No intracranial hemorrhage, mass lesion or evidence acute
infarction. No extra-axial fluid collections.

Vascular: Minimal atherosclerotic calcification within LEFT internal
carotid artery at skull base

Skull: Intact

Sinuses/Orbits: Clear

Other: N/A
IMPRESSION: No acute intracranial abnormalities.

## 2020-03-12 LAB — UPEP/TP, 24-HR URINE
Albumin, U: 100 %
Alpha 1, Urine: 0 %
Alpha 2, Urine: 0 %
Beta, Urine: 0 %
Gamma Globulin, Urine: 0 %
Total Protein, Urine-Ur/day: 61 mg/24 hr (ref 30–150)
Total Protein, Urine: 10.1 mg/dL
Total Volume: 600

## 2020-04-02 ENCOUNTER — Other Ambulatory Visit: Payer: Self-pay

## 2020-04-02 ENCOUNTER — Encounter: Payer: Self-pay | Admitting: Family Medicine

## 2020-04-02 ENCOUNTER — Ambulatory Visit: Payer: Medicare PPO | Admitting: Family Medicine

## 2020-04-02 VITALS — BP 141/76 | HR 64 | Temp 98.4°F | Wt 321.0 lb

## 2020-04-02 DIAGNOSIS — R300 Dysuria: Secondary | ICD-10-CM | POA: Insufficient documentation

## 2020-04-02 DIAGNOSIS — E1142 Type 2 diabetes mellitus with diabetic polyneuropathy: Secondary | ICD-10-CM

## 2020-04-02 DIAGNOSIS — R309 Painful micturition, unspecified: Secondary | ICD-10-CM

## 2020-04-02 LAB — URINALYSIS, ROUTINE W REFLEX MICROSCOPIC
Bilirubin, UA: NEGATIVE
Glucose, UA: NEGATIVE
Leukocytes,UA: NEGATIVE
Nitrite, UA: NEGATIVE
RBC, UA: NEGATIVE
Specific Gravity, UA: 1.025 (ref 1.005–1.030)
Urobilinogen, Ur: 1 mg/dL (ref 0.2–1.0)
pH, UA: 5.5 (ref 5.0–7.5)

## 2020-04-02 LAB — MICROSCOPIC EXAMINATION

## 2020-04-02 NOTE — Assessment & Plan Note (Signed)
Worsened over the weekend 2/2 diet. As previously well controlled on current regimen with stricter diet, recommend diet changes for now. Patient refused rechecking a1c today, plan to recheck at f/u in 2 months.

## 2020-04-02 NOTE — Patient Instructions (Signed)
It was great to see you!  Our plans for today:  - Watch your diet and avoid sugary foods to keep your blood sugar under control.  - Check out DiseaseAlerts.[email or web address] for information on foods to avoid that can potentially cause bladder irritation.  - If your pain returns or gets worse, come back to see [place].   Take care and seek immediate care sooner if you develop any concerns.   Dr. Linwood Dibbles

## 2020-04-02 NOTE — Assessment & Plan Note (Addendum)
Now resolved. UA unrevealing. Suspect higher blood sugars over the weekend may have contributed to symptoms as have now resolved. Recommend avoiding irritative substances like caffeine, spicy foods, artifical sweeteners if symptoms return. F/u prn.

## 2020-04-02 NOTE — Progress Notes (Signed)
    SUBJECTIVE:   CHIEF COMPLAINT / HPI:   Patient Active Problem List   Diagnosis Date Noted  . Dysuria 04/02/2020  . Monoclonal gammopathy 02/20/2020  . MGUS (monoclonal gammopathy of unknown significance) 02/08/2018  . CKD (chronic kidney disease) stage 3, GFR 30-59 ml/min (HCC) 01/04/2018  . Chest pain 05/26/2017  . Throat pain 05/26/2017  . Advanced care planning/counseling discussion 11/24/2016  . Back pain 07/22/2016  . BMI 50.0-59.9, adult (HCC) 10/02/2014  . DM type 2 with diabetic peripheral neuropathy (HCC)   . Hyperlipidemia associated with type 2 diabetes mellitus (HCC)   . Hypertension associated with diabetes (HCC)    URINARY SYMPTOMS - sharp suprapubic pain occasionally, none today  Duration: few days Dysuria: yes Urinary frequency: no Urgency: no Small volume voids: no Urinary incontinence: no Foul odor: yes Hematuria: no Abdominal pain: yes but not new Back pain: no Suprapubic pain/pressure: yes Flank pain: no Fever:  no Vomiting: no Relief with cranberry juice: hasn't tried Relief with pyridium: hasn't tried Status: fluctuating Previous urinary tract infection: no Recurrent urinary tract infection: no Sexual activity: Not sexually active History of sexually transmitted disease: no Vaginal discharge: no Treatments attempted: none   Diabetes, Type 2 - Last A1c 7.4 11/2019 - Medications: glipizide 5mg  BID, januvia 100mg  - Compliance: good - Checking BG at home: yes, CBGs 260s. Lowest 140s. Normally runs 140s. - Diet: lots of cake and cheesecake over the holidays.  - Statin: yes - Denies symptoms of hypoglycemia, polyuria, polydipsia, numbness extremities, foot ulcers/trauma   OBJECTIVE:   BP (!) 141/76   Pulse 64   Temp 98.4 F (36.9 C) (Oral)   Wt (!) 321 lb (145.6 kg)   SpO2 97%   BMI 59.67 kg/m   Gen: obese, in NAD Card: RRR Lungs: CTAB Abd: soft, nonTTP diffusely, +BS. No rebound tenderness or guarding. No CVA tenderness.   Ext: WWP  ASSESSMENT/PLAN:   Dysuria Now resolved. UA unrevealing. Suspect higher blood sugars over the weekend may have contributed to symptoms as have now resolved. Recommend avoiding irritative substances like caffeine, spicy foods, artifical sweeteners if symptoms return. F/u prn.  DM type 2 with diabetic peripheral neuropathy (HCC) Worsened over the weekend 2/2 diet. As previously well controlled on current regimen with stricter diet, recommend diet changes for now. Patient refused rechecking a1c today, plan to recheck at f/u in 2 months.     07-03-1994, DO

## 2020-05-07 ENCOUNTER — Telehealth: Payer: Self-pay | Admitting: Pharmacist

## 2020-05-07 NOTE — Progress Notes (Signed)
Chronic Care Management Pharmacy Assistant   Name: Anna Juarez  MRN: [id number] DOB: December 04, 1950  Reason for Encounter: Coordination of Delivery of Medications.  PCP : Anna Billings, NP  Allergies:   Allergies  Allergen Reactions  . Actos [Pioglitazone]   . Metformin And Related Diarrhea  . Victoza [Liraglutide] Nausea And Vomiting    Medications: Outpatient Encounter Medications as of 05/07/2020  Medication Sig  . acetaminophen (TYLENOL) 500 MG tablet Take 500 mg by mouth as needed.  . benazepril (LOTENSIN) 40 MG tablet Take 1 tablet (40 mg total) by mouth daily.  . colesevelam (WELCHOL) 625 MG tablet Take 3 tablets (1,875 mg total) by mouth 2 (two) times daily. (Patient not taking: Reported on 04/02/2020)  . glipiZIDE (GLUCOTROL) 5 MG tablet Take 1 tablet (5 mg total) by mouth 2 (two) times daily before a meal.  . glucose blood (ONETOUCH ULTRA) test strip 1 each by Other route as needed for other. Use as instructed  . lovastatin (MEVACOR) 40 MG tablet TAKE 1 TABLET BY MOUTH EVERYDAY AT BEDTIME  . metoprolol succinate (TOPROL-XL) 50 MG 24 hr tablet Take 1 tablet (50 mg total) by mouth daily. Take with or immediately following a meal.  . NEXIUM 40 MG capsule TAKE 1 CAPSULE BY MOUTH EVERY DAY  . pregabalin (LYRICA) 150 MG capsule Take 1 capsule (150 mg total) by mouth 3 (three) times daily.  . sitaGLIPtin (JANUVIA) 100 MG tablet Take 1 tablet (100 mg total) by mouth daily.  . traMADol (ULTRAM) 50 MG tablet Take 1 tablet (50 mg total) by mouth every 8 (eight) hours as needed.   No facility-administered encounter medications on file as of 05/07/2020.    Current Diagnosis: Patient Active Problem List   Diagnosis Date Noted  . Dysuria 04/02/2020  . Monoclonal gammopathy 02/20/2020  . MGUS (monoclonal gammopathy of unknown significance) 02/08/2018  . CKD (chronic kidney disease) stage 3, GFR 30-59 ml/min (HCC) 01/04/2018  . Chest pain 05/26/2017  . Throat pain 05/26/2017   . Advanced care planning/counseling discussion 11/24/2016  . Back pain 07/22/2016  . BMI 50.0-59.9, adult ([place]) 10/02/2014  . DM type 2 with diabetic peripheral neuropathy ([place])   . Hyperlipidemia associated with type 2 diabetes mellitus ([place])   . Hypertension associated with diabetes ([place])     BP Readings from Last 3 Encounters:  04/02/20 (!) 141/76  02/20/20 (!) 145/70  11/10/19 118/73    Lab Results  Component Value Date   HGBA1C 7.4 (H) 11/10/2019   Reviewed chart for medication changes ahead of medication coordination call.  No medication changes indicated.  Patient obtains medications through Adherence Packaging  90 Days   Patient is due for next adherence delivery on: 05-09-2020.  Called patient and reviewed medications and coordinated delivery for 05-09-20 before 6 pm.   This delivery to include: . Benazepril 40 mg: Take one tablet by mouth at breakfast daily.  . Pregabalin 150 mg: Take 1 capsule by mouth three times daily.  . Januvia 100 mg: Take one tablet by mouth at breakfast daily.  . Lovastatin 40 mg: Take one tablet by mouth at bedtime daily.  . Glipizide 5 mg: Take 1 tablet by mouth at breakfast and at evening meal.  . Metoprolol Succinate XL 50 mg: Take one tablet by mouth at breakfast daily.  . Esomeprazole 40 mg: Take one capsule by mouth at breakfast daily.     Confirmed delivery date of 05-09-2020, advised patient that pharmacy will contact them the  morning of delivery.  During the phone conversation the patient reported no adverse reactions to her current medication regimen.   Cloretta Ned, LPN Clinical Pharmacist Assistant  [phone]    Follow-Up: Medications scheduled to be delivered 05-09-2020 before 6 pm.

## 2020-05-10 ENCOUNTER — Other Ambulatory Visit: Payer: Self-pay | Admitting: Family Medicine

## 2020-05-10 DIAGNOSIS — E785 Hyperlipidemia, unspecified: Secondary | ICD-10-CM

## 2020-05-10 DIAGNOSIS — I10 Essential (primary) hypertension: Secondary | ICD-10-CM

## 2020-05-10 NOTE — Telephone Encounter (Signed)
Requested medications are due for refill today es  Requested medications are on the active medication list yes  Last refill 11/7  Last visit 11/2019  Future visit scheduled 05/2020  Notes to clinic Pharmacy note states the pt is not taking this per the patient, has visit next week, please assess.

## 2020-05-13 NOTE — Telephone Encounter (Signed)
Routing to provider  

## 2020-05-14 ENCOUNTER — Other Ambulatory Visit: Payer: Self-pay | Admitting: Family Medicine

## 2020-05-16 ENCOUNTER — Ambulatory Visit: Payer: Medicare PPO

## 2020-05-17 ENCOUNTER — Ambulatory Visit (INDEPENDENT_AMBULATORY_CARE_PROVIDER_SITE_OTHER): Payer: Medicare PPO | Admitting: Nurse Practitioner

## 2020-05-17 ENCOUNTER — Other Ambulatory Visit: Payer: Self-pay

## 2020-05-17 ENCOUNTER — Telehealth: Payer: Self-pay

## 2020-05-17 ENCOUNTER — Encounter: Payer: Self-pay | Admitting: Nurse Practitioner

## 2020-05-17 VITALS — BP 125/73 | HR 57 | Temp 98.2°F | Ht 62.6 in | Wt 320.0 lb

## 2020-05-17 DIAGNOSIS — E1169 Type 2 diabetes mellitus with other specified complication: Secondary | ICD-10-CM

## 2020-05-17 DIAGNOSIS — Z0000 Encounter for general adult medical examination without abnormal findings: Secondary | ICD-10-CM | POA: Diagnosis not present

## 2020-05-17 DIAGNOSIS — N1832 Chronic kidney disease, stage 3b: Secondary | ICD-10-CM

## 2020-05-17 DIAGNOSIS — E1142 Type 2 diabetes mellitus with diabetic polyneuropathy: Secondary | ICD-10-CM

## 2020-05-17 DIAGNOSIS — E1159 Type 2 diabetes mellitus with other circulatory complications: Secondary | ICD-10-CM | POA: Diagnosis not present

## 2020-05-17 DIAGNOSIS — G8929 Other chronic pain: Secondary | ICD-10-CM

## 2020-05-17 DIAGNOSIS — Z6841 Body Mass Index (BMI) 40.0 and over, adult: Secondary | ICD-10-CM

## 2020-05-17 DIAGNOSIS — M810 Age-related osteoporosis without current pathological fracture: Secondary | ICD-10-CM

## 2020-05-17 DIAGNOSIS — I152 Hypertension secondary to endocrine disorders: Secondary | ICD-10-CM

## 2020-05-17 DIAGNOSIS — M545 Low back pain, unspecified: Secondary | ICD-10-CM

## 2020-05-17 DIAGNOSIS — E785 Hyperlipidemia, unspecified: Secondary | ICD-10-CM

## 2020-05-17 DIAGNOSIS — Z1211 Encounter for screening for malignant neoplasm of colon: Secondary | ICD-10-CM

## 2020-05-17 LAB — URINALYSIS, ROUTINE W REFLEX MICROSCOPIC
Bilirubin, UA: NEGATIVE
Glucose, UA: NEGATIVE
Ketones, UA: NEGATIVE
Nitrite, UA: NEGATIVE
Protein,UA: NEGATIVE
Specific Gravity, UA: 1.015 (ref 1.005–1.030)
Urobilinogen, Ur: 0.2 mg/dL (ref 0.2–1.0)
pH, UA: 5.5 (ref 5.0–7.5)

## 2020-05-17 LAB — BAYER DCA HB A1C WAIVED: HB A1C (BAYER DCA - WAIVED): 7.8 % — ABNORMAL HIGH (ref <7.0)

## 2020-05-17 LAB — MICROSCOPIC EXAMINATION: Bacteria, UA: NONE SEEN

## 2020-05-17 MED ORDER — TRAMADOL HCL 50 MG PO TABS: 50.0000 mg | ORAL_TABLET | Freq: Three times a day (TID) | ORAL | 1 refills | Status: DC | PRN

## 2020-05-17 MED ORDER — PREGABALIN 150 MG PO CAPS: 150.0000 mg | ORAL_CAPSULE | Freq: Three times a day (TID) | ORAL | 1 refills | Status: DC

## 2020-05-17 MED ORDER — SITAGLIPTIN PHOSPHATE 100 MG PO TABS: 100.0000 mg | ORAL_TABLET | Freq: Every day | ORAL | 1 refills | Status: DC

## 2020-05-17 NOTE — Assessment & Plan Note (Signed)
BPs stable and under good control, continue present medications and home monitoring. DASH diet, exercise reviewed.  Labs ordered today.

## 2020-05-17 NOTE — Chronic Care Management (AMB) (Signed)
  Chronic Care Management   Note  05/17/2020 Name: Anna Juarez MRN: [id number] DOB: 11-29-1950  [name] is a 70 y.o. year old female who is a primary care patient of [name], NP. I reached out to [name] by phone today in response to a referral sent by Ms. [name]'s PCP, [name], NP      [name] was given information about Chronic Care Management services today including:  1. CCM service includes personalized support from designated clinical staff supervised by her physician, including individualized plan of care and coordination with other care providers 2. 24/7 contact phone numbers for assistance for urgent and routine care needs. 3. Service will only be billed when office clinical staff spend 20 minutes or more in a month to coordinate care. 4. Only one practitioner may furnish and bill the service in a calendar month. 5. The patient may stop CCM services at any time (effective at the end of the month) by phone call to the office staff. 6. The patient will be responsible for cost sharing (co-pay) of up to 20% of the service fee (after annual deductible is met).  Patient agreed to services and verbal consent obtained.   Follow up plan: Telephone appointment with care management team member scheduled for:05/22/2020  Noreene Larsson, [place], [place], [place] [address] Direct Dial: [phone] [email or web address]

## 2020-05-17 NOTE — Assessment & Plan Note (Signed)
Stable and well controlled on lyrica, prn tramadol. Continue cautious use of that, precautions reviewed. 60 tabs should last at minimum 6 months for her.  PMP checked today. Foot exam done today.

## 2020-05-17 NOTE — Assessment & Plan Note (Signed)
Recheck lipids, adjust as needed. Diet and exercise.

## 2020-05-17 NOTE — Assessment & Plan Note (Signed)
Recommend lifestyle modifications to aid in weight loss.

## 2020-05-17 NOTE — Progress Notes (Signed)
BP 125/73   Pulse (!) 57   Temp 98.2 F (36.8 C) (Oral)   Ht 5' 2.6" (1.59 m)   Wt (!) 320 lb (145.2 kg)   SpO2 97%   BMI 57.41 kg/m    Subjective:    Patient ID: Anna Juarez, female    DOB: 1951-03-21, 70 y.o.   MRN: [id number]  HPI: Verne Rianne Degraaf is a 70 y.o. female presenting on 05/17/2020 for comprehensive medical examination. Current medical complaints include:none  She currently lives with: Husband Menopausal Symptoms: no   DIABETES Hypoglycemic episodes:no Polydipsia/polyuria: no Visual disturbance: no Chest pain: no Paresthesias: yes Glucose Monitoring: yes  Accucheck frequency: occassionally  Fasting glucose: over 200  Post prandial:  Evening:  Before meals: Taking Insulin?: no  Long acting insulin:  Short acting insulin: Blood Pressure Monitoring: rarely Retinal Examination: Patient has an appointment in May.  Foot Exam: Up to Date Diabetic Education: Not Completed Pneumovax: Up to Date Influenza: Up to Date Aspirin: no  HYPERTENSION / HYPERLIPIDEMIA Satisfied with current treatment? yes Duration of hypertension: chronic BP monitoring frequency: rarely BP range:  BP medication side effects: no Past BP meds: metoprolol and benazepril Duration of hyperlipidemia: years Cholesterol medication side effects: yes Cholesterol supplements: none Past cholesterol medications: lovastatin (mevacor) Medication compliance: excellent compliance Aspirin: no Recent stressors: no Recurrent headaches: no Visual changes: no Palpitations: no Dyspnea: no Chest pain: no Lower extremity edema: no Dizzy/lightheaded: no  Depression Screen done today and results listed below:  Depression screen [facility] 2/9 05/17/2020 05/15/2019 05/11/2019 01/03/2018 05/26/2017  Decreased Interest 0 0 0 0 0  Down, Depressed, Hopeless 0 0 0 0 0  PHQ - 2 Score 0 0 0 0 0  Altered sleeping - 0 - - -  Tired, decreased energy - 0 - - -  Change in appetite - 0 - - -  Feeling bad or  failure about yourself  - 0 - - -  Trouble concentrating - 0 - - -  Moving slowly or fidgety/restless - 0 - - -  Suicidal thoughts - 0 - - -  PHQ-9 Score - 0 - - -    The patient does not have a history of falls. I did not complete a risk assessment for falls. A plan of care for falls was not documented.    Past Medical History:  Past Medical History:  Diagnosis Date  . Chronic kidney disease   . Diabetes mellitus without complication ([place])   . Extreme obesity   . Fatty liver   . Hyperlipidemia   . Hypertension   . Radiculopathy, cervical   . Renal insufficiency    pt states she has chronic kidney disease.     Surgical History:  Past Surgical History:  Procedure Laterality Date  . ABDOMINAL HYSTERECTOMY    . JOINT REPLACEMENT    . SPINE SURGERY      Medications:  Current Outpatient Medications on File Prior to Visit  Medication Sig  . acetaminophen (TYLENOL) 500 MG tablet Take 500 mg by mouth as needed.  . benazepril (LOTENSIN) 40 MG tablet Take 1 tablet (40 mg total) by mouth daily.  Marland Kitchen glipiZIDE (GLUCOTROL) 5 MG tablet Take 1 tablet (5 mg total) by mouth 2 (two) times daily before a meal.  . glucose blood (ONETOUCH ULTRA) test strip 1 each by Other route as needed for other. Use as instructed  . lovastatin (MEVACOR) 40 MG tablet TAKE 1 TABLET BY MOUTH EVERYDAY AT BEDTIME  . metoprolol  succinate (TOPROL-XL) 50 MG 24 hr tablet TAKE 1 TABLET (50 MG TOTAL) BY MOUTH DAILY. TAKE WITH OR IMMEDIATELY FOLLOWING A MEAL.  Marland Kitchen NEXIUM 40 MG capsule TAKE 1 CAPSULE BY MOUTH EVERY DAY   No current facility-administered medications on file prior to visit.    Allergies:  Allergies  Allergen Reactions  . Actos [Pioglitazone]   . Metformin And Related Diarrhea  . Victoza [Liraglutide] Nausea And Vomiting    Social History:  Social History   Socioeconomic History  . Marital status: Married    Spouse name: Not on file  . Number of children: Not on file  . Years of education: Not  on file  . Highest education level: 12th grade  Occupational History  . Occupation: retired   Tobacco Use  . Smoking status: Never Smoker  . Smokeless tobacco: Never Used  Vaping Use  . Vaping Use: Never used  Substance and Sexual Activity  . Alcohol use: No  . Drug use: No  . Sexual activity: Not on file  Other Topics Concern  . Not on file  Social History Narrative   [place]; lives with husband; never smoker; no alcohol; retd. Manufacturing.    Social Determinants of Health   Financial Resource Strain: Not on file  Food Insecurity: Not on file  Transportation Needs: Not on file  Physical Activity: Not on file  Stress: Not on file  Social Connections: Not on file  Intimate Partner Violence: Not on file   Social History   Tobacco Use  Smoking Status Never Smoker  Smokeless Tobacco Never Used   Social History   Substance and Sexual Activity  Alcohol Use No    Family History:  Family History  Problem Relation Age of Onset  . Diabetes Mother   . Heart disease Mother   . Hypertension Mother   . Heart disease Father   . Cancer Brother   . Hypertension Brother   . Breast cancer Maternal Aunt   . Breast cancer Paternal Aunt     Past medical history, surgical history, medications, allergies, family history and social history reviewed with patient today and changes made to appropriate areas of the chart.   Review of Systems  Respiratory: Negative for cough.   Cardiovascular: Negative for chest pain, palpitations and leg swelling.  Neurological: Negative for dizziness and headaches.   All other ROS negative except what is listed above and in the HPI.      Objective:    BP 125/73   Pulse (!) 57   Temp 98.2 F (36.8 C) (Oral)   Ht 5' 2.6" (1.59 m)   Wt (!) 320 lb (145.2 kg)   SpO2 97%   BMI 57.41 kg/m   Wt Readings from Last 3 Encounters:  05/17/20 (!) 320 lb (145.2 kg)  04/02/20 (!) 321 lb (145.6 kg)  02/20/20 (!) 325 lb 6.4 oz (147.6  kg)    Physical Exam Vitals and nursing note reviewed.  Constitutional:      General: She is awake. She is not in acute distress.    Appearance: She is well-developed. She is obese. She is not ill-appearing.  HENT:     Head: Normocephalic and atraumatic.     Right Ear: Hearing, tympanic membrane, ear canal and external ear normal. No drainage.     Left Ear: Hearing, tympanic membrane, ear canal and external ear normal. No drainage.     Nose: Nose normal.     Right Sinus: No maxillary sinus  tenderness or frontal sinus tenderness.     Left Sinus: No maxillary sinus tenderness or frontal sinus tenderness.     Mouth/Throat:     Mouth: Mucous membranes are moist.     Pharynx: Oropharynx is clear. Uvula midline. No pharyngeal swelling, oropharyngeal exudate or posterior oropharyngeal erythema.  Eyes:     General: Lids are normal.        Right eye: No discharge.        Left eye: No discharge.     Extraocular Movements: Extraocular movements intact.     Conjunctiva/sclera: Conjunctivae normal.     Pupils: Pupils are equal, round, and reactive to light.     Visual Fields: Right eye visual fields normal and left eye visual fields normal.  Neck:     Thyroid: No thyromegaly.     Vascular: No carotid bruit.     Trachea: Trachea normal.  Cardiovascular:     Rate and Rhythm: Normal rate and regular rhythm.     Heart sounds: Normal heart sounds. No murmur heard. No gallop.   Pulmonary:     Effort: Pulmonary effort is normal. No accessory muscle usage or respiratory distress.     Breath sounds: Normal breath sounds.  Chest:  Breasts:     Right: Normal. No axillary adenopathy or supraclavicular adenopathy.     Left: Normal. No axillary adenopathy or supraclavicular adenopathy.    Abdominal:     General: Bowel sounds are normal.     Palpations: Abdomen is soft. There is no hepatomegaly or splenomegaly.     Tenderness: There is no abdominal tenderness.  Musculoskeletal:        General:  Normal range of motion.     Cervical back: Normal range of motion and neck supple.     Right lower leg: No edema.     Left lower leg: No edema.  Lymphadenopathy:     Head:     Right side of head: No submental, submandibular, tonsillar, preauricular or posterior auricular adenopathy.     Left side of head: No submental, submandibular, tonsillar, preauricular or posterior auricular adenopathy.     Cervical: No cervical adenopathy.     Upper Body:     Right upper body: No supraclavicular, axillary or pectoral adenopathy.     Left upper body: No supraclavicular, axillary or pectoral adenopathy.  Skin:    General: Skin is warm and dry.     Capillary Refill: Capillary refill takes less than 2 seconds.     Findings: No rash.  Neurological:     Mental Status: She is alert and oriented to person, place, and time.     Cranial Nerves: Cranial nerves are intact.     Gait: Gait is intact.     Deep Tendon Reflexes: Reflexes are normal and symmetric.     Reflex Scores:      Brachioradialis reflexes are 2+ on the right side and 2+ on the left side.      Patellar reflexes are 2+ on the right side and 2+ on the left side. Psychiatric:        Attention and Perception: Attention normal.        Mood and Affect: Mood normal.        Speech: Speech normal.        Behavior: Behavior normal. Behavior is cooperative.        Thought Content: Thought content normal.        Judgment: Judgment normal.     Results  for orders placed or performed in visit on 04/02/20  Microscopic Examination   Urine  Result Value Ref Range   WBC, UA 0-5 0 - 5 /hpf   RBC 0-2 0 - 2 /hpf   Epithelial Cells (non renal) 0-10 0 - 10 /hpf   Bacteria, UA Few None seen/Few  Urinalysis, Routine w reflex microscopic  Result Value Ref Range   Specific Gravity, UA 1.025 1.005 - 1.030   pH, UA 5.5 5.0 - 7.5   Color, UA Yellow Yellow   Appearance Ur Clear Clear   Leukocytes,UA Negative Negative   Protein,UA Trace (A) Negative/Trace    Glucose, UA Negative Negative   Ketones, UA Trace (A) Negative   RBC, UA Negative Negative   Bilirubin, UA Negative Negative   Urobilinogen, Ur 1.0 0.2 - 1.0 mg/dL   Nitrite, UA Negative Negative   Microscopic Examination See below:       Assessment & Plan:   Problem List Items Addressed This Visit      Cardiovascular and Mediastinum   Hypertension associated with diabetes ([place])    BPs stable and under good control, continue present medications and home monitoring. DASH diet, exercise reviewed.  Labs ordered today.       Relevant Medications   sitaGLIPtin (JANUVIA) 100 MG tablet     Endocrine   DM type 2 with diabetic peripheral neuropathy (HCC)    Stable and well controlled on lyrica, prn tramadol. Continue cautious use of that, precautions reviewed. 60 tabs should last at minimum 6 months for her.  PMP checked today. Foot exam done today.       Relevant Medications   pregabalin (LYRICA) 150 MG capsule   sitaGLIPtin (JANUVIA) 100 MG tablet   Other Relevant Orders   Bayer DCA Hb A1c Waived   Microalbumin, Urine Waived   AMB Referral to [facility]   Hyperlipidemia associated with type 2 diabetes mellitus ([place])    Recheck lipids, adjust as needed. Diet and exercise.      Relevant Medications   sitaGLIPtin (JANUVIA) 100 MG tablet     Genitourinary   CKD (chronic kidney disease) stage 3, GFR 30-59 ml/min (HCC)    Recheck labs, stay well hydrated.  Continue to follow up with Nephrology.        Other   BMI 50.0-59.9, adult ([place])    Recommend lifestyle modifications to aid in weight loss.       Relevant Medications   sitaGLIPtin (JANUVIA) 100 MG tablet   Back pain   Relevant Medications   traMADol (ULTRAM) 50 MG tablet    Other Visit Diagnoses    Annual physical exam    -  Primary   Health Maintenance reviewed with patient during visit.  Labs ordered today.    Relevant Orders   Comp Met (CMET)   Bayer DCA Hb A1c Waived   Lipid Profile   TSH    Urinalysis, Routine w reflex microscopic   CBC w/Diff   MM Digital Screening   DG Bone Density   Age-related osteoporosis without current pathological fracture       Relevant Orders   DG Bone Density   Screening for malignant neoplasm of colon       Relevant Orders   Cologuard       Follow up plan: Return in about 3 months (around 08/14/2020) for HTN, HLD, DM2 FU.   IMMUNIZATIONS:   - Tdap: Tetanus vaccination status reviewed: last tetanus booster within 10 years. - Influenza:  Up to date - Pneumovax: Up to date - Prevnar: Up to date - Zostavax vaccine: Reviewed at visit today.  Recommend patient receive at the pharmacy.   SCREENING: -Mammogram: Ordered today  - Colonoscopy: Ordered today  - Bone Density: Ordered today   PATIENT COUNSELING:   Advised to take 1 mg of folate supplement per day if capable of pregnancy.   Sexuality: Discussed sexually transmitted diseases, partner selection, use of condoms, avoidance of unintended pregnancy  and contraceptive alternatives.   Advised to avoid cigarette smoking.  I discussed with the patient that most people either abstain from alcohol or drink within safe limits (<=14/week and <=4 drinks/occasion for males, <=7/weeks and <= 3 drinks/occasion for females) and that the risk for alcohol disorders and other health effects rises proportionally with the number of drinks per week and how often a drinker exceeds daily limits.  Discussed cessation/primary prevention of drug use and availability of treatment for abuse.   Diet: Encouraged to adjust caloric intake to maintain  or achieve ideal body weight, to reduce intake of dietary saturated fat and total fat, to limit sodium intake by avoiding high sodium foods and not adding table salt, and to maintain adequate dietary potassium and calcium preferably from fresh fruits, vegetables, and low-fat dairy products.    stressed the importance of regular exercise  Injury prevention: Discussed  safety belts, safety helmets, smoke detector, smoking near bedding or upholstery.   Dental health: Discussed importance of regular tooth brushing, flossing, and dental visits.    NEXT PREVENTATIVE PHYSICAL DUE IN 1 YEAR. Return in about 3 months (around 08/14/2020) for HTN, HLD, DM2 FU.

## 2020-05-17 NOTE — Assessment & Plan Note (Signed)
Recheck labs, stay well hydrated.  Continue to follow up with Nephrology.

## 2020-05-18 LAB — CBC WITH DIFFERENTIAL/PLATELET
Basophils Absolute: 0 10*3/uL (ref 0.0–0.2)
Basos: 1 %
EOS (ABSOLUTE): 0.1 10*3/uL (ref 0.0–0.4)
Eos: 2 %
Hematocrit: 42 % (ref 34.0–46.6)
Hemoglobin: 13.6 g/dL (ref 11.1–15.9)
Immature Grans (Abs): 0 10*3/uL (ref 0.0–0.1)
Immature Granulocytes: 1 %
Lymphocytes Absolute: 1.4 10*3/uL (ref 0.7–3.1)
Lymphs: 25 %
MCH: 29.4 pg (ref 26.6–33.0)
MCHC: 32.4 g/dL (ref 31.5–35.7)
MCV: 91 fL (ref 79–97)
Monocytes Absolute: 0.5 10*3/uL (ref 0.1–0.9)
Monocytes: 9 %
Neutrophils Absolute: 3.7 10*3/uL (ref 1.4–7.0)
Neutrophils: 62 %
Platelets: 179 10*3/uL (ref 150–450)
RBC: 4.63 x10E6/uL (ref 3.77–5.28)
RDW: 12.4 % (ref 11.7–15.4)
WBC: 5.8 10*3/uL (ref 3.4–10.8)

## 2020-05-18 LAB — LIPID PANEL
Chol/HDL Ratio: 3.4 ratio (ref 0.0–4.4)
Cholesterol, Total: 162 mg/dL (ref 100–199)
HDL: 48 mg/dL (ref >39)
LDL Chol Calc (NIH): 83 mg/dL (ref 0–99)
Triglycerides: 180 mg/dL — ABNORMAL HIGH (ref 0–149)
VLDL Cholesterol Cal: 31 mg/dL (ref 5–40)

## 2020-05-18 LAB — COMPREHENSIVE METABOLIC PANEL
ALT: 49 IU/L — ABNORMAL HIGH (ref 0–32)
AST: 94 IU/L — ABNORMAL HIGH (ref 0–40)
Albumin/Globulin Ratio: 1.5 (ref 1.2–2.2)
Albumin: 4.5 g/dL (ref 3.7–4.7)
Alkaline Phosphatase: 64 IU/L (ref 44–121)
BUN/Creatinine Ratio: 15 (ref 12–28)
BUN: 17 mg/dL (ref 8–27)
Bilirubin Total: 0.6 mg/dL (ref 0.0–1.2)
CO2: 23 mmol/L (ref 20–29)
Calcium: 9.4 mg/dL (ref 8.7–10.3)
Chloride: 99 mmol/L (ref 96–106)
Creatinine, Ser: 1.15 mg/dL — ABNORMAL HIGH (ref 0.57–1.00)
GFR calc Af Amer: 55 mL/min/{1.73_m2} — ABNORMAL LOW (ref >59)
GFR calc non Af Amer: 48 mL/min/{1.73_m2} — ABNORMAL LOW (ref >59)
Globulin, Total: 3 g/dL (ref 1.5–4.5)
Glucose: 122 mg/dL — ABNORMAL HIGH (ref 65–99)
Potassium: 4.4 mmol/L (ref 3.5–5.2)
Sodium: 142 mmol/L (ref 134–144)
Total Protein: 7.5 g/dL (ref 6.0–8.5)

## 2020-05-18 LAB — TSH: TSH: 2.94 u[IU]/mL (ref 0.450–4.500)

## 2020-05-20 NOTE — Progress Notes (Signed)
Hi Anna Juarez.  It as a pleasure meeting you last week.  I am looking forward to getting to know you over our next few visits.  Your lab work came back and over all it looks good.  Kidney function is relatively the same, if not a little improved.  Make sure you are staying hydrated and avoids NSAIDS.    Your triglycerides were a little more elevated than prior.  I recommend you decrease your the fat in your diet and processed foods.    Your A1c is elevated from prior.  I know you have tried Victoza in the past and that caused you to have Nausea and Vomiting.  Are you will to try another injectable medication in the same class?  A lot of times, people are able to tolerated a different medication in the same class.  This is a once a week injection.  I am able to show you how to do this in the office if you would like.  The medication is called Trulicity.  If you would like to set up another appointment to discuss this we can do that.    Lastly, your urine showed you had a small amount of white blood cells in it.  Are you having an UTI symptoms?  I would like you to come back and repeat the urine so I can send it to make sure no bacteria grows.  We can do this at the same visit as discussing the new medication options for your diabetes.    I look forward to seeing you again.    Santiago Glad

## 2020-05-22 ENCOUNTER — Ambulatory Visit (INDEPENDENT_AMBULATORY_CARE_PROVIDER_SITE_OTHER): Payer: Medicare PPO | Admitting: Pharmacist

## 2020-05-22 DIAGNOSIS — N1832 Chronic kidney disease, stage 3b: Secondary | ICD-10-CM

## 2020-05-22 DIAGNOSIS — I152 Hypertension secondary to endocrine disorders: Secondary | ICD-10-CM

## 2020-05-22 DIAGNOSIS — E1142 Type 2 diabetes mellitus with diabetic polyneuropathy: Secondary | ICD-10-CM

## 2020-05-22 DIAGNOSIS — E1159 Type 2 diabetes mellitus with other circulatory complications: Secondary | ICD-10-CM

## 2020-05-22 NOTE — Progress Notes (Signed)
Chronic Care Management Pharmacy Note  06/04/2020 Name:  Anna Juarez MRN:  [id number] DOB:  10-29-1950  Subjective: Story Anna Juarez is an 70 y.o. year old female who is a primary patient of [name], NP.  The CCM team was consulted for assistance with disease management and care coordination needs.    Engaged with patient by telephone for initial visit in response to provider referral for pharmacy case management and/or care coordination services.   Consent to Services:  The patient was given the following information about Chronic Care Management services today, agreed to services, and gave verbal consent: 1. CCM service includes personalized support from designated clinical staff supervised by the primary care provider, including individualized plan of care and coordination with other care providers 2. 24/7 contact phone numbers for assistance for urgent and routine care needs. 3. Service will only be billed when office clinical staff spend 20 minutes or more in a month to coordinate care. 4. Only one practitioner may furnish and bill the service in a calendar month. 5.The patient may stop CCM services at any time (effective at the end of the month) by phone call to the office staff. 6. The patient will be responsible for cost sharing (co-pay) of up to 20% of the service fee (after annual deductible is met). Patient agreed to services and consent obtained.  Patient Care Team: [name], NP as PCP - [name], MD as Consulting Physician (Gastroenterology) [name], MD as Consulting Physician (Orthopedic Surgery) [name], [facility] (Pharmacist) [name], [facility] (Pharmacist)  Recent office visits: 2/11/22[name], NP- annual physical, bloodwork, DEXA, Mammo ordered, urie wbcs 04/02/20- Dr. [name]- urinary symptoms- resolved , BP 141/76, UA neg  Recent consult visits: 03/05/20- Dr. [name], Hem,/onc-IgA MGUS CKD state 3b , blood work,  monitor q6 months, 24 h urine =no monoclonal proteins  Hospital visits: None in previous 6 months  Objective:  Lab Results  Component Value Date   CREATININE 1.15 (H) 05/17/2020   BUN 17 05/17/2020   GFRNONAA 48 (L) 05/17/2020   GFRAA 55 (L) 05/17/2020   NA 142 05/17/2020   K 4.4 05/17/2020   CALCIUM 9.4 05/17/2020   CO2 23 05/17/2020    Lab Results  Component Value Date/Time   HGBA1C 7.8 (H) 05/17/2020 01:36 PM   HGBA1C 7.4 (H) 11/10/2019 02:25 PM   HGBA1C 6.8 (H) 05/15/2019 02:01 PM   HGBA1C 8.0 09/13/2018 12:00 AM   HGBA1C 6.8 03/21/2018 01:32 PM   MICROALBUR 30 (H) 01/02/2015 10:38 AM    Last diabetic Eye exam:  Lab Results  Component Value Date/Time   HMDIABEYEEXA No Retinopathy 04/28/2019 12:00 AM    Last diabetic Foot exam: No results found for: HMDIABFOOTEX   Lab Results  Component Value Date   CHOL 162 05/17/2020   HDL 48 05/17/2020   LDLCALC 83 05/17/2020   TRIG 180 (H) 05/17/2020   CHOLHDL 3.4 05/17/2020    Hepatic Function Latest Ref Rng & Units 05/17/2020 02/20/2020 11/10/2019  Total Protein 6.0 - 8.5 g/dL 7.5 7.8 7.1  Albumin 3.7 - 4.7 g/dL 4.5 4.0 4.3  AST 0 - 40 IU/L 94(H) 72(H) 74(H)  ALT 0 - 32 IU/L 49(H) 31 41(H)  Alk Phosphatase 44 - 121 IU/L 64 49 58  Total Bilirubin 0.0 - 1.2 mg/dL 0.6 0.6 0.5    Lab Results  Component Value Date/Time   TSH 2.940 05/17/2020 01:37 PM   TSH 2.730 01/03/2018 03:37 PM  CBC Latest Ref Rng & Units 05/17/2020 02/20/2020 05/15/2019  WBC 3.4 - 10.8 x10E3/uL 5.8 5.4 6.0  Hemoglobin 11.1 - 15.9 g/dL 13.6 12.9 13.2  Hematocrit 34.0 - 46.6 % 42.0 38.9 39.0  Platelets 150 - 450 x10E3/uL 179 162 178    No results found for: VD25OH  Clinical ASCVD: No  The 10-year ASCVD risk score [name], et al., 2013) is: 23.5%   Values used to calculate the score:     Age: 29 years     Sex: Female     Is Non-Hispanic African American: No     Diabetic: Yes     Tobacco smoker: No     Systolic Blood Pressure: 419  mmHg     Is BP treated: Yes     HDL Cholesterol: 48 mg/dL     Total Cholesterol: 162 mg/dL    Depression screen [facility] 2/9 05/17/2020 05/15/2019 05/11/2019  Decreased Interest 0 0 0  Down, Depressed, Hopeless 0 0 0  PHQ - 2 Score 0 0 0  Altered sleeping - 0 -  Tired, decreased energy - 0 -  Change in appetite - 0 -  Feeling bad or failure about yourself  - 0 -  Trouble concentrating - 0 -  Moving slowly or fidgety/restless - 0 -  Suicidal thoughts - 0 -  PHQ-9 Score - 0 -       Social History   Tobacco Use  Smoking Status Never Smoker  Smokeless Tobacco Never Used   BP Readings from Last 3 Encounters:  05/17/20 125/73  04/02/20 (!) 141/76  02/20/20 (!) 145/70   Pulse Readings from Last 3 Encounters:  05/17/20 (!) 57  04/02/20 64  02/20/20 69   Wt Readings from Last 3 Encounters:  05/17/20 (!) 320 lb (145.2 kg)  04/02/20 (!) 321 lb (145.6 kg)  02/20/20 (!) 325 lb 6.4 oz (147.6 kg)    Assessment/Interventions: Review of patient past medical history, allergies, medications, health status, including review of consultants reports, laboratory and other test data, was performed as part of comprehensive evaluation and provision of chronic care management services.   SDOH:  (Social Determinants of Health) assessments and interventions performed: Yes SDOH Interventions   Flowsheet Row Most Recent Value  SDOH Interventions   Financial Strain Interventions Other (Comment)  [voucher and patient assistance]      CCM Care Plan  Allergies  Allergen Reactions  . Actos [Pioglitazone]   . Metformin And Related Diarrhea  . Victoza [Liraglutide] Nausea And Vomiting    Medications Reviewed Today    Reviewed by [name], NP (Nurse Practitioner) on 05/17/20 at 1443  Med List Status: <None>  Medication Order Taking? Sig Documenting Provider Last Dose Status Informant  acetaminophen (TYLENOL) 500 MG tablet [id number] Yes Take 500 mg by mouth as needed. [provider]  Taking Active   benazepril (LOTENSIN) 40 MG tablet [id number] Yes Take 1 tablet (40 mg total) by mouth daily. Marnee Guarneri T, NP Taking Active   glipiZIDE (GLUCOTROL) 5 MG tablet [id number] Yes Take 1 tablet (5 mg total) by mouth 2 (two) times daily before a meal. Cannady, Jolene T, NP Taking Active   glucose blood (ONETOUCH ULTRA) test strip [id number] Yes 1 each by Other route as needed for other. Use as instructed Marnee Guarneri T, NP Taking Active   lovastatin (MEVACOR) 40 MG tablet [id number] Yes TAKE 1 TABLET BY MOUTH EVERYDAY AT BEDTIME Cannady, Jolene T, NP Taking Active   metoprolol succinate (TOPROL-XL) 50  MG 24 hr tablet [id number] Yes TAKE 1 TABLET (50 MG TOTAL) BY MOUTH DAILY. TAKE WITH OR IMMEDIATELY FOLLOWING A MEAL. Glean Hess, MD Taking Active   NEXIUM 40 MG capsule [id number] Yes TAKE 1 CAPSULE BY MOUTH EVERY DAY Volney American, [place] Taking Active   pregabalin (LYRICA) 150 MG capsule [id number]  Take 1 capsule (150 mg total) by mouth 3 (three) times daily. [name], NP  Active   sitaGLIPtin (JANUVIA) 100 MG tablet [id number]  Take 1 tablet (100 mg total) by mouth daily. [name], NP  Active   traMADol (ULTRAM) 50 MG tablet [id number]  Take 1 tablet (50 mg total) by mouth every 8 (eight) hours as needed. [name], NP  Active           Patient Active Problem List   Diagnosis Date Noted  . Monoclonal gammopathy 02/20/2020  . MGUS (monoclonal gammopathy of unknown significance) 02/08/2018  . CKD (chronic kidney disease) stage 3, GFR 30-59 ml/min (HCC) 01/04/2018  . Advanced care planning/counseling discussion 11/24/2016  . Back pain 07/22/2016  . BMI 50.0-59.9, adult ([place]) 10/02/2014  . DM type 2 with diabetic peripheral neuropathy ([place])   . Hyperlipidemia associated with type 2 diabetes mellitus ([place])   . Hypertension associated with diabetes ([place])     Immunization History  Administered Date(s) Administered  . Influenza, High Dose  Seasonal PF 01/07/2016, 01/19/2017, 01/03/2018, 01/13/2019  . Influenza,inj,Quad PF,6+ Mos 01/02/2015  . Influenza-Unspecified 02/28/2014, 01/19/2017, 02/05/2020  . Moderna Sars-Covid-2 Vaccination 04/21/2019, 05/19/2019, 02/19/2020  . Pneumococcal Conjugate-13 02/28/2014  . Pneumococcal Polysaccharide-23 10/31/2015  . Pneumococcal-Unspecified 11/14/2004  . Tdap 09/13/2013    Conditions to be addressed/monitored:  Hypertension, Hyperlipidemia, Diabetes, Chronic Kidney Disease and peripheral neuropathya and obesity  Care Plan : [place]  Updates made by [name], [place] since 06/04/2020 12:00 AM    Problem: Dm, HTN, HLD, GERD, Obesity   Priority: High  Onset Date: 05/22/2020    Long-Range Goal: disease management   Start Date: 05/22/2020  This Visit's Progress: Not on track  Priority: High  Note:   Current Barriers:  . Unable to independently afford treatment regimen . Unable to independently monitor therapeutic efficacy . Suboptimal therapeutic regimen for diabetes  . Lack of caregiver support  Pharmacist Clinical Goal(s):  Marland Kitchen Over the next 60 days, patient will verbalize ability to afford treatment regimen . achieve adherence to monitoring guidelines and medication adherence to achieve therapeutic efficacy . achieve control of diabetes as evidenced by A1c and SMBG readings . contact provider office for questions/concerns as evidenced notation of same in electronic health record through collaboration with PharmD and provider.    Interventions: . 1:1 collaboration with [name], NP regarding development and update of comprehensive plan of care as evidenced by provider attestation and co-signature . Inter-disciplinary care team collaboration (see longitudinal plan of care) . Comprehensive medication review performed; medication list updated in electronic medical record Lab Results  Component Value Date/Time   HGBA1C 7.8 (H) 05/17/2020 01:36 PM   HGBA1C  7.4 (H) 11/10/2019 02:25 PM   HGBA1C 6.8 (H) 05/15/2019 02:01 PM   HGBA1C 8.0 09/13/2018 12:00 AM   HGBA1C 6.8 03/21/2018 01:32 PM   eGRF ~ 61m/min , historicall ~ 40 ml/min  Diabetes (A1c goal <7%) -Not ideally controlled -Current medications: . Januvia 100 mg qd . Glipizide 5 mg bid . Lyrica 150 mg tid for neuropathy  -Medications previously tried: Victoza- N/V  -Current home glucose readings . fasting  glucose: Not checking . post prandial glucose: Nto checking -Denies hypoglycemic/hyperglycemic symptoms -Current exercise: Doesn't exercise -Educated onA1c and blood sugar goals; Complications of diabetes including kidney damage, retinal damage, and cardiovascular disease; Exercise goal of 150 minutes per week; Benefits of weight loss; Benefits of routine self-monitoring of blood sugar; Reviewed PCP note with patient regarding GLP-1 trial -Counseled to check feet daily and get yearly eye exams -Counseled on diet and exercise extensively Recommended Change Januvia to [name] due to patient renal dysfunction. Consider referral to GI given 6 month h/o slowed gastric emptying, fullness  Recommend decreasing pregabalin to 100 mg tid for renal function (upcoming nephrology appt 06/13/20) Collaborated with PCP, Jovita Kussmaul, to change to [name] and refer to GI Assessed patient finances. [place] voucher sent to pharmacy and PAP application for [place] mailed to patient.  Hyperlipidemia: (LDL goal < 70) -Not ideally controlled last ldl 83 -Current treatment: . Lovastatin 40 mg  -Medications previously tried: Unknown  --Educated on Benefits of statin for ASCVD risk reduction; Importance of limiting foods high in cholesterol; Exercise goal of 150 minutes per week; -Counseled on diet and exercise extensively Recommended consider high intensity statin since LDL trending up   BP Readings from Last 3 Encounters:  05/17/20 125/73  04/02/20 (!) 141/76  02/20/20 (!) 145/70     Hypertension (BP goal <130/80) -Not ideally controlled -Current treatment: . Benazepril 40 mg qd . Toprol XL 50 mg qd -Medications previously tried: NA  -Current home readings: not checking  -Denies hypotensive/hypertensive symptoms -Educated on BP goals and benefits of medications for prevention of heart attack, stroke and kidney damage; Daily salt intake goal < 2300 mg; Exercise goal of 150 minutes per week; Importance of home blood pressure monitoring; -Counseled to monitor BP at home 2-3 times weekly, document, and provide log at future appointments -Counseled on diet and exercise extensively Recommended to continue current medication  Osteoporosis / Osteopenia (Goal prevent fracture) -Controlled -Last DEXA Scan: 2015   T-Score femoral neck: -0.7  T-Score lumbar spine: 0.0  -Patient overdue for follow-up scan-- ordered by PCP -Current treatment  . None -Medications previously tried: na  -Recommend [phone] units of vitamin D daily. Recommend 1200 mg of calcium daily from dietary and supplemental sources. Recommend weight-bearing and muscle strengthening exercises for building and maintaining bone density. -Counseled on diet and exercise extensively  Chronic low back pain (Goal: minimize symptoms avoid adverse effects) -Controlled -Current treatment  . Acetaminophen 500 mg prn  . Tramadole 50 mg q8h prn -Medications previously tried: na  -Recommended to continue current medication Counseled on avoiding NSAIDS due to reanl dysfunction  GERD/Reflux?? (Goal: minimize symptoms ) -Controlled -Current treatment  . esomeprazole 40 mg qd -Medications previously tried: na  -Recommended to continue current medication Recommended magnesium level with next labs Collaborated with PCP for GI referral  Patient reports bloating, delayed gastric emptying and fullness after eating for ~ 6 months. She denies any burning epigastric pain, tarry stools or discolored urine.      Patient Goals/Self-Care Activities . Over the next 60 days, patient will:  - take medications as prescribed check glucose daily, document, and provide at future appointments collaborate with provider on medication access solutions  Follow Up Plan: Telephone follow up appointment with care management team member scheduled for: 2 months      Medication Assistance: Application for Farxiga  medication assistance program. in process.  Anticipated assistance start date unknown.  See plan of care for additional detail.  Patient's preferred pharmacy [facility]:  [id number] [address]Phone: [phone]: [phone] [facility] [place] [place]Phone: [phone]: [phone] Uses pill box? No - compliance packaging Pt endorses 99% compliance   Care Plan and Follow Up Patient Decision:  Patient agrees to Care Plan and Follow-up.  Plan: Telephone follow up appointment with care management team member scheduled for:  2 months  JJunita Push HKenton KingfisherPharmD, [phone]

## 2020-05-23 ENCOUNTER — Other Ambulatory Visit: Payer: Self-pay | Admitting: Family Medicine

## 2020-05-23 ENCOUNTER — Telehealth: Payer: Self-pay | Admitting: Nurse Practitioner

## 2020-05-23 DIAGNOSIS — I10 Essential (primary) hypertension: Secondary | ICD-10-CM

## 2020-05-23 NOTE — Telephone Encounter (Signed)
Copied from [place] [phone]. Topic: Medicare AWV >> May 23, 2020 10:25 AM Cher Nakai R wrote: Reason for CRM:  Left message for patient to call back.  Trying to reschedule the Medicare Annual Wellness Visit (AWV) virtually or by telephone that she had to cancel due to lack of transportation.  Last AWV 05/11/2019  Please schedule at anytime with CFP-Nurse Health Advisor.  45 minute appointment  Any questions, please call me at [phone]

## 2020-05-24 ENCOUNTER — Telehealth: Payer: Self-pay | Admitting: Nurse Practitioner

## 2020-05-24 DIAGNOSIS — K30 Functional dyspepsia: Secondary | ICD-10-CM

## 2020-05-24 MED ORDER — DAPAGLIFLOZIN PROPANEDIOL 5 MG PO TABS: 5.0000 mg | ORAL_TABLET | Freq: Every day | ORAL | 0 refills | Status: DC

## 2020-05-24 NOTE — Telephone Encounter (Signed)
Patient notified of Karen's recommendations. Patient was also notified that her GI referral was sent over. Patient states she would like to go to Dr.Wohl's office as she has seen him previously in the past and had a great experience with his office and she prefers the [facility] location.

## 2020-05-24 NOTE — Telephone Encounter (Signed)
Can we please let the patient know that I spoke with Anna Juarez the pharmacist and I changed her Januvia to [name].  [facility]  [facility]   I also placed a referral to GI since she has bene having bloating, delayed gastric emptying for over 6 months.

## 2020-05-24 NOTE — Telephone Encounter (Signed)
-----   Message from [name], [facility] sent at 05/24/2020  2:53 PM EST ----- Tobey Bride, I spoke with Lakindra yesterday. She hadn't seen your my chart message so I reviewed with her. She is not opposed to trialing a weekly GLP-1 but does report she has been experiencing bloating, delayed gastric emptying and fullness after eating for ~ 6 months. She denies any burning epigastric pain, tarry stools or discolored urine. I can't find documentation of her last endoscopy but she reports it was during her bout of severe N/V with Victoza. Do you think she could have some gastroparesis? Does she need a referral to GI?   How would you feel about changing Januvia ( her average renal function is too low for the 100 mg dose) to [name]? This would offer her renal & cardiovascular protection. I can give her a voucher for a free one month supply and initiate patient assistance if you agree. Let me know your thoughts or if you'd like to discuss further.  My direct line is [phone]. Thanks!

## 2020-05-29 ENCOUNTER — Telehealth: Payer: Self-pay | Admitting: Pharmacist

## 2020-05-30 NOTE — Progress Notes (Addendum)
    Chronic Care Management Pharmacy Assistant   Name: Anna Juarez  MRN: [id number] DOB: 09-Apr-1950  Reason for Encounter: Medication Review   PCP : [name], NP  Allergies:   Allergies  Allergen Reactions   Actos [Pioglitazone]    Metformin And Related Diarrhea   Victoza [Liraglutide] Nausea And Vomiting    Medications: Outpatient Encounter Medications as of 05/29/2020  Medication Sig   acetaminophen (TYLENOL) 500 MG tablet Take 500 mg by mouth as needed.   benazepril (LOTENSIN) 40 MG tablet TAKE 1 TABLET BY MOUTH EVERY DAY   dapagliflozin propanediol (FARXIGA) 5 MG TABS tablet Take 1 tablet (5 mg total) by mouth daily before breakfast.   glipiZIDE (GLUCOTROL) 5 MG tablet Take 1 tablet (5 mg total) by mouth 2 (two) times daily before a meal.   glucose blood (ONETOUCH ULTRA) test strip 1 each by Other route as needed for other. Use as instructed   lovastatin (MEVACOR) 40 MG tablet TAKE 1 TABLET BY MOUTH EVERYDAY AT BEDTIME   metoprolol succinate (TOPROL-XL) 50 MG 24 hr tablet TAKE 1 TABLET (50 MG TOTAL) BY MOUTH DAILY. TAKE WITH OR IMMEDIATELY FOLLOWING A MEAL.   NEXIUM 40 MG capsule TAKE 1 CAPSULE BY MOUTH EVERY DAY   pregabalin (LYRICA) 150 MG capsule Take 1 capsule (150 mg total) by mouth 3 (three) times daily.   traMADol (ULTRAM) 50 MG tablet Take 1 tablet (50 mg total) by mouth every 8 (eight) hours as needed.   No facility-administered encounter medications on file as of 05/29/2020.    Current Diagnosis: Patient Active Problem List   Diagnosis Date Noted   Monoclonal gammopathy 02/20/2020   MGUS (monoclonal gammopathy of unknown significance) 02/08/2018   CKD (chronic kidney disease) stage 3, GFR 30-59 ml/min (HCC) 01/04/2018   Advanced care planning/counseling discussion 11/24/2016   Back pain 07/22/2016   BMI 50.0-59.9, adult ([place]) 10/02/2014   DM type 2 with diabetic peripheral neuropathy ([place])    Hyperlipidemia associated with type 2 diabetes  mellitus ([place])    Hypertension associated with diabetes ([place])     Goals Addressed   None    Reached out to the patient to follow up on her starting her new medication Farxiga 5mg . The patient explained she was confused and had not started the medication. She wanted to confirm if she needed to stop her Januvia before starting the [name]. Per chart review and confirmation by Birdena Crandall PharmD, the patient's Celesta Gentile was instructed to be stopped and to start Farxiga daily in the mornings. The patient confirmed understanding of new instructions. Instructed the patient to start a blood sugar log.   Also made the patient aware we sent a voucher for a free month supply of Farxiga to her pharmacy and also prefilled a patient assistance application that would be mailed to her on 05-30-20. Again she verbalized understanding.   Cloretta Ned, LPN Clinical Pharmacist Assistant  [phone]   Follow-Up:  Pharmacist Review  I have reviewed the care management and care coordination activities outlined in this encounter and I am certifying that I agree with the content of this note.  Junita Push. Kenton Kingfisher PharmD, [facility] [phone]

## 2020-06-04 NOTE — Patient Instructions (Addendum)
Visit Information  It was a pleasure speaking with you today! Thank you for letting me be a part of your care team. Please call with any questions or concerns.  Goals Addressed            This Visit's Progress   . Manage Chronic Pain       Timeframe:  Long-Range Goal Priority:  Medium Start Date:                             Expected End Date:                       Follow Up Date 2 month follow up   - call for medicine refill 2 or 3 days before it runs out - develop a personal pain management plan - track times pain is worst and when it is best - use ice or heat for pain relief    Why is this important?    Day-to-day life can be hard when you have chronic pain.   Pain medicine is just one piece of the treatment puzzle.   You can try these action steps to help you manage your pain.    Notes:     Marland Kitchen Monitor and Manage My Blood Sugar-Diabetes Type 2       Timeframe:  Long-Range Goal Priority:  High Start Date:                             Expected End Date:                       Follow Up Date 2 month follow up   - check blood sugar at prescribed times - enter blood sugar readings and medication or insulin into daily log - take the blood sugar log to all doctor visits - take the blood sugar meter to all doctor visits    Why is this important?    Checking your blood sugar at home helps to keep it from getting very high or very low.   Writing the results in a diary or log helps the doctor know how to care for you.   Your blood sugar log should have the time, date and the results.   Also, write down the amount of insulin or other medicine that you take.   Other information, like what you ate, exercise done and how you were feeling, will also be helpful.     Notes:     . Obtain Eye Exam-Diabetes Type 2       Follow Up Date 2 month follow up   - schedule appointment with eye doctor    Why is this important?    Eye check-ups are important when you have diabetes.    Vision loss can be prevented.    Notes:     . Perform Foot Care-Diabetes Type 2       Timeframe:  Long-Range Goal Priority:  Medium Start Date:                             Expected End Date:                       Follow Up Date 2 month follow up   - check feet daily for  cuts, sores or redness - keep feet up while sitting - trim toenails straight across - wear comfortable, well-fitting shoes    Why is this important?    Good foot care is very important when you have diabetes.   There are many things you can do to keep your feet healthy and catch a problem early.    Notes:        Ms. Mower was given information about Chronic Care Management services today including:  1. CCM service includes personalized support from designated clinical staff supervised by her physician, including individualized plan of care and coordination with other care providers 2. 24/7 contact phone numbers for assistance for urgent and routine care needs. 3. Standard insurance, coinsurance, copays and deductibles apply for chronic care management only during months in which we provide at least 20 minutes of these services. Most insurances cover these services at 100%, however patients may be responsible for any copay, coinsurance and/or deductible if applicable. This service may help you avoid the need for more expensive face-to-face services. 4. Only one practitioner may furnish and bill the service in a calendar month. 5. The patient may stop CCM services at any time (effective at the end of the month) by phone call to the office staff.  Patient agreed to services and verbal consent obtained.   The patient verbalized understanding of instructions, educational materials, and care plan provided today and agreed to receive a mailed copy of patient instructions, educational materials, and care plan.  The pharmacy team will reach out to the patient again over the next 60 days.   Junita Push. Thayer PharmD,  BCPS Clinical Pharmacist [phone]  Preventing Diabetes Mellitus Complications You can help to prevent or slow down problems that are caused by diabetes (diabetes mellitus). Following your diabetes plan and taking care of yourself can reduce your risk of serious or life-threatening complications. What actions can I take to prevent diabetes complications? Diabetes management  Follow instructions from your health care providers about managing your diabetes. Your diabetes may be managed by a team of health care providers who can teach you how to care for yourself and can answer questions that you have.  Educate yourself about your condition so you can make healthy choices about eating and physical activity.  Know your target range for your blood sugar (glucose), and check your blood glucose level as often as told. Your health care provider will help you decide how often to check your blood glucose level depending on your treatment goals and how well you are meeting them.  Ask your health care provider if you should take low-dose aspirin daily and what dose is recommended for you. Taking low-dose aspirin daily is recommended to help prevent cardiovascular disease.   Controlling your blood pressure and cholesterol Your personal target blood pressure is determined based on:  Your age.  Your medicines.  How long you have had diabetes.  Any other medical conditions you have. To control your blood pressure:  Follow instructions from your health care provider about meal planning, exercise, and medicines.  Make sure your health care provider checks your blood pressure at every medical visit.  Monitor your blood pressure at home as told by your health care provider. To control your cholesterol:  Follow instructions from your health care provider about meal planning, exercise, and medicines.  Have your cholesterol checked at least once a year.  You may be prescribed medicine to lower  cholesterol (statin). If you are not taking a  statin, ask your health care provider if you should be. Controlling your cholesterol may:  Help prevent heart disease and stroke. These are the most common health problems for people with diabetes.  Improve your blood flow.   Medical appointments and vaccines Schedule and keep yearly physical exams and eye exams. Your health care provider will tell you how often you need medical visits depending on your diabetes management plan. Keep all follow-up visits as told. This is important so possible problems can be identified early and complications can be avoided or treated.  Every visit with your health care provider should include measuring your: ? Weight. ? Blood pressure. ? Blood glucose control.  Your A1C (hemoglobin A1C) level should be checked: ? At least 2 times a year, if you are meeting your treatment goals. ? 4 times a year, if you are not meeting treatment goals or if your treatment goals have changed.  Your blood lipids (lipid profile) should be checked yearly. You should also be checked yearly for protein in your urine (urine microalbumin).  If you have type 1 diabetes, get an eye exam 3-5 years after you are diagnosed, and then once a year after your first exam.  If you have type 2 diabetes, get an eye exam as soon as you are diagnosed, and then once a year after your first exam. It is also important to keep your vaccines current. It is recommended that you receive:  A flu (influenza) vaccine every year.  A pneumonia (pneumococcal) vaccine and a hepatitis B vaccine. If you are age 63 or older, you may get the pneumonia vaccine as a series of two separate shots. Ask your health care provider which other vaccines may be recommended. Lifestyle  Do not use any products that contain nicotine or tobacco, such as cigarettes, e-cigarettes, and chewing tobacco. If you need help quitting, ask your health care provider. By avoiding nicotine  and tobacco: ? You will lower your risk for heart attack, stroke, nerve disease, and kidney disease. ? Your cholesterol and blood pressure may improve. ? Your blood circulation will improve.  If you drink alcohol: ? Limit how much you use to:  0-1 drink a day for women who are not pregnant.  0-2 drinks a day for men. ? Be aware of how much alcohol is in your drink. In the [place] one drink equals one 12 oz bottle of beer (355 mL), one 5 oz glass of wine (148 mL), or one 11?2 oz glass of hard liquor (44 mL). Taking care of your feet Diabetes may cause you to have poor blood circulation to your legs and feet. Because of this, taking care of your feet is very important. Diabetes can cause:  The skin on the feet to get thinner, break more easily, and heal more slowly.  Nerve damage in your legs and feet, which results in decreased feeling. You may not notice minor injuries that could lead to serious problems. To avoid foot problems:  Check your skin and feet every day for cuts, bruises, redness, blisters, or sores.  Schedule a foot exam with your health care provider once every year. This exam includes: ? Inspecting the structure and skin of your feet. ? Checking the pulses and sensation in your feet.  Make sure that your health care provider performs a visual foot exam at every medical visit.   Taking care of your teeth People with poorly controlled diabetes are more likely to have gum (periodontal) disease. Diabetes can make periodontal  diseases harder to control. If not treated, periodontal diseases can lead to tooth loss. To prevent this:  Brush your teeth twice a day.  Floss at least once a day.  Visit your dentist 2 times a year. Managing stress Living with diabetes can be stressful. When you are experiencing stress, your blood glucose may be affected in two ways:  Stress hormones may cause your blood glucose to rise.  You may be distracted from taking good care of  yourself. Be aware of your stress level and make changes to help you manage challenging situations. To lower your stress levels:  Consider joining a support group.  Do planned relaxation or meditation.  Do a hobby that you enjoy.  Maintain healthy relationships.  Exercise regularly.  Work with your health care provider or a mental health professional. Where to find more information  American Diabetes Association: www.diabetes.org  Association of Diabetes Care and Education Specialists: www.diabeteseducator.org Summary  You can take action to prevent or slow down problems that are caused by diabetes (diabetes mellitus). Following your diabetes plan and taking care of yourself can reduce your risk of serious or life-threatening complications.  Follow instructions from your health care providers about managing your diabetes. Your diabetes may be managed by a team of health care providers who can teach you how to care for yourself and can answer questions that you have.  Know your target range for your blood sugar (glucose), and check your blood glucose levels as often as told. Your health care provider will help you decide how often you should check your blood glucose level depending on your treatment goals and how well you are meeting them.  Your health care provider will tell you how often you need medical visits depending on your diabetes management plan. Keep all follow-up visits as directed. This is important so possible problems can be identified early and complications can be avoided or treated. This information is not intended to replace advice given to you by your health care provider. Make sure you discuss any questions you have with your health care provider. Document Revised: 05/12/2019 Document Reviewed: 05/12/2019 Elsevier Patient Education  2021 [employer].

## 2020-06-11 ENCOUNTER — Encounter: Payer: Self-pay | Admitting: *Deleted

## 2020-06-18 ENCOUNTER — Telehealth: Payer: Self-pay | Admitting: Pharmacist

## 2020-06-18 NOTE — Chronic Care Management (AMB) (Signed)
Chronic Care Management Pharmacy Assistant   Name: Anna Juarez  MRN: [id number] DOB: Mar 15, 1951  Reason for Encounter: Disease State/Diabetes Adherence Call  Recent office visits:  n/a  Recent consult visits:  [facility] visits:  None in previous 6 months  Medications: Outpatient Encounter Medications as of 06/18/2020  Medication Sig  . acetaminophen (TYLENOL) 500 MG tablet Take 500 mg by mouth as needed.  . benazepril (LOTENSIN) 40 MG tablet TAKE 1 TABLET BY MOUTH EVERY DAY  . dapagliflozin propanediol (FARXIGA) 5 MG TABS tablet Take 1 tablet (5 mg total) by mouth daily before breakfast.  . glipiZIDE (GLUCOTROL) 5 MG tablet Take 1 tablet (5 mg total) by mouth 2 (two) times daily before a meal.  . glucose blood (ONETOUCH ULTRA) test strip 1 each by Other route as needed for other. Use as instructed  . lovastatin (MEVACOR) 40 MG tablet TAKE 1 TABLET BY MOUTH EVERYDAY AT BEDTIME  . metoprolol succinate (TOPROL-XL) 50 MG 24 hr tablet TAKE 1 TABLET (50 MG TOTAL) BY MOUTH DAILY. TAKE WITH OR IMMEDIATELY FOLLOWING A MEAL.  Marland Kitchen NEXIUM 40 MG capsule TAKE 1 CAPSULE BY MOUTH EVERY DAY  . pregabalin (LYRICA) 150 MG capsule Take 1 capsule (150 mg total) by mouth 3 (three) times daily.  . traMADol (ULTRAM) 50 MG tablet Take 1 tablet (50 mg total) by mouth every 8 (eight) hours as needed.   No facility-administered encounter medications on file as of 06/18/2020.    Recent Relevant Labs: Lab Results  Component Value Date/Time   HGBA1C 7.8 (H) 05/17/2020 01:36 PM   HGBA1C 7.4 (H) 11/10/2019 02:25 PM   HGBA1C 6.8 (H) 05/15/2019 02:01 PM   HGBA1C 8.0 09/13/2018 12:00 AM   HGBA1C 6.8 03/21/2018 01:32 PM   MICROALBUR 30 (H) 01/02/2015 10:38 AM    Kidney Function Lab Results  Component Value Date/Time   CREATININE 1.15 (H) 05/17/2020 01:37 PM   CREATININE 1.34 (H) 02/20/2020 03:48 PM   GFRNONAA 48 (L) 05/17/2020 01:37 PM   GFRNONAA 42 (L) 02/20/2020 03:48 PM   GFRAA 55 (L)  05/17/2020 01:37 PM    . Current antihyperglycemic regimen:  o Farxiga 5 mg; one tablet daily before breakfast o Glipizide 5 mg; one tablet two times daily before a meal  . What recent interventions/DTPs have been made to improve glycemic control:  o Patient was switched from Januvia 100 mg daily to [name] 5 mg daily due to patient renal dysfunction.  . Have there been any recent hospitalizations or ED visits since last visit with CPP? No   . Patient denies hypoglycemic symptoms, including Pale, Sweaty, Shaky, Hungry and Nervous/irritable   . Patient reports hyperglycemic symptoms, including blurry vision, excessive thirst, fatigue and polyuria   . How often are you checking your blood sugar? once daily   . What are your blood sugars ranging?  o Fasting: 200-260 o Before meals: n/a o After meals: n/a o Bedtime: n/a  . During the week, how often does your blood glucose drop below 70? Never   . Are you checking your feet daily/regularly? Yes, patient states she checks her feet regularly.  Adherence Review: Is the patient currently on a STATIN medication? Yes Is the patient currently on ACE/ARB medication? Yes Does the patient have >5 day gap between last estimated fill dates? No   Patient states [name] recently changed her from [name] to [name]. Patient states she "feels woozy" and her sugars are always high. She states her sugars are  always over 200. Patient states she would like to discuss other treatment options as she feels this medication "is not doing her sugars any good."   Patient states since she started taking a Probiotic as recommended. She states her stomach pain has improved from a 9 to a 2. She has been able to drink more fluids. Patient states since starting the probiotic she has been able to drink a lot more water than she was before.   Star Rating Drugs: Benazepril 40 mg last filled 02/25/2020 90 DS Lovastatin 40 mg last filled 12/21/2019 90 DS Januvia 100 mg  last filled 03/03/2020 90 DS Glipizide 5 mg last filled 02/11/2020 90 DS  Future Appointments  Date Time Provider [place]  08/14/2020  1:00 PM [name], NP [facility] [place]    Patient scheduled a follow up telephone visit with Birdena Crandall, CPP tomorrow 06/19/2020 at 2:00 pm.  [name], [place] Pharmacist Assistant [phone]

## 2020-06-19 ENCOUNTER — Ambulatory Visit (INDEPENDENT_AMBULATORY_CARE_PROVIDER_SITE_OTHER): Payer: Medicare PPO | Admitting: Pharmacist

## 2020-06-19 DIAGNOSIS — I152 Hypertension secondary to endocrine disorders: Secondary | ICD-10-CM

## 2020-06-19 DIAGNOSIS — E1159 Type 2 diabetes mellitus with other circulatory complications: Secondary | ICD-10-CM

## 2020-06-19 DIAGNOSIS — E1142 Type 2 diabetes mellitus with diabetic polyneuropathy: Secondary | ICD-10-CM

## 2020-06-19 DIAGNOSIS — N1832 Chronic kidney disease, stage 3b: Secondary | ICD-10-CM

## 2020-06-19 NOTE — Progress Notes (Signed)
Chronic Care Management Pharmacy Note  07/02/2020 Name:  Anna Juarez MRN:  [id number] DOB:  06/27/1950  Subjective: Anna Juarez is an 70 y.o. year old female who is a primary patient of [name], NP.  The CCM team was consulted for assistance with disease management and care coordination needs.    Engaged with patient by telephone for follow up visit in response to provider referral for pharmacy case management and/or care coordination services.   Consent to Services:  The patient was given information about Chronic Care Management services, agreed to services, and gave verbal consent prior to initiation of services.  Please see initial visit note for detailed documentation.   Patient Care Team: [name], NP as PCP - [name], MD as Consulting Physician (Gastroenterology) [name], MD as Consulting Physician (Orthopedic Surgery) [name], [facility] (Pharmacist) [name], [facility] (Pharmacist)  Recent office visits: 2/11/22[name], NP- annual physical, bloodwork, DEXA, Mammo ordered, urie wbcs, change januvia to farxiga 04/02/20- Dr. [name]- urinary symptoms- resolved , BP 141/76, UA neg  Recent consult visits: 3/10/22Holley Juarez (nephrology) no med changes 06/03/20 - Anna Juarez( Nephro)- lab work 03/05/20- Dr. [name], Hem,/onc-IgA MGUS CKD state 3b , blood work, monitor q6 months, 24 h urine =no monoclonal proteins  Hospital visits: None in previous 6 months  Objective:  Lab Results  Component Value Date   CREATININE 1.15 (H) 05/17/2020   BUN 17 05/17/2020   GFRNONAA 48 (L) 05/17/2020   GFRAA 55 (L) 05/17/2020   NA 142 05/17/2020   K 4.4 05/17/2020   CALCIUM 9.4 05/17/2020   CO2 23 05/17/2020    Lab Results  Component Value Date/Time   HGBA1C 7.8 (H) 05/17/2020 01:36 PM   HGBA1C 7.4 (H) 11/10/2019 02:25 PM   HGBA1C 6.8 (H) 05/15/2019 02:01 PM   HGBA1C 8.0 09/13/2018 12:00 AM   HGBA1C 6.8 03/21/2018 01:32 PM    MICROALBUR 30 (H) 01/02/2015 10:38 AM    Last diabetic Eye exam:  Lab Results  Component Value Date/Time   HMDIABEYEEXA No Retinopathy 04/28/2019 12:00 AM    Last diabetic Foot exam: No results found for: HMDIABFOOTEX   Lab Results  Component Value Date   CHOL 162 05/17/2020   HDL 48 05/17/2020   LDLCALC 83 05/17/2020   TRIG 180 (H) 05/17/2020   CHOLHDL 3.4 05/17/2020    Hepatic Function Latest Ref Rng & Units 05/17/2020 02/20/2020 11/10/2019  Total Protein 6.0 - 8.5 g/dL 7.5 7.8 7.1  Albumin 3.7 - 4.7 g/dL 4.5 4.0 4.3  AST 0 - 40 IU/L 94(H) 72(H) 74(H)  ALT 0 - 32 IU/L 49(H) 31 41(H)  Alk Phosphatase 44 - 121 IU/L 64 49 58  Total Bilirubin 0.0 - 1.2 mg/dL 0.6 0.6 0.5    Lab Results  Component Value Date/Time   TSH 2.940 05/17/2020 01:37 PM   TSH 2.730 01/03/2018 03:37 PM    CBC Latest Ref Rng & Units 05/17/2020 02/20/2020 05/15/2019  WBC 3.4 - 10.8 x10E3/uL 5.8 5.4 6.0  Hemoglobin 11.1 - 15.9 g/dL 13.6 12.9 13.2  Hematocrit 34.0 - 46.6 % 42.0 38.9 39.0  Platelets 150 - 450 x10E3/uL 179 162 178    No results found for: VD25OH  Clinical ASCVD: No  The 10-year ASCVD risk score Anna Juarez DC Jr., et al., 2013) is: 25.9%   Values used to calculate the score:     Age: 26 years     Sex: Female     Is Non-Hispanic African  American: No     Diabetic: Yes     Tobacco smoker: No     Systolic Blood Pressure: 885 mmHg     Is BP treated: Yes     HDL Cholesterol: 48 mg/dL     Total Cholesterol: 162 mg/dL    Depression screen [facility] 2/9 05/17/2020 05/15/2019 05/11/2019  Decreased Interest 0 0 0  Down, Depressed, Hopeless 0 0 0  PHQ - 2 Score 0 0 0  Altered sleeping - 0 -  Tired, decreased energy - 0 -  Change in appetite - 0 -  Feeling bad or failure about yourself  - 0 -  Trouble concentrating - 0 -  Moving slowly or fidgety/restless - 0 -  Suicidal thoughts - 0 -  PHQ-9 Score - 0 -       Social History   Tobacco Use  Smoking Status Never Smoker  Smokeless Tobacco Never Used    BP Readings from Last 3 Encounters:  05/17/20 125/73  04/02/20 (!) 141/76  02/20/20 (!) 145/70   Pulse Readings from Last 3 Encounters:  05/17/20 (!) 57  04/02/20 64  02/20/20 69   Wt Readings from Last 3 Encounters:  05/17/20 (!) 320 lb (145.2 kg)  04/02/20 (!) 321 lb (145.6 kg)  02/20/20 (!) 325 lb 6.4 oz (147.6 kg)    Assessment/Interventions: Review of patient past medical history, allergies, medications, health status, including review of consultants reports, laboratory and other test data, was performed as part of comprehensive evaluation and provision of chronic care management services.   SDOH:  (Social Determinants of Health) assessments and interventions performed: No     Immunization History  Administered Date(s) Administered  . Influenza, High Dose Seasonal PF 01/07/2016, 01/19/2017, 01/03/2018, 01/13/2019  . Influenza,inj,Quad PF,6+ Mos 01/02/2015  . Influenza-Unspecified 02/28/2014, 01/19/2017, 02/05/2020  . Moderna Sars-Covid-2 Vaccination 04/21/2019, 05/19/2019, 02/19/2020  . Pneumococcal Conjugate-13 02/28/2014  . Pneumococcal Polysaccharide-23 10/31/2015  . Pneumococcal-Unspecified 11/14/2004  . Tdap 09/13/2013    Conditions to be addressed/monitored:  Hypertension, Hyperlipidemia, Diabetes, Chronic Kidney Disease and peripheral neuropathya and obesity  Care Plan : [name]  Updates made by [name], [place] since 07/02/2020 12:00 AM    Problem: Dm, HTN, HLD, GERD, Obesity   Priority: High  Onset Date: 05/22/2020    Long-Range Goal: Disease management   Start Date: 05/17/2020  This Visit's Progress: On track  Priority: High  Note:   Current Barriers:  . Unable to independently afford treatment regimen . Unable to independently monitor therapeutic efficacy . Suboptimal therapeutic regimen for diabetes  . Lack of caregiver support  Pharmacist Clinical Goal(s):  Marland Kitchen Over the next 60 days, patient will verbalize ability to afford  treatment regimen . achieve adherence to monitoring guidelines and medication adherence to achieve therapeutic efficacy . achieve control of diabetes as evidenced by A1c and SMBG readings . contact provider office for questions/concerns as evidenced notation of same in electronic health record through collaboration with PharmD and provider.    Interventions: . 1:1 collaboration with [name], NP regarding development and update of comprehensive plan of care as evidenced by provider attestation and co-signature . Inter-disciplinary care team collaboration (see longitudinal plan of care) . Comprehensive medication review performed; medication list updated in electronic medical record Lab Results  Component Value Date/Time   HGBA1C 7.8 (H) 05/17/2020 01:36 PM   HGBA1C 7.4 (H) 11/10/2019 02:25 PM   HGBA1C 6.8 (H) 05/15/2019 02:01 PM   HGBA1C 8.0 09/13/2018 12:00 AM  HGBA1C 6.8 03/21/2018 01:32 PM   eGRF ~ 38m/min , historicall ~ 40 ml/min  Diabetes (A1c goal <7%) -Not ideally controlled -Current medications: . Farxiga 5 mg qd . Glipizide 5 mg bid . Lyrica 150 mg tid for neuropathy  -Medications previously tried: Victoza- N/V  -Current home glucose readings . fasting glucose: 200,217,241, 259, (not always fasting) . post prandial glucose: Not checking -Reports hyperglycemic symptoms feeling "woozy  Headed" since changing from JTongato FIran Reports her GI bloating and fullness issues have greatly improved after starting a probiotic. -Current exercise: Doesn't exercise -Educated onA1c and blood sugar goals; Complications of diabetes including kidney damage, retinal damage, and cardiovascular disease; Exercise goal of 150 minutes per week; Benefits of weight loss; Benefits of routine self-monitoring of blood sugar; -Counseled to check feet daily and get yearly eye exams -Counseled on diet and exercise extensively Recommended increase Farxiga dose to 10 mg  daily Recommend decreasing pregabalin to 100 mg tid for renal function (upcoming nephrology appt 06/13/20) Assessed patient finances. [place] voucher sent to pharmacy and PAP application for FRossmoormailed to patient.  Hyperlipidemia: (LDL goal < 70) -Not ideally controlled last ldl 83 -Current treatment: . Lovastatin 40 mg  -Medications previously tried: Unknown  --Educated on Benefits of statin for ASCVD risk reduction; Importance of limiting foods high in cholesterol; Exercise goal of 150 minutes per week; -Counseled on diet and exercise extensively Recommended consider high intensity statin since LDL trending up   BP Readings from Last 3 Encounters:  05/17/20 125/73  04/02/20 (!) 141/76  02/20/20 (!) 145/70    Hypertension (BP goal <130/80) -Not ideally controlled -Current treatment: . Benazepril 40 mg qd . Toprol XL 50 mg qd -Medications previously tried: NA  -Current home readings: not checking  -Denies hypotensive/hypertensive symptoms -Educated on BP goals and benefits of medications for prevention of heart attack, stroke and kidney damage; Daily salt intake goal < 2300 mg; Exercise goal of 150 minutes per week; Importance of home blood pressure monitoring; -Counseled to monitor BP at home 2-3 times weekly, document, and provide log at future appointments -Counseled on diet and exercise extensively Recommended to continue current medication  Osteoporosis / Osteopenia (Goal prevent fracture) -Controlled -Last DEXA Scan: 2015   T-Score femoral neck: -0.7  T-Score lumbar spine: 0.0  -Patient overdue for follow-up scan-- ordered by PCP -Current treatment  . None -Medications previously tried: na  -Recommend [phone] units of vitamin D daily. Recommend 1200 mg of calcium daily from dietary and supplemental sources. Recommend weight-bearing and muscle strengthening exercises for building and maintaining bone density. -Counseled on diet and exercise  extensively  Chronic low back pain (Goal: minimize symptoms avoid adverse effects) -Controlled -Current treatment  . Acetaminophen 500 mg prn  . Tramadole 50 mg q8h prn -Medications previously tried: na  -Recommended to continue current medication Counseled on avoiding NSAIDS due to reanl dysfunction  GERD/Reflux?? (Goal: minimize symptoms ) -Controlled -Current treatment  . esomeprazole 40 mg qd -Medications previously tried: na  -Recommended to continue current medication Recommended magnesium level with next labs Collaborated with PCP for GI referral  Patient reports bloating, delayed gastric emptying and fullness after eating for ~ 6 months. She denies any burning epigastric pain, tarry stools or discolored urine.     Patient Goals/Self-Care Activities . Over the next 60 days, patient will:  - take medications as prescribed check glucose daily, document, and provide at future appointments collaborate with provider on medication access solutions  Follow Up Plan: Telephone follow  up appointment with care management team member scheduled for: 2 months          Medication Assistance: Application for Farxiga  medication assistance program. in process.  Anticipated assistance start date unknown.  See plan of care for additional detail.  Patient's preferred pharmacy is:  [id number] [place] [place]Phone: [phone]: [phone] [facility] [place] [address] [place] [place]Phone: [phone]: [phone] Uses pill box? No - compliance packaging Pt endorses 99% compliance   Care Plan and Follow Up Patient Decision:  Patient agrees to Care Plan and Follow-up.  Plan: Telephone follow up appointment with care management team member scheduled for:  2 months  JJunita Push HKenton KingfisherPharmD, [phone]

## 2020-06-27 ENCOUNTER — Telehealth: Payer: Self-pay | Admitting: Pharmacist

## 2020-06-27 ENCOUNTER — Telehealth: Payer: Self-pay | Admitting: Nurse Practitioner

## 2020-06-27 MED ORDER — DAPAGLIFLOZIN PROPANEDIOL 10 MG PO TABS: 10.0000 mg | ORAL_TABLET | Freq: Every day | ORAL | 1 refills | Status: DC

## 2020-06-27 NOTE — Chronic Care Management (AMB) (Addendum)
    Chronic Care Management Pharmacy Assistant   Name: Anna Juarez  MRN: [id number] DOB: Mar 09, 1951  Reason for Encounter: Patient Assistance Documentation/Blood Glucose Readings     Medications: Outpatient Encounter Medications as of 06/27/2020  Medication Sig   acetaminophen (TYLENOL) 500 MG tablet Take 500 mg by mouth as needed.   benazepril (LOTENSIN) 40 MG tablet TAKE 1 TABLET BY MOUTH EVERY DAY   dapagliflozin propanediol (FARXIGA) 10 MG TABS tablet Take 1 tablet (10 mg total) by mouth daily before breakfast.   glipiZIDE (GLUCOTROL) 5 MG tablet Take 1 tablet (5 mg total) by mouth 2 (two) times daily before a meal.   glucose blood (ONETOUCH ULTRA) test strip 1 each by Other route as needed for other. Use as instructed   lovastatin (MEVACOR) 40 MG tablet TAKE 1 TABLET BY MOUTH EVERYDAY AT BEDTIME   metoprolol succinate (TOPROL-XL) 50 MG 24 hr tablet TAKE 1 TABLET (50 MG TOTAL) BY MOUTH DAILY. TAKE WITH OR IMMEDIATELY FOLLOWING A MEAL.   NEXIUM 40 MG capsule TAKE 1 CAPSULE BY MOUTH EVERY DAY   pregabalin (LYRICA) 150 MG capsule Take 1 capsule (150 mg total) by mouth 3 (three) times daily.   traMADol (ULTRAM) 50 MG tablet Take 1 tablet (50 mg total) by mouth every 8 (eight) hours as needed.   No facility-administered encounter medications on file as of 06/27/2020.   Patient states since her medication Wilder Glade has been increased to 10 mg instead of 5 she has been feeling much better. Patient states she no longer feels 'woozy headed". Patient states before increasing the medication to 10 mg a day she was having high readings between 200-260. Patient states now her sugars are below 200 around 189. Patient states she forget to check her sugars most of the time before she eats. Patient states although she forgets she can tell that they are no longer as high as she feels much better.  Patient states she has not yet received a patient assistance application in the mail for medication  Farxiga, I prefilled another application to be mailed out to her. Patient is aware.  [name], [place] Pharmacist Assistant [phone]   I have reviewed the care management and care coordination activities outlined in this encounter and I am certifying that I agree with the content of this note. Junita Push. Kenton Kingfisher PharmD, [facility] [phone]

## 2020-06-27 NOTE — Telephone Encounter (Signed)
-----   Message from [name], [facility] sent at 06/27/2020  1:34 PM EDT ----- Anna Juarez, Mrs. Saia would like a new prescription for Farxiga 10 mg qd sent to [facility] for delivery on 3/31. She reports her BG has improved on this dose. Thank You!

## 2020-06-27 NOTE — Telephone Encounter (Signed)
Medication sent to [facility].

## 2020-06-27 NOTE — Chronic Care Management (AMB) (Signed)
Chronic Care Management Pharmacy Assistant   Name: Anna Juarez  MRN: [id number] DOB: 05/17/1950  Reason for Encounter: Medication Review  Recent office visits:  n/a  Recent consult visits:  [place] visits:  None in previous 6 months  Medications: Outpatient Encounter Medications as of 06/27/2020  Medication Sig  . acetaminophen (TYLENOL) 500 MG tablet Take 500 mg by mouth as needed.  . benazepril (LOTENSIN) 40 MG tablet TAKE 1 TABLET BY MOUTH EVERY DAY  . dapagliflozin propanediol (FARXIGA) 5 MG TABS tablet Take 1 tablet (5 mg total) by mouth daily before breakfast.  . glipiZIDE (GLUCOTROL) 5 MG tablet Take 1 tablet (5 mg total) by mouth 2 (two) times daily before a meal.  . glucose blood (ONETOUCH ULTRA) test strip 1 each by Other route as needed for other. Use as instructed  . lovastatin (MEVACOR) 40 MG tablet TAKE 1 TABLET BY MOUTH EVERYDAY AT BEDTIME  . metoprolol succinate (TOPROL-XL) 50 MG 24 hr tablet TAKE 1 TABLET (50 MG TOTAL) BY MOUTH DAILY. TAKE WITH OR IMMEDIATELY FOLLOWING A MEAL.  Marland Kitchen NEXIUM 40 MG capsule TAKE 1 CAPSULE BY MOUTH EVERY DAY  . pregabalin (LYRICA) 150 MG capsule Take 1 capsule (150 mg total) by mouth 3 (three) times daily.  . traMADol (ULTRAM) 50 MG tablet Take 1 tablet (50 mg total) by mouth every 8 (eight) hours as needed.   No facility-administered encounter medications on file as of 06/27/2020.     Reviewed chart for medication changes ahead of medication coordination call.  No OVs, Consults, or hospital visits since last care coordination call/Pharmacist visit.  No medication changes indicated.  BP Readings from Last 3 Encounters:  05/17/20 125/73  04/02/20 (!) 141/76  02/20/20 (!) 145/70    Lab Results  Component Value Date   HGBA1C 7.8 (H) 05/17/2020     Patient obtains medications through Adherence Packaging  90 Days   Last adherence delivery included: .           Benazepril 40 mg: Take one tablet by mouth at  breakfast daily.  .           Pregabalin 150 mg: Take 1 capsule by mouth three times daily.  .           Januvia 100 mg: Take one tablet by mouth at breakfast daily.  .           Lovastatin 40 mg: Take one tablet by mouth at bedtime daily.  .           Glipizide 5 mg: Take 1 tablet by mouth at breakfast and at evening meal.  .           Metoprolol Succinate XL 50 mg: Take one tablet by mouth at breakfast daily.  .           Esomeprazole 40 mg: Take one capsule by mouth at breakfast daily.   Patient is due for next adherence delivery on: 08/06/2020. Called patient and reviewed medications.  This delivery to include: Patient will need a short fill of Farxiga, prior to adherence delivery. (To align with sync date.)   Coordinated acute fill for Farxiga to be delivered (03/31//2022).  Patient declined all other medications at this time as her adherence packaging is not due to be refilled until 08/06/2020.  Patient needs refills for Farxiga 10 mg tablets. We currently have 5 mg tablets on file. Patient is currently taking two tablets of 5 mg at the same  time. Patient wishes to have Rx for 10 mg tablets instead. She will need new prescription from [name], NP.  Confirmed delivery date of 07/04/2020, advised patient that pharmacy will contact them the morning of delivery.   [name], [place] Pharmacist Assistant [phone]

## 2020-07-02 NOTE — Patient Instructions (Addendum)
Visit Information  It was a pleasure speaking with you today. Thank you for letting me be part of your clinical team. Please call with any questions or concerns.   Goals Addressed   None     The patient verbalized understanding of instructions, educational materials, and care plan provided today and agreed to receive a mailed copy of patient instructions, educational materials, and care plan.   Telephone follow up appointment with pharmacy team member scheduled for: 45months Pharmd , monthly CPA  Junita Push. Jasai Sorg PharmD, BCPS Clinical Pharmacist [phone]  Diabetes Mellitus and Nutrition, Adult When you have diabetes, or diabetes mellitus, it is very important to have healthy eating habits because your blood sugar (glucose) levels are greatly affected by what you eat and drink. Eating healthy foods in the right amounts, at about the same times every day, can help you:  Control your blood glucose.  Lower your risk of heart disease.  Improve your blood pressure.  Reach or maintain a healthy weight. What can affect my meal plan? Every person with diabetes is different, and each person has different needs for a meal plan. Your health care provider may recommend that you work with a dietitian to make a meal plan that is best for you. Your meal plan may vary depending on factors such as:  The calories you need.  The medicines you take.  Your weight.  Your blood glucose, blood pressure, and cholesterol levels.  Your activity level.  Other health conditions you have, such as heart or kidney disease. How do carbohydrates affect me? Carbohydrates, also called carbs, affect your blood glucose level more than any other type of food. Eating carbs naturally raises the amount of glucose in your blood. Carb counting is a method for keeping track of how many carbs you eat. Counting carbs is important to keep your blood glucose at a healthy level, especially if you use insulin or take  certain oral diabetes medicines. It is important to know how many carbs you can safely have in each meal. This is different for every person. Your dietitian can help you calculate how many carbs you should have at each meal and for each snack. How does alcohol affect me? Alcohol can cause a sudden decrease in blood glucose (hypoglycemia), especially if you use insulin or take certain oral diabetes medicines. Hypoglycemia can be a life-threatening condition. Symptoms of hypoglycemia, such as sleepiness, dizziness, and confusion, are similar to symptoms of having too much alcohol.  Do not drink alcohol if: ? Your health care provider tells you not to drink. ? You are pregnant, may be pregnant, or are planning to become pregnant.  If you drink alcohol: ? Do not drink on an empty stomach. ? Limit how much you use to:  0-1 drink a day for women.  0-2 drinks a day for men. ? Be aware of how much alcohol is in your drink. In the [place] one drink equals one 12 oz bottle of beer (355 mL), one 5 oz glass of wine (148 mL), or one 1 oz glass of hard liquor (44 mL). ? Keep yourself hydrated with water, diet soda, or unsweetened iced tea.  Keep in mind that regular soda, juice, and other mixers may contain a lot of sugar and must be counted as carbs. What are tips for following this plan? Reading food labels  Start by checking the serving size on the "Nutrition Facts" label of packaged foods and drinks. The amount of calories, carbs, fats, and  other nutrients listed on the label is based on one serving of the item. Many items contain more than one serving per package.  Check the total grams (g) of carbs in one serving. You can calculate the number of servings of carbs in one serving by dividing the total carbs by 15. For example, if a food has 30 g of total carbs per serving, it would be equal to 2 servings of carbs.  Check the number of grams (g) of saturated fats and trans fats in one serving. Choose  foods that have a low amount or none of these fats.  Check the number of milligrams (mg) of salt (sodium) in one serving. Most people should limit total sodium intake to less than 2,300 mg per day.  Always check the nutrition information of foods labeled as "low-fat" or "nonfat." These foods may be higher in added sugar or refined carbs and should be avoided.  Talk to your dietitian to identify your daily goals for nutrients listed on the label. Shopping  Avoid buying canned, pre-made, or processed foods. These foods tend to be high in fat, sodium, and added sugar.  Shop around the outside edge of the grocery store. This is where you will most often find fresh fruits and vegetables, bulk grains, fresh meats, and fresh dairy. Cooking  Use low-heat cooking methods, such as baking, instead of high-heat cooking methods like deep frying.  Cook using healthy oils, such as olive, canola, or sunflower oil.  Avoid cooking with butter, cream, or high-fat meats. Meal planning  Eat meals and snacks regularly, preferably at the same times every day. Avoid going long periods of time without eating.  Eat foods that are high in fiber, such as fresh fruits, vegetables, beans, and whole grains. Talk with your dietitian about how many servings of carbs you can eat at each meal.  Eat 4-6 oz (112-168 g) of lean protein each day, such as lean meat, chicken, fish, eggs, or tofu. One ounce (oz) of lean protein is equal to: ? 1 oz (28 g) of meat, chicken, or fish. ? 1 egg. ?  cup (62 g) of tofu.  Eat some foods each day that contain healthy fats, such as avocado, nuts, seeds, and fish.   What foods should I eat? Fruits Berries. Apples. Oranges. Peaches. Apricots. Plums. Grapes. Mango. Papaya. Pomegranate. Kiwi. Cherries. Vegetables Lettuce. Spinach. Leafy greens, including kale, chard, collard greens, and mustard greens. Beets. Cauliflower. Cabbage. Broccoli. Carrots. Green beans. Tomatoes. Peppers.  Onions. Cucumbers. Brussels sprouts. Grains Whole grains, such as whole-wheat or whole-grain bread, crackers, tortillas, cereal, and pasta. Unsweetened oatmeal. Quinoa. Brown or wild rice. Meats and other proteins Seafood. Poultry without skin. Lean cuts of poultry and beef. Tofu. Nuts. Seeds. Dairy Low-fat or fat-free dairy products such as milk, yogurt, and cheese. The items listed above may not be a complete list of foods and beverages you can eat. Contact a dietitian for more information. What foods should I avoid? Fruits Fruits canned with syrup. Vegetables Canned vegetables. Frozen vegetables with butter or cream sauce. Grains Refined white flour and flour products such as bread, pasta, snack foods, and cereals. Avoid all processed foods. Meats and other proteins Fatty cuts of meat. Poultry with skin. Breaded or fried meats. Processed meat. Avoid saturated fats. Dairy Full-fat yogurt, cheese, or milk. Beverages Sweetened drinks, such as soda or iced tea. The items listed above may not be a complete list of foods and beverages you should avoid. Contact a dietitian for  more information. Questions to ask a health care provider  Do I need to meet with a diabetes educator?  Do I need to meet with a dietitian?  What number can I call if I have questions?  When are the best times to check my blood glucose? Where to find more information:  American Diabetes Association: diabetes.org  Academy of Nutrition and Dietetics: www.eatright.[employer] of Diabetes and Digestive and Kidney Diseases: [employer]  Association of Diabetes Care and Education Specialists: www.diabeteseducator.org Summary  It is important to have healthy eating habits because your blood sugar (glucose) levels are greatly affected by what you eat and drink.  A healthy meal plan will help you control your blood glucose and maintain a healthy lifestyle.  Your health care provider may  recommend that you work with a dietitian to make a meal plan that is best for you.  Keep in mind that carbohydrates (carbs) and alcohol have immediate effects on your blood glucose levels. It is important to count carbs and to use alcohol carefully. This information is not intended to replace advice given to you by your health care provider. Make sure you discuss any questions you have with your health care provider. Document Revised: 02/28/2019 Document Reviewed: 02/28/2019 Elsevier Patient Education  2021 [employer].

## 2020-07-03 ENCOUNTER — Telehealth: Payer: Self-pay | Admitting: Pharmacist

## 2020-07-03 NOTE — Progress Notes (Signed)
    Chronic Care Management Pharmacy Assistant   Name: Anna Juarez  MRN: [id number] DOB: 08/29/1950.  Reason for Encounter:Patient Assistant Application- Farxiga   Medications: Outpatient Encounter Medications as of 07/03/2020  Medication Sig  . acetaminophen (TYLENOL) 500 MG tablet Take 500 mg by mouth as needed.  . benazepril (LOTENSIN) 40 MG tablet TAKE 1 TABLET BY MOUTH EVERY DAY  . dapagliflozin propanediol (FARXIGA) 10 MG TABS tablet Take 1 tablet (10 mg total) by mouth daily before breakfast.  . glipiZIDE (GLUCOTROL) 5 MG tablet Take 1 tablet (5 mg total) by mouth 2 (two) times daily before a meal.  . glucose blood (ONETOUCH ULTRA) test strip 1 each by Other route as needed for other. Use as instructed  . lovastatin (MEVACOR) 40 MG tablet TAKE 1 TABLET BY MOUTH EVERYDAY AT BEDTIME  . metoprolol succinate (TOPROL-XL) 50 MG 24 hr tablet TAKE 1 TABLET (50 MG TOTAL) BY MOUTH DAILY. TAKE WITH OR IMMEDIATELY FOLLOWING A MEAL.  Marland Kitchen NEXIUM 40 MG capsule TAKE 1 CAPSULE BY MOUTH EVERY DAY  . pregabalin (LYRICA) 150 MG capsule Take 1 capsule (150 mg total) by mouth 3 (three) times daily.  . traMADol (ULTRAM) 50 MG tablet Take 1 tablet (50 mg total) by mouth every 8 (eight) hours as needed.   No facility-administered encounter medications on file as of 07/03/2020.   Prepared application to be mailed out 07/03/20  Called and informed patient that I will be mailing patient assitant application for Farxiga. Informed patient to return application to office once completed.  Georgiana Shore ,[place] Pharmacist Assistant [phone]

## 2020-07-09 ENCOUNTER — Telehealth: Payer: Self-pay

## 2020-07-09 NOTE — Chronic Care Management (AMB) (Signed)
    Chronic Care Management Pharmacy Assistant   Name: Anna Juarez  MRN: [id number] DOB: 1950-09-02   Reason for Encounter: Medication Review-Medication Coordination    Recent office visits:  None  Recent consult visits:  None  Hospital visits:  None in previous 6 months  Medications: Outpatient Encounter Medications as of 07/09/2020  Medication Sig  . acetaminophen (TYLENOL) 500 MG tablet Take 500 mg by mouth as needed.  . benazepril (LOTENSIN) 40 MG tablet TAKE 1 TABLET BY MOUTH EVERY DAY  . dapagliflozin propanediol (FARXIGA) 10 MG TABS tablet Take 1 tablet (10 mg total) by mouth daily before breakfast.  . glipiZIDE (GLUCOTROL) 5 MG tablet Take 1 tablet (5 mg total) by mouth 2 (two) times daily before a meal.  . glucose blood (ONETOUCH ULTRA) test strip 1 each by Other route as needed for other. Use as instructed  . lovastatin (MEVACOR) 40 MG tablet TAKE 1 TABLET BY MOUTH EVERYDAY AT BEDTIME  . metoprolol succinate (TOPROL-XL) 50 MG 24 hr tablet TAKE 1 TABLET (50 MG TOTAL) BY MOUTH DAILY. TAKE WITH OR IMMEDIATELY FOLLOWING A MEAL.  Marland Kitchen NEXIUM 40 MG capsule TAKE 1 CAPSULE BY MOUTH EVERY DAY  . pregabalin (LYRICA) 150 MG capsule Take 1 capsule (150 mg total) by mouth 3 (three) times daily.  . traMADol (ULTRAM) 50 MG tablet Take 1 tablet (50 mg total) by mouth every 8 (eight) hours as needed.   No facility-administered encounter medications on file as of 07/09/2020.    Reviewed chart for medication changes ahead of medication coordination call.  No OVs, Consults, or hospital visits since last care coordination call/Pharmacist visit. (If appropriate, list visit date, provider name)  No medication changes indicated OR if recent visit, treatment plan here.  BP Readings from Last 3 Encounters:  05/17/20 125/73  04/02/20 (!) 141/76  02/20/20 (!) 145/70    Lab Results  Component Value Date   HGBA1C 7.8 (H) 05/17/2020     Patient obtains medications through Adherence  Packaging  90 Days   Last adherence delivery included:  Benazepril 40 mg: Take one tablet by mouth at breakfast daily. Pregabalin 150 mg: Take 1 capsule by mouth three times daily.  Januvia 100 mg: Take one tablet by mouth at breakfast daily.  Lovastatin 40 mg: Take one tablet by mouth at bedtime daily.  Glipizide 5 mg: Take 1 tablet by mouth at breakfast and at evening meal.  Metoprolol Succinate XL 50 mg: Take one tablet by mouth at breakfast daily.  Esomeprazole 40 mg: Take one capsule by mouth at breakfast daily.  .  Called patient and reviewed medications. Patient is not due for any medications until the end of the month.   Star Rating Drugs: Lovastatin 40 mg Glipizide 5 mg Januvia 100 mg Benazepril 40 mg  [facility] Clinical Pharmacist Assistant [phone]

## 2020-08-02 ENCOUNTER — Telehealth: Payer: Self-pay | Admitting: Pharmacist

## 2020-08-02 NOTE — Progress Notes (Unsigned)
    Chronic Care Management Pharmacy Assistant   Name: Anna Juarez  MRN: [id number] DOB: 1950-05-19   Reason for Encounter:Medication Cordination Call   Medications: Outpatient Encounter Medications as of 08/02/2020  Medication Sig  . acetaminophen (TYLENOL) 500 MG tablet Take 500 mg by mouth as needed.  . benazepril (LOTENSIN) 40 MG tablet TAKE 1 TABLET BY MOUTH EVERY DAY  . dapagliflozin propanediol (FARXIGA) 10 MG TABS tablet Take 1 tablet (10 mg total) by mouth daily before breakfast.  . glipiZIDE (GLUCOTROL) 5 MG tablet Take 1 tablet (5 mg total) by mouth 2 (two) times daily before a meal.  . glucose blood (ONETOUCH ULTRA) test strip 1 each by Other route as needed for other. Use as instructed  . lovastatin (MEVACOR) 40 MG tablet TAKE 1 TABLET BY MOUTH EVERYDAY AT BEDTIME  . metoprolol succinate (TOPROL-XL) 50 MG 24 hr tablet TAKE 1 TABLET (50 MG TOTAL) BY MOUTH DAILY. TAKE WITH OR IMMEDIATELY FOLLOWING A MEAL.  Marland Kitchen NEXIUM 40 MG capsule TAKE 1 CAPSULE BY MOUTH EVERY DAY  . pregabalin (LYRICA) 150 MG capsule Take 1 capsule (150 mg total) by mouth 3 (three) times daily.  . traMADol (ULTRAM) 50 MG tablet Take 1 tablet (50 mg total) by mouth every 8 (eight) hours as needed.   No facility-administered encounter medications on file as of 08/02/2020.   Reviewed chart for medication changes ahead of medication coordination call.  No OVs, Consults, or hospital visits since last care coordination call/Pharmacist visit. (If appropriate, list visit date, provider name)  No medication changes indicated OR if recent visit, treatment plan here.  BP Readings from Last 3 Encounters:  05/17/20 125/73  04/02/20 (!) 141/76  02/20/20 (!) 145/70    Lab Results  Component Value Date   HGBA1C 7.8 (H) 05/17/2020     Patient obtains medications through Adherence Packaging  90 Days   Patient is due for next adherence delivery on: 08/09/20 Called patient and reviewed medications and coordinated  delivery.  This delivery to include: Pregablin 150mg  Capsule Take one capsule three times daily Janivia 100mg  Cap Take one tab by mouth once daily Esomeprazole Magnesium 40mg  Capsule Take one tab by mouth every morning Benazepril 40mg  Take one tab by mouth every morning Glipizide 5mg  Tab Take one tab by mouth every morning and take one tab by mouth every Lovastatin 40mg  Tab Take one tab everyday at bedtime Metoprolol Succinate ER 50mg  Tab Take one tab every morning   Patient needs refills for : Esomeprazole Magnesium 40mg  Capsule Take one tab by mouth every morning  Confirmed delivery date of 08/09/20, advised patient that pharmacy will contact them the morning of delivery.   Star Rating Drugs: Benazepril 40mg  Take one tab by mouth every morning  Last filled 05/10/20 90D Lovastatin 40mg  Tab Take one tab everyday at bedtime  Lat filled  05/08/20 [place] ,[place] Pharmacist Assistant [phone]

## 2020-08-05 ENCOUNTER — Ambulatory Visit (INDEPENDENT_AMBULATORY_CARE_PROVIDER_SITE_OTHER): Payer: Medicare PPO

## 2020-08-05 VITALS — Ht 62.0 in | Wt 320.0 lb

## 2020-08-05 DIAGNOSIS — Z0000 Encounter for general adult medical examination without abnormal findings: Secondary | ICD-10-CM

## 2020-08-05 NOTE — Progress Notes (Signed)
I connected with Anna Juarez today by telephone and verified that I am speaking with the correct person using two identifiers. Location patient: home Location provider: work Persons participating in the virtual visit: Anna Karoline Caldwell LPN.   I discussed the limitations, risks, security and privacy concerns of performing an evaluation and management service by telephone and the availability of in person appointments. I also discussed with the patient that there may be a patient responsible charge related to this service. The patient expressed understanding and verbally consented to this telephonic visit.    Interactive audio and video telecommunications were attempted between this provider and patient, however failed, due to patient having technical difficulties OR patient did not have access to video capability.  We continued and completed visit with audio only.     Vital signs may be patient reported or missing.  Subjective:   Anna Juarez is a 70 y.o. female who presents for Medicare Annual (Subsequent) preventive examination.  Review of Systems     Cardiac Risk Factors include: advanced age (>47men, >9 women);diabetes mellitus;dyslipidemia;hypertension;obesity (BMI >30kg/m2);sedentary lifestyle     Objective:    Today's Vitals   08/05/20 1341 08/05/20 1342  Weight: (!) 320 lb (145.2 kg)   Height: 5\' 2"  (1.575 m)   PainSc:  3    Body mass index is 58.53 kg/m.  Advanced Directives 08/05/2020 03/05/2020 02/20/2020 05/11/2019  Does Patient Have a Medical Advance Directive? Yes Yes Yes Yes  Type of Paramedic of [place];Living will [place];Living will [place];Living will Living will;Healthcare Power of Attorney  Does patient want to make changes to medical advance directive? - Yes (MAU/Ambulatory/Procedural Areas - Information given) No - Patient declined -  Copy of [place] in  Chart? No - copy requested - No - copy requested No - copy requested    Current Medications (verified) Outpatient Encounter Medications as of 08/05/2020  Medication Sig  . acetaminophen (TYLENOL) 500 MG tablet Take 500 mg by mouth as needed.  . benazepril (LOTENSIN) 40 MG tablet TAKE 1 TABLET BY MOUTH EVERY DAY  . dapagliflozin propanediol (FARXIGA) 10 MG TABS tablet Take 1 tablet (10 mg total) by mouth daily before breakfast.  . glipiZIDE (GLUCOTROL) 5 MG tablet Take 1 tablet (5 mg total) by mouth 2 (two) times daily before a meal.  . glucose blood (ONETOUCH ULTRA) test strip 1 each by Other route as needed for other. Use as instructed  . lovastatin (MEVACOR) 40 MG tablet TAKE 1 TABLET BY MOUTH EVERYDAY AT BEDTIME  . metoprolol succinate (TOPROL-XL) 50 MG 24 hr tablet TAKE 1 TABLET (50 MG TOTAL) BY MOUTH DAILY. TAKE WITH OR IMMEDIATELY FOLLOWING A MEAL.  Marland Kitchen NEXIUM 40 MG capsule TAKE 1 CAPSULE BY MOUTH EVERY DAY  . pregabalin (LYRICA) 150 MG capsule Take 1 capsule (150 mg total) by mouth 3 (three) times daily.  . traMADol (ULTRAM) 50 MG tablet Take 1 tablet (50 mg total) by mouth every 8 (eight) hours as needed.   No facility-administered encounter medications on file as of 08/05/2020.    Allergies (verified) Actos [pioglitazone], Metformin and related, and Victoza [liraglutide]   History: Past Medical History:  Diagnosis Date  . Chronic kidney disease   . Diabetes mellitus without complication ([place])   . Extreme obesity   . Fatty liver   . Hyperlipidemia   . Hypertension   . Radiculopathy, cervical   . Renal insufficiency    pt states  she has chronic kidney disease.    Past Surgical History:  Procedure Laterality Date  . ABDOMINAL HYSTERECTOMY    . JOINT REPLACEMENT    . SPINE SURGERY     Family History  Problem Relation Age of Onset  . Diabetes Mother   . Heart disease Mother   . Hypertension Mother   . Heart disease Father   . Cancer Brother   . Hypertension Brother   .  Breast cancer Maternal Aunt   . Breast cancer Paternal Aunt    Social History   Socioeconomic History  . Marital status: Married    Spouse name: Not on file  . Number of children: Not on file  . Years of education: Not on file  . Highest education level: 12th grade  Occupational History  . Occupation: retired   Tobacco Use  . Smoking status: Never Smoker  . Smokeless tobacco: Never Used  Vaping Use  . Vaping Use: Never used  Substance and Sexual Activity  . Alcohol use: No  . Drug use: No  . Sexual activity: Not on file  Other Topics Concern  . Not on file  Social History Narrative   [place]; lives with husband; never smoker; no alcohol; retd. Manufacturing.    Social Determinants of Health   Financial Resource Strain: Low Risk   . Difficulty of Paying Living Expenses: Not hard at all  Food Insecurity: No Food Insecurity  . Worried About Charity fundraiser in the Last Year: Never true  . Ran Out of Food in the Last Year: Never true  Transportation Needs: No Transportation Needs  . Lack of Transportation (Medical): No  . Lack of Transportation (Non-Medical): No  Physical Activity: Inactive  . Days of Exercise per Week: 0 days  . Minutes of Exercise per Session: 0 min  Stress: No Stress Concern Present  . Feeling of Stress : Not at all  Social Connections: Not on file    Tobacco Counseling Counseling given: Not Answered   Clinical Intake:  Pre-visit preparation completed: Yes  Pain : 0-10 Pain Score: 3  Pain Type: Chronic pain Pain Location: Back Pain Orientation: Lower Pain Descriptors / Indicators: Aching Pain Onset: More than a month ago Pain Frequency: Constant     Nutritional Status: BMI > 30  Obese Nutritional Risks: None Diabetes: Yes  How often do you need to have someone help you when you read instructions, pamphlets, or other written materials from your doctor or pharmacy?: 1 - Never What is the last grade level you  completed in school?: 12th grade  Diabetic? Yes Nutrition Risk Assessment:  Has the patient had any N/V/D within the last 2 months?  No  Does the patient have any non-healing wounds?  No  Has the patient had any unintentional weight loss or weight gain?  No   Diabetes:  Is the patient diabetic?  Yes  If diabetic, was a CBG obtained today?  No  Did the patient bring in their glucometer from home?  No  How often do you monitor your CBG's? With symptoms.   Financial Strains and Diabetes Management:  Are you having any financial strains with the device, your supplies or your medication? No .  Does the patient want to be seen by Chronic Care Management for management of their diabetes?  No  Would the patient like to be referred to a Nutritionist or for Diabetic Management?  No   Diabetic Exams:  Diabetic Eye Exam: Overdue  for diabetic eye exam. Pt has been advised about the importance in completing this exam. Patient advised to call and schedule an eye exam. Diabetic Foot Exam: Completed 05/17/2020   Interpreter Needed?: No  Information entered by :: NAllen LPN   Activities of Daily Living In your present state of health, do you have any difficulty performing the following activities: 08/05/2020 05/17/2020  Hearing? N Y  Vision? Y N  Comment blurry sometimes, has cataracts can't drive at night -  Difficulty concentrating or making decisions? N Y  Walking or climbing stairs? Y N  Dressing or bathing? N N  Doing errands, shopping? N N  Preparing Food and eating ? N -  Using the Toilet? N -  In the past six months, have you accidently leaked urine? Y -  Do you have problems with loss of bowel control? N -  Managing your Medications? N -  Managing your Finances? N -  Housekeeping or managing your Housekeeping? N -  Some recent data might be hidden    Patient Care Team: [name], NP as PCP - General Lucilla Lame, MD as Consulting Physician (Gastroenterology) [name], MD as Consulting Physician (Orthopedic Surgery) [name], [facility] (Pharmacist) [name], [facility] (Pharmacist)  Indicate any recent Medical Services you may have received from other than [facility] providers in the past year (date may be approximate).     Assessment:   This is a routine wellness examination for Mindy.  Hearing/Vision screen No exam data present  Dietary issues and exercise activities discussed: Current Exercise Habits: The patient does not participate in regular exercise at present  Goals Addressed            This Visit's Progress   . Patient Stated       08/05/2020, no goals      Depression Screen PHQ 2/9 Scores 08/05/2020 05/17/2020 05/15/2019 05/11/2019 01/03/2018 05/26/2017 11/24/2016  PHQ - 2 Score 0 0 0 0 0 0 0  PHQ- 9 Score - - 0 - - - -    Fall Risk Fall Risk  08/05/2020 05/17/2020 05/11/2019 03/21/2018 01/03/2018  Falls in the past year? 0 0 0 0 No  Number falls in past yr: - 0 0 - -  Injury with Fall? - 0 0 - -  Risk for fall due to : Medication side effect History of fall(s);No Fall Risks - - -  Follow up Falls evaluation completed;Education provided;Falls prevention discussed Falls evaluation completed - Falls evaluation completed -    FALL RISK PREVENTION PERTAINING TO THE HOME:  Any stairs in or around the home? Yes  If so, are there any without handrails? No  Home free of loose throw rugs in walkways, pet beds, electrical cords, etc? Yes  Adequate lighting in your home to reduce risk of falls? Yes   ASSISTIVE DEVICES UTILIZED TO PREVENT FALLS:  Life alert? No  Use of a cane, walker or w/c? No  Grab bars in the bathroom? Yes  Shower chair or bench in shower? Yes  Elevated toilet seat or a handicapped toilet? Yes   TIMED UP AND GO:  Was the test performed? No .    Cognitive Function:     6CIT Screen 08/05/2020 01/03/2018  What Year? 0 points 0 points  What month? 0 points 0 points  What time? 0 points 0 points  Count back from 20 0  points 0 points  Months in reverse 2 points 0 points  Repeat phrase 8 points  0 points  Total Score 10 0    Immunizations Immunization History  Administered Date(s) Administered  . Influenza, High Dose Seasonal PF 01/07/2016, 01/19/2017, 01/03/2018, 01/13/2019  . Influenza,inj,Quad PF,6+ Mos 01/02/2015  . Influenza-Unspecified 02/28/2014, 01/19/2017, 02/05/2020  . Moderna Sars-Covid-2 Vaccination 04/21/2019, 05/19/2019, 02/19/2020  . Pneumococcal Conjugate-13 02/28/2014  . Pneumococcal Polysaccharide-23 10/31/2015  . Pneumococcal-Unspecified 11/14/2004  . Tdap 09/13/2013    TDAP status: Up to date  Flu Vaccine status: Up to date  Pneumococcal vaccine status: Up to date  Covid-19 vaccine status: Completed vaccines  Qualifies for Shingles Vaccine? Yes   Zostavax completed No   Shingrix Completed?: No.    Education has been provided regarding the importance of this vaccine. Patient has been advised to call insurance company to determine out of pocket expense if they have not yet received this vaccine. Advised may also receive vaccine at local pharmacy or Health Dept. Verbalized acceptance and understanding.  Screening Tests Health Maintenance  Topic Date Due  . MAMMOGRAM  01/25/2020  . OPHTHALMOLOGY EXAM  04/27/2020  . COLONOSCOPY (Pts 45-30yrs Insurance coverage will need to be confirmed)  11/09/2020 (Originally 06/13/2019)  . COVID-19 Vaccine (4 - Booster for Moderna series) 08/18/2020  . INFLUENZA VACCINE  11/04/2020  . HEMOGLOBIN A1C  11/14/2020  . FOOT EXAM  05/17/2021  . TETANUS/TDAP  09/14/2023  . DEXA SCAN  Completed  . Hepatitis C Screening  Completed  . PNA vac Low Risk Adult  Completed  . HPV VACCINES  Aged Out    Health Maintenance  Health Maintenance Due  Topic Date Due  . MAMMOGRAM  01/25/2020  . OPHTHALMOLOGY EXAM  04/27/2020    Colorectal cancer screening: decline  Mammogram status: Ordered 05/17/2020. Pt provided with contact info and advised to  call to schedule appt.   Bone Density status: Ordered 05/17/2020. Pt provided with contact info and advised to call to schedule appt.  Lung Cancer Screening: (Low Dose CT Chest recommended if Age 46-80 years, 30 pack-year currently smoking OR have quit w/in 15years.) does not qualify.   Lung Cancer Screening Referral: no  Additional Screening:  Hepatitis C Screening: does qualify; Completed 10/31/2015  Vision Screening: Recommended annual ophthalmology exams for early detection of glaucoma and other disorders of the eye. Is the patient up to date with their annual eye exam?  No  Who is the provider or what is the name of the office in which the patient attends annual eye exams? [facility] If pt is not established with a provider, would they like to be referred to a provider to establish care? No .   Dental Screening: Recommended annual dental exams for proper oral hygiene  Community Resource Referral / Chronic Care Management: CRR required this visit?  No   CCM required this visit?  No      Plan:     I have personally reviewed and noted the following in the patient's chart:   . Medical and social history . Use of alcohol, tobacco or illicit drugs  . Current medications and supplements including opioid prescriptions.  . Functional ability and status . Nutritional status . Physical activity . Advanced directives . List of other physicians . Hospitalizations, surgeries, and ER visits in previous 12 months . Vitals . Screenings to include cognitive, depression, and falls . Referrals and appointments  In addition, I have reviewed and discussed with patient certain preventive protocols, quality metrics, and best practice recommendations. A written personalized care plan for preventive services as well  as general preventive health recommendations were provided to patient.     Kellie Simmering, LPN   [id number]   Nurse Notes:

## 2020-08-05 NOTE — Patient Instructions (Signed)
Anna Juarez , Thank you for taking time to come for your Medicare Wellness Visit. I appreciate your ongoing commitment to your health goals. Please review the following plan we discussed and let me know if I can assist you in the future.   Screening recommendations/referrals: Colonoscopy: decline Mammogram: patient to schedule Bone Density: patient to schedule Recommended yearly ophthalmology/optometry visit for glaucoma screening and checkup Recommended yearly dental visit for hygiene and checkup  Vaccinations: Influenza vaccine: completed 02/05/2020, due 11/04/2020 Pneumococcal vaccine: completed 10/31/2015 Tdap vaccine: completed 09/13/2013, due 09/14/2023 Shingles vaccine: discussed   Covid-19: 02/19/2020, 05/19/2019, 04/21/2019  Advanced directives: Please bring a copy of your POA (Power of Attorney) and/or Living Will to your next appointment.   Conditions/risks identified: none  Next appointment: Follow up in one year for your annual wellness visit    Preventive Care 65 Years and Older, Female Preventive care refers to lifestyle choices and visits with your health care provider that can promote health and wellness. What does preventive care include?  A yearly physical exam. This is also called an annual well check.  Dental exams once or twice a year.  Routine eye exams. Ask your health care provider how often you should have your eyes checked.  Personal lifestyle choices, including:  Daily care of your teeth and gums.  Regular physical activity.  Eating a healthy diet.  Avoiding tobacco and drug use.  Limiting alcohol use.  Practicing safe sex.  Taking low-dose aspirin every day.  Taking vitamin and mineral supplements as recommended by your health care provider. What happens during an annual well check? The services and screenings done by your health care provider during your annual well check will depend on your age, overall health, lifestyle risk factors, and  family history of disease. Counseling  Your health care provider may ask you questions about your:  Alcohol use.  Tobacco use.  Drug use.  Emotional well-being.  Home and relationship well-being.  Sexual activity.  Eating habits.  History of falls.  Memory and ability to understand (cognition).  Work and work Statistician.  Reproductive health. Screening  You may have the following tests or measurements:  Height, weight, and BMI.  Blood pressure.  Lipid and cholesterol levels. These may be checked every 5 years, or more frequently if you are over 71 years old.  Skin check.  Lung cancer screening. You may have this screening every year starting at age 9 if you have a 30-pack-year history of smoking and currently smoke or have quit within the past 15 years.  Fecal occult blood test (FOBT) of the stool. You may have this test every year starting at age 74.  Flexible sigmoidoscopy or colonoscopy. You may have a sigmoidoscopy every 5 years or a colonoscopy every 10 years starting at age 72.  Hepatitis C blood test.  Hepatitis B blood test.  Sexually transmitted disease (STD) testing.  Diabetes screening. This is done by checking your blood sugar (glucose) after you have not eaten for a while (fasting). You may have this done every 1-3 years.  Bone density scan. This is done to screen for osteoporosis. You may have this done starting at age 29.  Mammogram. This may be done every 1-2 years. Talk to your health care provider about how often you should have regular mammograms. Talk with your health care provider about your test results, treatment options, and if necessary, the need for more tests. Vaccines  Your health care provider may recommend certain vaccines, such as:  Influenza vaccine. This is recommended every year.  Tetanus, diphtheria, and acellular pertussis (Tdap, Td) vaccine. You may need a Td booster every 10 years.  Zoster vaccine. You may need this  after age 28.  Pneumococcal 13-valent conjugate (PCV13) vaccine. One dose is recommended after age 10.  Pneumococcal polysaccharide (PPSV23) vaccine. One dose is recommended after age 85. Talk to your health care provider about which screenings and vaccines you need and how often you need them. This information is not intended to replace advice given to you by your health care provider. Make sure you discuss any questions you have with your health care provider. Document Released: 04/19/2015 Document Revised: 12/11/2015 Document Reviewed: 01/22/2015 Elsevier Interactive Patient Education  2017 [place] Prevention in the Home Falls can cause injuries. They can happen to people of all ages. There are many things you can do to make your home safe and to help prevent falls. What can I do on the outside of my home?  Regularly fix the edges of walkways and driveways and fix any cracks.  Remove anything that might make you trip as you walk through a door, such as a raised step or threshold.  Trim any bushes or trees on the path to your home.  Use bright outdoor lighting.  Clear any walking paths of anything that might make someone trip, such as rocks or tools.  Regularly check to see if handrails are loose or broken. Make sure that both sides of any steps have handrails.  Any raised decks and porches should have guardrails on the edges.  Have any leaves, snow, or ice cleared regularly.  Use sand or salt on walking paths during winter.  Clean up any spills in your garage right away. This includes oil or grease spills. What can I do in the bathroom?  Use night lights.  Install grab bars by the toilet and in the tub and shower. Do not use towel bars as grab bars.  Use non-skid mats or decals in the tub or shower.  If you need to sit down in the shower, use a plastic, non-slip stool.  Keep the floor dry. Clean up any water that spills on the floor as soon as it  happens.  Remove soap buildup in the tub or shower regularly.  Attach bath mats securely with double-sided non-slip rug tape.  Do not have throw rugs and other things on the floor that can make you trip. What can I do in the bedroom?  Use night lights.  Make sure that you have a light by your bed that is easy to reach.  Do not use any sheets or blankets that are too big for your bed. They should not hang down onto the floor.  Have a firm chair that has side arms. You can use this for support while you get dressed.  Do not have throw rugs and other things on the floor that can make you trip. What can I do in the kitchen?  Clean up any spills right away.  Avoid walking on wet floors.  Keep items that you use a lot in easy-to-reach places.  If you need to reach something above you, use a strong step stool that has a grab bar.  Keep electrical cords out of the way.  Do not use floor polish or wax that makes floors slippery. If you must use wax, use non-skid floor wax.  Do not have throw rugs and other things on the floor that  can make you trip. What can I do with my stairs?  Do not leave any items on the stairs.  Make sure that there are handrails on both sides of the stairs and use them. Fix handrails that are broken or loose. Make sure that handrails are as long as the stairways.  Check any carpeting to make sure that it is firmly attached to the stairs. Fix any carpet that is loose or worn.  Avoid having throw rugs at the top or bottom of the stairs. If you do have throw rugs, attach them to the floor with carpet tape.  Make sure that you have a light switch at the top of the stairs and the bottom of the stairs. If you do not have them, ask someone to add them for you. What else can I do to help prevent falls?  Wear shoes that:  Do not have high heels.  Have rubber bottoms.  Are comfortable and fit you well.  Are closed at the toe. Do not wear sandals.  If you  use a stepladder:  Make sure that it is fully opened. Do not climb a closed stepladder.  Make sure that both sides of the stepladder are locked into place.  Ask someone to hold it for you, if possible.  Clearly mark and make sure that you can see:  Any grab bars or handrails.  First and last steps.  Where the edge of each step is.  Use tools that help you move around (mobility aids) if they are needed. These include:  Canes.  Walkers.  Scooters.  Crutches.  Turn on the lights when you go into a dark area. Replace any light bulbs as soon as they burn out.  Set up your furniture so you have a clear path. Avoid moving your furniture around.  If any of your floors are uneven, fix them.  If there are any pets around you, be aware of where they are.  Review your medicines with your doctor. Some medicines can make you feel dizzy. This can increase your chance of falling. Ask your doctor what other things that you can do to help prevent falls. This information is not intended to replace advice given to you by your health care provider. Make sure you discuss any questions you have with your health care provider. Document Released: 01/17/2009 Document Revised: 08/29/2015 Document Reviewed: 04/27/2014 Elsevier Interactive Patient Education  2017 [employer].

## 2020-08-06 ENCOUNTER — Telehealth: Payer: Self-pay | Admitting: Pharmacist

## 2020-08-06 DIAGNOSIS — I10 Essential (primary) hypertension: Secondary | ICD-10-CM

## 2020-08-06 DIAGNOSIS — E785 Hyperlipidemia, unspecified: Secondary | ICD-10-CM

## 2020-08-06 MED ORDER — NEXIUM 40 MG PO CPDR: DELAYED_RELEASE_CAPSULE | ORAL | 1 refills | Status: DC

## 2020-08-06 MED ORDER — METOPROLOL SUCCINATE ER 50 MG PO TB24: 50.0000 mg | ORAL_TABLET | Freq: Every day | ORAL | 1 refills | Status: DC

## 2020-08-06 MED ORDER — LOVASTATIN 40 MG PO TABS: ORAL_TABLET | ORAL | 1 refills | Status: DC

## 2020-08-06 MED ORDER — GLIPIZIDE 5 MG PO TABS: 5.0000 mg | ORAL_TABLET | Freq: Two times a day (BID) | ORAL | 1 refills | Status: DC

## 2020-08-06 NOTE — Telephone Encounter (Signed)
Perfect. Thank you!

## 2020-08-06 NOTE — Telephone Encounter (Signed)
Medications sent to the pharmacy.

## 2020-08-06 NOTE — Chronic Care Management (AMB) (Signed)
Patient notified that it may take 2-3 weeks for her to receive the medication from patient assistance. [facility] to fill 30 day supply in easy open vial. [facility] notified.  Lizbeth Bark Clinical Pharmacist Assistant [phone]

## 2020-08-06 NOTE — Chronic Care Management (AMB) (Signed)
    Chronic Care Management Pharmacy Assistant   Name: Anna Juarez  MRN: [id number] DOB: 01-24-1951   Reason for Encounter: Patient Assistance Program     Medications: Outpatient Encounter Medications as of 08/06/2020  Medication Sig  . acetaminophen (TYLENOL) 500 MG tablet Take 500 mg by mouth as needed.  . benazepril (LOTENSIN) 40 MG tablet TAKE 1 TABLET BY MOUTH EVERY DAY  . dapagliflozin propanediol (FARXIGA) 10 MG TABS tablet Take 1 tablet (10 mg total) by mouth daily before breakfast.  . glipiZIDE (GLUCOTROL) 5 MG tablet Take 1 tablet (5 mg total) by mouth 2 (two) times daily before a meal.  . glucose blood (ONETOUCH ULTRA) test strip 1 each by Other route as needed for other. Use as instructed  . lovastatin (MEVACOR) 40 MG tablet TAKE 1 TABLET BY MOUTH EVERYDAY AT BEDTIME  . metoprolol succinate (TOPROL-XL) 50 MG 24 hr tablet TAKE 1 TABLET (50 MG TOTAL) BY MOUTH DAILY. TAKE WITH OR IMMEDIATELY FOLLOWING A MEAL.  Marland Kitchen NEXIUM 40 MG capsule TAKE 1 CAPSULE BY MOUTH EVERY DAY  . pregabalin (LYRICA) 150 MG capsule Take 1 capsule (150 mg total) by mouth 3 (three) times daily.  . traMADol (ULTRAM) 50 MG tablet Take 1 tablet (50 mg total) by mouth every 8 (eight) hours as needed.   No facility-administered encounter medications on file as of 08/06/2020.    Called [facility] for update on patients assistance application for Farxiga. Representative informed me that they did not receive the patients insurance information. I provided information to representative. She informed me it will take 48-72 hours for approval.

## 2020-08-06 NOTE — Telephone Encounter (Signed)
[facility] is requesting refills for metoprolol, esomeprazole, lovastatin and glipizide. Patient requests 90 day supply if possible. Thank You!

## 2020-08-12 ENCOUNTER — Ambulatory Visit: Payer: Self-pay | Admitting: *Deleted

## 2020-08-12 ENCOUNTER — Telehealth: Payer: Self-pay | Admitting: Pharmacist

## 2020-08-12 DIAGNOSIS — U071 COVID-19: Secondary | ICD-10-CM

## 2020-08-12 NOTE — Telephone Encounter (Signed)
Attempted to contact patient and was unable to left a message voicemail wasn't set up.

## 2020-08-12 NOTE — Addendum Note (Signed)
Addended by: [name] on: 08/12/2020 01:30 PM   Modules accepted: Orders

## 2020-08-12 NOTE — Telephone Encounter (Signed)
Please let patient know that I placed a referral for the COVID infusion clinic.  This will help [place] decide which antiviral medication is best for her.

## 2020-08-12 NOTE — Progress Notes (Signed)
    Chronic Care Management Pharmacy Assistant   Name: Anna Juarez  MRN: [id number] DOB: Nov 07, 1950  Reason for Encounter: [facility] [place] Patient Assistance   Medications: Outpatient Encounter Medications as of 08/12/2020  Medication Sig  . acetaminophen (TYLENOL) 500 MG tablet Take 500 mg by mouth as needed.  . benazepril (LOTENSIN) 40 MG tablet TAKE 1 TABLET BY MOUTH EVERY DAY  . dapagliflozin propanediol (FARXIGA) 10 MG TABS tablet Take 1 tablet (10 mg total) by mouth daily before breakfast.  . glipiZIDE (GLUCOTROL) 5 MG tablet Take 1 tablet (5 mg total) by mouth 2 (two) times daily before a meal.  . glucose blood (ONETOUCH ULTRA) test strip 1 each by Other route as needed for other. Use as instructed  . lovastatin (MEVACOR) 40 MG tablet TAKE 1 TABLET BY MOUTH EVERYDAY AT BEDTIME  . metoprolol succinate (TOPROL-XL) 50 MG 24 hr tablet Take 1 tablet (50 mg total) by mouth daily. Take with or immediately following a meal.  . NEXIUM 40 MG capsule TAKE 1 CAPSULE BY MOUTH EVERY DAY  . pregabalin (LYRICA) 150 MG capsule Take 1 capsule (150 mg total) by mouth 3 (three) times daily.  . traMADol (ULTRAM) 50 MG tablet Take 1 tablet (50 mg total) by mouth every 8 (eight) hours as needed.   No facility-administered encounter medications on file as of 08/12/2020.   Spoke with Ms. Flanagin and she informed me that she was approved for [name] through patient assistance and will receive her first delivery in the next 7-10 days. She understands that [employer] should be contacted for any refills or questions.   Wilford Sports CPA,CMA

## 2020-08-12 NOTE — Telephone Encounter (Signed)
C/o covid positive with symptoms of headache, scratchy throat since yesterday 08/11/20. Patient tested with at home covid test and was positive today 08/12/20. Denies fever, difficulty breathing, chest pain. O2 sat at 97% RA at this time. Dry cough , Not bad at this time. Poor appetite noted. Reviewed home isolation precautions with patient. Patient reports her husband is also positive for covid and was taken to hospital Saturday 08/10/20. Patient reports significant health hx and is requesting if she should be given any medication so covid symptoms do not get worse. Patient's husband was given Rx for Molnupiravir 200 mg cap. But had to go to hospital due to worsening symptoms. Patient would like to know if that medication can help her since husband is being treated in hospital. Patient concerned due to CKD and diabetes. Please advise regarding medication or [facility] infusion clinic. Contacted [facility] in office to notify PCP. Care advise given. Patient verbalized understanding of care advise and to call back or go to ED if symptoms worsen.   Reason for Disposition . [1] HIGH RISK for severe COVID complications (e.g., weak immune system, age > 81 years, obesity with BMI > 25, pregnant, chronic lung disease or other chronic medical condition) AND [2] COVID symptoms (e.g., cough, fever)  (Exceptions: Already seen by PCP and no new or worsening symptoms.)  Answer Assessment - Initial Assessment Questions 1. COVID-19 DIAGNOSIS: "Who made your COVID-19 diagnosis?" "Was it confirmed by a positive lab test or self-test?" If not diagnosed by a doctor (or NP/PA), ask "Are there lots of cases (community spread) where you live?" Note: See public health department website, if unsure.     At home covid test positive today  2. COVID-19 EXPOSURE: "Was there any known exposure to COVID before the symptoms began?" CDC Definition of close contact: within 6 feet (2 meters) for a total of 15 minutes or more over a 24-hour period.      Husband  positive for covid and now in hospital since Saturday 08/10/20.  3. ONSET: "When did the COVID-19 symptoms start?"      Yesterday 08/11/20 4. WORST SYMPTOM: "What is your worst symptom?" (e.g., cough, fever, shortness of breath, muscle aches)     Headache, scratchy sore throat 5. COUGH: "Do you have a cough?" If Yes, ask: "How bad is the cough?"       Dry cough , not bad 6. FEVER: "Do you have a fever?" If Yes, ask: "What is your temperature, how was it measured, and when did it start?"     no 7. RESPIRATORY STATUS: "Describe your breathing?" (e.g., shortness of breath, wheezing, unable to speak)      Some shortness of breath leaning back  8. BETTER-SAME-WORSE: "Are you getting better, staying the same or getting worse compared to yesterday?"  If getting worse, ask, "In what way?"     Worse  9. HIGH RISK DISEASE: "Do you have any chronic medical problems?" (e.g., asthma, heart or lung disease, weak immune system, obesity, etc.)     Stage 3 kidney failure, diabetes, obesity 10. VACCINE: "Have you had the COVID-19 vaccine?" If Yes, ask: "Which one, how many shots, when did you get it?"       Moderna vaccine x 2 11. BOOSTER: "Have you received your COVID-19 booster?" If Yes, ask: "Which one and when did you get it?"       Moderna x1  12. PREGNANCY: "Is there any chance you are pregnant?" "When was your last menstrual period?"  na 13. OTHER SYMPTOMS: "Do you have any other symptoms?"  (e.g., chills, fatigue, headache, loss of smell or taste, muscle pain, sore throat)       Headache sore throat, cough  14. O2 SATURATION MONITOR:  "Do you use an oxygen saturation monitor (pulse oximeter) at home?" If Yes, ask "What is your reading (oxygen level) today?" "What is your usual oxygen saturation reading?" (e.g., 95%)       97% RA  Protocols used: CORONAVIRUS (COVID-19) DIAGNOSED OR SUSPECTED-A-AH

## 2020-08-13 ENCOUNTER — Telehealth: Payer: Self-pay

## 2020-08-13 NOTE — Telephone Encounter (Signed)
Patient notified

## 2020-08-13 NOTE — Telephone Encounter (Signed)
Called to discuss with patient about COVID-19 symptoms and the use of one of the available treatments for those with mild to moderate Covid symptoms and at a high risk of hospitalization.  Pt appears to qualify for outpatient treatment due to co-morbid conditions and/or a member of an at-risk group in accordance with the FDA Emergency Use Authorization.    Symptom onset: Headache,sore throat 08/12/20 Vaccinated: Yes Booster? Yes Immunocompromised?No Qualifiers: DM,CKD NIH Criteria: Tier 1  Pt. Would like to think about treatment.   Anna Juarez

## 2020-08-14 ENCOUNTER — Ambulatory Visit: Payer: Medicare PPO | Admitting: Nurse Practitioner

## 2020-09-03 ENCOUNTER — Other Ambulatory Visit: Payer: Self-pay | Admitting: Nurse Practitioner

## 2020-09-03 DIAGNOSIS — M545 Low back pain, unspecified: Secondary | ICD-10-CM

## 2020-09-03 DIAGNOSIS — G8929 Other chronic pain: Secondary | ICD-10-CM

## 2020-09-03 NOTE — Telephone Encounter (Signed)
Requested medication (s) are due for refill today:no  Requested medication (s) are on the active medication list: yes   Last refill:  09/03/2020  Future visit scheduled: no  Notes to clinic:  this refill cannot be delegated    Requested Prescriptions  Pending Prescriptions Disp Refills   traMADol (ULTRAM) 50 MG tablet [Pharmacy Med Name: tramadol 50 mg tablet] 30 tablet 1    Sig: TAKE ONE TABLET BY MOUTH EVERY 8 HOURS AS NEEDED      Not Delegated - Analgesics:  Opioid Agonists Failed - 09/03/2020  1:20 PM      Failed - This refill cannot be delegated      Failed - Urine Drug Screen completed in last 360 days      Passed - Valid encounter within last 6 months    Recent Outpatient Visits           3 months ago Annual physical exam   [facility] [name], NP   5 months ago Pain with urination   [facility] Myles Gip, DO   9 months ago Hypertension associated with diabetes [facility])   [facility] Volney American, [place]   1 year ago Hypertension associated with diabetes [facility])   [facility], [place]   1 year ago Hypertension associated with diabetes [facility])   [facility]ney [facility], [place]       Future Appointments             In 11 months [facility]

## 2020-09-04 NOTE — Telephone Encounter (Signed)
Pt has apt on apt on 09/25/2020

## 2020-09-04 NOTE — Telephone Encounter (Signed)
Patient should have a refill at the pharmacy.  Can we please call to verify?

## 2020-09-04 NOTE — Telephone Encounter (Signed)
Medication filled yesterday

## 2020-09-19 ENCOUNTER — Telehealth: Payer: Self-pay | Admitting: Pharmacist

## 2020-09-19 NOTE — Chronic Care Management (AMB) (Signed)
    Chronic Care Management Pharmacy Assistant   Name: Anna Juarez  MRN: [id number] DOB: 04-18-1950   Reason for Encounter: Disease State General Adherence   Recent office visits:  None noted  Recent consult visits:  None noted  Hospital visits:  None in previous 6 months  Medications: Outpatient Encounter Medications as of 09/19/2020  Medication Sig   acetaminophen (TYLENOL) 500 MG tablet Take 500 mg by mouth as needed.   benazepril (LOTENSIN) 40 MG tablet TAKE 1 TABLET BY MOUTH EVERY DAY   dapagliflozin propanediol (FARXIGA) 10 MG TABS tablet Take 1 tablet (10 mg total) by mouth daily before breakfast.   glipiZIDE (GLUCOTROL) 5 MG tablet Take 1 tablet (5 mg total) by mouth 2 (two) times daily before a meal.   glucose blood (ONETOUCH ULTRA) test strip 1 each by Other route as needed for other. Use as instructed   lovastatin (MEVACOR) 40 MG tablet TAKE 1 TABLET BY MOUTH EVERYDAY AT BEDTIME   metoprolol succinate (TOPROL-XL) 50 MG 24 hr tablet Take 1 tablet (50 mg total) by mouth daily. Take with or immediately following a meal.   NEXIUM 40 MG capsule TAKE 1 CAPSULE BY MOUTH EVERY DAY   pregabalin (LYRICA) 150 MG capsule Take 1 capsule (150 mg total) by mouth 3 (three) times daily.   traMADol (ULTRAM) 50 MG tablet Take 1 tablet (50 mg total) by mouth every 8 (eight) hours as needed.   No facility-administered encounter medications on file as of 09/19/2020.    Have you had any problems recently with your health? Patient states she has no problems with her health.  Have you had any problems with your pharmacy? Patient states she has no problems with her pharmacy.  What issues or side effects are you having with your medications? Patient states she has no issues or side effects with her medications.  What would you like me to pass along to Anna Juarez, CPP for them to help you with?  Patient states there is nothing at this time.  What can we do to take care of you  better? Patient states there is nothing at this time.   Star Rating Drugs: Lovastatin 40 mg Last filled:12/21/19 90 DS Glipizide 5 mg Last filled:02/12/20 90 DS Farxiga 10 mg Last filled:None noted Benazepril 40 mg Last filled:02/25/20 90 DS   Anna Juarez, [place]

## 2020-09-25 ENCOUNTER — Encounter: Payer: Self-pay | Admitting: Nurse Practitioner

## 2020-09-25 ENCOUNTER — Other Ambulatory Visit: Payer: Self-pay

## 2020-09-25 ENCOUNTER — Ambulatory Visit: Payer: Medicare PPO | Admitting: Nurse Practitioner

## 2020-09-25 VITALS — BP 133/65 | HR 61 | Temp 98.5°F | Ht 62.09 in | Wt 308.2 lb

## 2020-09-25 DIAGNOSIS — E1169 Type 2 diabetes mellitus with other specified complication: Secondary | ICD-10-CM | POA: Diagnosis not present

## 2020-09-25 DIAGNOSIS — E1142 Type 2 diabetes mellitus with diabetic polyneuropathy: Secondary | ICD-10-CM | POA: Diagnosis not present

## 2020-09-25 DIAGNOSIS — E1159 Type 2 diabetes mellitus with other circulatory complications: Secondary | ICD-10-CM

## 2020-09-25 DIAGNOSIS — E785 Hyperlipidemia, unspecified: Secondary | ICD-10-CM

## 2020-09-25 DIAGNOSIS — M545 Low back pain, unspecified: Secondary | ICD-10-CM

## 2020-09-25 DIAGNOSIS — I152 Hypertension secondary to endocrine disorders: Secondary | ICD-10-CM

## 2020-09-25 DIAGNOSIS — G8929 Other chronic pain: Secondary | ICD-10-CM

## 2020-09-25 DIAGNOSIS — N1832 Chronic kidney disease, stage 3b: Secondary | ICD-10-CM | POA: Diagnosis not present

## 2020-09-25 MED ORDER — TRAMADOL HCL 50 MG PO TABS: 50.0000 mg | ORAL_TABLET | Freq: Three times a day (TID) | ORAL | 1 refills | Status: DC | PRN

## 2020-09-25 NOTE — Assessment & Plan Note (Signed)
BPs stable and under good control, continue present medications and home monitoring. Labs ordered today. Follow up in 6 months. Call sooner if concerns arise.

## 2020-09-25 NOTE — Assessment & Plan Note (Signed)
Stable and well controlled on lyrica, prn tramadol. Continue cautious use of that, precautions reviewed. PMP checked today.  Patient has been taking Tramadol more due to her husband being ill and needing to move him frequently.  Refill sent today.

## 2020-09-25 NOTE — Assessment & Plan Note (Signed)
Recheck labs, stay well hydrated.  Follow up in 6 months. Call sooner if concerns arise.

## 2020-09-25 NOTE — Assessment & Plan Note (Signed)
Recheck lipids, adjust as needed. Return in 6 months.  Call sooner if concerns arise.

## 2020-09-25 NOTE — Progress Notes (Signed)
BP 133/65   Pulse 61   Temp 98.5 F (36.9 C)   Ht 5' 2.09" (1.577 m)   Wt (!) 308 lb 4 oz (139.8 kg)   SpO2 97%   BMI 56.22 kg/m    Subjective:    Patient ID: Anna Juarez, female    DOB: Jan 19, 1951, 70 y.o.   MRN: [id number]  HPI: Anna Juarez is a 70 y.o. female  Chief Complaint  Patient presents with   Diabetes   Hyperlipidemia   Hypertension   HYPERTENSION / [place] Satisfied with current treatment? yes Duration of hypertension: years BP monitoring frequency: not checking BP range:  BP medication side effects: no Past BP meds:  metoprolol and benazepril Duration of hyperlipidemia: years Cholesterol medication side effects: no Cholesterol supplements: none Past cholesterol medications: lovastatin (mevacor) Medication compliance: excellent compliance Aspirin: no Recent stressors: no Recurrent headaches: no Visual changes: no Palpitations: no Dyspnea: no Chest pain: no Lower extremity edema: no Dizzy/lightheaded: no  DIABETES Hypoglycemic episodes:no Polydipsia/polyuria: no Visual disturbance: no Chest pain: no Paresthesias: no Glucose Monitoring: no  Accucheck frequency: Not Checking  Fasting glucose:  Post prandial:  Evening:  Before meals: Taking Insulin?: no  Long acting insulin:  Short acting insulin: Blood Pressure Monitoring: not checking Retinal Examination: Up to Date Foot Exam: Up to Date Diabetic Education: Completed Pneumovax: Up to Date Influenza: Up to Date Aspirin: no  Relevant past medical, surgical, family and social history reviewed and updated as indicated. Interim medical history since our last visit reviewed. Allergies and medications reviewed and updated.  Review of Systems  Eyes:  Negative for visual disturbance.  Respiratory:  Negative for cough, chest tightness and shortness of breath.   Cardiovascular:  Negative for chest pain, palpitations and leg swelling.  Endocrine: Negative for polydipsia and  polyuria.  Neurological:  Negative for dizziness, numbness and headaches.   Per HPI unless specifically indicated above     Objective:    BP 133/65   Pulse 61   Temp 98.5 F (36.9 C)   Ht 5' 2.09" (1.577 m)   Wt (!) 308 lb 4 oz (139.8 kg)   SpO2 97%   BMI 56.22 kg/m   Wt Readings from Last 3 Encounters:  09/25/20 (!) 308 lb 4 oz (139.8 kg)  08/05/20 (!) 320 lb (145.2 kg)  05/17/20 (!) 320 lb (145.2 kg)    Physical Exam Vitals and nursing note reviewed.  Constitutional:      General: She is not in acute distress.    Appearance: Normal appearance. She is obese. She is not ill-appearing, toxic-appearing or diaphoretic.  HENT:     Head: Normocephalic.     Right Ear: External ear normal.     Left Ear: External ear normal.     Nose: Nose normal.     Mouth/Throat:     Mouth: Mucous membranes are moist.     Pharynx: Oropharynx is clear.  Eyes:     General:        Right eye: No discharge.        Left eye: No discharge.     Extraocular Movements: Extraocular movements intact.     Conjunctiva/sclera: Conjunctivae normal.     Pupils: Pupils are equal, round, and reactive to light.  Cardiovascular:     Rate and Rhythm: Normal rate and regular rhythm.     Heart sounds: No murmur heard. Pulmonary:     Effort: Pulmonary effort is normal. No respiratory distress.  Breath sounds: Normal breath sounds. No wheezing or rales.  Musculoskeletal:     Cervical back: Normal range of motion and neck supple.  Skin:    General: Skin is warm and dry.     Capillary Refill: Capillary refill takes less than 2 seconds.  Neurological:     General: No focal deficit present.     Mental Status: She is alert and oriented to person, place, and time. Mental status is at baseline.  Psychiatric:        Mood and Affect: Mood normal.        Behavior: Behavior normal.        Thought Content: Thought content normal.        Judgment: Judgment normal.    Results for orders placed or performed in  visit on 05/17/20  Microscopic Examination   BLD  Result Value Ref Range   WBC, UA 0-5 0 - 5 /hpf   RBC 0-2 0 - 2 /hpf   Epithelial Cells (non renal) 0-10 0 - 10 /hpf   Bacteria, UA None seen None seen/Few  Comp Met (CMET)  Result Value Ref Range   Glucose 122 (H) 65 - 99 mg/dL   BUN 17 8 - 27 mg/dL   Creatinine, Ser 1.15 (H) 0.57 - 1.00 mg/dL   GFR calc non Af Amer 48 (L) >59 mL/min/1.73   GFR calc Af Amer 55 (L) >59 mL/min/1.73   BUN/Creatinine Ratio 15 12 - 28   Sodium 142 134 - 144 mmol/L   Potassium 4.4 3.5 - 5.2 mmol/L   Chloride 99 96 - 106 mmol/L   CO2 23 20 - 29 mmol/L   Calcium 9.4 8.7 - 10.3 mg/dL   Total Protein 7.5 6.0 - 8.5 g/dL   Albumin 4.5 3.7 - 4.7 g/dL   Globulin, Total 3.0 1.5 - 4.5 g/dL   Albumin/Globulin Ratio 1.5 1.2 - 2.2   Bilirubin Total 0.6 0.0 - 1.2 mg/dL   Alkaline Phosphatase 64 44 - 121 IU/L   AST 94 (H) 0 - 40 IU/L   ALT 49 (H) 0 - 32 IU/L  Bayer DCA Hb A1c Waived  Result Value Ref Range   HB A1C (BAYER DCA - WAIVED) 7.8 (H) <7.0 %  Lipid Profile  Result Value Ref Range   Cholesterol, Total 162 100 - 199 mg/dL   Triglycerides 180 (H) 0 - 149 mg/dL   HDL 48 >39 mg/dL   VLDL Cholesterol Cal 31 5 - 40 mg/dL   LDL Chol Calc (NIH) 83 0 - 99 mg/dL   Chol/HDL Ratio 3.4 0.0 - 4.4 ratio  TSH  Result Value Ref Range   TSH 2.940 0.450 - 4.500 uIU/mL  Urinalysis, Routine w reflex microscopic  Result Value Ref Range   Specific Gravity, UA 1.015 1.005 - 1.030   pH, UA 5.5 5.0 - 7.5   Color, UA Yellow Yellow   Appearance Ur Clear Clear   Leukocytes,UA Trace (A) Negative   Protein,UA Negative Negative/Trace   Glucose, UA Negative Negative   Ketones, UA Negative Negative   RBC, UA Trace (A) Negative   Bilirubin, UA Negative Negative   Urobilinogen, Ur 0.2 0.2 - 1.0 mg/dL   Nitrite, UA Negative Negative   Microscopic Examination See below:   CBC w/Diff  Result Value Ref Range   WBC 5.8 3.4 - 10.8 x10E3/uL   RBC 4.63 3.77 - 5.28 x10E6/uL    Hemoglobin 13.6 11.1 - 15.9 g/dL   Hematocrit 42.0 34.0 - 46.6 %  MCV 91 79 - 97 fL   MCH 29.4 26.6 - 33.0 pg   MCHC 32.4 31.5 - 35.7 g/dL   RDW 12.4 11.7 - 15.4 %   Platelets 179 150 - 450 x10E3/uL   Neutrophils 62 Not Estab. %   Lymphs 25 Not Estab. %   Monocytes 9 Not Estab. %   Eos 2 Not Estab. %   Basos 1 Not Estab. %   Neutrophils Absolute 3.7 1.4 - 7.0 x10E3/uL   Lymphocytes Absolute 1.4 0.7 - 3.1 x10E3/uL   Monocytes Absolute 0.5 0.1 - 0.9 x10E3/uL   EOS (ABSOLUTE) 0.1 0.0 - 0.4 x10E3/uL   Basophils Absolute 0.0 0.0 - 0.2 x10E3/uL   Immature Granulocytes 1 Not Estab. %   Immature Grans (Abs) 0.0 0.0 - 0.1 x10E3/uL      Assessment & Plan:   Problem List Items Addressed This Visit       Cardiovascular and Mediastinum   Hypertension associated with diabetes ([place]) - Primary    BPs stable and under good control, continue present medications and home monitoring. Labs ordered today. Follow up in 6 months. Call sooner if concerns arise.        Relevant Orders   Comp Met (CMET)   HgB A1c   Lipid Profile     Endocrine   DM type 2 with diabetic peripheral neuropathy (HCC)    Stable and well controlled on lyrica, prn tramadol. Continue cautious use of that, precautions reviewed. PMP checked today.  Patient has been taking Tramadol more due to her husband being ill and needing to move him frequently.  Refill sent today.        Relevant Orders   Comp Met (CMET)   HgB A1c   Lipid Profile   Hyperlipidemia associated with type 2 diabetes mellitus (HCC)    Recheck lipids, adjust as needed. Return in 6 months.  Call sooner if concerns arise.        Relevant Orders   Comp Met (CMET)   HgB A1c   Lipid Profile     Genitourinary   CKD (chronic kidney disease) stage 3, GFR 30-59 ml/min (HCC)    Recheck labs, stay well hydrated.  Follow up in 6 months. Call sooner if concerns arise.        Relevant Orders   Comp Met (CMET)   HgB A1c   Lipid Profile     Other    Back pain    Stable and well controlled on lyrica, prn tramadol. Using Tramadol more due to husbands needs.        Relevant Medications   traMADol (ULTRAM) 50 MG tablet     Follow up plan: Return in about 6 months (around 03/27/2021) for HTN, HLD, DM2 FU.

## 2020-09-25 NOTE — Assessment & Plan Note (Signed)
Stable and well controlled on lyrica, prn tramadol. Using Tramadol more due to husbands needs.

## 2020-09-26 LAB — COMPREHENSIVE METABOLIC PANEL
ALT: 34 IU/L — ABNORMAL HIGH (ref 0–32)
AST: 49 IU/L — ABNORMAL HIGH (ref 0–40)
Albumin/Globulin Ratio: 1.4 (ref 1.2–2.2)
Albumin: 4.4 g/dL (ref 3.7–4.7)
Alkaline Phosphatase: 66 IU/L (ref 44–121)
BUN/Creatinine Ratio: 12 (ref 12–28)
BUN: 19 mg/dL (ref 8–27)
Bilirubin Total: 0.6 mg/dL (ref 0.0–1.2)
CO2: 24 mmol/L (ref 20–29)
Calcium: 9.3 mg/dL (ref 8.7–10.3)
Chloride: 100 mmol/L (ref 96–106)
Creatinine, Ser: 1.55 mg/dL — ABNORMAL HIGH (ref 0.57–1.00)
Globulin, Total: 3.1 g/dL (ref 1.5–4.5)
Glucose: 214 mg/dL — ABNORMAL HIGH (ref 65–99)
Potassium: 4.8 mmol/L (ref 3.5–5.2)
Sodium: 141 mmol/L (ref 134–144)
Total Protein: 7.5 g/dL (ref 6.0–8.5)
eGFR: 35 mL/min/{1.73_m2} — ABNORMAL LOW (ref >59)

## 2020-09-26 LAB — HEMOGLOBIN A1C
Est. average glucose Bld gHb Est-mCnc: 180 mg/dL
Hgb A1c MFr Bld: 7.9 % — ABNORMAL HIGH (ref 4.8–5.6)

## 2020-09-26 LAB — LIPID PANEL
Chol/HDL Ratio: 3 ratio (ref 0.0–4.4)
Cholesterol, Total: 153 mg/dL (ref 100–199)
HDL: 51 mg/dL (ref >39)
LDL Chol Calc (NIH): 71 mg/dL (ref 0–99)
Triglycerides: 185 mg/dL — ABNORMAL HIGH (ref 0–149)
VLDL Cholesterol Cal: 31 mg/dL (ref 5–40)

## 2020-09-26 NOTE — Progress Notes (Signed)
Please let patient know that her lab work shows that her kidney function has declined slightly.  Liver function has improved. A1c increased from 7.4 to 7.8. A1c could be elevated due to the stress she is experiencing. No medication changes at this time.  We will see each other in 3 months and see how she is doing.  Can we please move her appointment up to 3 months?

## 2020-10-02 ENCOUNTER — Telehealth: Payer: Self-pay | Admitting: Nurse Practitioner

## 2020-10-02 MED ORDER — FLUCONAZOLE 150 MG PO TABS: 150.0000 mg | ORAL_TABLET | Freq: Once | ORAL | 0 refills | Status: AC

## 2020-10-02 NOTE — Telephone Encounter (Signed)
I called and spoke with patient's sister to send my condolences to Ms. Flamenco. I have also called in a prescription for Diflucan to treat her yeast infection.

## 2020-10-02 NOTE — Telephone Encounter (Signed)
Pt declined apt as she states she is burring her  husband this week and has a lot going on is asking if medication can be sent and stated she is more than willing to come in next week for the apt but that it is impossible to come this week as she is very busy with funeral and family.

## 2020-10-02 NOTE — Telephone Encounter (Signed)
Pt is calling to let karen know  Jeneen Rinks her husband passed away and they will be going to funeral home today. Pt would like to know if karen would prescribe her diflucan. Pt dentist gave her amoxicillin on Monday and she has an appt with dentist next work to get tooth repaired .She now has vaginal burning and itching and would like diflucan [facility] into [facility] [address] phone number [phone]

## 2020-10-04 ENCOUNTER — Other Ambulatory Visit: Payer: Self-pay

## 2020-10-04 ENCOUNTER — Ambulatory Visit: Payer: Medicare PPO | Admitting: Nurse Practitioner

## 2020-10-04 ENCOUNTER — Encounter: Payer: Self-pay | Admitting: Nurse Practitioner

## 2020-10-04 VITALS — BP 117/69 | HR 59 | Temp 98.6°F | Wt 309.4 lb

## 2020-10-04 DIAGNOSIS — R309 Painful micturition, unspecified: Secondary | ICD-10-CM | POA: Diagnosis not present

## 2020-10-04 DIAGNOSIS — N898 Other specified noninflammatory disorders of vagina: Secondary | ICD-10-CM | POA: Diagnosis not present

## 2020-10-04 DIAGNOSIS — B3731 Acute candidiasis of vulva and vagina: Secondary | ICD-10-CM

## 2020-10-04 DIAGNOSIS — B373 Candidiasis of vulva and vagina: Secondary | ICD-10-CM

## 2020-10-04 LAB — URINALYSIS, ROUTINE W REFLEX MICROSCOPIC
Bilirubin, UA: NEGATIVE
Ketones, UA: NEGATIVE
Leukocytes,UA: NEGATIVE
Nitrite, UA: NEGATIVE
Protein,UA: NEGATIVE
RBC, UA: NEGATIVE
Specific Gravity, UA: 1.01 (ref 1.005–1.030)
Urobilinogen, Ur: 0.2 mg/dL (ref 0.2–1.0)
pH, UA: 5 (ref 5.0–7.5)

## 2020-10-04 LAB — WET PREP FOR TRICH, YEAST, CLUE
Clue Cell Exam: NEGATIVE
Trichomonas Exam: NEGATIVE
Yeast Exam: POSITIVE — AB

## 2020-10-04 MED ORDER — FLUCONAZOLE 150 MG PO TABS: ORAL_TABLET | ORAL | 0 refills | Status: DC

## 2020-10-04 NOTE — Progress Notes (Signed)
Acute Office Visit  Subjective:    Patient ID: Anna Juarez, female    DOB: 1950/05/25, 70 y.o.   MRN: [id number]  Chief Complaint  Patient presents with   Vaginitis    Was given Amoxicillin on 6/27 by her dentist, thinks she has a yeast infection. Was also send in [place] on 6/30, patient states that this did not work.    HPI Patient is in today for vaginal itching and discomfort. She was started on antibiotic for tooth infection. She started having vaginal itching and discomfort on 10/02/20 and her dentist called in [place]. Symptoms have not improved and seem to be worsening.   VAGINAL DISCOMFORT  Duration: days Discharge description:  none   Pruritus: yes Dysuria: yes Malodorous: no Urinary frequency: no Fevers: no Abdominal pain: no  Sexual activity: not sexually active History of sexually transmitted diseases: no Recent antibiotic use: yes Context: worse  Treatments attempted: antifungal   Past Medical History:  Diagnosis Date   Chronic kidney disease    Diabetes mellitus without complication ([place])    Extreme obesity    Fatty liver    Hyperlipidemia    Hypertension    Radiculopathy, cervical    Renal insufficiency    pt states she has chronic kidney disease.     Past Surgical History:  Procedure Laterality Date   ABDOMINAL HYSTERECTOMY     JOINT REPLACEMENT     SPINE SURGERY      Family History  Problem Relation Age of Onset   Diabetes Mother    Heart disease Mother    Hypertension Mother    Heart disease Father    Cancer Brother    Hypertension Brother    Breast cancer Maternal Aunt    Breast cancer Paternal Aunt     Social History   Socioeconomic History   Marital status: Married    Spouse name: Not on file   Number of children: Not on file   Years of education: Not on file   Highest education level: 12th grade  Occupational History   Occupation: retired   Tobacco Use   Smoking status: Never   Smokeless tobacco: Never   Vaping Use   Vaping Use: Never used  Substance and Sexual Activity   Alcohol use: No   Drug use: No   Sexual activity: Not on file  Other Topics Concern   Not on file  Social History Narrative   [place]; lives with husband; never smoker; no alcohol; retd. Manufacturing.    Social Determinants of Health   Financial Resource Strain: Low Risk    Difficulty of Paying Living Expenses: Not hard at all  Food Insecurity: No Food Insecurity   Worried About Charity fundraiser in the Last Year: Never true   [place] in the Last Year: Never true  Transportation Needs: No Transportation Needs   Lack of Transportation (Medical): No   Lack of Transportation (Non-Medical): No  Physical Activity: Inactive   Days of Exercise per Week: 0 days   Minutes of Exercise per Session: 0 min  Stress: No Stress Concern Present   Feeling of Stress : Not at all  Social Connections: Not on file  Intimate Partner Violence: Not on file    Outpatient Medications Prior to Visit  Medication Sig Dispense Refill   acetaminophen (TYLENOL) 500 MG tablet Take 500 mg by mouth as needed.     benazepril (LOTENSIN) 40 MG tablet TAKE 1 TABLET BY  MOUTH EVERY DAY 90 tablet 1   dapagliflozin propanediol (FARXIGA) 10 MG TABS tablet Take 1 tablet (10 mg total) by mouth daily before breakfast. 90 tablet 1   glipiZIDE (GLUCOTROL) 5 MG tablet Take 1 tablet (5 mg total) by mouth 2 (two) times daily before a meal. 180 tablet 1   glucose blood (ONETOUCH ULTRA) test strip 1 each by Other route as needed for other. Use as instructed 100 strip 11   lovastatin (MEVACOR) 40 MG tablet TAKE 1 TABLET BY MOUTH EVERYDAY AT BEDTIME 90 tablet 1   metoprolol succinate (TOPROL-XL) 50 MG 24 hr tablet Take 1 tablet (50 mg total) by mouth daily. Take with or immediately following a meal. 90 tablet 1   NEXIUM 40 MG capsule TAKE 1 CAPSULE BY MOUTH EVERY DAY 90 capsule 1   pregabalin (LYRICA) 150 MG capsule Take 1 capsule (150  mg total) by mouth 3 (three) times daily. 270 capsule 1   traMADol (ULTRAM) 50 MG tablet Take 1 tablet (50 mg total) by mouth every 8 (eight) hours as needed. 30 tablet 1   No facility-administered medications prior to visit.    Allergies  Allergen Reactions   Actos [Pioglitazone]    Metformin And Related Diarrhea   Victoza [Liraglutide] Nausea And Vomiting    Review of Systems  Constitutional:  Positive for fatigue. Negative for fever.  Respiratory: Negative.    Cardiovascular: Negative.   Gastrointestinal: Negative.   Genitourinary:  Positive for dysuria and vaginal pain. Negative for frequency.  Skin: Negative.   Neurological: Negative.       Objective:    Physical Exam Vitals and nursing note reviewed.  Constitutional:      General: She is not in acute distress.    Appearance: Normal appearance.  HENT:     Head: Normocephalic.  Eyes:     Conjunctiva/sclera: Conjunctivae normal.  Abdominal:     Palpations: Abdomen is soft.     Tenderness: There is no abdominal tenderness.  Musculoskeletal:     Cervical back: Normal range of motion.  Skin:    General: Skin is warm.  Neurological:     General: No focal deficit present.     Mental Status: She is alert and oriented to person, place, and time.  Psychiatric:        Mood and Affect: Mood normal.        Behavior: Behavior normal.        Thought Content: Thought content normal.        Judgment: Judgment normal.    BP 117/69   Pulse (!) 59   Temp 98.6 F (37 C) (Oral)   Wt (!) 309 lb 6.4 oz (140.3 kg)   SpO2 96%   BMI 56.43 kg/m  Wt Readings from Last 3 Encounters:  10/04/20 (!) 309 lb 6.4 oz (140.3 kg)  09/25/20 (!) 308 lb 4 oz (139.8 kg)  08/05/20 (!) 320 lb (145.2 kg)    Health Maintenance Due  Topic Date Due   MAMMOGRAM  01/25/2020    There are no preventive care reminders to display for this patient.   Lab Results  Component Value Date   TSH 2.940 05/17/2020   Lab Results  Component Value  Date   WBC 5.8 05/17/2020   HGB 13.6 05/17/2020   HCT 42.0 05/17/2020   MCV 91 05/17/2020   PLT 179 05/17/2020   Lab Results  Component Value Date   NA 141 09/25/2020   K 4.8 09/25/2020  CO2 24 09/25/2020   GLUCOSE 214 (H) 09/25/2020   BUN 19 09/25/2020   CREATININE 1.55 (H) 09/25/2020   BILITOT 0.6 09/25/2020   ALKPHOS 66 09/25/2020   AST 49 (H) 09/25/2020   ALT 34 (H) 09/25/2020   PROT 7.5 09/25/2020   ALBUMIN 4.4 09/25/2020   CALCIUM 9.3 09/25/2020   ANIONGAP 12 02/20/2020   EGFR 35 (L) 09/25/2020   Lab Results  Component Value Date   CHOL 153 09/25/2020   Lab Results  Component Value Date   HDL 51 09/25/2020   Lab Results  Component Value Date   LDLCALC 71 09/25/2020   Lab Results  Component Value Date   TRIG 185 (H) 09/25/2020   Lab Results  Component Value Date   CHOLHDL 3.0 09/25/2020   Lab Results  Component Value Date   HGBA1C 7.9 (H) 09/25/2020       Assessment & Plan:   Problem List Items Addressed This Visit   None Visit Diagnoses     Painful urination    -  Primary   U/A negative for UTI   Relevant Orders   Urinalysis, Routine w reflex microscopic   WET PREP FOR TRICH, YEAST, CLUE   Vaginal itching       U/A negative for UTI, wet prep positive for yeast.    Relevant Orders   Urinalysis, Routine w reflex microscopic   WET PREP FOR TRICH, YEAST, CLUE   Vaginal yeast infection       Received one dose of dilfucan. Will send in 2 more doses that she can take 72 hours apart. F/U if symptoms don't improve.    Relevant Medications   fluconazole (DIFLUCAN) 150 MG tablet        Meds ordered this encounter  Medications   fluconazole (DIFLUCAN) 150 MG tablet    Sig: Take 1 tablet every 3 days until gone.    Dispense:  2 tablet    Refill:  0     Charyl Dancer, NP

## 2020-10-04 NOTE — Patient Instructions (Signed)
Monistat vaginal or cream or vaginal tablet

## 2020-10-09 ENCOUNTER — Ambulatory Visit: Payer: Medicare PPO | Admitting: Internal Medicine

## 2020-10-09 ENCOUNTER — Other Ambulatory Visit: Payer: Self-pay

## 2020-10-09 ENCOUNTER — Encounter: Payer: Self-pay | Admitting: Internal Medicine

## 2020-10-09 VITALS — BP 112/70 | HR 57 | Temp 99.0°F | Ht 62.09 in | Wt 309.0 lb

## 2020-10-09 DIAGNOSIS — B029 Zoster without complications: Secondary | ICD-10-CM

## 2020-10-09 MED ORDER — METHYLPREDNISOLONE 4 MG PO TBPK: ORAL_TABLET | ORAL | 0 refills | Status: DC

## 2020-10-09 MED ORDER — ACYCLOVIR 5 % EX OINT: 1.0000 "application " | TOPICAL_OINTMENT | CUTANEOUS | 0 refills | Status: DC

## 2020-10-09 NOTE — Progress Notes (Signed)
BP 112/70   Pulse (!) 57   Temp 99 F (37.2 C) (Oral)   Ht 5' 2.09" (1.577 m)   Wt (!) 309 lb (140.2 kg)   SpO2 98%   BMI 56.36 kg/m    Subjective:    Patient ID: Anna Juarez, female    DOB: 10-22-1950, 70 y.o.   MRN: [id number]  Chief Complaint  Patient presents with   Herpes Zoster    Went to UC on 7/4 was given Valacyclovir and clindamycin on her right neck and on her head    HPI: Anna Juarez is a 70 y.o. female  Pt is here for a follow up, was een in the [place] 4th of July , started off in the back of her neck says she has noticed increase in rash she was placed on valtrex 1 gm bid x 7 days  and clindamycin    Chief Complaint  Patient presents with   Herpes Zoster    Went to UC on 7/4 was given Valacyclovir and clindamycin on her right neck and on her head    Relevant past medical, surgical, family and social history reviewed and updated as indicated. Interim medical history since our last visit reviewed. Allergies and medications reviewed and updated.  Review of Systems  Per HPI unless specifically indicated above     Objective:    BP 112/70   Pulse (!) 57   Temp 99 F (37.2 C) (Oral)   Ht 5' 2.09" (1.577 m)   Wt (!) 309 lb (140.2 kg)   SpO2 98%   BMI 56.36 kg/m   Wt Readings from Last 3 Encounters:  10/09/20 (!) 309 lb (140.2 kg)  10/04/20 (!) 309 lb 6.4 oz (140.3 kg)  09/25/20 (!) 308 lb 4 oz (139.8 kg)    Physical Exam Constitutional:      Appearance: Normal appearance. She is not ill-appearing.  Skin:    Coloration: Skin is not pale.     Findings: Erythema and rash present.     Comments: Vesicles noticed on right side of the shoulder and neck with clustering  Neurological:     Mental Status: She is alert.    Results for orders placed or performed in visit on 10/04/20  WET PREP FOR [place], YEAST, CLUE   Specimen: Sterile Swab   Sterile Swab  Result Value Ref Range   Trichomonas Exam Negative Negative   Yeast Exam Positive  (A) Negative   Clue Cell Exam Negative Negative  Urinalysis, Routine w reflex microscopic  Result Value Ref Range   Specific Gravity, UA 1.010 1.005 - 1.030   pH, UA 5.0 5.0 - 7.5   Color, UA Yellow Yellow   Appearance Ur Hazy (A) Clear   Leukocytes,UA Negative Negative   Protein,UA Negative Negative/Trace   Glucose, UA 3+ (A) Negative   Ketones, UA Negative Negative   RBC, UA Negative Negative   Bilirubin, UA Negative Negative   Urobilinogen, Ur 0.2 0.2 - 1.0 mg/dL   Nitrite, UA Negative Negative        Current Outpatient Medications:    acetaminophen (TYLENOL) 500 MG tablet, Take 500 mg by mouth as needed., Disp: , Rfl:    acyclovir ointment (ZOVIRAX) 5 %, Apply 1 application topically every 3 (three) hours., Disp: 5 g, Rfl: 0   benazepril (LOTENSIN) 40 MG tablet, TAKE 1 TABLET BY MOUTH EVERY DAY, Disp: 90 tablet, Rfl: 1   dapagliflozin propanediol (FARXIGA) 10 MG TABS tablet, Take 1  tablet (10 mg total) by mouth daily before breakfast., Disp: 90 tablet, Rfl: 1   fluconazole (DIFLUCAN) 150 MG tablet, Take 1 tablet every 3 days until gone., Disp: 2 tablet, Rfl: 0   glipiZIDE (GLUCOTROL) 5 MG tablet, Take 1 tablet (5 mg total) by mouth 2 (two) times daily before a meal., Disp: 180 tablet, Rfl: 1   glucose blood (ONETOUCH ULTRA) test strip, 1 each by Other route as needed for other. Use as instructed, Disp: 100 strip, Rfl: 11   lovastatin (MEVACOR) 40 MG tablet, TAKE 1 TABLET BY MOUTH EVERYDAY AT BEDTIME, Disp: 90 tablet, Rfl: 1   methylPREDNISolone (MEDROL DOSEPAK) 4 MG TBPK tablet, As directed, Disp: 1 each, Rfl: 0   metoprolol succinate (TOPROL-XL) 50 MG 24 hr tablet, Take 1 tablet (50 mg total) by mouth daily. Take with or immediately following a meal., Disp: 90 tablet, Rfl: 1   NEXIUM 40 MG capsule, TAKE 1 CAPSULE BY MOUTH EVERY DAY, Disp: 90 capsule, Rfl: 1   pregabalin (LYRICA) 150 MG capsule, Take 1 capsule (150 mg total) by mouth 3 (three) times daily., Disp: 270 capsule, Rfl:  1   traMADol (ULTRAM) 50 MG tablet, Take 1 tablet (50 mg total) by mouth every 8 (eight) hours as needed., Disp: 30 tablet, Rfl: 1    Assessment & Plan:  Right sided herpes shingles.   Is already on lyrica 150 mg tid continue this.  Start pt on medrol dose pak.  Is on valtrex 1gm bid x 7 days.  Pt is stressed out about her husbands recent death.   Problem List Items Addressed This Visit   None    No orders of the defined types were placed in this encounter.    Meds ordered this encounter  Medications   methylPREDNISolone (MEDROL DOSEPAK) 4 MG TBPK tablet    Sig: As directed    Dispense:  1 each    Refill:  0   acyclovir ointment (ZOVIRAX) 5 %    Sig: Apply 1 application topically every 3 (three) hours.    Dispense:  5 g    Refill:  0     Follow up plan: No follow-ups on file.

## 2020-10-10 ENCOUNTER — Telehealth: Payer: Self-pay

## 2020-10-10 NOTE — Telephone Encounter (Signed)
PA submitted via cover my meds for acyclovir 5% via cover my meds.  Key: [id number]

## 2020-10-16 ENCOUNTER — Other Ambulatory Visit: Payer: Self-pay

## 2020-10-16 ENCOUNTER — Ambulatory Visit: Payer: Medicare PPO | Admitting: Nurse Practitioner

## 2020-10-16 ENCOUNTER — Encounter: Payer: Self-pay | Admitting: Nurse Practitioner

## 2020-10-16 VITALS — BP 108/62 | HR 55 | Temp 98.2°F | Ht 62.09 in | Wt 309.0 lb

## 2020-10-16 DIAGNOSIS — B029 Zoster without complications: Secondary | ICD-10-CM | POA: Diagnosis not present

## 2020-10-16 MED ORDER — CYCLOBENZAPRINE HCL 5 MG PO TABS: 5.0000 mg | ORAL_TABLET | Freq: Three times a day (TID) | ORAL | 1 refills | Status: DC | PRN

## 2020-10-16 NOTE — Progress Notes (Signed)
BP 108/62   Pulse (!) 55   Temp 98.2 F (36.8 C) (Oral)   Ht 5' 2.09" (1.577 m)   Wt (!) 309 lb (140.2 kg)   SpO2 96%   BMI 56.36 kg/m    Subjective:    Patient ID: Anna Juarez, female    DOB: 1950/07/20, 70 y.o.   MRN: [id number]  HPI: Anna Juarez is a 70 y.o. female  Chief Complaint  Patient presents with   Herpes Zoster    Patient states that the outside of skin is better, but her head still hurts and inside of her neck still hurts.   Patient presents to clinic today to follow up on shingles.  Patient states the outside of her her skin is better but Anna Juarez is having a lot of pain in her head and neck.  Patient feels like everything in her neck is very tight.  Anna Juarez is having a difficult time sleeping due to the pain.    Relevant past medical, surgical, family and social history reviewed and updated as indicated. Interim medical history since our last visit reviewed. Allergies and medications reviewed and updated.  Review of Systems  Musculoskeletal:  Positive for neck pain.  Neurological:        Head pain and burning.  Psychiatric/Behavioral:  Positive for sleep disturbance.    Per HPI unless specifically indicated above     Objective:    BP 108/62   Pulse (!) 55   Temp 98.2 F (36.8 C) (Oral)   Ht 5' 2.09" (1.577 m)   Wt (!) 309 lb (140.2 kg)   SpO2 96%   BMI 56.36 kg/m   Wt Readings from Last 3 Encounters:  10/16/20 (!) 309 lb (140.2 kg)  10/09/20 (!) 309 lb (140.2 kg)  10/04/20 (!) 309 lb 6.4 oz (140.3 kg)    Physical Exam Vitals and nursing note reviewed.  Constitutional:      General: Anna Juarez is not in acute distress.    Appearance: Normal appearance. Anna Juarez is normal weight. Anna Juarez is not ill-appearing, toxic-appearing or diaphoretic.  HENT:     Head: Normocephalic.     Right Ear: External ear normal.     Left Ear: External ear normal.     Nose: Nose normal.     Mouth/Throat:     Mouth: Mucous membranes are moist.     Pharynx: Oropharynx is  clear.  Eyes:     General:        Right eye: No discharge.        Left eye: No discharge.     Extraocular Movements: Extraocular movements intact.     Conjunctiva/sclera: Conjunctivae normal.     Pupils: Pupils are equal, round, and reactive to light.  Cardiovascular:     Rate and Rhythm: Normal rate and regular rhythm.     Heart sounds: No murmur heard. Pulmonary:     Effort: Pulmonary effort is normal. No respiratory distress.     Breath sounds: Normal breath sounds. No wheezing or rales.  Musculoskeletal:     Cervical back: Normal range of motion and neck supple.  Skin:    General: Skin is warm and dry.     Capillary Refill: Capillary refill takes less than 2 seconds.     Findings: Rash (small  quarter sized cluster rash on right shoulder) present.  Neurological:     General: No focal deficit present.     Mental Status: Anna Juarez is alert and oriented to person, place,  and time. Mental status is at baseline.  Psychiatric:        Mood and Affect: Mood normal.        Behavior: Behavior normal.        Thought Content: Thought content normal.        Judgment: Judgment normal.    Results for orders placed or performed in visit on 10/04/20  WET PREP FOR [place], YEAST, CLUE   Specimen: Sterile Swab   Sterile Swab  Result Value Ref Range   Trichomonas Exam Negative Negative   Yeast Exam Positive (A) Negative   Clue Cell Exam Negative Negative  Urinalysis, Routine w reflex microscopic  Result Value Ref Range   Specific Gravity, UA 1.010 1.005 - 1.030   pH, UA 5.0 5.0 - 7.5   Color, UA Yellow Yellow   Appearance Ur Hazy (A) Clear   Leukocytes,UA Negative Negative   Protein,UA Negative Negative/Trace   Glucose, UA 3+ (A) Negative   Ketones, UA Negative Negative   RBC, UA Negative Negative   Bilirubin, UA Negative Negative   Urobilinogen, Ur 0.2 0.2 - 1.0 mg/dL   Nitrite, UA Negative Negative      Assessment & Plan:   Problem List Items Addressed This Visit   None Visit  Diagnoses     Herpes zoster without complication    -  Primary   Recommend using Tramadol PRN for pain. Continue with Lyrica. Sent flexeril due to muscles feeling tight. Recommend cold wash cloth over area to help with pain.        Follow up plan: Return if symptoms worsen or fail to improve.    A total of 20 minutes were spent on this encounter today.  When total time is documented, this includes both the face-to-face and non-face-to-face time personally spent before, during and after the visit on the date of the encounter discussing treatment for shingles.

## 2020-10-31 ENCOUNTER — Telehealth: Payer: Self-pay

## 2020-10-31 NOTE — Chronic Care Management (AMB) (Signed)
    Chronic Care Management Pharmacy Assistant   Name: Anna Juarez  MRN: [id number] DOB: Aug 21, 1950  Reason for Encounter: Med Sync Call   Medications: Outpatient Encounter Medications as of 10/31/2020  Medication Sig   acetaminophen (TYLENOL) 500 MG tablet Take 500 mg by mouth as needed.   benazepril (LOTENSIN) 40 MG tablet TAKE 1 TABLET BY MOUTH EVERY DAY   cyclobenzaprine (FLEXERIL) 5 MG tablet Take 1 tablet (5 mg total) by mouth 3 (three) times daily as needed for muscle spasms.   dapagliflozin propanediol (FARXIGA) 10 MG TABS tablet Take 1 tablet (10 mg total) by mouth daily before breakfast.   fluconazole (DIFLUCAN) 150 MG tablet Take 1 tablet every 3 days until gone.   glipiZIDE (GLUCOTROL) 5 MG tablet Take 1 tablet (5 mg total) by mouth 2 (two) times daily before a meal.   glucose blood (ONETOUCH ULTRA) test strip 1 each by Other route as needed for other. Use as instructed   lovastatin (MEVACOR) 40 MG tablet TAKE 1 TABLET BY MOUTH EVERYDAY AT BEDTIME   metoprolol succinate (TOPROL-XL) 50 MG 24 hr tablet Take 1 tablet (50 mg total) by mouth daily. Take with or immediately following a meal.   NEXIUM 40 MG capsule TAKE 1 CAPSULE BY MOUTH EVERY DAY   pregabalin (LYRICA) 150 MG capsule Take 1 capsule (150 mg total) by mouth 3 (three) times daily.   traMADol (ULTRAM) 50 MG tablet Take 1 tablet (50 mg total) by mouth every 8 (eight) hours as needed.   No facility-administered encounter medications on file as of 10/31/2020.    Reviewed chart for medication changes ahead of medication coordination call.  No OVs, Consults, or hospital visits since last care coordination call/Pharmacist visit. (If appropriate, list visit date, provider name)  No medication changes indicated OR if recent visit, treatment plan here.  BP Readings from Last 3 Encounters:  10/16/20 108/62  10/09/20 112/70  10/04/20 117/69    Lab Results  Component Value Date   HGBA1C 7.9 (H) 09/25/2020      Patient obtains medications through [facility]  90 Days   Last adherence delivery included: (medication name and frequency)  Patient declined (meds) last month due to PRN use/additional supply on hand. Explanation of abundance on hand (ie #30 due to overlapping fills or previous adherence issues etc)  Patient is due for next adherence delivery on: 11/01/2020. Called patient and reviewed medications and coordinated delivery. Unable to reach patient to verify medication delivery.  This delivery to include: Patient will need a short fill of (med), prior to adherence delivery. (To align with sync date or if PRN med) N/A  Coordinated acute fill for (med) to be delivered (date). N/A  Patient declined the following medications (meds) due to (reason)N/A  Patient needs refills for N/A  Confirmed delivery date of: unable to reach patient to verify medication delivery.

## 2020-12-18 ENCOUNTER — Ambulatory Visit
Admission: RE | Admit: 2020-12-18 | Discharge: 2020-12-18 | Disposition: A | Discharge status: Home / Self Care | Payer: Medicare PPO | Source: Ambulatory Visit | Attending: Nurse Practitioner | Admitting: Nurse Practitioner

## 2020-12-18 ENCOUNTER — Other Ambulatory Visit: Payer: Self-pay

## 2020-12-18 DIAGNOSIS — Z1231 Encounter for screening mammogram for malignant neoplasm of breast: Secondary | ICD-10-CM | POA: Diagnosis not present

## 2020-12-18 DIAGNOSIS — Z0000 Encounter for general adult medical examination without abnormal findings: Secondary | ICD-10-CM

## 2020-12-18 DIAGNOSIS — Z780 Asymptomatic menopausal state: Secondary | ICD-10-CM | POA: Diagnosis not present

## 2020-12-18 DIAGNOSIS — E119 Type 2 diabetes mellitus without complications: Secondary | ICD-10-CM | POA: Diagnosis not present

## 2020-12-18 DIAGNOSIS — M810 Age-related osteoporosis without current pathological fracture: Secondary | ICD-10-CM

## 2020-12-18 DIAGNOSIS — Z13820 Encounter for screening for osteoporosis: Secondary | ICD-10-CM | POA: Diagnosis not present

## 2020-12-20 NOTE — Progress Notes (Signed)
Hi Ms. President. Your Bone Density scan was normal. Just recommend an over the counter vitamin D supplement. Please let me know If you have any questions.

## 2020-12-23 NOTE — Progress Notes (Signed)
Please let patient know her Mammogram did not show any evidence of a malignancy.  The recommendation is to repeat the Mammogram in 1 year.  

## 2020-12-27 ENCOUNTER — Encounter: Payer: Self-pay | Admitting: Nurse Practitioner

## 2020-12-27 ENCOUNTER — Ambulatory Visit
Admission: RE | Admit: 2020-12-27 | Discharge: 2020-12-27 | Disposition: A | Discharge status: Home / Self Care | Payer: Medicare PPO | Source: Ambulatory Visit | Attending: Nurse Practitioner | Admitting: Nurse Practitioner

## 2020-12-27 ENCOUNTER — Other Ambulatory Visit: Payer: Self-pay

## 2020-12-27 ENCOUNTER — Ambulatory Visit: Payer: Medicare PPO | Admitting: Nurse Practitioner

## 2020-12-27 ENCOUNTER — Ambulatory Visit
Admission: RE | Admit: 2020-12-27 | Discharge: 2020-12-27 | Disposition: A | Discharge status: Home / Self Care | Payer: Medicare PPO | Attending: Nurse Practitioner | Admitting: Nurse Practitioner

## 2020-12-27 VITALS — BP 129/73 | HR 58 | Temp 98.4°F | Wt 302.2 lb

## 2020-12-27 DIAGNOSIS — M545 Low back pain, unspecified: Secondary | ICD-10-CM

## 2020-12-27 DIAGNOSIS — E1142 Type 2 diabetes mellitus with diabetic polyneuropathy: Secondary | ICD-10-CM

## 2020-12-27 DIAGNOSIS — E785 Hyperlipidemia, unspecified: Secondary | ICD-10-CM

## 2020-12-27 DIAGNOSIS — E1169 Type 2 diabetes mellitus with other specified complication: Secondary | ICD-10-CM | POA: Diagnosis not present

## 2020-12-27 DIAGNOSIS — R1011 Right upper quadrant pain: Secondary | ICD-10-CM

## 2020-12-27 DIAGNOSIS — M25559 Pain in unspecified hip: Secondary | ICD-10-CM

## 2020-12-27 DIAGNOSIS — N1832 Chronic kidney disease, stage 3b: Secondary | ICD-10-CM | POA: Diagnosis not present

## 2020-12-27 DIAGNOSIS — G8929 Other chronic pain: Secondary | ICD-10-CM

## 2020-12-27 DIAGNOSIS — I152 Hypertension secondary to endocrine disorders: Secondary | ICD-10-CM

## 2020-12-27 DIAGNOSIS — E1159 Type 2 diabetes mellitus with other circulatory complications: Secondary | ICD-10-CM | POA: Diagnosis not present

## 2020-12-27 LAB — BAYER DCA HB A1C WAIVED: HB A1C (BAYER DCA - WAIVED): 7.9 % — ABNORMAL HIGH (ref 4.8–5.6)

## 2020-12-27 MED ORDER — TRAZODONE HCL 50 MG PO TABS: 25.0000 mg | ORAL_TABLET | Freq: Every evening | ORAL | 0 refills | Status: DC | PRN

## 2020-12-27 MED ORDER — PANTOPRAZOLE SODIUM 20 MG PO TBEC: 20.0000 mg | DELAYED_RELEASE_TABLET | Freq: Every day | ORAL | 1 refills | Status: DC

## 2020-12-27 NOTE — Assessment & Plan Note (Signed)
Patient is having some low blood sugars due to not being able to eat.  Will draw A1c today and change Glipizide to daily if A1c is under control.  Will also work to get stomach pain better controlled.

## 2020-12-27 NOTE — Progress Notes (Signed)
Please let patient know that her A1c remains 7.9.  I don't want to stop her medications at this time. I want to get her stomach pain under control. Please let me know if she has any questions.

## 2020-12-27 NOTE — Assessment & Plan Note (Signed)
Labs ordered today. Continue to follow up with Nephrology.

## 2020-12-27 NOTE — Assessment & Plan Note (Signed)
Chronic.  Controlled.  Continue with current medication regimen.  Labs ordered today.  Return to clinic in 3 months for reevaluation.  Call sooner if concerns arise.   

## 2020-12-27 NOTE — Assessment & Plan Note (Signed)
Chronic.  Refilled Tramadol during visit today.  Out sooner than normal due to using it for her tooth pain when she had an abscessed tooth.

## 2020-12-27 NOTE — Progress Notes (Signed)
BP 129/73   Pulse (!) 58   Temp 98.4 F (36.9 C) (Oral)   Wt (!) 302 lb 3.2 oz (137.1 kg)   SpO2 98%   BMI 55.12 kg/m    Subjective:    Patient ID: Anna Juarez, female    DOB: May 30, 1950, 70 y.o.   MRN: [id number]  HPI: Anna Juarez is a 70 y.o. female  Chief Complaint  Patient presents with   Diabetes   Hyperlipidemia   Hypertension   Chronic Kidney Disease   Abdominal Pain    Patient states she has some days where her stomach hurts really bad, and some days it doesn't. Patient states she follows up with GI doctor in November. Patient states her stomach hurts after she eats or drinks. Patient states one day she couldn't eat anything and she just "crashed."   Immunizations    Patient states she would like to discuss having a Shingles vaccine as she experienced a Shingles outbreak and says that was the worse pain she has ever felt.    DIABETES Hypoglycemic episodes:yes- 1 episode about a week ago Polydipsia/polyuria: no Visual disturbance: no Chest pain: no Paresthesias: no Glucose Monitoring: yes  Accucheck frequency: Daily  Fasting glucose:   Post prandial:  Evening:  Before meals: Taking Insulin?: no  Long acting insulin:  Short acting insulin: Blood Pressure Monitoring: not checking Retinal Examination: Up to Date Foot Exam: Not up to Date Diabetic Education: Not Completed Pneumovax: Up to Date Influenza: Not up to Date Aspirin: no  HYPERTENSION / HYPERLIPIDEMIA Satisfied with current treatment? no Duration of hypertension: years BP monitoring frequency: not checking BP range:  BP medication side effects: no Past BP meds: benazepril Duration of hyperlipidemia: years Cholesterol medication side effects: no Cholesterol supplements: none Past cholesterol medications: lovastatin Medication compliance: excellent compliance Aspirin: no Recent stressors: no Recurrent headaches: no Visual changes: no Palpitations: no Dyspnea: no Chest pain:  no Lower extremity edema: no Dizzy/lightheaded: no  CHRONIC KIDNEY DISEASE CKD status: controlled Medications renally dose: yes Previous renal evaluation: yes Pneumovax:  Up to Date Influenza Vaccine:  Not up to Date  STOMACH PAIN Patient states when she eats or drinks anything after breakfast she has stomach. Patient states it is epigastric pain and comes over to the right side.  Patient states burping helps if she is able to burp.  She has bloating.  Patient has an appointment with GI but it isn't until November.   GRIEF Patient states she has been struggling with sleep and feeling sad since her husband passed.  Patient felt like it was going to get better but it hasn't really.  She gets lonely in the evenings and only sleeps 2-3 hours and can't go back to sleep.   HIP PAIN Patient states she has been having hip pain.  At times, it feels like it is going to give out.  States that if she turns too quickly she has to hold onto someone so she doesn't fall.   Relevant past medical, surgical, family and social history reviewed and updated as indicated. Interim medical history since our last visit reviewed. Allergies and medications reviewed and updated.  Review of Systems  Eyes:  Negative for visual disturbance.  Respiratory:  Negative for chest tightness and shortness of breath.   Cardiovascular:  Negative for chest pain, palpitations and leg swelling.  Gastrointestinal:  Positive for abdominal pain.       Bloating, gas  Endocrine: Negative for polydipsia and polyuria.  Musculoskeletal:  Hip pain  Neurological:  Negative for dizziness, light-headedness, numbness and headaches.  Psychiatric/Behavioral:  Positive for dysphoric mood and sleep disturbance.    Per HPI unless specifically indicated above     Objective:    BP 129/73   Pulse (!) 58   Temp 98.4 F (36.9 C) (Oral)   Wt (!) 302 lb 3.2 oz (137.1 kg)   SpO2 98%   BMI 55.12 kg/m   Wt Readings from Last 3  Encounters:  12/27/20 (!) 302 lb 3.2 oz (137.1 kg)  10/16/20 (!) 309 lb (140.2 kg)  10/09/20 (!) 309 lb (140.2 kg)    Physical Exam Vitals and nursing note reviewed.  Constitutional:      General: She is not in acute distress.    Appearance: Normal appearance. She is obese. She is not ill-appearing, toxic-appearing or diaphoretic.  HENT:     Head: Normocephalic.     Right Ear: External ear normal.     Left Ear: External ear normal.     Nose: Nose normal.     Mouth/Throat:     Mouth: Mucous membranes are moist.     Pharynx: Oropharynx is clear.  Eyes:     General:        Right eye: No discharge.        Left eye: No discharge.     Extraocular Movements: Extraocular movements intact.     Conjunctiva/sclera: Conjunctivae normal.     Pupils: Pupils are equal, round, and reactive to light.  Cardiovascular:     Rate and Rhythm: Normal rate and regular rhythm.     Heart sounds: No murmur heard. Pulmonary:     Effort: Pulmonary effort is normal. No respiratory distress.     Breath sounds: Normal breath sounds. No wheezing or rales.  Abdominal:     Tenderness: There is abdominal tenderness in the right upper quadrant. There is no right CVA tenderness or guarding. Negative signs include Murphy's sign.  Musculoskeletal:     Cervical back: Normal range of motion and neck supple.  Skin:    General: Skin is warm and dry.     Capillary Refill: Capillary refill takes less than 2 seconds.  Neurological:     General: No focal deficit present.     Mental Status: She is alert and oriented to person, place, and time. Mental status is at baseline.  Psychiatric:        Mood and Affect: Mood normal.        Behavior: Behavior normal.        Thought Content: Thought content normal.        Judgment: Judgment normal.     Comments: tearful    Results for orders placed or performed in visit on 10/04/20  WET PREP FOR [place], YEAST, CLUE   Specimen: Sterile Swab   Sterile Swab  Result Value Ref  Range   Trichomonas Exam Negative Negative   Yeast Exam Positive (A) Negative   Clue Cell Exam Negative Negative  Urinalysis, Routine w reflex microscopic  Result Value Ref Range   Specific Gravity, UA 1.010 1.005 - 1.030   pH, UA 5.0 5.0 - 7.5   Color, UA Yellow Yellow   Appearance Ur Hazy (A) Clear   Leukocytes,UA Negative Negative   Protein,UA Negative Negative/Trace   Glucose, UA 3+ (A) Negative   Ketones, UA Negative Negative   RBC, UA Negative Negative   Bilirubin, UA Negative Negative   Urobilinogen, Ur 0.2 0.2 - 1.0 mg/dL  Nitrite, UA Negative Negative      Assessment & Plan:   Problem List Items Addressed This Visit       Cardiovascular and Mediastinum   Hypertension associated with diabetes ([place]) - Primary    Chronic.  Controlled.  Continue with current medication regimen.  Labs ordered today.  Return to clinic in 3 months for reevaluation.  Call sooner if concerns arise.        Relevant Orders   Comp Met (CMET)   Lipid Profile     Endocrine   DM type 2 with diabetic peripheral neuropathy ([place])    Patient is having some low blood sugars due to not being able to eat.  Will draw A1c today and change Glipizide to daily if A1c is under control.  Will also work to get stomach pain better controlled.       Relevant Medications   traZODone (DESYREL) 50 MG tablet   Other Relevant Orders   Comp Met (CMET)   Lipid Profile   Bayer DCA Hb A1c Waived (STAT)   Hyperlipidemia associated with type 2 diabetes mellitus ([facility])    Chronic.  Controlled.  Continue with current medication regimen.  Labs ordered today.  Return to clinic in 3 months for reevaluation.  Call sooner if concerns arise.        Relevant Orders   Comp Met (CMET)   Lipid Profile     Genitourinary   CKD (chronic kidney disease) stage 3, GFR 30-59 ml/min ([facility])    Labs ordered today. Continue to follow up with Nephrology.       Relevant Orders   Comp Met (CMET)   Lipid Profile     Other   Back  pain    Chronic.  Refilled Tramadol during visit today.  Out sooner than normal due to using it for her tooth pain when she had an abscessed tooth.       Other Visit Diagnoses     Hip pain       Xray ordered today. Will evaluate for Arthritis. May refer to Physical Therapy to strength hop muscles.    Relevant Orders   DG Hip Unilat W OR W/O Pelvis 2-3 Views Right   Right upper quadrant abdominal pain       Will change Nexium to Protonix. Will order [place] of RUQ to evaluate pain. Keep appointment with GI for November.   Relevant Orders   US Abdomen Limited RUQ (LIVER/GB)        Follow up plan: Return in about 2 weeks (around 01/10/2021) for Medication follow up.

## 2020-12-28 LAB — COMPREHENSIVE METABOLIC PANEL
ALT: 23 IU/L (ref 0–32)
AST: 35 IU/L (ref 0–40)
Albumin/Globulin Ratio: 1.5 (ref 1.2–2.2)
Albumin: 4.5 g/dL (ref 3.7–4.7)
Alkaline Phosphatase: 66 IU/L (ref 44–121)
BUN/Creatinine Ratio: 11 — ABNORMAL LOW (ref 12–28)
BUN: 17 mg/dL (ref 8–27)
Bilirubin Total: 0.4 mg/dL (ref 0.0–1.2)
CO2: 21 mmol/L (ref 20–29)
Calcium: 9.4 mg/dL (ref 8.7–10.3)
Chloride: 102 mmol/L (ref 96–106)
Creatinine, Ser: 1.55 mg/dL — ABNORMAL HIGH (ref 0.57–1.00)
Globulin, Total: 3 g/dL (ref 1.5–4.5)
Glucose: 181 mg/dL — ABNORMAL HIGH (ref 65–99)
Potassium: 5 mmol/L (ref 3.5–5.2)
Sodium: 142 mmol/L (ref 134–144)
Total Protein: 7.5 g/dL (ref 6.0–8.5)
eGFR: 35 mL/min/{1.73_m2} — ABNORMAL LOW (ref >59)

## 2020-12-28 LAB — LIPID PANEL
Chol/HDL Ratio: 2.9 ratio (ref 0.0–4.4)
Cholesterol, Total: 159 mg/dL (ref 100–199)
HDL: 54 mg/dL (ref >39)
LDL Chol Calc (NIH): 71 mg/dL (ref 0–99)
Triglycerides: 208 mg/dL — ABNORMAL HIGH (ref 0–149)
VLDL Cholesterol Cal: 34 mg/dL (ref 5–40)

## 2020-12-29 NOTE — Progress Notes (Signed)
Please let Anna Juarez know that her xray of her hip was negative.  I think it would be worth while for her to see Orthopedics to find out what is causing her hip pain. If she agrees, I will place the referral.

## 2020-12-29 NOTE — Progress Notes (Signed)
Please let patient know that her lab work remains stable.  A1c remains above goal at 7.9.  We will continue with the glipizide twice daily for now.  We need to figure out what is going on with her stomach and I believe her diabetes will improve as well.

## 2020-12-30 ENCOUNTER — Telehealth: Payer: Self-pay

## 2020-12-30 NOTE — Telephone Encounter (Signed)
Left message advising patient to review lab results on mychart and to call back with any questions.

## 2020-12-30 NOTE — Telephone Encounter (Signed)
-----   Message from [name], NP sent at 12/29/2020  8:40 PM EDT ----- Please let patient know that her lab work remains stable.  A1c remains above goal at 7.9.  We will continue with the glipizide twice daily for now.  We need to figure out what is going on with her stomach and I believe her diabetes will improve as well.

## 2020-12-31 ENCOUNTER — Telehealth: Payer: Self-pay | Admitting: *Deleted

## 2020-12-31 NOTE — Telephone Encounter (Signed)
Per colette- patient called and requested apt with oncology to follow-up. 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  Per Dr. Jacinto Reap- please schedule apts in approx. 2 weeks with labs after the md apt.

## 2021-01-01 ENCOUNTER — Telehealth: Payer: Self-pay

## 2021-01-01 NOTE — Chronic Care Management (AMB) (Signed)
    Chronic Care Management Pharmacy Assistant   Name: Jessee Newnam  MRN: [id number] DOB: 05/31/1950  Anna Juarez is an 70 y.o. year old female who presents for his follow-up CCM visit with the clinical pharmacist.  Reason for Encounter: Disease State General   Recent office visits:   12/27/20 [name] NP PCP- pt seen for HTN. Labs and xrays were ordered and pt was started on Pantoprazole Sodium 20 mg daily and Trazadone HCI 25-50 mg at bedtime. Medication follow up in 2 weeks.  12/18/20 [name] NP - pt seen for Annual exam. Pt had Mammogram and dexa scan done.  Recent consult visits:  [facility] visits:  None in previous 6 months  Medications: Outpatient Encounter Medications as of 01/01/2021  Medication Sig   acetaminophen (TYLENOL) 500 MG tablet Take 500 mg by mouth as needed.   benazepril (LOTENSIN) 40 MG tablet TAKE 1 TABLET BY MOUTH EVERY DAY   cyclobenzaprine (FLEXERIL) 5 MG tablet Take 1 tablet (5 mg total) by mouth 3 (three) times daily as needed for muscle spasms.   dapagliflozin propanediol (FARXIGA) 10 MG TABS tablet Take 1 tablet (10 mg total) by mouth daily before breakfast.   fluconazole (DIFLUCAN) 150 MG tablet Take 1 tablet every 3 days until gone.   glipiZIDE (GLUCOTROL) 5 MG tablet Take 1 tablet (5 mg total) by mouth 2 (two) times daily before a meal.   glucose blood (ONETOUCH ULTRA) test strip 1 each by Other route as needed for other. Use as instructed   lovastatin (MEVACOR) 40 MG tablet TAKE 1 TABLET BY MOUTH EVERYDAY AT BEDTIME   metoprolol succinate (TOPROL-XL) 50 MG 24 hr tablet Take 1 tablet (50 mg total) by mouth daily. Take with or immediately following a meal.   pantoprazole (PROTONIX) 20 MG tablet Take 1 tablet (20 mg total) by mouth daily.   pregabalin (LYRICA) 150 MG capsule Take 1 capsule (150 mg total) by mouth 3 (three) times daily.   traMADol (ULTRAM) 50 MG tablet Take 1 tablet (50 mg total) by mouth every 8 (eight)  hours as needed.   traZODone (DESYREL) 50 MG tablet Take 0.5-1 tablets (25-50 mg total) by mouth at bedtime as needed for sleep.   No facility-administered encounter medications on file as of 01/01/2021.    Have you had any problems recently with your health? Pt stated she is in a lot of in both hips when she moves. Pt has Stage 3 Kidney Failure and can only take her Tylenol or Tramadol.   Have you had any problems with your pharmacy? Pt stated she has no issues with her pharmacy at the moment.  What issues or side effects are you having with your medications? Pt stated she is not having any side effects that she is aware of.   What would you like me to pass along to [facility] for them to help you with?  Pt stated there is nothing she needs at this time, just someone to talk to. I told her she can call us anytime.  What can we do to take care of you better? Pt stated she is fine she just needed to vent so our call helped.  Pt confirmed appt OCT 11th @ 1 pm  Star Rating Drugs: Lovastatin 40 mg tab last filled 12/21/19 90 DS  [facility] Clinical Pharmacist Assistant [phone]

## 2021-01-04 DIAGNOSIS — S73199A Other sprain of unspecified hip, initial encounter: Secondary | ICD-10-CM

## 2021-01-04 HISTORY — DX: Other sprain of unspecified hip, initial encounter: S73.199A

## 2021-01-07 ENCOUNTER — Other Ambulatory Visit: Payer: Self-pay

## 2021-01-07 ENCOUNTER — Ambulatory Visit: Payer: Medicare PPO

## 2021-01-07 ENCOUNTER — Ambulatory Visit
Admission: RE | Admit: 2021-01-07 | Discharge: 2021-01-07 | Disposition: A | Discharge status: Home / Self Care | Payer: Medicare PPO | Source: Ambulatory Visit | Attending: Nurse Practitioner | Admitting: Nurse Practitioner

## 2021-01-07 DIAGNOSIS — R1011 Right upper quadrant pain: Secondary | ICD-10-CM | POA: Insufficient documentation

## 2021-01-08 ENCOUNTER — Telehealth: Payer: Self-pay | Admitting: Nurse Practitioner

## 2021-01-08 NOTE — Telephone Encounter (Signed)
Spoke with patient about results and next steps.

## 2021-01-08 NOTE — Progress Notes (Signed)
Please let her know that I have spoken with the referral coordinator to see if we can get something quickly.

## 2021-01-08 NOTE — Progress Notes (Signed)
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  This could be causing her pain.  I want her to see a Gastroenterologist for further evaluation.  She also has fatty liver.  We will monitor that over time.  Please let me know if patient has any questions.

## 2021-01-08 NOTE — Addendum Note (Signed)
Addended by: [name] on: 01/08/2021 08:46 AM   Modules accepted: Orders

## 2021-01-08 NOTE — Telephone Encounter (Signed)
Pt is calling and has reviewed her abd ultrasound on mychart and per pt it showed she has a stone and would like to know the next step also would like karen to return her call

## 2021-01-09 ENCOUNTER — Telehealth: Payer: Self-pay | Admitting: Gastroenterology

## 2021-01-09 NOTE — Telephone Encounter (Signed)
Right upper quadrant abdominal pain.Marland KitchenMarland KitchenMarland KitchenPt wants to know if she could be worked in for a sooner appt. Clinical staff will follow up with Patient.

## 2021-01-09 NOTE — Telephone Encounter (Signed)
Returned pt's call and rescheduled pt to Oct 17th at 8:45am with Dr. Bonna Gains.

## 2021-01-13 ENCOUNTER — Other Ambulatory Visit: Payer: Self-pay

## 2021-01-13 ENCOUNTER — Encounter: Payer: Self-pay | Admitting: Nurse Practitioner

## 2021-01-13 ENCOUNTER — Ambulatory Visit: Payer: Medicare PPO | Admitting: Nurse Practitioner

## 2021-01-13 VITALS — BP 132/76 | HR 55 | Ht 62.0 in | Wt 301.0 lb

## 2021-01-13 DIAGNOSIS — R1011 Right upper quadrant pain: Secondary | ICD-10-CM | POA: Diagnosis not present

## 2021-01-13 DIAGNOSIS — G479 Sleep disorder, unspecified: Secondary | ICD-10-CM

## 2021-01-13 DIAGNOSIS — G8929 Other chronic pain: Secondary | ICD-10-CM

## 2021-01-13 DIAGNOSIS — M545 Low back pain, unspecified: Secondary | ICD-10-CM

## 2021-01-13 MED ORDER — TRAMADOL HCL 50 MG PO TABS: 50.0000 mg | ORAL_TABLET | Freq: Three times a day (TID) | ORAL | 1 refills | Status: DC | PRN

## 2021-01-13 MED ORDER — TRAZODONE HCL 100 MG PO TABS: 100.0000 mg | ORAL_TABLET | Freq: Every evening | ORAL | 1 refills | Status: DC | PRN

## 2021-01-13 NOTE — Progress Notes (Signed)
BP 132/76   Pulse (!) 55   Ht '5\' 2"'  (1.575 m)   Wt (!) 301 lb (136.5 kg)   BMI 55.05 kg/m    Subjective:    Patient ID: Anna Juarez, female    DOB: 09/20/1950, 70 y.o.   MRN: [id number]  HPI: Anna Juarez is a 70 y.o. female  Chief Complaint  Patient presents with   Gastroesophageal Reflux   Insomnia    STOMACH PAIN Patient states the stomach pain has continued.  She does have an appointment with the GI doctor on Monday.  Patient states it takes her breath away.  She isn't able to eat or drink anything after breakfast time.  She is very thirsty right now due to not being able to drink water.   GRIEF Patient states she has gone from 3 hours to 5 hours of sleep.  She is taking trazadone 27m.  Would like to increase it to 2 tabs.  Her mood is improving due to improving sleeping.  HIP PAIN Patient states some days she has pain when she has her foot in a certain way then she has a sharp pain.   Relevant past medical, surgical, family and social history reviewed and updated as indicated. Interim medical history since our last visit reviewed. Allergies and medications reviewed and updated.  Review of Systems  Eyes:  Negative for visual disturbance.  Respiratory:  Negative for chest tightness and shortness of breath.   Cardiovascular:  Negative for chest pain, palpitations and leg swelling.  Gastrointestinal:  Positive for abdominal pain.       Bloating, gas  Endocrine: Negative for polydipsia and polyuria.  Musculoskeletal:        Hip pain  Neurological:  Negative for dizziness, light-headedness, numbness and headaches.  Psychiatric/Behavioral:  Positive for sleep disturbance.    Per HPI unless specifically indicated above     Objective:    BP 132/76   Pulse (!) 55   Ht '5\' 2"'  (1.575 m)   Wt (!) 301 lb (136.5 kg)   BMI 55.05 kg/m   Wt Readings from Last 3 Encounters:  01/13/21 (!) 301 lb (136.5 kg)  12/27/20 (!) 302 lb 3.2 oz (137.1 kg)  10/16/20 (!)  309 lb (140.2 kg)    Physical Exam Vitals and nursing note reviewed.  Constitutional:      General: She is not in acute distress.    Appearance: Normal appearance. She is obese. She is not ill-appearing, toxic-appearing or diaphoretic.  HENT:     Head: Normocephalic.     Right Ear: External ear normal.     Left Ear: External ear normal.     Nose: Nose normal.     Mouth/Throat:     Mouth: Mucous membranes are moist.     Pharynx: Oropharynx is clear.  Eyes:     General:        Right eye: No discharge.        Left eye: No discharge.     Extraocular Movements: Extraocular movements intact.     Conjunctiva/sclera: Conjunctivae normal.     Pupils: Pupils are equal, round, and reactive to light.  Cardiovascular:     Rate and Rhythm: Normal rate and regular rhythm.     Heart sounds: No murmur heard. Pulmonary:     Effort: Pulmonary effort is normal. No respiratory distress.     Breath sounds: Normal breath sounds. No wheezing or rales.  Abdominal:     Tenderness: There is  abdominal tenderness in the right upper quadrant. There is no right CVA tenderness or guarding. Negative signs include Murphy's sign.  Musculoskeletal:     Cervical back: Normal range of motion and neck supple.  Skin:    General: Skin is warm and dry.     Capillary Refill: Capillary refill takes less than 2 seconds.  Neurological:     General: No focal deficit present.     Mental Status: She is alert and oriented to person, place, and time. Mental status is at baseline.  Psychiatric:        Mood and Affect: Mood normal.        Behavior: Behavior normal.        Thought Content: Thought content normal.        Judgment: Judgment normal.     Comments: tearful    Results for orders placed or performed in visit on 12/27/20  Comp Met (CMET)  Result Value Ref Range   Glucose 181 (H) 65 - 99 mg/dL   BUN 17 8 - 27 mg/dL   Creatinine, Ser 1.55 (H) 0.57 - 1.00 mg/dL   eGFR 35 (L) >59 mL/min/1.73   BUN/Creatinine  Ratio 11 (L) 12 - 28   Sodium 142 134 - 144 mmol/L   Potassium 5.0 3.5 - 5.2 mmol/L   Chloride 102 96 - 106 mmol/L   CO2 21 20 - 29 mmol/L   Calcium 9.4 8.7 - 10.3 mg/dL   Total Protein 7.5 6.0 - 8.5 g/dL   Albumin 4.5 3.7 - 4.7 g/dL   Globulin, Total 3.0 1.5 - 4.5 g/dL   Albumin/Globulin Ratio 1.5 1.2 - 2.2   Bilirubin Total 0.4 0.0 - 1.2 mg/dL   Alkaline Phosphatase 66 44 - 121 IU/L   AST 35 0 - 40 IU/L   ALT 23 0 - 32 IU/L  Lipid Profile  Result Value Ref Range   Cholesterol, Total 159 100 - 199 mg/dL   Triglycerides 208 (H) 0 - 149 mg/dL   HDL 54 >39 mg/dL   VLDL Cholesterol Cal 34 5 - 40 mg/dL   LDL Chol Calc (NIH) 71 0 - 99 mg/dL   Chol/HDL Ratio 2.9 0.0 - 4.4 ratio  Bayer DCA Hb A1c Waived (STAT)  Result Value Ref Range   HB A1C (BAYER DCA - WAIVED) 7.9 (H) 4.8 - 5.6 %      Assessment & Plan:   Problem List Items Addressed This Visit       Other   Back pain - Primary    Chronic. Ongoing. Continues to suffer from hip pain and it is worsening.  Xray was negative and showed no evidence of arthritis.  Has seen Orthopedics in the past. The pain has been going on since at least 2018. She occasionally takes Tramadol for the pain.      Relevant Medications   traMADol (ULTRAM) 50 MG tablet   Other Relevant Orders   MR HIP LEFT WO CONTRAST   Other Visit Diagnoses     Sleep disturbance       Trazadone 27m has improved sleep from 3-5 hours. Would like to increase to 1028m New prescription sent to pharmacy.    Right upper quadrant abdominal pain       Patient has upcoming appointment with GI to evaluate abdominal pain.  Will follow up with patient after appointment.        Follow up plan: Return in about 2 months (around 03/15/2021) for GI and hip.  A total of 30 minutes were spent on this encounter today.  When total time is documented, this includes both the face-to-face and non-face-to-face time personally spent before, during and after the visit on the date of  the encounter plan of care regarding hip pain and increasing trazodone dose.

## 2021-01-13 NOTE — Assessment & Plan Note (Signed)
Chronic. Ongoing. Continues to suffer from hip pain and it is worsening.  Xray was negative and showed no evidence of arthritis.  Has seen Orthopedics in the past. The pain has been going on since at least 2018. She occasionally takes Tramadol for the pain.

## 2021-01-14 ENCOUNTER — Telehealth: Payer: Medicare PPO

## 2021-01-17 ENCOUNTER — Other Ambulatory Visit: Payer: Medicare PPO

## 2021-01-17 ENCOUNTER — Ambulatory Visit: Payer: Medicare PPO | Admitting: Internal Medicine

## 2021-01-17 ENCOUNTER — Other Ambulatory Visit: Payer: Self-pay | Admitting: *Deleted

## 2021-01-17 DIAGNOSIS — D472 Monoclonal gammopathy: Secondary | ICD-10-CM

## 2021-01-20 ENCOUNTER — Encounter: Payer: Self-pay | Admitting: Gastroenterology

## 2021-01-20 ENCOUNTER — Ambulatory Visit: Payer: Medicare PPO | Admitting: Gastroenterology

## 2021-01-20 ENCOUNTER — Other Ambulatory Visit: Payer: Self-pay

## 2021-01-20 ENCOUNTER — Telehealth: Payer: Self-pay | Admitting: Nurse Practitioner

## 2021-01-20 ENCOUNTER — Other Ambulatory Visit: Payer: Self-pay | Admitting: Nurse Practitioner

## 2021-01-20 DIAGNOSIS — E1142 Type 2 diabetes mellitus with diabetic polyneuropathy: Secondary | ICD-10-CM

## 2021-01-20 DIAGNOSIS — R748 Abnormal levels of other serum enzymes: Secondary | ICD-10-CM | POA: Diagnosis not present

## 2021-01-20 DIAGNOSIS — K8020 Calculus of gallbladder without cholecystitis without obstruction: Secondary | ICD-10-CM

## 2021-01-20 DIAGNOSIS — K760 Fatty (change of) liver, not elsewhere classified: Secondary | ICD-10-CM | POA: Diagnosis not present

## 2021-01-20 DIAGNOSIS — R1011 Right upper quadrant pain: Secondary | ICD-10-CM | POA: Diagnosis not present

## 2021-01-20 DIAGNOSIS — R0602 Shortness of breath: Secondary | ICD-10-CM

## 2021-01-20 NOTE — Telephone Encounter (Addendum)
Please let patient know that her Lyrica has been sent to the pharmacy.    The GI doctor reached out and recommended a Cardiac workup or EKG.  Can you make her an appt to come in for a pre op so we can do the EKG and pre op visit in preparation for her to see GI?

## 2021-01-20 NOTE — Progress Notes (Signed)
Anna Juarez [address]  [place], [place] [address]  Main: [phone]  Fax: [phone]   Gastroenterology Consultation  Referring Provider:     [name], NP Primary Care Physician:  [name], NP Reason for Consultation:     Right upper quadrant pain        HPI:    Chief Complaint  Patient presents with   New Patient (Initial Visit)   Abdominal Pain    RUQ x 2 month also reports nausea w/o vomiting... denies changed in BM, having daily normal BM...     Anna Juarez is a 70 y.o. y/o female referred for consultation & management  by Dr. [name], NP.  Patient reports 82-monthhistory of intermittent right upper quadrant pain, associated with meals, nausea.  No vomiting.  Occurs within a few minutes of starting to eat.  No weight loss.  No prior history of similar symptoms.  Describes the pain as dull, nonradiating, 5/10, can last for an hour.  Patient was seen by PCP for the above, note reviewed and stated "gallbladder has heart and deposits of fluid."  That could be causing her pain and recommended referral to gastroenterology.  Right upper quadrant ultrasound showed cholelithiasis without acute cholecystitis.  Fatty liver.  Most recent labs show normal liver enzymes on CMP, however patient has had chronically elevated transaminases prior to this with AST ALT of 49 and 34 in June 2022.  Normal total bilirubin and alk phos chronically  Past Medical History:  Diagnosis Date   Chronic kidney disease    Diabetes mellitus without complication (HDatto    Extreme obesity    Fatty liver    Hyperlipidemia    Hypertension    Radiculopathy, cervical    Renal insufficiency    pt states she has chronic kidney disease.     Past Surgical History:  Procedure Laterality Date   ABDOMINAL HYSTERECTOMY     JOINT REPLACEMENT     SPINE SURGERY      Prior to Admission medications   Medication Sig Start Date End Date Taking?  Authorizing Provider  acetaminophen (TYLENOL) 500 MG tablet Take 500 mg by mouth as needed.   Yes [provider]  benazepril (LOTENSIN) 40 MG tablet TAKE 1 TABLET BY MOUTH EVERY DAY 05/23/20  Yes Cannady, Jolene T, NP  cyclobenzaprine (FLEXERIL) 5 MG tablet Take 1 tablet (5 mg total) by mouth 3 (three) times daily as needed for muscle spasms. 10/16/20  Yes HJon Billings NP  dapagliflozin propanediol (FARXIGA) 10 MG TABS tablet Take 1 tablet (10 mg total) by mouth daily before breakfast. 06/27/20  Yes HJon Billings NP  glipiZIDE (GLUCOTROL) 5 MG tablet Take 1 tablet (5 mg total) by mouth 2 (two) times daily before a meal. 08/06/20  Yes HJon Billings NP  glucose blood (ONETOUCH ULTRA) test strip 1 each by Other route as needed for other. Use as instructed 03/07/20  Yes Cannady, Jolene T, NP  lovastatin (MEVACOR) 40 MG tablet TAKE 1 TABLET BY MOUTH EVERYDAY AT BEDTIME 08/06/20  Yes HJon Billings NP  metoprolol succinate (TOPROL-XL) 50 MG 24 hr tablet Take 1 tablet (50 mg total) by mouth daily. Take with or immediately following a meal. 08/06/20  Yes HJon Billings NP  pantoprazole (PROTONIX) 20 MG tablet Take 1 tablet (20 mg total) by mouth daily. 12/27/20  Yes HJon Billings NP  pregabalin (LYRICA) 150 MG capsule Take 1 capsule (150 mg total) by mouth 3 (three) times  daily. 05/17/20  Yes [name], NP  traMADol (ULTRAM) 50 MG tablet Take 1 tablet (50 mg total) by mouth every 8 (eight) hours as needed. 01/13/21  Yes [name], NP  traZODone (DESYREL) 100 MG tablet Take 1 tablet (100 mg total) by mouth at bedtime as needed for sleep. 01/13/21  Yes [name], NP    Family History  Problem Relation Age of Onset   Diabetes Mother    Heart disease Mother    Hypertension Mother    Heart disease Father    Cancer Brother    Hypertension Brother    Breast cancer Maternal Aunt    Breast cancer Paternal Aunt      Social History   Tobacco Use   Smoking  status: Never   Smokeless tobacco: Never  Vaping Use   Vaping Use: Never used  Substance Use Topics   Alcohol use: No   Drug use: No    Allergies as of 01/20/2021 - Review Complete 01/20/2021  Allergen Reaction Noted   Actos [pioglitazone]  10/02/2014   Metformin and related Diarrhea 10/02/2014   Victoza [liraglutide] Nausea And Vomiting 10/02/2014    Review of Systems:    All systems reviewed and negative except where noted in HPI.   Physical Exam:  Constitutional: General:   Alert,  Well-developed, well-nourished, pleasant and cooperative in NAD There were no vitals taken for this visit.  Eyes:  Sclera clear, no icterus.   Conjunctiva pink. PERRLA  Ears:  No scars, lesions or masses, Normal auditory acuity. Nose:  No deformity, discharge, or lesions. Mouth:  No deformity or lesions, oropharynx pink & moist.  Neck:  Supple; no masses or thyromegaly.  Respiratory: Normal respiratory effort, Normal percussion  Gastrointestinal: Soft, tender to palpation epigastric region and right upper quadrant, and non-distended without masses, hepatosplenomegaly or hernias noted.  No guarding or rebound tenderness.     Cardiac: No clubbing or edema.  No cyanosis. Normal posterior tibial pedal pulses noted.  Lymphatic:  No significant cervical or axillary adenopathy.  Psych:  Alert and cooperative. Normal mood and affect.  Musculoskeletal:  Normal gait. Head normocephalic, atraumatic. Symmetrical without gross deformities. 5/5 Upper and Lower extremity strength bilaterally.  Skin: Warm. Intact without significant lesions or rashes. No jaundice.  Neurologic:  Face symmetrical, tongue midline, Normal sensation to touch;  grossly normal neurologically.  Psych:  Alert and oriented x3, Alert and cooperative. Normal mood and affect.   Labs: CBC    Component Value Date/Time   WBC 5.8 05/17/2020 1337   WBC 5.4 02/20/2020 1548   RBC 4.63 05/17/2020 1337   RBC 4.20 02/20/2020 1548    HGB 13.6 05/17/2020 1337   HCT 42.0 05/17/2020 1337   PLT 179 05/17/2020 1337   MCV 91 05/17/2020 1337   MCH 29.4 05/17/2020 1337   MCH 30.7 02/20/2020 1548   MCHC 32.4 05/17/2020 1337   MCHC 33.2 02/20/2020 1548   RDW 12.4 05/17/2020 1337   LYMPHSABS 1.4 05/17/2020 1337   MONOABS 0.4 02/20/2020 1548   EOSABS 0.1 05/17/2020 1337   BASOSABS 0.0 05/17/2020 1337   CMP     Component Value Date/Time   NA 142 12/27/2020 1358   K 5.0 12/27/2020 1358   CL 102 12/27/2020 1358   CO2 21 12/27/2020 1358   GLUCOSE 181 (H) 12/27/2020 1358   GLUCOSE 160 (H) 02/20/2020 1548   BUN 17 12/27/2020 1358   CREATININE 1.55 (H) 12/27/2020 1358   CALCIUM 9.4 12/27/2020 1358  PROT 7.5 12/27/2020 1358   ALBUMIN 4.5 12/27/2020 1358   AST 35 12/27/2020 1358   AST 59 (H) 03/21/2018 1332   ALT 23 12/27/2020 1358   ALT 45 03/21/2018 1332   ALKPHOS 66 12/27/2020 1358   BILITOT 0.4 12/27/2020 1358   GFRNONAA 48 (L) 05/17/2020 1337   GFRNONAA 42 (L) 02/20/2020 1548   GFRAA 55 (L) 05/17/2020 1337    Imaging Studies: Right upper quadrant ultrasound October 2022  IMPRESSION: 1. Cholelithiasis without sonographic evidence of acute cholecystitis. 2. Fatty liver.  Assessment and Plan:   Anna Juarez is a 70 y.o. y/o female has been referred for right upper quadrant pain  Patient's clinical symptoms Are most consistent with biliary colic given that they occur associated with meals, and associated with nausea  Hello refer patient to surgery at this time.  Urgent referral has been placed in our clinic staff will try to reach out to them if an appointment has not been made within the next 7 to 10 days  Patient advised to call us if she does not hear from them about an appointment as well within the next 1 to 2 weeks  I have also reached out to PCP, [name], NP, as patient reports having chest pain and shortness of breath when the pain in her abdomen occurs.  She states she had discussed  this with her PCP already and they had recommended continuing with GI work-up for now.  I requested that PCP see her for possible EKG or cardiology referral.  They have replied back to me and plan on getting the patient in to their clinic this week for work-up of this.  If chest pain worsens or changes, patient advised to go to the ER or call PCP as well.  I suspect that surgery may need cardiology work-up and this cleared prior to any surgical interventions as well  In the meantime I will repeat liver enzymes to ensure the bilirubin and alkaline phosphatase are normal and there is no evidence of CBD obstruction.  And we will also obtain fatty liver work-up  Finding of fatty liver on imaging discussed with patient Diet, weight loss, and exercise encouraged along with avoiding hepatotoxic drugs including alcohol Risk of progression to cirrhosis if above measures are not instituted were discussed as well, and patient verbalized understanding     Dr Anna Juarez  Speech recognition software was used to dictate the above note.

## 2021-01-20 NOTE — Telephone Encounter (Signed)
Referral placed for general surgery

## 2021-01-20 NOTE — Telephone Encounter (Signed)
Requested medication (s) are due for refill today: yes  Requested medication (s) are on the active medication list: yes  Last refill:  05/17/20 #270 1 RF  Future visit scheduled: yes  Notes to clinic:  med not delegated to NT to RF   Requested Prescriptions  Pending Prescriptions Disp Refills   pregabalin (LYRICA) 150 MG capsule [Pharmacy Med Name: pregabalin 150 mg capsule] 270 capsule 1    Sig: TAKE ONE CAPSULE BY [place]     Not Delegated - Neurology:  Anticonvulsants - Controlled Failed - 01/20/2021  8:00 AM      Failed - This refill cannot be delegated      Passed - Valid encounter within last 12 months    Recent Outpatient Visits           1 week ago Chronic right-sided low back pain without sciatica   [place], NP   3 weeks ago Hypertension associated with diabetes [facility])   [facility] [name], NP   3 months ago Herpes zoster without complication   [place], NP   3 months ago Herpes zoster without complication   [place], [place], MD   3 months ago Painful urination   [place], Lauren A, NP       Future Appointments             In 2 months [name], NP [facility], [place]   In 6 months  [employer], [place]

## 2021-01-20 NOTE — Patient Instructions (Signed)
We will be placing a referral to [facility] for evaluation.   If you have not heard anything from them by February 03, 2021, please call their office at [phone]

## 2021-01-21 ENCOUNTER — Inpatient Hospital Stay: Payer: Medicare PPO

## 2021-01-21 ENCOUNTER — Inpatient Hospital Stay: Payer: Medicare PPO | Attending: Internal Medicine | Admitting: Internal Medicine

## 2021-01-21 ENCOUNTER — Encounter: Payer: Self-pay | Admitting: General Surgery

## 2021-01-21 VITALS — BP 154/71 | HR 55 | Temp 95.6°F | Resp 18 | Wt 303.0 lb

## 2021-01-21 DIAGNOSIS — D472 Monoclonal gammopathy: Secondary | ICD-10-CM

## 2021-01-21 DIAGNOSIS — N183 Chronic kidney disease, stage 3 unspecified: Secondary | ICD-10-CM

## 2021-01-21 DIAGNOSIS — N1832 Chronic kidney disease, stage 3b: Secondary | ICD-10-CM | POA: Insufficient documentation

## 2021-01-21 LAB — IRON AND TIBC
Iron Saturation: 19 % (ref 15–55)
Iron: 61 ug/dL (ref 27–139)
Total Iron Binding Capacity: 329 ug/dL (ref 250–450)
UIBC: 268 ug/dL (ref 118–369)

## 2021-01-21 LAB — COMPREHENSIVE METABOLIC PANEL
ALT: 18 U/L (ref 0–44)
AST: 25 U/L (ref 15–41)
Albumin: 4 g/dL (ref 3.5–5.0)
Alkaline Phosphatase: 44 U/L (ref 38–126)
Anion gap: 9 (ref 5–15)
BUN: 17 mg/dL (ref 8–23)
CO2: 27 mmol/L (ref 22–32)
Calcium: 9.5 mg/dL (ref 8.9–10.3)
Chloride: 102 mmol/L (ref 98–111)
Creatinine, Ser: 1.4 mg/dL — ABNORMAL HIGH (ref 0.44–1.00)
GFR, Estimated: 40 mL/min — ABNORMAL LOW (ref >60)
Glucose, Bld: 134 mg/dL — ABNORMAL HIGH (ref 70–99)
Potassium: 3.9 mmol/L (ref 3.5–5.1)
Sodium: 138 mmol/L (ref 135–145)
Total Bilirubin: 0.8 mg/dL (ref 0.3–1.2)
Total Protein: 7.4 g/dL (ref 6.5–8.1)

## 2021-01-21 LAB — CBC WITH DIFFERENTIAL/PLATELET
Abs Immature Granulocytes: 0.04 10*3/uL (ref 0.00–0.07)
Basophils Absolute: 0 10*3/uL (ref 0.0–0.1)
Basophils Relative: 1 %
Eosinophils Absolute: 0.1 10*3/uL (ref 0.0–0.5)
Eosinophils Relative: 2 %
HCT: 43.3 % (ref 36.0–46.0)
Hemoglobin: 14.2 g/dL (ref 12.0–15.0)
Immature Granulocytes: 1 %
Lymphocytes Relative: 30 %
Lymphs Abs: 1.7 10*3/uL (ref 0.7–4.0)
MCH: 30.8 pg (ref 26.0–34.0)
MCHC: 32.8 g/dL (ref 30.0–36.0)
MCV: 93.9 fL (ref 80.0–100.0)
Monocytes Absolute: 0.5 10*3/uL (ref 0.1–1.0)
Monocytes Relative: 9 %
Neutro Abs: 3.3 10*3/uL (ref 1.7–7.7)
Neutrophils Relative %: 57 %
Platelets: 143 10*3/uL — ABNORMAL LOW (ref 150–400)
RBC: 4.61 MIL/uL (ref 3.87–5.11)
RDW: 13.3 % (ref 11.5–15.5)
WBC: 5.8 10*3/uL (ref 4.0–10.5)
nRBC: 0 % (ref 0.0–0.2)

## 2021-01-21 NOTE — Assessment & Plan Note (Addendum)
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  I reviewed the natural history of MGUS; low risk of transformation to multiple myeloma-however see discussion below regarding hip pain.   # Stage 3b chronic kidney disease- GFR 40- stable.  # Poorly controlled DM- HbA1c- 7.4-recommend close monitoring of blood sugars/compliance with medications.  # Hip pain- awaiting hip MRI w/o contrast [PCP]- on 10/26- pending.   # DISPOSITION: will  # labs today- cbc/cmp; MM panel; K/L light chains-  # follow up in 12 months- MD; labs- cbc/cmp;MM panel; K/l light chains-Dr.B  Addendum: 10/26-MRI hip shows no evidence or concerns of myelomatous involvement; more consistent with osteoarthritis.

## 2021-01-21 NOTE — Progress Notes (Signed)
Patient states that she has issues with her gallbladder that cause abdominal pain, trouble sleeping, right hip pain, little appetite. She states that her heart "skips" and that when she has trouble with her gallbladder, her heart skips more.

## 2021-01-21 NOTE — Progress Notes (Signed)
[facility] NOTE  Patient Care Team: [name], NP as PCP - General Lucilla Lame, MD as Consulting Physician (Gastroenterology) [name], MD as Consulting Physician (Orthopedic Surgery) [name], [facility] (Pharmacist) [name], [facility] (Pharmacist)  CHIEF COMPLAINTS/PURPOSE OF CONSULTATION: MGUS  # MGUS IgA October 2022 M protein 0.2 g/dL  #Stage III CKD/diabetes/osteoarthritis  HISTORY OF PRESENTING ILLNESS: Patient is alone.  Ambulating independently. Anna Juarez 70 y.o.  female history of monoclonal gammopathy of unknown significance is here for follow-up.  Patient history of chronic back pain joint pains.  However recently noted to have worsening right hip pain.  She is currently awaiting evaluation with a hip MRI next week.  Denies any tingling or numbness.  Denies any weight loss.  Patient also has chronic abdominal pain not any worse.  Chronic fatigue.  Review of Systems  Constitutional:  Positive for malaise/fatigue. Negative for chills, diaphoresis, fever and weight loss.  HENT:  Negative for nosebleeds and sore throat.   Eyes:  Negative for double vision.  Respiratory:  Negative for cough, hemoptysis, sputum production, shortness of breath and wheezing.   Cardiovascular:  Negative for chest pain, palpitations, orthopnea and leg swelling.  Gastrointestinal:  Positive for abdominal pain. Negative for blood in stool, constipation, diarrhea, heartburn, melena, nausea and vomiting.  Genitourinary:  Negative for dysuria, frequency and urgency.  Musculoskeletal:  Positive for back pain and joint pain.  Skin: Negative.  Negative for itching and rash.  Neurological:  Negative for dizziness, tingling, focal weakness, weakness and headaches.  Endo/Heme/Allergies:  Does not bruise/bleed easily.  Psychiatric/Behavioral:  Negative for depression. The patient is not nervous/anxious and does not have insomnia.     MEDICAL HISTORY:  Past  Medical History:  Diagnosis Date   Arthritis    Chronic kidney disease    Complication of anesthesia    Diabetes mellitus without complication (HCC)    Extreme obesity    Fatty liver    GERD (gastroesophageal reflux disease)    Hyperlipidemia    Hypertension    Palpitations    PONV (postoperative nausea and vomiting)    Radiculopathy, cervical    Renal insufficiency    stage 3   Sleep apnea    uses cpap   Tear of acetabular labrum 01/2021   right    SURGICAL HISTORY: Past Surgical History:  Procedure Laterality Date   ABDOMINAL HYSTERECTOMY     CERVICAL FUSION     x2   COLONOSCOPY     JOINT REPLACEMENT Bilateral    knees    SOCIAL HISTORY: Social History   Socioeconomic History   Marital status: Married    Spouse name: Not on file   Number of children: Not on file   Years of education: Not on file   Highest education level: 12th grade  Occupational History   Occupation: retired   Tobacco Use   Smoking status: Never   Smokeless tobacco: Never  Vaping Use   Vaping Use: Never used  Substance and Sexual Activity   Alcohol use: No   Drug use: No   Sexual activity: Not on file  Other Topics Concern   Not on file  Social History Narrative   [place]; lives with husband; never smoker; no alcohol; retd. Manufacturing.    Social Determinants of Health   Financial Resource Strain: Low Risk    Difficulty of Paying Living Expenses: Not hard at all  Food Insecurity: No Food Insecurity  Worried About Charity fundraiser in the Last Year: Never true   [place] in the Last Year: Never true  Transportation Needs: No Transportation Needs   Lack of Transportation (Medical): No   Lack of Transportation (Non-Medical): No  Physical Activity: Inactive   Days of Exercise per Week: 0 days   Minutes of Exercise per Session: 0 min  Stress: No Stress Concern Present   Feeling of Stress : Not at all  Social Connections: Not on file  Intimate Partner  Violence: Not on file    FAMILY HISTORY: Family History  Problem Relation Age of Onset   Diabetes Mother    Heart disease Mother    Hypertension Mother    Heart disease Father    Cancer Brother    Hypertension Brother    Breast cancer Maternal Aunt    Breast cancer Paternal Aunt     ALLERGIES:  is allergic to actos [pioglitazone], metformin and related, scopolamine, and victoza [liraglutide].  MEDICATIONS:  Current Outpatient Medications  Medication Sig Dispense Refill   acetaminophen (TYLENOL) 500 MG tablet Take 1,000 mg by mouth daily.     dapagliflozin propanediol (FARXIGA) 10 MG TABS tablet Take 1 tablet (10 mg total) by mouth daily before breakfast. 90 tablet 1   glipiZIDE (GLUCOTROL) 5 MG tablet Take 1 tablet (5 mg total) by mouth 2 (two) times daily before a meal. 180 tablet 1   lovastatin (MEVACOR) 40 MG tablet TAKE 1 TABLET BY MOUTH EVERYDAY AT BEDTIME 90 tablet 1   metoprolol succinate (TOPROL-XL) 50 MG 24 hr tablet Take 1 tablet (50 mg total) by mouth daily. Take with or immediately following a meal. (Patient taking differently: Take 50 mg by mouth every morning. Take with or immediately following a meal.) 90 tablet 1   pregabalin (LYRICA) 150 MG capsule TAKE ONE CAPSULE BY MOUTH THREE TIMES DAILY 270 capsule 1   traMADol (ULTRAM) 50 MG tablet Take 1 tablet (50 mg total) by mouth every 8 (eight) hours as needed. 30 tablet 1   acetaminophen (TYLENOL) 500 MG tablet Take 2 tablets (1,000 mg total) by mouth every 6 (six) hours as needed for mild pain.     benazepril (LOTENSIN) 40 MG tablet Take 1 tablet (40 mg total) by mouth daily. (Patient taking differently: Take 40 mg by mouth every morning.) 90 tablet 1   Blood Glucose Monitoring Suppl (ACCU-CHEK GUIDE) w/Device KIT Use daily to check glucose level. 1 kit 0   glucose blood test strip Use as instructed 100 each 12   Lactobacillus (ACIDOPHILUS) CAPS capsule Take 1 capsule by mouth daily. 175 mg     oxyCODONE (OXY  IR/ROXICODONE) 5 MG immediate release tablet Take 1 tablet (5 mg total) by mouth every 4 (four) hours as needed for severe pain. (Patient not taking: Reported on 02/19/2021) 30 tablet 0   pantoprazole (PROTONIX) 20 MG tablet Take 1 tablet (20 mg total) by mouth daily. (Patient taking differently: Take 20 mg by mouth every morning.) 90 tablet 1   traZODone (DESYREL) 100 MG tablet Take 1 tablet (100 mg total) by mouth at bedtime as needed for sleep. (Patient taking differently: Take 100 mg by mouth at bedtime.) 90 tablet 1   traZODone (DESYREL) 50 MG tablet TAKE 0.5-1 TABLETS BY MOUTH AT BEDTIME AS NEEDED FOR SLEEP. 30 tablet 0   No current facility-administered medications for this visit.      Marland Kitchen  PHYSICAL EXAMINATION:  Vitals:   01/21/21 1524  BP: Marland Kitchen)  154/71  Pulse: (!) 55  Resp: 18  Temp: (!) 95.6 F (35.3 C)  SpO2: 98%   Filed Weights   01/21/21 1524  Weight: (!) 303 lb (137.4 kg)    Physical Exam Vitals and nursing note reviewed.  HENT:     Head: Normocephalic and atraumatic.     Mouth/Throat:     Pharynx: Oropharynx is clear.  Eyes:     Extraocular Movements: Extraocular movements intact.     Pupils: Pupils are equal, round, and reactive to light.  Cardiovascular:     Rate and Rhythm: Normal rate and regular rhythm.  Pulmonary:     Comments: Decreased breath sounds bilaterally.  Abdominal:     Palpations: Abdomen is soft.  Musculoskeletal:        General: Normal range of motion.     Cervical back: Normal range of motion.  Skin:    General: Skin is warm.  Neurological:     General: No focal deficit present.     Mental Status: She is alert and oriented to person, place, and time.  Psychiatric:        Behavior: Behavior normal.        Judgment: Judgment normal.     LABORATORY DATA:  I have reviewed the data as listed Lab Results  Component Value Date   WBC 6.5 01/24/2021   HGB 13.8 01/24/2021   HCT 40.9 01/24/2021   MCV 91 01/24/2021   PLT 156  01/24/2021   Recent Labs    05/17/20 1337 09/25/20 1331 12/27/20 1358 01/21/21 1617 01/24/21 1414  NA 142   < > 142 138 143  K 4.4   < > 5.0 3.9 4.1  CL 99   < > 102 102 103  CO2 23   < > '21 27 23  ' GLUCOSE 122*   < > 181* 134* 131*  BUN 17   < > '17 17 18  ' CREATININE 1.15*   < > 1.55* 1.40* 1.36*  CALCIUM 9.4   < > 9.4 9.5 9.5  GFRNONAA 48*  --   --  40*  --   GFRAA 55*  --   --   --   --   PROT 7.5   < > 7.5 7.4 7.1  ALBUMIN 4.5   < > 4.5 4.0 4.4  AST 94*   < > 35 25 26  ALT 49*   < > '23 18 16  ' ALKPHOS 64   < > 66 44 55  BILITOT 0.6   < > 0.4 0.8 0.4   < > = values in this interval not displayed.    RADIOGRAPHIC STUDIES: I have personally reviewed the radiological images as listed and agreed with the findings in the report. MR HIP RIGHT WO CONTRAST  Result Date: 01/31/2021 CLINICAL DATA:  Chronic, progressively worsening right hip pain over the past 2 years. EXAM: MR OF THE RIGHT HIP WITHOUT CONTRAST TECHNIQUE: Multiplanar, multisequence MR imaging was performed. No intravenous contrast was administered. COMPARISON:  Right hip x-rays dated December 27, 2020. FINDINGS: Bones: There is no evidence of acute fracture, dislocation or avascular necrosis. No focal bone lesion. Degenerative changes of the sacroiliac joints and pubic symphysis. Articular cartilage and labrum Articular cartilage: Partial-thickness cartilage loss in both anterior superior hip joint with subchondral marrow edema and cystic change in the acetabula. Labrum: Degenerative tearing of the right anterior superior labrum. No paralabral abnormality. Joint or bursal effusion Joint effusion: No significant hip joint effusion. Bursae: No focal  periarticular fluid collection. Muscles and tendons Muscles and tendons: The visualized gluteus, hamstring and iliopsoas tendons appear normal. No muscle edema or atrophy. Other findings Miscellaneous: Prior hysterectomy.  Sigmoid colonic diverticulosis. IMPRESSION: 1. Mild bilateral  hip osteoarthritis. 2. Degenerative tearing of the right anterior superior labrum. Electronically Signed   By: Titus Dubin M.D.   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  I reviewed the natural history of MGUS; low risk of transformation to multiple myeloma-however see discussion below regarding hip pain.   # Stage 3b chronic kidney disease- GFR 40- stable.  # Poorly controlled DM- HbA1c- 7.4-recommend close monitoring of blood sugars/compliance with medications.  # Hip pain- awaiting hip MRI w/o contrast [PCP]- on 10/26- pending.    # DISPOSITION: will  # labs today- cbc/cmp; MM panel; K/L light chains-  # follow up in 12 months- MD; labs- cbc/cmp;MM panel; K/l light chains-Dr.B  Addendum: 10/26-MRI hip shows no evidence or concerns of myelomatous involvement; more consistent with osteoarthritis.  All questions were answered. The patient knows to call the clinic with any problems, questions or concerns.    Cammie Sickle, MD 02/25/2021 9:33 AM

## 2021-01-22 ENCOUNTER — Telehealth: Payer: Self-pay | Admitting: Nurse Practitioner

## 2021-01-22 LAB — KAPPA/LAMBDA LIGHT CHAINS
Kappa free light chain: 23.2 mg/L — ABNORMAL HIGH (ref 3.3–19.4)
Kappa, lambda light chain ratio: 0.46 (ref 0.26–1.65)
Lambda free light chains: 50 mg/L — ABNORMAL HIGH (ref 5.7–26.3)

## 2021-01-22 MED ORDER — ONETOUCH ULTRA VI STRP: ORAL_STRIP | 12 refills | Status: DC

## 2021-01-22 NOTE — Telephone Encounter (Signed)
Can we find out if patient has been contacted by general surgery to schedule an appt?   Also, can we bring her in for a pre op and EKG?

## 2021-01-22 NOTE — Addendum Note (Signed)
Addended by: Amado Coe on: 01/22/2021 04:20 PM   Modules accepted: Orders

## 2021-01-23 ENCOUNTER — Telehealth: Payer: Self-pay

## 2021-01-23 LAB — HEPATITIS A ANTIBODY, TOTAL: hep A Total Ab: NEGATIVE

## 2021-01-23 LAB — ANTI-MICROSOMAL ANTIBODY LIVER / KIDNEY: LKM1 Ab: 0.6 Units (ref 0.0–20.0)

## 2021-01-23 LAB — CERULOPLASMIN: Ceruloplasmin: 25.7 mg/dL (ref 19.0–39.0)

## 2021-01-23 LAB — HEPATITIS B CORE ANTIBODY, TOTAL: Hep B Core Total Ab: NEGATIVE

## 2021-01-23 LAB — HEPATITIS B SURFACE ANTIGEN: Hepatitis B Surface Ag: NEGATIVE

## 2021-01-23 LAB — IGG: IgG (Immunoglobin G), Serum: 877 mg/dL (ref 586–1602)

## 2021-01-23 LAB — MITOCHONDRIAL/SMOOTH MUSCLE AB PNL
Mitochondrial Ab: <20 Units (ref 0.0–20.0)
Smooth Muscle Ab: 11 Units (ref 0–19)

## 2021-01-23 LAB — HEPATITIS C ANTIBODY: Hep C Virus Ab: <0.1 s/co ratio (ref 0.0–0.9)

## 2021-01-23 LAB — FERRITIN: Ferritin: 55 ng/mL (ref 15–150)

## 2021-01-23 LAB — ANA: ANA Titer 1: NEGATIVE

## 2021-01-23 LAB — HEPATITIS B SURFACE ANTIBODY,QUALITATIVE: Hep B Surface Ab, Qual: NONREACTIVE

## 2021-01-23 NOTE — Chronic Care Management (AMB) (Signed)
    Chronic Care Management Pharmacy Assistant   Name: Anna Juarez  MRN: [id number] DOB: 1950/04/13  Reason for Encounter: Med Sync Call    Reviewed chart for medication changes ahead of medication coordination call.  No OVs, Consults, or hospital visits since last care coordination call/Pharmacist visit. (If appropriate, list visit date, provider name)  No medication changes indicated OR if recent visit, treatment plan here.  BP Readings from Last 3 Encounters:  01/21/21 (!) 154/71  01/13/21 132/76  12/27/20 129/73    Lab Results  Component Value Date   HGBA1C 7.9 (H) 12/27/2020     Patient obtains medications through Adherence Packaging  90 Days   Last adherence delivery included: (medication name and frequency) Metoprolol 50 mg 1 tablet once at breakfast Pregabalin 150 mg 1 tablet 3 times at breakfast, lunch, dinner Acidophilus 175 mg once at breakfast Nexium 40 mg once at breakfast Lovastatin 40 mg 1 tablet once at bedtime Glipizide 5 mg 1 tablet at breakfast and  1 tablet at evening meal Benazepril 40 mg 1 tablet once at breakfast   Patient declined (meds) last month due to PRN use/additional supply on hand. N/a  Explanation of abundance on hand (ie #30 due to overlapping fills or previous adherence issues etc) N/a  Patient is due for next adherence delivery on: 02/03/21. Called patient and reviewed medications and coordinated delivery.  This delivery to include: Metoprolol 50 mg 1 tablet once at breakfast Pregabalin 150 mg 1 tablet 3 times at breakfast, lunch, dinner Pantoprazole 20 mg 1 tablet at breatfast Trazodone 40 mg 1 tablet at bed time  Lovastatin 40 mg 1 tablet once at bedtime Glipizide 5 mg 1 tablet at breakfast and  1 tablet at evening meal Benazepril 40 mg 1 tablet once at breakfast    Patient declined the following medications (meds) due to (reason) Acidophilus - PCP took patient off of it and replaced it with pantoprazole  Nexium    Patient needs refills for N/a  Confirmed delivery date of 02/03/21, advised patient that pharmacy will contact them the morning of delivery.   Andee Poles, CMA

## 2021-01-24 ENCOUNTER — Other Ambulatory Visit: Payer: Self-pay

## 2021-01-24 ENCOUNTER — Ambulatory Visit: Payer: Medicare PPO | Admitting: Nurse Practitioner

## 2021-01-24 ENCOUNTER — Encounter: Payer: Self-pay | Admitting: Nurse Practitioner

## 2021-01-24 VITALS — BP 126/82 | HR 60 | Temp 98.7°F | Resp 18 | Wt 303.0 lb

## 2021-01-24 DIAGNOSIS — Z01818 Encounter for other preprocedural examination: Secondary | ICD-10-CM

## 2021-01-24 DIAGNOSIS — E1159 Type 2 diabetes mellitus with other circulatory complications: Secondary | ICD-10-CM

## 2021-01-24 DIAGNOSIS — E119 Type 2 diabetes mellitus without complications: Secondary | ICD-10-CM

## 2021-01-24 DIAGNOSIS — E1169 Type 2 diabetes mellitus with other specified complication: Secondary | ICD-10-CM

## 2021-01-24 DIAGNOSIS — N1832 Chronic kidney disease, stage 3b: Secondary | ICD-10-CM | POA: Diagnosis not present

## 2021-01-24 DIAGNOSIS — I152 Hypertension secondary to endocrine disorders: Secondary | ICD-10-CM

## 2021-01-24 DIAGNOSIS — E785 Hyperlipidemia, unspecified: Secondary | ICD-10-CM

## 2021-01-24 DIAGNOSIS — Z23 Encounter for immunization: Secondary | ICD-10-CM | POA: Diagnosis not present

## 2021-01-24 DIAGNOSIS — Z6841 Body Mass Index (BMI) 40.0 and over, adult: Secondary | ICD-10-CM

## 2021-01-24 LAB — MULTIPLE MYELOMA PANEL, SERUM
Albumin SerPl Elph-Mcnc: 3.8 g/dL (ref 2.9–4.4)
Albumin/Glob SerPl: 1.2 (ref 0.7–1.7)
Alpha 1: 0.2 g/dL (ref 0.0–0.4)
Alpha2 Glob SerPl Elph-Mcnc: 0.8 g/dL (ref 0.4–1.0)
B-Globulin SerPl Elph-Mcnc: 1.4 g/dL — ABNORMAL HIGH (ref 0.7–1.3)
Gamma Glob SerPl Elph-Mcnc: 0.7 g/dL (ref 0.4–1.8)
Globulin, Total: 3.2 g/dL (ref 2.2–3.9)
IgA: 510 mg/dL — ABNORMAL HIGH (ref 64–422)
IgG (Immunoglobin G), Serum: 827 mg/dL (ref 586–1602)
IgM (Immunoglobulin M), Srm: 58 mg/dL (ref 26–217)
M Protein SerPl Elph-Mcnc: 0.2 g/dL — ABNORMAL HIGH
Total Protein ELP: 7 g/dL (ref 6.0–8.5)

## 2021-01-24 LAB — URINALYSIS, ROUTINE W REFLEX MICROSCOPIC
Bilirubin, UA: NEGATIVE
Ketones, UA: NEGATIVE
Leukocytes,UA: NEGATIVE
Nitrite, UA: NEGATIVE
Protein,UA: NEGATIVE
RBC, UA: NEGATIVE
Specific Gravity, UA: 1.02 (ref 1.005–1.030)
Urobilinogen, Ur: 0.2 mg/dL (ref 0.2–1.0)
pH, UA: 5 (ref 5.0–7.5)

## 2021-01-24 NOTE — Progress Notes (Signed)
EKG showed Sinus Loletha Grayer. Results discussed with patient during visit.

## 2021-01-24 NOTE — Assessment & Plan Note (Signed)
Chronic.  Controlled.  Continue with current medication regimen.  Most recent Creatinine Labs ordered today.  Return to clinic in 6 months for reevaluation.  Call sooner if concerns arise.

## 2021-01-24 NOTE — Progress Notes (Signed)
BP 126/82 (BP Location: Left Arm)   Pulse 60   Temp 98.7 F (37.1 C)   Resp 18   Wt (!) 303 lb (137.4 kg)   SpO2 99%   BMI 55.42 kg/m    Subjective:    Patient ID: Anna Juarez, female    DOB: 1950/11/30, 70 y.o.   MRN: [id number]  HPI: Anna Juarez is a 70 y.o. female  Chief Complaint  Patient presents with   Pre-op Exam    Patient states she is here for Pre Operative visit. Patient states she would like to discuss if she needs to have her flu vaccine before having her scheduled surgery. Patient denies having any paperwork at today's visit.    01/27/2021  Assessment:  Preoperative evaluation and clearance show: No contraindications to planned surgery and patient is cleared for diagnosis for surgical procedure.  Other diagnoses affecting surgical risk include: Evaluation   Type of Surgery:  Intermediate, 1% - 5% cardiac risk (major intraabdominal, intrathoracic, orthopedic, major head & neck, prostatectomy)   Lee's Revised Cardiac Index: 0 Risk class I, very low, 0.4% risk of cardiac complications   Plan:  1. Patient requires endocarditis prophylaxis: no. 2. GENERAL PREOP INSTRUCTIONS: Proceed with surgery as planned. No food or liquids the morning of surgery. Call surgeon if develops respiratory illness, fever, or other illness. 3. Medications to Hold: ASA and NSAIDS for 7 days before surgery, unless instructed otherwise by surgeon. 4. EKG:  01/27/2021 : No acute ST changes noted. 5. Ordered Labs: CMP, CBC w/ diff, UA, and A1c.  From a medical standpoint the patient is an acceptable candidates to undergo general anesthesia for surgery. It is recommended to correct electrolytes and to keep the patient euvolemic and avoid significant fluid shifts during surgery.  Labs reviewed and pt is cleared for surgery. Pt denies a hx of adverse reactions to anesthesia.    Anna Juarez is a 70 y.o. female who presents to the office today for a preoperative  consultation at the request of Dr. Hampton Abbot, who will perform a  Cholecystectomy.  Current Complaints: RUQ abdominal pain  Past Medical History:  Diagnosis Date   Chronic kidney disease    Diabetes mellitus without complication ([place])    Extreme obesity    Fatty liver    Hyperlipidemia    Hypertension    Radiculopathy, cervical    Renal insufficiency    pt states she has chronic kidney disease.    Family History  Problem Relation Age of Onset   Diabetes Mother    Heart disease Mother    Hypertension Mother    Heart disease Father    Cancer Brother    Hypertension Brother    Breast cancer Maternal Aunt    Breast cancer Paternal Aunt    Current Outpatient Medications  Medication Sig Dispense Refill   acetaminophen (TYLENOL) 500 MG tablet Take 500 mg by mouth as needed.     benazepril (LOTENSIN) 40 MG tablet TAKE 1 TABLET BY MOUTH EVERY DAY 90 tablet 1   cyclobenzaprine (FLEXERIL) 5 MG tablet Take 1 tablet (5 mg total) by mouth 3 (three) times daily as needed for muscle spasms. 30 tablet 1   dapagliflozin propanediol (FARXIGA) 10 MG TABS tablet Take 1 tablet (10 mg total) by mouth daily before breakfast. 90 tablet 1   glipiZIDE (GLUCOTROL) 5 MG tablet Take 1 tablet (5 mg total) by mouth 2 (two) times daily before a meal. 180 tablet 1   glucose  blood (ONETOUCH ULTRA) test strip Use as instructed 100 each 12   lovastatin (MEVACOR) 40 MG tablet TAKE 1 TABLET BY MOUTH EVERYDAY AT BEDTIME 90 tablet 1   metoprolol succinate (TOPROL-XL) 50 MG 24 hr tablet Take 1 tablet (50 mg total) by mouth daily. Take with or immediately following a meal. 90 tablet 1   pantoprazole (PROTONIX) 20 MG tablet Take 1 tablet (20 mg total) by mouth daily. 90 tablet 1   pregabalin (LYRICA) 150 MG capsule TAKE ONE CAPSULE BY MOUTH THREE TIMES DAILY 270 capsule 1   traMADol (ULTRAM) 50 MG tablet Take 1 tablet (50 mg total) by mouth every 8 (eight) hours as needed. 30 tablet 1   traZODone (DESYREL) 100 MG tablet  Take 1 tablet (100 mg total) by mouth at bedtime as needed for sleep. 90 tablet 1   No current facility-administered medications for this visit.   Allergies  Allergen Reactions   Actos [Pioglitazone]    Metformin And Related Diarrhea   Victoza [Liraglutide] Nausea And Vomiting   Social History   Socioeconomic History   Marital status: Married    Spouse name: Not on file   Number of children: Not on file   Years of education: Not on file   Highest education level: 12th grade  Occupational History   Occupation: retired   Tobacco Use   Smoking status: Never   Smokeless tobacco: Never  Vaping Use   Vaping Use: Never used  Substance and Sexual Activity   Alcohol use: No   Drug use: No   Sexual activity: Not on file  Other Topics Concern   Not on file  Social History Narrative   [place]; lives with husband; never smoker; no alcohol; retd. Manufacturing.    Social Determinants of Health   Financial Resource Strain: Low Risk    Difficulty of Paying Living Expenses: Not hard at all  Food Insecurity: No Food Insecurity   Worried About Charity fundraiser in the Last Year: Never true   [place] in the Last Year: Never true  Transportation Needs: No Transportation Needs   Lack of Transportation (Medical): No   Lack of Transportation (Non-Medical): No  Physical Activity: Inactive   Days of Exercise per Week: 0 days   Minutes of Exercise per Session: 0 min  Stress: No Stress Concern Present   Feeling of Stress : Not at all  Social Connections: Not on file  Intimate Partner Violence: Not on file     Preoperative Risk Factors  1. Major predictors that require intensive management and may lead to delay in or cancellation of the operative procedure unless emergent:  No Unstable coronary syndromes including unstable or severe angina or recent MI  No Decompensated heart failure including NYHA functional class 4 or worsening or new onset HF  No  Significant arrhythmia including high grade AV block, symptomatic ventricular arrhythmias, supraventricular arrhythmias with a ventricular rate >100 bpm at rest, symptomatic bradycardia, and newly recognized ventricular tachycardia  No Severe heart valve disease including severe aortic stenosis or symptomatic mitral stenosis  2. Additional independent predictors of major cardiac complications:  No Hgh risk type of surgery (Vascular surgery and any open intraperitoneal or intrathoracic procedures)  Other clinical predictors that warrant careful assessment of current status  No History of ischemic heart disease No History of CVA No  History of compensated heart failure or prior heart failure No Diabetes mellitus on Insulin yes Renal insufficiency  3. Perioperative  cardiac and long term risk is increased in pts unable to meet a 4-MET demand during most normal daily activities:  Yes Ability to climb 2 flights of stairs or walk four blocks  Objective:  BP 126/82 (BP Location: Left Arm)   Pulse 60   Temp 98.7 F (37.1 C)   Resp 18   Wt (!) 303 lb (137.4 kg)   SpO2 99%   BMI 55.42 kg/m   Body mass index is 55.42 kg/m.     [name] 01/27/21  Relevant past medical, surgical, family and social history reviewed and updated as indicated. Interim medical history since our last visit reviewed. Allergies and medications reviewed and updated.  Review of Systems  Gastrointestinal:  Positive for abdominal pain.   Per HPI unless specifically indicated above     Objective:    BP 126/82 (BP Location: Left Arm)   Pulse 60   Temp 98.7 F (37.1 C)   Resp 18   Wt (!) 303 lb (137.4 kg)   SpO2 99%   BMI 55.42 kg/m   Wt Readings from Last 3 Encounters:  01/24/21 (!) 303 lb (137.4 kg)  01/21/21 (!) 303 lb (137.4 kg)  01/13/21 (!) 301 lb (136.5 kg)    Physical Exam Vitals and nursing note reviewed.  Constitutional:      General: She is not in acute distress.     Appearance: Normal appearance. She is normal weight. She is not ill-appearing, toxic-appearing or diaphoretic.  HENT:     Head: Normocephalic.     Right Ear: Tympanic membrane and external ear normal.     Left Ear: Tympanic membrane and external ear normal.     Nose: Nose normal.     Mouth/Throat:     Mouth: Mucous membranes are moist.     Pharynx: Oropharynx is clear.  Eyes:     General:        Right eye: No discharge.        Left eye: No discharge.     Extraocular Movements: Extraocular movements intact.     Conjunctiva/sclera: Conjunctivae normal.     Pupils: Pupils are equal, round, and reactive to light.  Cardiovascular:     Rate and Rhythm: Normal rate and regular rhythm.     Heart sounds: No murmur heard. Pulmonary:     Effort: Pulmonary effort is normal. No respiratory distress.     Breath sounds: Normal breath sounds. No wheezing or rales.  Abdominal:     Tenderness: There is abdominal tenderness.  Musculoskeletal:     Cervical back: Normal range of motion and neck supple.  Skin:    General: Skin is warm and dry.     Capillary Refill: Capillary refill takes less than 2 seconds.  Neurological:     General: No focal deficit present.     Mental Status: She is alert and oriented to person, place, and time. Mental status is at baseline.  Psychiatric:        Mood and Affect: Mood normal.        Behavior: Behavior normal.        Thought Content: Thought content normal.        Judgment: Judgment normal.    Results for orders placed or performed in visit on 01/24/21  Comp Met (CMET)  Result Value Ref Range   Glucose 131 (H) 70 - 99 mg/dL   BUN 18 8 - 27 mg/dL   Creatinine, Ser 1.36 (H) 0.57 - 1.00 mg/dL  eGFR 41 (L) >59 mL/min/1.73   BUN/Creatinine Ratio 13 12 - 28   Sodium 143 134 - 144 mmol/L   Potassium 4.1 3.5 - 5.2 mmol/L   Chloride 103 96 - 106 mmol/L   CO2 23 20 - 29 mmol/L   Calcium 9.5 8.7 - 10.3 mg/dL   Total Protein 7.1 6.0 - 8.5 g/dL   Albumin 4.4 3.7  - 4.7 g/dL   Globulin, Total 2.7 1.5 - 4.5 g/dL   Albumin/Globulin Ratio 1.6 1.2 - 2.2   Bilirubin Total 0.4 0.0 - 1.2 mg/dL   Alkaline Phosphatase 55 44 - 121 IU/L   AST 26 0 - 40 IU/L   ALT 16 0 - 32 IU/L  CBC w/Diff  Result Value Ref Range   WBC 6.5 3.4 - 10.8 x10E3/uL   RBC 4.51 3.77 - 5.28 x10E6/uL   Hemoglobin 13.8 11.1 - 15.9 g/dL   Hematocrit 40.9 34.0 - 46.6 %   MCV 91 79 - 97 fL   MCH 30.6 26.6 - 33.0 pg   MCHC 33.7 31.5 - 35.7 g/dL   RDW 13.4 11.7 - 15.4 %   Platelets 156 150 - 450 x10E3/uL   Neutrophils 64 Not Estab. %   Lymphs 24 Not Estab. %   Monocytes 9 Not Estab. %   Eos 2 Not Estab. %   Basos 1 Not Estab. %   Neutrophils Absolute 4.2 1.4 - 7.0 x10E3/uL   Lymphocytes Absolute 1.6 0.7 - 3.1 x10E3/uL   Monocytes Absolute 0.6 0.1 - 0.9 x10E3/uL   EOS (ABSOLUTE) 0.1 0.0 - 0.4 x10E3/uL   Basophils Absolute 0.0 0.0 - 0.2 x10E3/uL   Immature Granulocytes 0 Not Estab. %   Immature Grans (Abs) 0.0 0.0 - 0.1 x10E3/uL  Urinalysis, Routine w reflex microscopic  Result Value Ref Range   Specific Gravity, UA 1.020 1.005 - 1.030   pH, UA 5.0 5.0 - 7.5   Color, UA Yellow Yellow   Appearance Ur Clear Clear   Leukocytes,UA Negative Negative   Protein,UA Negative Negative/Trace   Glucose, UA 3+ (A) Negative   Ketones, UA Negative Negative   RBC, UA Negative Negative   Bilirubin, UA Negative Negative   Urobilinogen, Ur 0.2 0.2 - 1.0 mg/dL   Nitrite, UA Negative Negative  HgB A1c  Result Value Ref Range   Hgb A1c MFr Bld 7.4 (H) 4.8 - 5.6 %   Est. average glucose Bld gHb Est-mCnc 166 mg/dL      Assessment & Plan:   Problem List Items Addressed This Visit       Cardiovascular and Mediastinum   Hypertension associated with diabetes (HCC)    Chronic.  Controlled.  Continue with current medication regimen.  Labs ordered today.  Return to clinic in 6 months for reevaluation.  Call sooner if concerns arise.        Relevant Orders   Comp Met (CMET) (Completed)    CBC w/Diff (Completed)   Urinalysis, Routine w reflex microscopic (Completed)   HgB A1c (Completed)     Endocrine   Hyperlipidemia associated with type 2 diabetes mellitus (HCC)    Chronic.  Controlled.  Continue with current medication regimen.  Labs ordered today.  Return to clinic in 6 months for reevaluation.  Call sooner if concerns arise.        Relevant Orders   Comp Met (CMET) (Completed)   CBC w/Diff (Completed)   Urinalysis, Routine w reflex microscopic (Completed)   HgB A1c (Completed)   Diabetes mellitus  without complication (HCC)    Chronic.  Controlled.  A1c is 7.4.  Continue with current medication regimen.  Labs ordered today.  Return to clinic in 6 months for reevaluation.  Call sooner if concerns arise.          Genitourinary   CKD (chronic kidney disease) stage 3, GFR 30-59 ml/min (HCC)    Chronic.  Controlled.  Continue with current medication regimen.  Most recent Creatinine Labs ordered today.  Return to clinic in 6 months for reevaluation.  Call sooner if concerns arise.        Relevant Orders   Comp Met (CMET) (Completed)   CBC w/Diff (Completed)   Urinalysis, Routine w reflex microscopic (Completed)   HgB A1c (Completed)     Other   BMI 50.0-59.9, adult ([place])   Other Visit Diagnoses     Pre-op exam    -  Primary   Cleared for surgery pending lab work. EKG shows Sinus Loletha Grayer. CMP, CBC, A1c and UA ordered during visit today.   Relevant Orders   Comp Met (CMET) (Completed)   CBC w/Diff (Completed)   Urinalysis, Routine w reflex microscopic (Completed)   HgB A1c (Completed)   EKG 12-Lead (Completed)   Flu vaccine need       Relevant Orders   Flu Vaccine QUAD High Dose(Fluad) (Completed)        Follow up plan: Return if symptoms worsen or fail to improve.

## 2021-01-24 NOTE — Assessment & Plan Note (Signed)
Chronic.  Controlled.  Continue with current medication regimen.  Labs ordered today.  Return to clinic in 6 months for reevaluation.  Call sooner if concerns arise.  ? ?

## 2021-01-24 NOTE — Assessment & Plan Note (Addendum)
Chronic.  Controlled.  A1c is 7.4.  Continue with current medication regimen.  Labs ordered today.  Return to clinic in 6 months for reevaluation.  Call sooner if concerns arise.

## 2021-01-25 LAB — HEMOGLOBIN A1C
Est. average glucose Bld gHb Est-mCnc: 166 mg/dL
Hgb A1c MFr Bld: 7.4 % — ABNORMAL HIGH (ref 4.8–5.6)

## 2021-01-25 LAB — CBC WITH DIFFERENTIAL/PLATELET
Basophils Absolute: 0 10*3/uL (ref 0.0–0.2)
Basos: 1 %
EOS (ABSOLUTE): 0.1 10*3/uL (ref 0.0–0.4)
Eos: 2 %
Hematocrit: 40.9 % (ref 34.0–46.6)
Hemoglobin: 13.8 g/dL (ref 11.1–15.9)
Immature Grans (Abs): 0 10*3/uL (ref 0.0–0.1)
Immature Granulocytes: 0 %
Lymphocytes Absolute: 1.6 10*3/uL (ref 0.7–3.1)
Lymphs: 24 %
MCH: 30.6 pg (ref 26.6–33.0)
MCHC: 33.7 g/dL (ref 31.5–35.7)
MCV: 91 fL (ref 79–97)
Monocytes Absolute: 0.6 10*3/uL (ref 0.1–0.9)
Monocytes: 9 %
Neutrophils Absolute: 4.2 10*3/uL (ref 1.4–7.0)
Neutrophils: 64 %
Platelets: 156 10*3/uL (ref 150–450)
RBC: 4.51 x10E6/uL (ref 3.77–5.28)
RDW: 13.4 % (ref 11.7–15.4)
WBC: 6.5 10*3/uL (ref 3.4–10.8)

## 2021-01-25 LAB — COMPREHENSIVE METABOLIC PANEL
ALT: 16 IU/L (ref 0–32)
AST: 26 IU/L (ref 0–40)
Albumin/Globulin Ratio: 1.6 (ref 1.2–2.2)
Albumin: 4.4 g/dL (ref 3.7–4.7)
Alkaline Phosphatase: 55 IU/L (ref 44–121)
BUN/Creatinine Ratio: 13 (ref 12–28)
BUN: 18 mg/dL (ref 8–27)
Bilirubin Total: 0.4 mg/dL (ref 0.0–1.2)
CO2: 23 mmol/L (ref 20–29)
Calcium: 9.5 mg/dL (ref 8.7–10.3)
Chloride: 103 mmol/L (ref 96–106)
Creatinine, Ser: 1.36 mg/dL — ABNORMAL HIGH (ref 0.57–1.00)
Globulin, Total: 2.7 g/dL (ref 1.5–4.5)
Glucose: 131 mg/dL — ABNORMAL HIGH (ref 70–99)
Potassium: 4.1 mmol/L (ref 3.5–5.2)
Sodium: 143 mmol/L (ref 134–144)
Total Protein: 7.1 g/dL (ref 6.0–8.5)
eGFR: 41 mL/min/{1.73_m2} — ABNORMAL LOW (ref >59)

## 2021-01-27 ENCOUNTER — Telehealth: Payer: Self-pay

## 2021-01-27 ENCOUNTER — Other Ambulatory Visit: Payer: Self-pay | Admitting: Nurse Practitioner

## 2021-01-27 ENCOUNTER — Telehealth: Payer: Self-pay | Admitting: Nurse Practitioner

## 2021-01-27 DIAGNOSIS — I10 Essential (primary) hypertension: Secondary | ICD-10-CM

## 2021-01-27 DIAGNOSIS — E119 Type 2 diabetes mellitus without complications: Secondary | ICD-10-CM

## 2021-01-27 MED ORDER — PANTOPRAZOLE SODIUM 20 MG PO TBEC: 20.0000 mg | DELAYED_RELEASE_TABLET | Freq: Every day | ORAL | 1 refills | Status: DC

## 2021-01-27 MED ORDER — GLUCOSE BLOOD VI STRP: ORAL_STRIP | 12 refills | Status: DC

## 2021-01-27 MED ORDER — ACCU-CHEK GUIDE W/DEVICE KIT: PACK | 0 refills | Status: DC

## 2021-01-27 MED ORDER — TRAZODONE HCL 100 MG PO TABS: 100.0000 mg | ORAL_TABLET | Freq: Every evening | ORAL | 1 refills | Status: DC | PRN

## 2021-01-27 NOTE — Telephone Encounter (Signed)
error 

## 2021-01-27 NOTE — Telephone Encounter (Signed)
Orders were pended per patient requested and provider [name], NP. Routed to provider for signing of the pended orders.

## 2021-01-27 NOTE — Telephone Encounter (Signed)
Pt called and stated that her insurance will not cover the  [place] test strips sent to the pharmacy on 10.19.22 and they would be over $140 / pt would like a prescription for a new ACCU CHEK meter and test strips to go with it asap sent to   [id number] - [place], [address]  [place], [place] [address]  Phone:  [phone]  Fax:  [phone]  Please advise

## 2021-01-27 NOTE — Chronic Care Management (AMB) (Signed)
    Chronic Care Management Pharmacy Assistant   Name: Anna Juarez  MRN: [id number] DOB: [birth year]  Hello, This patient is due for [facility] on [facility]/03/21. Can you please send in script refills for pantoprazole, benazepril, and trazadone? Thank you!    Andee Poles, CMA

## 2021-01-27 NOTE — Telephone Encounter (Signed)
Please see below and advise.

## 2021-01-27 NOTE — Addendum Note (Signed)
Addended by: [name] on: 01/27/2021 03:54 PM   Modules accepted: Orders

## 2021-01-27 NOTE — Progress Notes (Signed)
Please let Anna Juarez know that her lab work looks great.  Kidney function remains stable.  A1c improved some from 7.9 to 7.4 which is great news.  I will send the information over to the surgeon when they ask for it. Let me know if she has any questions.

## 2021-01-29 ENCOUNTER — Other Ambulatory Visit: Payer: Self-pay | Admitting: Nurse Practitioner

## 2021-01-29 ENCOUNTER — Ambulatory Visit
Admission: RE | Admit: 2021-01-29 | Discharge: 2021-01-29 | Disposition: A | Discharge status: Home / Self Care | Payer: Medicare PPO | Source: Ambulatory Visit | Attending: Nurse Practitioner | Admitting: Nurse Practitioner

## 2021-01-29 ENCOUNTER — Other Ambulatory Visit: Payer: Self-pay

## 2021-01-29 ENCOUNTER — Telehealth: Payer: Self-pay | Admitting: Nurse Practitioner

## 2021-01-29 DIAGNOSIS — M545 Low back pain, unspecified: Secondary | ICD-10-CM | POA: Insufficient documentation

## 2021-01-29 DIAGNOSIS — M25559 Pain in unspecified hip: Secondary | ICD-10-CM

## 2021-01-29 DIAGNOSIS — G8929 Other chronic pain: Secondary | ICD-10-CM | POA: Insufficient documentation

## 2021-01-29 NOTE — Telephone Encounter (Signed)
Order changed.

## 2021-01-31 ENCOUNTER — Other Ambulatory Visit: Payer: Self-pay

## 2021-01-31 ENCOUNTER — Ambulatory Visit: Payer: Medicare PPO | Admitting: Surgery

## 2021-01-31 ENCOUNTER — Encounter: Payer: Self-pay | Admitting: Surgery

## 2021-01-31 VITALS — BP 135/79 | HR 60 | Temp 97.8°F | Ht 62.0 in | Wt 301.0 lb

## 2021-01-31 DIAGNOSIS — K8020 Calculus of gallbladder without cholecystitis without obstruction: Secondary | ICD-10-CM

## 2021-01-31 MED ORDER — BENAZEPRIL HCL 40 MG PO TABS: 40.0000 mg | ORAL_TABLET | Freq: Every day | ORAL | 1 refills | Status: DC

## 2021-01-31 NOTE — Patient Instructions (Signed)
You have requested to have your gallbladder removed. This will be done at [facility] with Dr. Hampton Abbot.  You will most likely be out of work 1-2 weeks for this surgery. You will return after your post-op appointment with a lifting restriction for approximately 4 more weeks.  You will be able to eat anything you would like to following surgery. But, start by eating a bland diet and advance this as tolerated. The Gallbladder diet is below, please go as closely by this diet as possible prior to surgery to avoid any further attacks.  Please see the (blue)pre-care form that you have been given today. Our surgery scheduler will call you to review surgery date and to go over information for surgery.   If you have any questions, please call our office.  Laparoscopic Cholecystectomy Laparoscopic cholecystectomy is surgery to remove the gallbladder. The gallbladder is located in the upper right part of the abdomen, behind the liver. It is a storage sac for bile, which is produced in the liver. Bile aids in the digestion and absorption of fats. Cholecystectomy is often done for inflammation of the gallbladder (cholecystitis). This condition is usually caused by a buildup of gallstones (cholelithiasis) in the gallbladder. Gallstones can block the flow of bile, and that can result in inflammation and pain. In severe cases, emergency surgery may be required. If emergency surgery is not required, you will have time to prepare for the procedure. Laparoscopic surgery is an alternative to open surgery. Laparoscopic surgery has a shorter recovery time. Your common bile duct may also need to be examined during the procedure. If stones are found in the common bile duct, they may be removed. LET [facility] CARE PROVIDER KNOW ABOUT: Any allergies you have. All medicines you are taking, including vitamins, herbs, eye drops, creams, and over-the-counter medicines. Previous problems you or members of your family have  had with the use of anesthetics. Any blood disorders you have. Previous surgeries you have had.  Any medical conditions you have. RISKS AND COMPLICATIONS Generally, this is a safe procedure. However, problems may occur, including: Infection. Bleeding. Allergic reactions to medicines. Damage to other structures or organs. A stone remaining in the common bile duct. A bile leak from the cyst duct that is clipped when your gallbladder is removed. The need to convert to open surgery, which requires a larger incision in the abdomen. This may be necessary if your surgeon thinks that it is not safe to continue with a laparoscopic procedure. BEFORE THE PROCEDURE Ask your health care provider about: Changing or stopping your regular medicines. This is especially important if you are taking diabetes medicines or blood thinners. Taking medicines such as aspirin and ibuprofen. These medicines can thin your blood. Do not take these medicines before your procedure if your health care provider instructs you not to. Follow instructions from your health care provider about eating or drinking restrictions. Let your health care provider know if you develop a cold or an infection before surgery. Plan to have someone take you home after the procedure. Ask your health care provider how your surgical site will be marked or identified. You may be given antibiotic medicine to help prevent infection. PROCEDURE To reduce your risk of infection: Your health care team will wash or sanitize their hands. Your skin will be washed with soap. An IV tube may be inserted into one of your veins. You will be given a medicine to make you fall asleep (general anesthetic). A breathing tube will be  placed in your mouth. The surgeon will make several small cuts (incisions) in your abdomen. A thin, lighted tube (laparoscope) that has a tiny camera on the end will be inserted through one of the small incisions. The camera on the  laparoscope will send a picture to a TV screen (monitor) in the operating room. This will give the surgeon a good view inside your abdomen. A gas will be pumped into your abdomen. This will expand your abdomen to give the surgeon more room to perform the surgery. Other tools that are needed for the procedure will be inserted through the other incisions. The gallbladder will be removed through one of the incisions. After your gallbladder has been removed, the incisions will be closed with stitches (sutures), staples, or skin glue. Your incisions may be covered with a bandage (dressing). The procedure may vary among health care providers and hospitals. AFTER THE PROCEDURE Your blood pressure, heart rate, breathing rate, and blood oxygen level will be monitored often until the medicines you were given have worn off. You will be given medicines as needed to control your pain.   This information is not intended to replace advice given to you by your health care provider. Make sure you discuss any questions you have with your health care provider.   Document Released: 03/23/2005 Document Revised: 12/12/2014 Document Reviewed: 11/02/2012 Elsevier Interactive Patient Education 2016 [place] Diet for Gallbladder Conditions A low-fat diet can be helpful if you have pancreatitis or a gallbladder condition. With these conditions, your pancreas and gallbladder have trouble digesting fats. A healthy eating plan with less fat will help rest your pancreas and gallbladder and reduce your symptoms. WHAT DO I NEED TO KNOW ABOUT THIS DIET? Eat a low-fat diet. Reduce your fat intake to less than 20-30% of your total daily calories. This is less than 50-60 g of fat per day. Remember that you need some fat in your diet. Ask your dietician what your daily goal should be. Choose nonfat and low-fat healthy foods. Look for the words "nonfat," "low fat," or "fat free." As a guide, look on the label and  choose foods with less than 3 g of fat per serving. Eat only one serving. Avoid alcohol. Do not smoke. If you need help quitting, talk with your health care provider. Eat small frequent meals instead of three large heavy meals. WHAT FOODS CAN I EAT? Grains Include healthy grains and starches such as potatoes, wheat bread, fiber-rich cereal, and brown rice. Choose whole grain options whenever possible. In adults, whole grains should account for 45-65% of your daily calories.  Fruits and Vegetables Eat plenty of fruits and vegetables. Fresh fruits and vegetables add fiber to your diet. Meats and Other Protein Sources Eat lean meat such as chicken and pork. Trim any fat off of meat before cooking it. Eggs, fish, and beans are other sources of protein. In adults, these foods should account for 10-35% of your daily calories. Dairy Choose low-fat milk and dairy options. Dairy includes fat and protein, as well as calcium.  Fats and Oils Limit high-fat foods such as fried foods, sweets, baked goods, sugary drinks.  Other Creamy sauces and condiments, such as mayonnaise, can add extra fat. Think about whether or not you need to use them, or use smaller amounts or low fat options. WHAT FOODS ARE NOT RECOMMENDED? High fat foods, such as: [employer]. Ice cream. [place] toast. Sweet rolls. Pizza. Cheese bread. Foods covered with batter, butter, creamy  sauces, or cheese. Fried foods. Sugary drinks and desserts. Foods that cause gas or bloating   This information is not intended to replace advice given to you by your health care provider. Make sure you discuss any questions you have with your health care provider.   Document Released: 03/28/2013 Document Reviewed: 03/28/2013 Elsevier Interactive Patient Education [employer].

## 2021-01-31 NOTE — Progress Notes (Signed)
01/31/2021  Reason for Visit:  Symptomatic cholelithiasis  Requesting Provider:  Vonda Antigua, MD  History of Present Illness: Anna Juarez is a 70 y.o. female presenting for evaluation of abdominal pain associated with nausea.  The patient reports that over the last month or so, she has been experiencing epigastric and right upper quadrant pain episodes that last a few hours, up to 5 hours each, which sometimes radiates to the right shoulder.  She reports associated nausea with these episodes but has not had emesis.  However due to the nausea, sometimes she feels that she needs to step outside to get fresh air.  These episodes are happening more frequently and after any p.o. intake.  The patient saw her PCP on 12/27/2020 and a right upper quadrant ultrasound was obtained on 01/07/2021.  This showed cholelithiasis with a stone within the gallbladder but no changes reflecting cholecystitis.  She was also referred to Dr. Bonna Gains for her pain and based on her results, she referred the patient to [place] for surgical evaluation.  Patient denies any fevers, chills, chest pain, shortness of breath.  She reports that from time to time, her heart skips a beat and she feels that during the pain episodes this happens more frequently.  She was seen by her PCP again on 01/24/2021 and EKG at that point showed only sinus bradycardia but no other conduction defects.  Given the active referral for surgery at that point, she also saw the patient for clearance for surgery and she is currently cleared for cholecystectomy.  Past Medical History: Past Medical History:  Diagnosis Date   Chronic kidney disease    Diabetes mellitus without complication ([place])    Extreme obesity    Fatty liver    Hyperlipidemia    Hypertension    Radiculopathy, cervical    Renal insufficiency    pt states she has chronic kidney disease.      Past Surgical History: Past Surgical History:  Procedure Laterality Date   ABDOMINAL  HYSTERECTOMY     JOINT REPLACEMENT     SPINE SURGERY      Home Medications: Prior to Admission medications   Medication Sig Start Date End Date Taking? Authorizing Provider  acetaminophen (TYLENOL) 500 MG tablet Take 500 mg by mouth as needed.    [provider]  benazepril (LOTENSIN) 40 MG tablet Take 1 tablet (40 mg total) by mouth daily. 01/31/21   [name], NP  Blood Glucose Monitoring Suppl (ACCU-CHEK GUIDE) w/Device KIT Use daily to check glucose level. 01/27/21   [name], NP  cyclobenzaprine (FLEXERIL) 5 MG tablet Take 1 tablet (5 mg total) by mouth 3 (three) times daily as needed for muscle spasms. 10/16/20   [name], NP  dapagliflozin propanediol (FARXIGA) 10 MG TABS tablet Take 1 tablet (10 mg total) by mouth daily before breakfast. 06/27/20   [name], NP  glipiZIDE (GLUCOTROL) 5 MG tablet Take 1 tablet (5 mg total) by mouth 2 (two) times daily before a meal. 08/06/20   [name], NP  glucose blood test strip Use as instructed 01/27/21   [name], NP  lovastatin (MEVACOR) 40 MG tablet TAKE 1 TABLET BY MOUTH EVERYDAY AT BEDTIME 08/06/20   [name], NP  metoprolol succinate (TOPROL-XL) 50 MG 24 hr tablet Take 1 tablet (50 mg total) by mouth daily. Take with or immediately following a meal. 08/06/20   [name], NP  pantoprazole (PROTONIX) 20 MG tablet Take 1 tablet (20 mg total) by mouth  daily. 01/27/21   [name], NP  pregabalin (LYRICA) 150 MG capsule TAKE ONE CAPSULE BY MOUTH THREE TIMES DAILY 01/20/21   [name], NP  traMADol (ULTRAM) 50 MG tablet Take 1 tablet (50 mg total) by mouth every 8 (eight) hours as needed. 01/13/21   [name], NP  traZODone (DESYREL) 100 MG tablet Take 1 tablet (100 mg total) by mouth at bedtime as needed for sleep. 01/27/21   [name], NP    Allergies: Allergies  Allergen Reactions   Actos [Pioglitazone]    Metformin And Related Diarrhea    Scopolamine Nausea And Vomiting   Victoza [Liraglutide] Nausea And Vomiting    Social History:  reports that she has never smoked. She has never used smokeless tobacco. She reports that she does not drink alcohol and does not use drugs.   Family History: Family History  Problem Relation Age of Onset   Diabetes Mother    Heart disease Mother    Hypertension Mother    Heart disease Father    Cancer Brother    Hypertension Brother    Breast cancer Maternal Aunt    Breast cancer Paternal Aunt     Review of Systems: Review of Systems  Constitutional:  Negative for chills and fever.  HENT:  Negative for hearing loss.   Respiratory:  Negative for shortness of breath.   Cardiovascular:  Negative for chest pain.  Gastrointestinal:  Positive for abdominal pain and nausea. Negative for constipation, diarrhea and vomiting.  Genitourinary:  Negative for dysuria.  Musculoskeletal:  Negative for myalgias.  Skin:  Negative for rash.  Neurological:  Negative for dizziness.  Psychiatric/Behavioral:  Negative for depression.    Physical Exam BP 135/79   Pulse 60   Temp 97.8 F (36.6 C)   Ht 5' 2" (1.575 m)   Wt (!) 301 lb (136.5 kg)   SpO2 97%   BMI 55.05 kg/m  CONSTITUTIONAL: No acute distress HEENT:  Normocephalic, atraumatic, extraocular motion intact. NECK: Trachea is midline, and there is no jugular venous distension.  RESPIRATORY:  Lungs are clear, and breath sounds are equal bilaterally. Normal respiratory effort without pathologic use of accessory muscles. CARDIOVASCULAR: Heart is regular without murmurs, gallops, or rubs. GI: The abdomen is soft, obese, nondistended, with tenderness to palpation in the epigastric and right upper quadrant areas.  Negative Murphy's sign.  MUSCULOSKELETAL:  Normal muscle strength and tone in all four extremities.  No peripheral edema or cyanosis. SKIN: Skin turgor is normal. There are no pathologic skin lesions.  NEUROLOGIC:  Motor and sensation  is grossly normal.  Cranial nerves are grossly intact. PSYCH:  Alert and oriented to person, place and time. Affect is normal.  Laboratory Analysis: Labs from 01/24/2021: Sodium 143, potassium 4.1, chloride 103, CO2 23, BUN 18, creatinine 1.36.  Total bilirubin 0.4, AST 26, ALT 16, alkaline phosphatase 55, albumin 4.4.  WBC 6.5, hemoglobin 13.8, hematocrit 40.9, platelets 156.  Hemoglobin A1c 7.4.  Imaging: Ultrasound RUQ on 01/07/2021: IMPRESSION: 1. Cholelithiasis without sonographic evidence of acute cholecystitis. 2. Fatty liver.  Assessment and Plan: This is a 70 y.o. female with symptomatic cholelithiasis.  - Discussed with the patient the role and function of the gallbladder and how she is having pain related to the stone that she has in her gallbladder.  Discussed with her also that although the gallbladder has a function in the body, it is not a critical function and it can be removed.  Given her symptoms are happening with  any particular p.o. intake, I do not think that only changing to a low-fat diet would be of much help.  She is in agreement.  Reviewed with her my plan for a robotic cholecystectomy and discussed with her the surgery at length, as well as the risks of bleeding, infection, injury to surrounding structures, postoperative recovery and activity restrictions, that this would be an outpatient surgery, and she is willing to proceed. - Patient has already been seen by her PCP and cleared for surgery. - We will schedule the patient for 02/06/2021.  I spent 80 minutes dedicated to the care of this patient on the date of this encounter to include pre-visit review of records, face-to-face time with the patient discussing diagnosis and management, and any post-visit coordination of care.   Melvyn Neth, [facility]

## 2021-01-31 NOTE — H&P (View-Only) (Signed)
01/31/2021  Reason for Visit:  Symptomatic cholelithiasis  Requesting Provider:  Vonda Antigua, MD  History of Present Illness: Anna Juarez is a 70 y.o. female presenting for evaluation of abdominal pain associated with nausea.  The patient reports that over the last month or so, she has been experiencing epigastric and right upper quadrant pain episodes that last a few hours, up to 5 hours each, which sometimes radiates to the right shoulder.  She reports associated nausea with these episodes but has not had emesis.  However due to the nausea, sometimes she feels that she needs to step outside to get fresh air.  These episodes are happening more frequently and after any p.o. intake.  The patient saw her PCP on 12/27/2020 and a right upper quadrant ultrasound was obtained on 01/07/2021.  This showed cholelithiasis with a stone within the gallbladder but no changes reflecting cholecystitis.  She was also referred to Dr. Bonna Gains for her pain and based on her results, she referred the patient to [place] for surgical evaluation.  Patient denies any fevers, chills, chest pain, shortness of breath.  She reports that from time to time, her heart skips a beat and she feels that during the pain episodes this happens more frequently.  She was seen by her PCP again on 01/24/2021 and EKG at that point showed only sinus bradycardia but no other conduction defects.  Given the active referral for surgery at that point, she also saw the patient for clearance for surgery and she is currently cleared for cholecystectomy.  Past Medical History: Past Medical History:  Diagnosis Date   Chronic kidney disease    Diabetes mellitus without complication ([place])    Extreme obesity    Fatty liver    Hyperlipidemia    Hypertension    Radiculopathy, cervical    Renal insufficiency    pt states she has chronic kidney disease.      Past Surgical History: Past Surgical History:  Procedure Laterality Date   ABDOMINAL  HYSTERECTOMY     JOINT REPLACEMENT     SPINE SURGERY      Home Medications: Prior to Admission medications   Medication Sig Start Date End Date Taking? Authorizing Provider  acetaminophen (TYLENOL) 500 MG tablet Take 500 mg by mouth as needed.    [provider]  benazepril (LOTENSIN) 40 MG tablet Take 1 tablet (40 mg total) by mouth daily. 01/31/21   [name], NP  Blood Glucose Monitoring Suppl (ACCU-CHEK GUIDE) w/Device KIT Use daily to check glucose level. 01/27/21   [name], NP  cyclobenzaprine (FLEXERIL) 5 MG tablet Take 1 tablet (5 mg total) by mouth 3 (three) times daily as needed for muscle spasms. 10/16/20   [name], NP  dapagliflozin propanediol (FARXIGA) 10 MG TABS tablet Take 1 tablet (10 mg total) by mouth daily before breakfast. 06/27/20   [name], NP  glipiZIDE (GLUCOTROL) 5 MG tablet Take 1 tablet (5 mg total) by mouth 2 (two) times daily before a meal. 08/06/20   [name], NP  glucose blood test strip Use as instructed 01/27/21   [name], NP  lovastatin (MEVACOR) 40 MG tablet TAKE 1 TABLET BY MOUTH EVERYDAY AT BEDTIME 08/06/20   [name], NP  metoprolol succinate (TOPROL-XL) 50 MG 24 hr tablet Take 1 tablet (50 mg total) by mouth daily. Take with or immediately following a meal. 08/06/20   [name], NP  pantoprazole (PROTONIX) 20 MG tablet Take 1 tablet (20 mg total) by mouth  daily. 01/27/21   Holdsworth, Karen, NP  pregabalin (LYRICA) 150 MG capsule TAKE ONE CAPSULE BY MOUTH THREE TIMES DAILY 01/20/21   Holdsworth, Karen, NP  traMADol (ULTRAM) 50 MG tablet Take 1 tablet (50 mg total) by mouth every 8 (eight) hours as needed. 01/13/21   Holdsworth, Karen, NP  traZODone (DESYREL) 100 MG tablet Take 1 tablet (100 mg total) by mouth at bedtime as needed for sleep. 01/27/21   Holdsworth, Karen, NP    Allergies: Allergies  Allergen Reactions   Actos [Pioglitazone]    Metformin And Related Diarrhea    Scopolamine Nausea And Vomiting   Victoza [Liraglutide] Nausea And Vomiting    Social History:  reports that she has never smoked. She has never used smokeless tobacco. She reports that she does not drink alcohol and does not use drugs.   Family History: Family History  Problem Relation Age of Onset   Diabetes Mother    Heart disease Mother    Hypertension Mother    Heart disease Father    Cancer Brother    Hypertension Brother    Breast cancer Maternal Aunt    Breast cancer Paternal Aunt     Review of Systems: Review of Systems  Constitutional:  Negative for chills and fever.  HENT:  Negative for hearing loss.   Respiratory:  Negative for shortness of breath.   Cardiovascular:  Negative for chest pain.  Gastrointestinal:  Positive for abdominal pain and nausea. Negative for constipation, diarrhea and vomiting.  Genitourinary:  Negative for dysuria.  Musculoskeletal:  Negative for myalgias.  Skin:  Negative for rash.  Neurological:  Negative for dizziness.  Psychiatric/Behavioral:  Negative for depression.    Physical Exam BP 135/79   Pulse 60   Temp 97.8 F (36.6 C)   Ht 5' 2" (1.575 m)   Wt (!) 301 lb (136.5 kg)   SpO2 97%   BMI 55.05 kg/m  CONSTITUTIONAL: No acute distress HEENT:  Normocephalic, atraumatic, extraocular motion intact. NECK: Trachea is midline, and there is no jugular venous distension.  RESPIRATORY:  Lungs are clear, and breath sounds are equal bilaterally. Normal respiratory effort without pathologic use of accessory muscles. CARDIOVASCULAR: Heart is regular without murmurs, gallops, or rubs. GI: The abdomen is soft, obese, nondistended, with tenderness to palpation in the epigastric and right upper quadrant areas.  Negative Murphy's sign.  MUSCULOSKELETAL:  Normal muscle strength and tone in all four extremities.  No peripheral edema or cyanosis. SKIN: Skin turgor is normal. There are no pathologic skin lesions.  NEUROLOGIC:  Motor and sensation  is grossly normal.  Cranial nerves are grossly intact. PSYCH:  Alert and oriented to person, place and time. Affect is normal.  Laboratory Analysis: Labs from 01/24/2021: Sodium 143, potassium 4.1, chloride 103, CO2 23, BUN 18, creatinine 1.36.  Total bilirubin 0.4, AST 26, ALT 16, alkaline phosphatase 55, albumin 4.4.  WBC 6.5, hemoglobin 13.8, hematocrit 40.9, platelets 156.  Hemoglobin A1c 7.4.  Imaging: Ultrasound RUQ on 01/07/2021: IMPRESSION: 1. Cholelithiasis without sonographic evidence of acute cholecystitis. 2. Fatty liver.  Assessment and Plan: This is a 70 y.o. female with symptomatic cholelithiasis.  - Discussed with the patient the role and function of the gallbladder and how she is having pain related to the stone that she has in her gallbladder.  Discussed with her also that although the gallbladder has a function in the body, it is not a critical function and it can be removed.  Given her symptoms are happening with   any particular p.o. intake, I do not think that only changing to a low-fat diet would be of much help.  She is in agreement.  Reviewed with her my plan for a robotic cholecystectomy and discussed with her the surgery at length, as well as the risks of bleeding, infection, injury to surrounding structures, postoperative recovery and activity restrictions, that this would be an outpatient surgery, and she is willing to proceed. - Patient has already been seen by her PCP and cleared for surgery. - We will schedule the patient for 02/06/2021.  I spent 80 minutes dedicated to the care of this patient on the date of this encounter to include pre-visit review of records, face-to-face time with the patient discussing diagnosis and management, and any post-visit coordination of care.   Melvyn Neth, [facility]

## 2021-01-31 NOTE — Addendum Note (Signed)
Addended by: [name] on: 01/31/2021 08:31 AM   Modules accepted: Orders

## 2021-02-03 ENCOUNTER — Telehealth: Payer: Self-pay | Admitting: Surgery

## 2021-02-03 NOTE — Progress Notes (Signed)
Please let Ms. 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  In the right hip she has a labrum tear.  I recommend she see Orthopedics for further management.  If she agrees, I will place the referral.

## 2021-02-03 NOTE — Telephone Encounter (Signed)
Patient has been advised of Pre-Admission date/time, COVID Testing date and Surgery date.  Surgery Date: 02/06/21 Preadmission Testing Date: 02/05/21 (phone 1p-5p) Covid Testing Date: Not needed.    Patient has been made aware to call [phone], between 1-3:00pm the day before surgery, to find out what time to arrive for surgery.

## 2021-02-04 NOTE — Progress Notes (Signed)
Please let patient know that I will be thinking about her in the next few days as she prepares for surgery. I have placed the referral to Ortho.

## 2021-02-05 ENCOUNTER — Other Ambulatory Visit: Payer: Self-pay

## 2021-02-05 ENCOUNTER — Encounter
Admission: RE | Admit: 2021-02-05 | Discharge: 2021-02-05 | Disposition: A | Discharge status: Home / Self Care | Payer: Medicare PPO | Source: Ambulatory Visit | Attending: Surgery | Admitting: Surgery

## 2021-02-05 HISTORY — DX: Other specified postprocedural states: R11.2

## 2021-02-05 HISTORY — DX: Sleep apnea, unspecified: G47.30

## 2021-02-05 HISTORY — DX: Gastro-esophageal reflux disease without esophagitis: K21.9

## 2021-02-05 HISTORY — DX: Unspecified osteoarthritis, unspecified site: M19.90

## 2021-02-05 HISTORY — DX: Palpitations: R00.2

## 2021-02-05 HISTORY — DX: Other complications of anesthesia, initial encounter: T88.59XA

## 2021-02-05 HISTORY — DX: Other specified postprocedural states: Z98.890

## 2021-02-05 NOTE — Patient Instructions (Addendum)
Your procedure is scheduled on:02-06-21 Thursday Report to the Registration Desk on the 1st floor of the [facility].Then proceed to the 2nd floor Surgery Desk in the [facility] To find out your arrival time, please call [phone] between 1PM - 3PM on:02-05-21 Wednesday  REMEMBER: Instructions that are not followed completely may result in serious medical risk, up to and including death; or upon the discretion of your surgeon and anesthesiologist your surgery may need to be rescheduled.  Do not eat food after midnight the night before surgery.  No gum chewing, lozengers or hard candies.  You may however, drink Water up to 2 hours before you are scheduled to arrive for your surgery. Do not drink anything within 2 hours of your scheduled arrival time.  Type 1 and Type 2 diabetics should only drink water (If your blood sugar drops the morning of surgery, you may drink  apple juice up to 2 hours prior to your arrival time)  TAKE THESE MEDICATIONS THE MORNING OF SURGERY WITH A SIP OF WATER: -metoprolol succinate (TOPROL-XL) 50 MG 24 hr tablet -pregabalin (LYRICA) 150 MG capsule -pantoprazole (PROTONIX) 20 MG tablet (take an extra dose tonight before bed and take another one the morning of surgery) -You may take traMADol (ULTRAM) 50 MG tablet or acetaminophen (TYLENOL) 500 MG tablet if needed for pain  Last dose of dapagliflozin propanediol (FARXIGA) 10 MG TABS tablet was today (02-05-21)  One week prior to surgery: Stop Anti-inflammatories (NSAIDS) such as Advil, Aleve, Ibuprofen, Motrin, Naproxen, Naprosyn and Aspirin based products such as Excedrin, Goodys Powder, BC Powder. You may however, continue to take Tylenol/Tramadol if needed for pain up until the day of surgery.  No Alcohol for 24 hours before or after surgery.  No Smoking including e-cigarettes for 24 hours prior to surgery.  No chewable tobacco products for at least 6 hours prior to surgery.  No nicotine patches on the day  of surgery.  Do not use any "recreational" drugs for at least a week prior to your surgery.  Please be advised that the combination of cocaine and anesthesia may have negative outcomes, up to and including death. If you test positive for cocaine, your surgery will be cancelled.  On the morning of surgery brush your teeth with toothpaste and water, you may rinse your mouth with mouthwash if you wish. Do not swallow any toothpaste or mouthwash.  Do not wear jewelry, make-up, hairpins, clips or nail polish.  Do not wear lotions, powders, or perfumes.   Do not shave body from the neck down 48 hours prior to surgery just in case you cut yourself which could leave a site for infection.  Also, freshly shaved skin may become irritated if using the CHG soap.  Contact lenses, hearing aids and dentures may not be worn into surgery.  Do not bring valuables to the hospital. [facility] is not responsible for any missing/lost belongings or valuables.   Bring your C-PAP to the hospital with you  Notify your doctor if there is any change in your medical condition (cold, fever, infection).  Wear comfortable clothing (specific to your surgery type) to the hospital.  After surgery, you can help prevent lung complications by doing breathing exercises.  Take deep breaths and cough every 1-2 hours. Your doctor may order a device called an Incentive Spirometer to help you take deep breaths. When coughing or sneezing, hold a pillow firmly against your incision with both hands. This is called "splinting." Doing this helps protect  your incision. It also decreases belly discomfort.  If you are being admitted to the hospital overnight, leave your suitcase in the car. After surgery it may be brought to your room.  If you are being discharged the day of surgery, you will not be allowed to drive home. You will need a responsible adult (18 years or older) to drive you home and stay with you that night.   If you  are taking public transportation, you will need to have a responsible adult (18 years or older) with you. Please confirm with your physician that it is acceptable to use public transportation.   Please call the [facility] at [phone] if you have any questions about these instructions.  Surgery Visitation Policy:  Patients undergoing a surgery or procedure may have one family member or support person with them as long as that person is not COVID-19 positive or experiencing its symptoms.  That person may remain in the waiting area during the procedure and may rotate out with other people.  Inpatient Visitation:    Visiting hours are 7 a.m. to 8 p.m. Up to two visitors ages 16+ are allowed at one time in a patient room. The visitors may rotate out with other people during the day. Visitors must check out when they leave, or other visitors will not be allowed. One designated support person may remain overnight. The visitor must pass COVID-19 screenings, use hand sanitizer when entering and exiting the patient's room and wear a mask at all times, including in the patient's room. Patients must also wear a mask when staff or their visitor are in the room. Masking is required regardless of vaccination status.

## 2021-02-06 ENCOUNTER — Encounter
Admission: RE | Disposition: A | Discharge status: Home / Self Care | Payer: Self-pay | Source: Ambulatory Visit | Attending: Surgery

## 2021-02-06 ENCOUNTER — Ambulatory Visit: Payer: Medicare PPO | Admitting: Anesthesiology

## 2021-02-06 ENCOUNTER — Ambulatory Visit: Payer: Medicare PPO | Admitting: Gastroenterology

## 2021-02-06 ENCOUNTER — Encounter: Payer: Self-pay | Admitting: Surgery

## 2021-02-06 ENCOUNTER — Ambulatory Visit
Admission: RE | Admit: 2021-02-06 | Discharge: 2021-02-06 | Disposition: A | Discharge status: Home / Self Care | Payer: Medicare PPO | Source: Ambulatory Visit | Attending: Surgery | Admitting: Surgery

## 2021-02-06 DIAGNOSIS — N189 Chronic kidney disease, unspecified: Secondary | ICD-10-CM | POA: Insufficient documentation

## 2021-02-06 DIAGNOSIS — Z79899 Other long term (current) drug therapy: Secondary | ICD-10-CM | POA: Diagnosis not present

## 2021-02-06 DIAGNOSIS — Z7984 Long term (current) use of oral hypoglycemic drugs: Secondary | ICD-10-CM | POA: Diagnosis not present

## 2021-02-06 DIAGNOSIS — Z888 Allergy status to other drugs, medicaments and biological substances status: Secondary | ICD-10-CM | POA: Insufficient documentation

## 2021-02-06 DIAGNOSIS — K8020 Calculus of gallbladder without cholecystitis without obstruction: Secondary | ICD-10-CM | POA: Diagnosis present

## 2021-02-06 DIAGNOSIS — E1122 Type 2 diabetes mellitus with diabetic chronic kidney disease: Secondary | ICD-10-CM | POA: Insufficient documentation

## 2021-02-06 DIAGNOSIS — I129 Hypertensive chronic kidney disease with stage 1 through stage 4 chronic kidney disease, or unspecified chronic kidney disease: Secondary | ICD-10-CM | POA: Diagnosis not present

## 2021-02-06 DIAGNOSIS — K760 Fatty (change of) liver, not elsewhere classified: Secondary | ICD-10-CM | POA: Insufficient documentation

## 2021-02-06 DIAGNOSIS — K8010 Calculus of gallbladder with chronic cholecystitis without obstruction: Secondary | ICD-10-CM | POA: Diagnosis not present

## 2021-02-06 LAB — GLUCOSE, CAPILLARY
Glucose-Capillary: 146 mg/dL — ABNORMAL HIGH (ref 70–99)
Glucose-Capillary: 192 mg/dL — ABNORMAL HIGH (ref 70–99)

## 2021-02-06 SURGERY — CHOLECYSTECTOMY, ROBOT-ASSISTED, LAPAROSCOPIC
Anesthesia: General

## 2021-02-06 MED ORDER — FENTANYL CITRATE (PF) 100 MCG/2ML IJ SOLN
INTRAMUSCULAR | Status: DC | PRN
  Administered 2021-02-06 (×2): 50 ug via INTRAVENOUS

## 2021-02-06 MED ORDER — ACETAMINOPHEN 500 MG PO TABS
ORAL_TABLET | ORAL | Status: AC
  Administered 2021-02-06: 1000 mg via ORAL
  Filled 2021-02-06: qty 2

## 2021-02-06 MED ORDER — INDOCYANINE GREEN 25 MG IV SOLR
2.5000 mg | INTRAVENOUS | Status: AC
  Administered 2021-02-06: 2.5 mg via INTRAVENOUS
  Filled 2021-02-06: qty 1

## 2021-02-06 MED ORDER — LIDOCAINE HCL (CARDIAC) PF 100 MG/5ML IV SOSY
PREFILLED_SYRINGE | INTRAVENOUS | Status: DC | PRN
  Administered 2021-02-06: 50 mg via INTRAVENOUS

## 2021-02-06 MED ORDER — SODIUM CHLORIDE 0.9 % IR SOLN
Status: DC | PRN
  Administered 2021-02-06: 50 mL

## 2021-02-06 MED ORDER — ROCURONIUM BROMIDE 10 MG/ML (PF) SYRINGE
PREFILLED_SYRINGE | INTRAVENOUS | Status: AC
  Filled 2021-02-06: qty 10

## 2021-02-06 MED ORDER — DEXTROSE 5 % IV SOLN
3.0000 g | INTRAVENOUS | Status: AC
  Administered 2021-02-06: 3 g via INTRAVENOUS
  Filled 2021-02-06: qty 3000

## 2021-02-06 MED ORDER — GABAPENTIN 100 MG PO CAPS: 200.0000 mg | ORAL_CAPSULE | ORAL | Status: AC

## 2021-02-06 MED ORDER — OXYCODONE HCL 5 MG PO TABS: 5.0000 mg | ORAL_TABLET | Freq: Once | ORAL | Status: AC | PRN

## 2021-02-06 MED ORDER — GLYCOPYRROLATE 0.2 MG/ML IJ SOLN
INTRAMUSCULAR | Status: DC | PRN
  Administered 2021-02-06: .2 mg via INTRAVENOUS

## 2021-02-06 MED ORDER — GABAPENTIN 100 MG PO CAPS
ORAL_CAPSULE | ORAL | Status: AC
  Administered 2021-02-06: 200 mg via ORAL
  Filled 2021-02-06: qty 2

## 2021-02-06 MED ORDER — SODIUM CHLORIDE 0.9 % IV SOLN: INTRAVENOUS | Status: DC

## 2021-02-06 MED ORDER — 0.9 % SODIUM CHLORIDE (POUR BTL) OPTIME
TOPICAL | Status: DC | PRN
  Administered 2021-02-06: 100 mL

## 2021-02-06 MED ORDER — EPHEDRINE 5 MG/ML INJ
INTRAVENOUS | Status: AC
  Filled 2021-02-06: qty 5

## 2021-02-06 MED ORDER — OXYCODONE HCL 5 MG PO TABS: 5.0000 mg | ORAL_TABLET | ORAL | 0 refills | Status: DC | PRN

## 2021-02-06 MED ORDER — APREPITANT 40 MG PO CAPS
ORAL_CAPSULE | ORAL | Status: AC
  Administered 2021-02-06: 40 mg
  Filled 2021-02-06: qty 1

## 2021-02-06 MED ORDER — ROCURONIUM BROMIDE 100 MG/10ML IV SOLN
INTRAVENOUS | Status: DC | PRN
  Administered 2021-02-06: 60 mg via INTRAVENOUS

## 2021-02-06 MED ORDER — LIDOCAINE HCL (PF) 2 % IJ SOLN
INTRAMUSCULAR | Status: AC
  Filled 2021-02-06: qty 5

## 2021-02-06 MED ORDER — SUGAMMADEX SODIUM 500 MG/5ML IV SOLN
INTRAVENOUS | Status: AC
  Filled 2021-02-06: qty 5

## 2021-02-06 MED ORDER — DEXMEDETOMIDINE (PRECEDEX) IN NS 20 MCG/5ML (4 MCG/ML) IV SYRINGE
PREFILLED_SYRINGE | INTRAVENOUS | Status: DC | PRN
  Administered 2021-02-06: 12 ug via INTRAVENOUS

## 2021-02-06 MED ORDER — DEXAMETHASONE SODIUM PHOSPHATE 10 MG/ML IJ SOLN
INTRAMUSCULAR | Status: AC
  Filled 2021-02-06: qty 1

## 2021-02-06 MED ORDER — BUPIVACAINE LIPOSOME 1.3 % IJ SUSP: 20.0000 mL | Freq: Once | INTRAMUSCULAR | Status: DC

## 2021-02-06 MED ORDER — GLYCOPYRROLATE 0.2 MG/ML IJ SOLN
INTRAMUSCULAR | Status: AC
  Filled 2021-02-06: qty 1

## 2021-02-06 MED ORDER — CEFAZOLIN SODIUM-DEXTROSE 2-4 GM/100ML-% IV SOLN
INTRAVENOUS | Status: AC
  Filled 2021-02-06: qty 100

## 2021-02-06 MED ORDER — PROPOFOL 10 MG/ML IV BOLUS
INTRAVENOUS | Status: AC
  Filled 2021-02-06: qty 20

## 2021-02-06 MED ORDER — FENTANYL CITRATE (PF) 100 MCG/2ML IJ SOLN
INTRAMUSCULAR | Status: AC
  Administered 2021-02-06: 25 ug via INTRAVENOUS
  Filled 2021-02-06: qty 2

## 2021-02-06 MED ORDER — SUCCINYLCHOLINE CHLORIDE 200 MG/10ML IV SOSY
PREFILLED_SYRINGE | INTRAVENOUS | Status: DC | PRN
  Administered 2021-02-06: 160 mg via INTRAVENOUS

## 2021-02-06 MED ORDER — BUPIVACAINE-EPINEPHRINE (PF) 0.25% -1:200000 IJ SOLN
INTRAMUSCULAR | Status: DC | PRN
  Administered 2021-02-06: 30 mL via PERINEURAL

## 2021-02-06 MED ORDER — SUCCINYLCHOLINE CHLORIDE 200 MG/10ML IV SOSY
PREFILLED_SYRINGE | INTRAVENOUS | Status: AC
  Filled 2021-02-06: qty 10

## 2021-02-06 MED ORDER — EPHEDRINE SULFATE 50 MG/ML IJ SOLN
INTRAMUSCULAR | Status: DC | PRN
  Administered 2021-02-06: 15 mg via INTRAVENOUS
  Administered 2021-02-06: 10 mg via INTRAVENOUS

## 2021-02-06 MED ORDER — CHLORHEXIDINE GLUCONATE CLOTH 2 % EX PADS: 6.0000 | MEDICATED_PAD | Freq: Once | CUTANEOUS | Status: DC

## 2021-02-06 MED ORDER — PROMETHAZINE HCL 25 MG/ML IJ SOLN: 6.2500 mg | INTRAMUSCULAR | Status: DC | PRN

## 2021-02-06 MED ORDER — FENTANYL CITRATE (PF) 100 MCG/2ML IJ SOLN
25.0000 ug | INTRAMUSCULAR | Status: DC | PRN
  Administered 2021-02-06 (×2): 25 ug via INTRAVENOUS

## 2021-02-06 MED ORDER — ACETAMINOPHEN 500 MG PO TABS: 1000.0000 mg | ORAL_TABLET | ORAL | Status: AC

## 2021-02-06 MED ORDER — DEXAMETHASONE SODIUM PHOSPHATE 10 MG/ML IJ SOLN
INTRAMUSCULAR | Status: DC | PRN
  Administered 2021-02-06: 10 mg via INTRAVENOUS

## 2021-02-06 MED ORDER — TRAMADOL HCL 50 MG PO TABS
50.0000 mg | ORAL_TABLET | Freq: Once | ORAL | Status: AC | PRN
  Administered 2021-02-06: 50 mg via ORAL

## 2021-02-06 MED ORDER — CHLORHEXIDINE GLUCONATE 0.12 % MT SOLN
OROMUCOSAL | Status: AC
  Filled 2021-02-06: qty 15

## 2021-02-06 MED ORDER — CHLORHEXIDINE GLUCONATE 0.12 % MT SOLN
15.0000 mL | Freq: Once | OROMUCOSAL | Status: DC
Start: 2021-02-06 — End: 2021-02-06

## 2021-02-06 MED ORDER — FENTANYL CITRATE (PF) 100 MCG/2ML IJ SOLN
INTRAMUSCULAR | Status: AC
  Filled 2021-02-06: qty 2

## 2021-02-06 MED ORDER — SUGAMMADEX SODIUM 200 MG/2ML IV SOLN
INTRAVENOUS | Status: DC | PRN
  Administered 2021-02-06: 300 mg via INTRAVENOUS

## 2021-02-06 MED ORDER — TRAMADOL HCL 50 MG PO TABS
ORAL_TABLET | ORAL | Status: AC
  Filled 2021-02-06: qty 1

## 2021-02-06 MED ORDER — ACETAMINOPHEN 500 MG PO TABS: 1000.0000 mg | ORAL_TABLET | Freq: Four times a day (QID) | ORAL | Status: AC | PRN

## 2021-02-06 MED ORDER — PROPOFOL 10 MG/ML IV BOLUS
INTRAVENOUS | Status: DC | PRN
  Administered 2021-02-06: 120 mg via INTRAVENOUS

## 2021-02-06 MED ORDER — DROPERIDOL 2.5 MG/ML IJ SOLN
0.6250 mg | Freq: Once | INTRAMUSCULAR | Status: DC | PRN
  Filled 2021-02-06: qty 2

## 2021-02-06 MED ORDER — ORAL CARE MOUTH RINSE: 15.0000 mL | Freq: Once | OROMUCOSAL | Status: DC

## 2021-02-06 MED ORDER — ONDANSETRON HCL 4 MG/2ML IJ SOLN
INTRAMUSCULAR | Status: AC
  Filled 2021-02-06: qty 2

## 2021-02-06 MED ORDER — BUPIVACAINE-EPINEPHRINE (PF) 0.25% -1:200000 IJ SOLN
INTRAMUSCULAR | Status: AC
  Filled 2021-02-06: qty 30

## 2021-02-06 MED ORDER — ACETAMINOPHEN 10 MG/ML IV SOLN: 1000.0000 mg | Freq: Once | INTRAVENOUS | Status: DC | PRN

## 2021-02-06 MED ORDER — OXYCODONE HCL 5 MG PO TABS
ORAL_TABLET | ORAL | Status: AC
  Administered 2021-02-06: 5 mg via ORAL
  Filled 2021-02-06: qty 1

## 2021-02-06 MED ORDER — ONDANSETRON HCL 4 MG/2ML IJ SOLN
INTRAMUSCULAR | Status: DC | PRN
  Administered 2021-02-06: 4 mg via INTRAVENOUS

## 2021-02-06 SURGICAL SUPPLY — 50 items
ADH SKN CLS APL DERMABOND .7 (GAUZE/BANDAGES/DRESSINGS) ×1
APL PRP STRL LF DISP 70% ISPRP (MISCELLANEOUS) ×2
BAG SPEC RTRVL LRG 6X4 10 (ENDOMECHANICALS) ×1
CHLORAPREP W/TINT 26 (MISCELLANEOUS) ×4 IMPLANT
CLIP LIGATING HEMO O LOK GREEN (MISCELLANEOUS) ×2 IMPLANT
DEFOGGER SCOPE WARMER CLEARIFY (MISCELLANEOUS) ×2 IMPLANT
DERMABOND ADVANCED (GAUZE/BANDAGES/DRESSINGS) ×1
DERMABOND ADVANCED .7 DNX12 (GAUZE/BANDAGES/DRESSINGS) ×1 IMPLANT
DRAPE ARM DVNC X/XI (DISPOSABLE) ×4 IMPLANT
DRAPE COLUMN DVNC XI (DISPOSABLE) ×1 IMPLANT
DRAPE DA VINCI XI ARM (DISPOSABLE) ×8
DRAPE DA VINCI XI COLUMN (DISPOSABLE) ×2
ELECT CAUTERY BLADE TIP 2.5 (TIP) ×2
ELECT REM PT RETURN 9FT ADLT (ELECTROSURGICAL) ×2
ELECTRODE CAUTERY BLDE TIP 2.5 (TIP) ×1 IMPLANT
ELECTRODE REM PT RTRN 9FT ADLT (ELECTROSURGICAL) ×1 IMPLANT
GAUZE 4X4 16PLY ~~LOC~~+RFID DBL (SPONGE) ×2 IMPLANT
GLOVE SURG SYN 7.0 (GLOVE) ×4 IMPLANT
GLOVE SURG SYN 7.5  E (GLOVE) ×4
GLOVE SURG SYN 7.5 E (GLOVE) ×2 IMPLANT
GOWN STRL REUS W/ TWL LRG LVL3 (GOWN DISPOSABLE) ×4 IMPLANT
GOWN STRL REUS W/TWL LRG LVL3 (GOWN DISPOSABLE) ×8
IRRIGATION STRYKERFLOW (MISCELLANEOUS) ×1 IMPLANT
IRRIGATOR STRYKERFLOW (MISCELLANEOUS) ×2
IV NS 1000ML (IV SOLUTION) ×2
IV NS 1000ML BAXH (IV SOLUTION) ×1 IMPLANT
KIT PINK PAD W/HEAD ARE REST (MISCELLANEOUS) ×2
KIT PINK PAD W/HEAD ARM REST (MISCELLANEOUS) ×1 IMPLANT
LABEL OR SOLS (LABEL) ×2 IMPLANT
MANIFOLD NEPTUNE II (INSTRUMENTS) ×2 IMPLANT
NEEDLE HYPO 22GX1.5 SAFETY (NEEDLE) ×2 IMPLANT
NS IRRIG 500ML POUR BTL (IV SOLUTION) ×2 IMPLANT
OBTURATOR OPTICAL STANDARD 8MM (TROCAR) ×2
OBTURATOR OPTICAL STND 8 DVNC (TROCAR) ×1
OBTURATOR OPTICALSTD 8 DVNC (TROCAR) ×1 IMPLANT
PACK LAP CHOLECYSTECTOMY (MISCELLANEOUS) ×2 IMPLANT
PENCIL ELECTRO HAND CTR (MISCELLANEOUS) ×2 IMPLANT
POUCH SPECIMEN RETRIEVAL 10MM (ENDOMECHANICALS) ×2 IMPLANT
SEAL CANN UNIV 5-8 DVNC XI (MISCELLANEOUS) ×4 IMPLANT
SEAL XI 5MM-8MM UNIVERSAL (MISCELLANEOUS) ×8
SET TUBE SMOKE EVAC HIGH FLOW (TUBING) ×2 IMPLANT
SOLUTION ELECTROLUBE (MISCELLANEOUS) ×2 IMPLANT
SPONGE T-LAP 4X18 ~~LOC~~+RFID (SPONGE) ×2 IMPLANT
SUT MNCRL AB 4-0 PS2 18 (SUTURE) ×4 IMPLANT
SUT VIC AB 3-0 SH 27 (SUTURE) ×2
SUT VIC AB 3-0 SH 27X BRD (SUTURE) ×1 IMPLANT
SUT VICRYL 0 AB UR-6 (SUTURE) ×4 IMPLANT
TAPE TRANSPORE STRL 2 31045 (GAUZE/BANDAGES/DRESSINGS) ×2 IMPLANT
TROCAR Z-THREAD FIOS 11X100 BL (TROCAR) ×2 IMPLANT
WATER STERILE IRR 500ML POUR (IV SOLUTION) ×2 IMPLANT

## 2021-02-06 NOTE — Transfer of Care (Signed)
Immediate Anesthesia Transfer of Care Note  Patient: Anna Juarez  Procedure(s) Performed: XI ROBOTIC ASSISTED LAPAROSCOPIC CHOLECYSTECTOMY INDOCYANINE GREEN FLUORESCENCE IMAGING (ICG)  Patient Location: PACU  Anesthesia Type:General  Level of Consciousness: drowsy  Airway & Oxygen Therapy: Patient Spontanous Breathing and Patient connected to face mask oxygen  Post-op Assessment: Report given to RN and Post -op Vital signs reviewed and stable  Post vital signs: Reviewed  Last Vitals:  Vitals Value Taken Time  BP 117/75 02/06/21 1009  Temp    Pulse 76 02/06/21 1009  Resp 11 02/06/21 1009  SpO2 92 % 02/06/21 1009  Vitals shown include unvalidated device data.  Last Pain:  Vitals:   02/06/21 0649  TempSrc: Tympanic  PainSc: 0-No pain      Patients Stated Pain Goal: 0 (09/47/09 6283)  Complications: No notable events documented.

## 2021-02-06 NOTE — Anesthesia Preprocedure Evaluation (Addendum)
Anesthesia Evaluation  Patient identified by MRN, date of birth, ID band Patient awake    Reviewed: Allergy & Precautions, NPO status , Patient's Chart, lab work & pertinent test results  History of Anesthesia Complications (+) PONV and history of anesthetic complications  Airway Mallampati: II  TM Distance: >3 FB Neck ROM: Full    Dental  (+) Missing,    Pulmonary shortness of breath and with exertion, sleep apnea and Continuous Positive Airway Pressure Ventilation ,     + decreased breath sounds      Cardiovascular Exercise Tolerance: Poor METS: < 3 Mets hypertension, + DOE  Normal cardiovascular exam Rhythm:Regular Rate:Normal  Palpitations   Neuro/Psych  Neuromuscular disease (diabetic peripheral neuropathy) negative psych ROS   GI/Hepatic Fatty liver symptomatic cholelithiasis   Endo/Other  diabetes (a1c 7.4), Poorly Controlled, Type 2, Oral Hypoglycemic AgentsMorbid obesity  Renal/GU CRFRenal disease  negative genitourinary   Musculoskeletal  (+) Arthritis  (Radiculopathy, cervical, 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 ),   Abdominal (+) + obese,   Peds negative pediatric ROS (+)  Hematology Monoclonal gammopathy   Anesthesia Other Findings   Reproductive/Obstetrics negative OB ROS                            Anesthesia Physical Anesthesia Plan  ASA: 3  Anesthesia Plan: General   Post-op Pain Management:    Induction: Intravenous  PONV Risk Score and Plan: 4 or greater and Ondansetron, Dexamethasone, Aprepitant and Treatment may vary due to age or medical condition  Airway Management Planned: Oral ETT  Additional Equipment:   Intra-op Plan:   Post-operative Plan: Extubation in OR  Informed Consent: I have reviewed the patients History and Physical, chart, labs and discussed the procedure including the risks, benefits and alternatives for the proposed  anesthesia with the patient or authorized representative who has indicated his/her understanding and acceptance.     Dental advisory given  Plan Discussed with: CRNA and Anesthesiologist  Anesthesia Plan Comments:         Anesthesia Quick Evaluation

## 2021-02-06 NOTE — Progress Notes (Signed)
Pt began c/o lower sternal chest pain.  Respositioned pt in bed in case caused by CO2 from robotic machine. Pt does not feel better and states "I don't feel right".  Dr Andree Elk notified and EKG done. No changes from prior EKG when compared by dr Andree Elk.  Will continue to treat pain. No further orders.

## 2021-02-06 NOTE — Op Note (Signed)
  Procedure Date:  02/06/2021  Pre-operative Diagnosis:  Symptomatic cholelithiasis  Post-operative Diagnosis:  Symptomatic cholelithiasis  Procedure:  Robotic assisted cholecystectomy with ICG FireFly cholangiogram  Surgeon:  Melvyn Neth, MD  Assistant:  Douglass Rivers, PA-S  Anesthesia:  General endotracheal  Estimated Blood Loss:  10 ml  Specimens:  gallbladder  Complications:  None  Indications for Procedure:  This is a 70 y.o. female who presents with abdominal pain and workup revealing symptomatic cholelithiasis.  The benefits, complications, treatment options, and expected outcomes were discussed with the patient. The risks of bleeding, infection, recurrence of symptoms, failure to resolve symptoms, bile duct damage, bile duct leak, retained common bile duct stone, bowel injury, and need for further procedures were all discussed with the patient and she was willing to proceed.  Description of Procedure: The patient was correctly identified in the preoperative area and brought into the operating room.  The patient was placed supine with VTE prophylaxis in place.  Appropriate time-outs were performed.  Anesthesia was induced and the patient was intubated.  Appropriate antibiotics were infused.  The abdomen was prepped and draped in a sterile fashion. A supraumbilical incision was made. A cutdown technique was used to enter the abdominal cavity without injury, and a Hasson trocar was inserted.  Pneumoperitoneum was obtained with appropriate opening pressures.  Three 8-mm ports were placed in the mid abdomen at the level of the umbilicus under direct visualization.  An 8 mm port was placed through the Hasson trocar.  The DaVinci platform was docked, camera targeted, and instruments were placed under direct visualization.  The gallbladder was identified.  The fundus was grasped and retracted cephalad.  Adhesions were lysed bluntly and with electrocautery. The infundibulum was  grasped and retracted laterally, exposing the peritoneum overlying the gallbladder.  This was incised with electrocautery and extended on either side of the gallbladder.  FireFly cholangiogram was then obtained, and we were able to clearly identify the cystic duct and common bile duct.  The cystic duct and cystic artery were carefully dissected with combination of cautery and blunt dissection.  Both were clipped twice proximally and once distally, cutting in between.  The gallbladder was taken from the gallbladder fossa in a retrograde fashion with electrocautery. The gallbladder was placed in an Endocatch bag. The liver bed was inspected and any bleeding was controlled with electrocautery. The right upper quadrant was then inspected again revealing intact clips, no bleeding, and no ductal injury.  The 8 mm ports were removed under direct visualization and the 12 mm port was removed.  The Endocatch bag was brought out via the umbilical incision. The fascial opening was closed using 0 vicryl suture.  Local anesthetic was infused in all incisions.  The umbilical incision was closed using 3-0 Vicryl and 4-0 Monocryl, and the remaining incisions were closed with 4-0 Monocryl.  The wounds were cleaned and sealed with DermaBond.  The patient was emerged from anesthesia and extubated and brought to the recovery room for further management.  The patient tolerated the procedure well and all counts were correct at the end of the case.   Melvyn Neth, MD

## 2021-02-06 NOTE — Anesthesia Procedure Notes (Signed)
Procedure Name: Intubation Date/Time: 02/06/2021 7:59 AM Performed by: Rolla Plate, CRNA Pre-anesthesia Checklist: Patient identified, Patient being monitored, Timeout performed, Emergency Drugs available and Suction available Patient Re-evaluated:Patient Re-evaluated prior to induction Oxygen Delivery Method: Circle system utilized Preoxygenation: Pre-oxygenation with 100% oxygen Induction Type: IV induction and Rapid sequence Ventilation: Mask ventilation without difficulty Laryngoscope Size: Mac and 3 Grade View: Grade I Tube type: Oral Tube size: 7.0 mm Number of attempts: 1 Airway Equipment and Method: Stylet Placement Confirmation: ETT inserted through vocal cords under direct vision, positive ETCO2 and breath sounds checked- equal and bilateral Secured at: 21 cm Tube secured with: Tape Dental Injury: Teeth and Oropharynx as per pre-operative assessment

## 2021-02-06 NOTE — Interval H&P Note (Signed)
History and Physical Interval Note:  02/06/2021 7:10 AM  Anna Juarez  has presented today for surgery, with the diagnosis of symptomatic cholelithiasis.  The various methods of treatment have been discussed with the patient and family. After consideration of risks, benefits and other options for treatment, the patient has consented to  Procedure(s): XI ROBOTIC ASSISTED LAPAROSCOPIC CHOLECYSTECTOMY (N/A) [place] (ICG) (N/A) as a surgical intervention.  The patient's history has been reviewed, patient examined, no change in status, stable for surgery.  I have reviewed the patient's chart and labs.  Questions were answered to the patient's satisfaction.     Anakaren Campion

## 2021-02-06 NOTE — Discharge Instructions (Signed)

## 2021-02-07 ENCOUNTER — Other Ambulatory Visit: Payer: Self-pay | Admitting: Nurse Practitioner

## 2021-02-07 ENCOUNTER — Telehealth: Payer: Self-pay | Admitting: Nurse Practitioner

## 2021-02-07 LAB — SURGICAL PATHOLOGY

## 2021-02-07 NOTE — Telephone Encounter (Signed)
Copied from [place] [id number]. Topic: General - Other >> Feb 07, 2021  8:19 AM Alanda Slim E wrote: Reason for CRM: Pt has an appt this month on the 21st with [facility] and they need the MRI and any Xrays on her right hip/ they would like this on a disk/ pt will be around for other appts on 11.17.22 and 11.18.22/ she would like to pick this up on one of theses days/ please advise

## 2021-02-07 NOTE — Telephone Encounter (Signed)
Called and spoke with patient. Advised her that we have no way of printing images or reports on to a disk. Advised patient to contact the outpatient imaging center as they can print a disk for the patient. Provided the patient with the number to the imaging center.

## 2021-02-08 NOTE — Telephone Encounter (Signed)
Requested medications are due for refill today NO  Requested medications are on the active medication list NO  Last refill 12/27/2020  Notes to clinic Dose inconsistent with current med list. Requested Prescriptions  Pending Prescriptions Disp Refills   traZODone (DESYREL) 50 MG tablet [Pharmacy Med Name: TRAZODONE 50 MG TABLET] 30 tablet 0    Sig: TAKE 0.5-1 TABLETS BY MOUTH AT BEDTIME AS NEEDED FOR SLEEP.     Psychiatry: Antidepressants - Serotonin Modulator Passed - 02/07/2021  8:17 PM      Passed - Valid encounter within last 6 months    Recent Outpatient Visits           2 weeks ago Pre-op exam   [place], NP   3 weeks ago Chronic right-sided low back pain without sciatica   [facility] [name], NP   1 month ago Hypertension associated with diabetes [facility]   [facility] [name], NP   3 months ago Herpes zoster without complication   [facility] [name], NP   4 months ago Herpes zoster without complication   [place] Vigg, Avanti, MD       Future Appointments             In 1 week Leonie Man, MD [facility], [place]   In 1 month [name], NP [facility], [place]   In 6 months  [employer], [place]

## 2021-02-12 ENCOUNTER — Ambulatory Visit: Payer: Medicare PPO | Admitting: Gastroenterology

## 2021-02-13 ENCOUNTER — Encounter: Payer: Self-pay | Admitting: Gastroenterology

## 2021-02-13 NOTE — Anesthesia Postprocedure Evaluation (Signed)
Anesthesia Post Note  Patient: Anna Juarez  Procedure(s) Performed: XI ROBOTIC ASSISTED LAPAROSCOPIC CHOLECYSTECTOMY INDOCYANINE GREEN FLUORESCENCE IMAGING (ICG)  Patient location during evaluation: PACU Anesthesia Type: General Level of consciousness: awake and alert Pain management: pain level controlled Vital Signs Assessment: post-procedure vital signs reviewed and stable Respiratory status: spontaneous breathing, nonlabored ventilation, respiratory function stable and patient connected to nasal cannula oxygen Cardiovascular status: blood pressure returned to baseline and stable Postop Assessment: no apparent nausea or vomiting Anesthetic complications: no   No notable events documented.   Last Vitals:  Vitals:   02/06/21 1151 02/06/21 1222  BP: 121/70 133/65  Pulse: (!) 56 (!) 55  Resp: 16 18  Temp: (!) 36.1 C   SpO2: 97% 98%    Last Pain:  Vitals:   02/06/21 1222  TempSrc:   PainSc: [place] Sidrah Harden

## 2021-02-19 ENCOUNTER — Other Ambulatory Visit: Payer: Self-pay

## 2021-02-19 ENCOUNTER — Ambulatory Visit (INDEPENDENT_AMBULATORY_CARE_PROVIDER_SITE_OTHER): Payer: Medicare PPO | Admitting: Surgery

## 2021-02-19 ENCOUNTER — Encounter: Payer: Self-pay | Admitting: Surgery

## 2021-02-19 VITALS — BP 143/77 | HR 66 | Temp 98.3°F | Ht 62.0 in | Wt 295.0 lb

## 2021-02-19 DIAGNOSIS — K8020 Calculus of gallbladder without cholecystitis without obstruction: Secondary | ICD-10-CM

## 2021-02-19 DIAGNOSIS — Z09 Encounter for follow-up examination after completed treatment for conditions other than malignant neoplasm: Secondary | ICD-10-CM

## 2021-02-19 NOTE — Progress Notes (Signed)
02/19/2021  HPI: Anna Juarez is a 70 y.o. female s/p robotic assisted cholecystectomy on 02/06/21.  She presents for follow up.  She reports that she's been having issues with bladder spasms and cloudy malodorous urine.  She also reports having some nausea after eating and some discomfort in the RUQ as well.  She feels those issues are improving however.  She also reports that the incisions are irritated by her underwear or pants line.  Vital signs: BP (!) 143/77   Pulse 66   Temp 98.3 F (36.8 C)   Ht 5\' 2"  (1.575 m)   Wt 295 lb (133.8 kg)   SpO2 97%   BMI 53.96 kg/m    Physical Exam: Constitutional: No acute distress Abdomen:  soft, non-distended, currently non-tender to palpation.  Incisions are clean, dry, intact, healing well.  Assessment/Plan: This is a 70 y.o. female s/p robotic assisted cholecystectomy.  --Discussed with the patient that her nausea or discomfort could be related to her body still adjusting to not having a gallbladder and that these can continue to improve as they currently are.  For now, continue a low fat diet and avoiding greasy foods as her body adjusts. --With regards to her bladder symptoms, she could be having a UTI.  I offered to order a U/A today for her, but she reports that she's seeing her nephrologist tomorrow and will do it with his office. --Follow up in one month to assess her symptoms.   Melvyn Neth, [facility]

## 2021-02-19 NOTE — Patient Instructions (Addendum)
Have your kidney doctor run a urine test on you tomorrow to rule out any UTI problems.   It may take your body a few more weeks to adjust to not having a gallbladder and recovery from surgery.   You may try getting an abdominal binder to help with support.  You can look at [facility] for this. [address], [place], [place] [address], [phone]  Follow up in one month.   GENERAL POST-OPERATIVE PATIENT INSTRUCTIONS   WOUND CARE INSTRUCTIONS: Try to keep the wound dry and avoid ointments on the wound unless directed to do so.  If the wound becomes bright red and painful or starts to drain infected material that is not clear, please contact your physician immediately.  If the wound is mildly pink and has a thick firm ridge underneath it, this is normal, and is referred to as a healing ridge.  This will resolve over the next 4-6 weeks.  BATHING: You may shower if you have been informed of this by your surgeon. However, Please do not submerge in a tub, hot tub, or pool until incisions are completely sealed or have been told by your surgeon that you may do so.  DIET:  You may eat any foods that you can tolerate.  It is a good idea to eat a high fiber diet and take in plenty of fluids to prevent constipation.  If you do become constipated you may want to take a mild laxative or take ducolax tablets on a daily basis until your bowel habits are regular.  Constipation can be very uncomfortable, along with straining, after recent surgery.  ACTIVITY: You may want to hug a pillow when coughing and sneezing to add additional support to the surgical area, if you had abdominal or chest surgery, which will decrease pain during these times.  You are encouraged to walk and engage in light activity for the next two weeks.  You should not lift more than 20 pounds for 6 weeks after surgery as it could put you at increased risk for complications.  Twenty pounds is roughly equivalent to a  plastic bag of groceries. At that time- Listen to your body when lifting, if you have pain when lifting, stop and then try again in a few days. Soreness after doing exercises or activities of daily living is normal as you get back in to your normal routine.  MEDICATIONS:  Try to take narcotic medications and anti-inflammatory medications, such as tylenol, ibuprofen, naprosyn, etc., with food.  This will minimize stomach upset from the medication.  Should you develop nausea and vomiting from the pain medication, or develop a rash, please discontinue the medication and contact your physician.  You should not drive, make important decisions, or operate machinery when taking narcotic pain medication.  SUNBLOCK Use sun block to incision area over the next year if this area will be exposed to sun. This helps decrease scarring and will allow you avoid a permanent darkened area over your incision.  QUESTIONS:  Please feel free to call our office if you have any questions, and we will be glad to assist you.

## 2021-02-20 ENCOUNTER — Ambulatory Visit: Payer: Medicare PPO | Admitting: Cardiology

## 2021-02-21 ENCOUNTER — Encounter: Payer: Medicare PPO | Admitting: Physician Assistant

## 2021-02-25 ENCOUNTER — Ambulatory Visit: Payer: Self-pay

## 2021-02-25 NOTE — Telephone Encounter (Signed)
Pt calling in stating she has cloudy foul smelling urine and pain when urinating that has been going on for a week now. Says pain level 7-8 when urinating and almost wants to make her stop. Denies burning or fever. Appt scheduled for tomorrow at [place] by Agent. Care advice given and pt verbalized understanding. No other questions/concerns noted.     Reason for Disposition  Bad or foul-smelling urine  Answer Assessment - Initial Assessment Questions 1. SYMPTOM: "What's the main symptom you're concerned about?" (e.g., frequency, incontinence)     Cloudy urine, pain when urinating, frequency 2. ONSET: "When did the  symptoms  start?"     A week  3. PAIN: "Is there any pain?" If Yes, ask: "How bad is it?" (Scale: 1-10; mild, moderate, severe)     7-8 when urinating  4. CAUSE: "What do you think is causing the symptoms?"     UTI  5. OTHER SYMPTOMS: "Do you have any other symptoms?" (e.g., fever, flank pain, blood in urine, pain with urination)     Pain with urination  6. PREGNANCY: "Is there any chance you are pregnant?" "When was your last menstrual period?"     No  Protocols used: Urinary Symptoms-A-AH

## 2021-02-26 ENCOUNTER — Encounter: Payer: Self-pay | Admitting: Family Medicine

## 2021-02-26 ENCOUNTER — Ambulatory Visit: Payer: Medicare PPO | Admitting: Family Medicine

## 2021-02-26 ENCOUNTER — Other Ambulatory Visit: Payer: Self-pay

## 2021-02-26 VITALS — BP 96/65 | HR 53 | Temp 98.3°F | Wt 300.2 lb

## 2021-02-26 DIAGNOSIS — R300 Dysuria: Secondary | ICD-10-CM | POA: Diagnosis not present

## 2021-02-26 DIAGNOSIS — Z23 Encounter for immunization: Secondary | ICD-10-CM

## 2021-02-26 DIAGNOSIS — N3000 Acute cystitis without hematuria: Secondary | ICD-10-CM | POA: Diagnosis not present

## 2021-02-26 LAB — URINALYSIS, ROUTINE W REFLEX MICROSCOPIC
Bilirubin, UA: NEGATIVE
Ketones, UA: NEGATIVE
Leukocytes,UA: NEGATIVE
Nitrite, UA: NEGATIVE
Protein,UA: NEGATIVE
RBC, UA: NEGATIVE
Specific Gravity, UA: 1.015 (ref 1.005–1.030)
Urobilinogen, Ur: 0.2 mg/dL (ref 0.2–1.0)
pH, UA: 5.5 (ref 5.0–7.5)

## 2021-02-26 MED ORDER — CIPROFLOXACIN HCL 500 MG PO TABS: 500.0000 mg | ORAL_TABLET | Freq: Two times a day (BID) | ORAL | 0 refills | Status: AC

## 2021-02-26 NOTE — Progress Notes (Signed)
BP 96/65   Pulse (!) 53   Temp 98.3 F (36.8 C)   Wt (!) 300 lb 3.2 oz (136.2 kg)   SpO2 96%   BMI 54.91 kg/m    Subjective:    Patient ID: Anna Juarez, female    DOB: [birth year], 70 y.o.   MRN: [id number]  HPI: Anna Juarez is a 70 y.o. female  Chief Complaint  Patient presents with   Urinary Tract Infection    Patient states she has pain after urination    URINARY SYMPTOMS Duration: 3 weeks Dysuria: yes Urinary frequency: yes Urgency: yes Small volume voids: yes Symptom severity: moderate Urinary incontinence: yes Foul odor: yes Hematuria: no Abdominal pain: yes Back pain: no Suprapubic pain/pressure: yes Flank pain: no Fever:  no Vomiting: no Relief with cranberry juice: no Relief with pyridium: no Status: stable Previous urinary tract infection: yes History of sexually transmitted disease: no Vaginal discharge: no Treatments attempted: increasing fluids    Relevant past medical, surgical, family and social history reviewed and updated as indicated. Interim medical history since our last visit reviewed. Allergies and medications reviewed and updated.  Review of Systems  Constitutional: Negative.   Respiratory: Negative.    Cardiovascular: Negative.   Gastrointestinal: Negative.   Genitourinary:  Positive for frequency and urgency. Negative for decreased urine volume, enuresis, flank pain, genital sores, hematuria, menstrual problem, pelvic pain, vaginal bleeding, vaginal discharge and vaginal pain.  Musculoskeletal: Negative.   Psychiatric/Behavioral: Negative.     Per HPI unless specifically indicated above     Objective:    BP 96/65   Pulse (!) 53   Temp 98.3 F (36.8 C)   Wt (!) 300 lb 3.2 oz (136.2 kg)   SpO2 96%   BMI 54.91 kg/m   Wt Readings from Last 3 Encounters:  02/26/21 (!) 300 lb 3.2 oz (136.2 kg)  02/19/21 295 lb (133.8 kg)  02/06/21 299 lb 13.2 oz (136 kg)    Physical Exam Vitals and nursing note reviewed.   Constitutional:      General: She is not in acute distress.    Appearance: Normal appearance. She is not ill-appearing, toxic-appearing or diaphoretic.  HENT:     Head: Normocephalic and atraumatic.     Right Ear: External ear normal.     Left Ear: External ear normal.     Nose: Nose normal.     Mouth/Throat:     Mouth: Mucous membranes are moist.     Pharynx: Oropharynx is clear.  Eyes:     General: No scleral icterus.       Right eye: No discharge.        Left eye: No discharge.     Extraocular Movements: Extraocular movements intact.     Conjunctiva/sclera: Conjunctivae normal.     Pupils: Pupils are equal, round, and reactive to light.  Cardiovascular:     Rate and Rhythm: Normal rate and regular rhythm.     Pulses: Normal pulses.     Heart sounds: Normal heart sounds. No murmur heard.   No friction rub. No gallop.  Pulmonary:     Effort: Pulmonary effort is normal. No respiratory distress.     Breath sounds: Normal breath sounds. No stridor. No wheezing, rhonchi or rales.  Chest:     Chest wall: No tenderness.  Musculoskeletal:        General: Normal range of motion.     Cervical back: Normal range of motion and neck supple.  Skin:  General: Skin is warm and dry.     Capillary Refill: Capillary refill takes less than 2 seconds.     Coloration: Skin is not jaundiced or pale.     Findings: No bruising, erythema, lesion or rash.  Neurological:     General: No focal deficit present.     Mental Status: She is alert and oriented to person, place, and time. Mental status is at baseline.  Psychiatric:        Mood and Affect: Mood normal.        Behavior: Behavior normal.        Thought Content: Thought content normal.        Judgment: Judgment normal.    Results for orders placed or performed during the hospital encounter of 02/06/21  Glucose, capillary  Result Value Ref Range   Glucose-Capillary 146 (H) 70 - 99 mg/dL  Glucose, capillary  Result Value Ref Range    Glucose-Capillary 192 (H) 70 - 99 mg/dL  Surgical pathology  Result Value Ref Range   SURGICAL PATHOLOGY      SURGICAL PATHOLOGY CASE: [id number] PATIENT: Elliot Feinstein Surgical Pathology Report     Specimen Submitted: A. Gallbladder  Clinical History: Symptomatic cholelithiasis    DIAGNOSIS: A. GALLBLADDER; CHOLECYSTECTOMY: - CHRONIC CHOLECYSTITIS WITH CHOLELITHIASIS. - NEGATIVE FOR DYSPLASIA AND MALIGNANCY.  GROSS DESCRIPTION: A. Labeled: Gallbladder Received: Fresh Collection time: 9:29 AM on 02/06/2021 Placed into formalin time: 10:07 AM on 02/06/2021 Size of specimen: 8.9 x 3.5 x 3.3 cm Specimen integrity: Intact External surface: The serosa is pink and tan, smooth, and glistening with a roughened hepatic bed. Wall thickness: Ranges from 0.1-0.3 cm Mucosa: The mucosa is red-pink and velvety.  Cholesterolosis is absent. Cystic duct: The cystic duct is 1 x 0.7 x 0.3 cm.  The duct is patent and grossly unremarkable.  A distinct adjacent lymph node candidate is not grossly identified. Bile present: There is green-red viscous bile. Stones present: There is a s ingle black-brown, firm, and lobulated stone, 1.1 x 1 x 0.8 cm. Other findings: The wall is focally slightly thickened with overlying adipose tissue.  Block summary: 1 - cystic duct resection margin, en face and inked blue, with representative wall  RB 02/06/2021  Final Diagnosis performed by Allena Napoleon, MD.   Electronically signed 02/07/2021 10:19:50AM The electronic signature indicates that the named Attending Pathologist has evaluated the specimen Technical component performed at [facility], [address] [place], [place] [address] Lab: [id number] Dir: Rush Farmer, MD, MMM  Professional component performed at [facility], [facility], [place], [place],  [address] Lab: [id number] Dir: Kathi Simpers, MD       Assessment & Plan:   Problem List Items Addressed This  Visit   None Visit Diagnoses     Acute cystitis without hematuria    -  Primary   Glucosuria only. Likely irritation due to catheter in OR. Will treat. Call with any concerns or it not getting better.    Dysuria       Glucosuria only. Likely irritation due to catheter in OR. Will treat. Call with any concerns or it not getting better.    Relevant Orders   Urinalysis, Routine w reflex microscopic   Need for hepatitis A and B vaccination       Relevant Orders   Hepatitis B vaccine adult IM   Hepatitis A vaccine adult IM   Hepatitis B vaccine adult IM        Follow up plan:  Return As scheduled.

## 2021-03-03 ENCOUNTER — Other Ambulatory Visit: Payer: Self-pay | Admitting: Nurse Practitioner

## 2021-03-03 DIAGNOSIS — M545 Low back pain, unspecified: Secondary | ICD-10-CM

## 2021-03-03 DIAGNOSIS — G8929 Other chronic pain: Secondary | ICD-10-CM

## 2021-03-04 ENCOUNTER — Other Ambulatory Visit: Payer: Self-pay | Admitting: Nurse Practitioner

## 2021-03-04 NOTE — Telephone Encounter (Signed)
Requested medication (s) are due for refill today: yes  Requested medication (s) are on the active medication list: yes  Last refill:  01/13/21 #30 with 1 refill  Future visit scheduled: yes   Notes to clinic:  Please review for refill. Refill not delegated per protocol.     Requested Prescriptions  Pending Prescriptions Disp Refills   traMADol (ULTRAM) 50 MG tablet [Pharmacy Med Name: TRAMADOL HCL 50 MG TABLET] 30 tablet 1    Sig: TAKE 1 TABLET BY MOUTH EVERY 8 HOURS AS NEEDED.     Not Delegated - Analgesics:  Opioid Agonists Failed - 03/03/2021  3:23 PM      Failed - This refill cannot be delegated      Failed - Urine Drug Screen completed in last 360 days      Passed - Valid encounter within last 6 months    Recent Outpatient Visits           6 days ago Acute cystitis without hematuria   [place], [place], DO   1 month ago Pre-op exam   [place], NP   1 month ago Chronic right-sided low back pain without sciatica   [facility] [name], NP   2 months ago Hypertension associated with diabetes [facility])   [facility] [name], NP   4 months ago Herpes zoster without complication   [facility] [name], NP       Future Appointments             In 3 weeks [name], NP [facility], [place]   In 5 months  [employer], [place]

## 2021-03-05 NOTE — Telephone Encounter (Signed)
Requested medications are due for refill today.  unsure  Requested medications are on the active medications list.  yes  Last refill. 02/10/2021  Future visit scheduled.   yes  Notes to clinic.  Pt has both 50 mg and 100 mg dosage of this medication prescribed. Please advise.

## 2021-03-07 ENCOUNTER — Ambulatory Visit: Payer: Medicare PPO | Admitting: Cardiology

## 2021-03-08 ENCOUNTER — Other Ambulatory Visit: Payer: Self-pay | Admitting: Nurse Practitioner

## 2021-03-08 NOTE — Telephone Encounter (Signed)
Requested medication (s) are due for refill today: yes  Requested medication (s) are on the active medication list: yes  Last refill:  02/10/21 #30  Future visit scheduled: yes  Notes to clinic:  note attached to last refusal- unable to find this reorder   Disp Refills Start End   traZODone ([place]) 50 MG tablet [Pharmacy Med Name: TRAZODONE 50 MG TABLET] 90 tablet 1 03/07/2021    Request refused: Request already responded to by other means (e.g. phone or fax)   Sig: TAKE 1/2 TO 1 TABLET BY MOUTH AT BEDTIME AS NEEDED FOR SLEEP   Renewals  Renewal provider: [name], NP          Requested Prescriptions  Pending Prescriptions Disp Refills   traZODone (DESYREL) 50 MG tablet [Pharmacy Med Name: TRAZODONE 50 MG TABLET] 30 tablet 0    Sig: TAKE 1/2 TO 1 TABLET BY MOUTH AT BEDTIME AS NEEDED FOR SLEEP     Psychiatry: Antidepressants - Serotonin Modulator Passed - 03/08/2021  1:48 AM      Passed - Valid encounter within last 6 months    Recent Outpatient Visits           1 week ago Acute cystitis without hematuria   [place], Megan P, DO   1 month ago Pre-op exam   [place], NP   1 month ago Chronic right-sided low back pain without sciatica   [place], NP   2 months ago Hypertension associated with diabetes [facility])   [facility] [name], NP   4 months ago Herpes zoster without complication   [place], NP       Future Appointments             In 2 weeks [name], NP [facility], [place]   In 5 months  [employer], [place]

## 2021-03-19 ENCOUNTER — Telehealth: Payer: Self-pay

## 2021-03-19 NOTE — Chronic Care Management (AMB) (Signed)
Chronic Care Management Pharmacy Assistant   Name: SRINIKA DELONE  MRN: [id number] DOB: 1950-09-13  Reason for Encounter: Disease State General   Recent office visits:  02/26/21-Mega Annia Friendly, DO. Seen for urinary tract infection. 01/24/21-Karen Mathis Dad, NP (PCP) Seen for Pre-op exam. Labs ordered. EKG ordered. Flu vaccine given. 01/13/21-Karen Mathis Dad, NP (PCP) General follow up visit. Increase trazodone 100 mg. Follow up in 2 months. 12/27/20-Karen Mathis Dad, NP (PCP) General follow up visit. Labs ordered. Xray ordered. US abdomen ordered. Follow up in 2 weeks. 10/16/20-Karen Mathis Dad, NP (PCP) Follow up on shingles. Start Flexeril 5 mg. 10/09/20-Avanti Vigg, MD. Follow up visit. Start pt on medrol dose pak. Follow up in 1 week. 10/04/20-Lauren McElwee, NP. Seen for vaginitis. Start on fluconazole (DIFLUCAN) 150 MG tablet. 59/292/44-[id number] Mathis Dad, NP (PCP) General follow up visit. Labs ordered. Follow up in 6 months.  Recent consult visits:  02/20/21-[facility] (Nephrology) Follow up visit. Labs ordered. Follow up in 4 months. 02/19/21-Jose Piscoya, MD (General surgery) Routine post op. Follow up in 1 month. 01/31/21-Jose Piscoya, MD (General Surgery) Evaluation of abdominal pain. Follow up in 6 days. 01/21/21-Govina R. Brahmanday (Oncology) Seen for a consultation. Labs ordered. 01/20/21-Varnita B. Bonna Gains, MD (Gastroenterology) Seen for consultation. Referral to [facility] for evaluation. Follow up in 3 months. 10/17/20-[name] (Nephrology) Follow up visit. Labs ordered. Follow up in 4 months.  Hospital visits:  None in previous 6 months  Medications: Outpatient Encounter Medications as of 03/19/2021  Medication Sig   acetaminophen (TYLENOL) 500 MG tablet Take 1,000 mg by mouth daily.   acetaminophen (TYLENOL) 500 MG tablet Take 2 tablets (1,000 mg total) by mouth every 6 (six) hours as needed for mild pain.   benazepril (LOTENSIN) 40  MG tablet Take 1 tablet (40 mg total) by mouth daily. (Patient taking differently: Take 40 mg by mouth every morning.)   Blood Glucose Monitoring Suppl (ACCU-CHEK GUIDE) w/Device KIT Use daily to check glucose level.   dapagliflozin propanediol (FARXIGA) 10 MG TABS tablet Take 1 tablet (10 mg total) by mouth daily before breakfast.   glipiZIDE (GLUCOTROL) 5 MG tablet Take 1 tablet (5 mg total) by mouth 2 (two) times daily before a meal.   glucose blood test strip Use as instructed   Lactobacillus (ACIDOPHILUS) CAPS capsule Take 1 capsule by mouth daily. 175 mg   lovastatin (MEVACOR) 40 MG tablet TAKE 1 TABLET BY MOUTH EVERYDAY AT BEDTIME   metoprolol succinate (TOPROL-XL) 50 MG 24 hr tablet Take 1 tablet (50 mg total) by mouth daily. Take with or immediately following a meal. (Patient taking differently: Take 50 mg by mouth every morning. Take with or immediately following a meal.)   oxyCODONE (OXY IR/ROXICODONE) 5 MG immediate release tablet Take 1 tablet (5 mg total) by mouth every 4 (four) hours as needed for severe pain. (Patient not taking: Reported on 02/19/2021)   pantoprazole (PROTONIX) 20 MG tablet Take 1 tablet (20 mg total) by mouth daily. (Patient taking differently: Take 20 mg by mouth every morning.)   pregabalin (LYRICA) 150 MG capsule TAKE ONE CAPSULE BY MOUTH THREE TIMES DAILY   traMADol (ULTRAM) 50 MG tablet TAKE 1 TABLET BY MOUTH EVERY 8 HOURS AS NEEDED.   traZODone (DESYREL) 100 MG tablet Take 1 tablet (100 mg total) by mouth at bedtime as needed for sleep. (Patient taking differently: Take 100 mg by mouth at bedtime.)   traZODone (DESYREL) 50 MG tablet TAKE 0.5-1 TABLETS BY MOUTH AT BEDTIME AS NEEDED FOR SLEEP.  No facility-administered encounter medications on file as of 03/19/2021.   Have you had any problems recently with your health? Patient states she just had surgery and is currently recovering and is doing well with the recovery.  Have you had any problems with your  pharmacy? Patient states she has no problems with her pharmacy.  What issues or side effects are you having with your medications? Patient states she has no issues or side effects with any of her medications.  What would you like me to pass along to [facility] Nas[facility] Potts,CPP for them to help you with? Patient states there is nothing at this time.  What can we do to take care of you better? Patient states there is nothing at this time.   Care Gaps: Zoster Vaccines- Shingrix:Never done COLONOSCOPY:Last completed: Jun 12, 2009 COVID-19 Vaccine:Last completed: Feb 19, 2020 OPHTHALMOLOGY EXAM:Last completed: Apr 28, 2019  Star Rating Drugs: Benazepril 40 mg Last filled:01/31/21 90 DS Lovastatin 40 mg Last filled:01/27/21 90 DS Glipizide 5 mg Last filled:01/27/21 90 DS Farxiga 10 mg Last filled:08/06/20 15 DS  Myriam Elta [place], [place]

## 2021-03-21 ENCOUNTER — Encounter: Payer: Medicare PPO | Admitting: Surgery

## 2021-03-27 ENCOUNTER — Ambulatory Visit: Payer: Medicare PPO | Admitting: Nurse Practitioner

## 2021-03-27 ENCOUNTER — Other Ambulatory Visit: Payer: Self-pay

## 2021-03-27 ENCOUNTER — Encounter: Payer: Self-pay | Admitting: Nurse Practitioner

## 2021-03-27 VITALS — BP 122/73 | HR 59 | Temp 98.0°F | Wt 304.0 lb

## 2021-03-27 DIAGNOSIS — E1169 Type 2 diabetes mellitus with other specified complication: Secondary | ICD-10-CM

## 2021-03-27 DIAGNOSIS — E1159 Type 2 diabetes mellitus with other circulatory complications: Secondary | ICD-10-CM

## 2021-03-27 DIAGNOSIS — N1832 Chronic kidney disease, stage 3b: Secondary | ICD-10-CM

## 2021-03-27 DIAGNOSIS — I152 Hypertension secondary to endocrine disorders: Secondary | ICD-10-CM

## 2021-03-27 DIAGNOSIS — E1142 Type 2 diabetes mellitus with diabetic polyneuropathy: Secondary | ICD-10-CM | POA: Diagnosis not present

## 2021-03-27 DIAGNOSIS — Z23 Encounter for immunization: Secondary | ICD-10-CM

## 2021-03-27 DIAGNOSIS — R300 Dysuria: Secondary | ICD-10-CM

## 2021-03-27 DIAGNOSIS — E785 Hyperlipidemia, unspecified: Secondary | ICD-10-CM

## 2021-03-27 LAB — URINALYSIS, ROUTINE W REFLEX MICROSCOPIC
Bilirubin, UA: NEGATIVE
Ketones, UA: NEGATIVE
Leukocytes,UA: NEGATIVE
Nitrite, UA: NEGATIVE
Protein,UA: NEGATIVE
RBC, UA: NEGATIVE
Specific Gravity, UA: 1.015 (ref 1.005–1.030)
Urobilinogen, Ur: 0.2 mg/dL (ref 0.2–1.0)
pH, UA: 5.5 (ref 5.0–7.5)

## 2021-03-27 NOTE — Assessment & Plan Note (Signed)
Chronic.  Controlled.  Continue with current medication regimen of Benzapril 40mg daily.  Labs ordered today.  Return to clinic in 3 months for reevaluation.  Call sooner if concerns arise.   

## 2021-03-27 NOTE — Progress Notes (Signed)
BP 122/73    Pulse (!) 59    Temp 98 F (36.7 C) (Oral)    Wt (!) 304 lb (137.9 kg)    SpO2 98%    BMI 55.60 kg/m    Subjective:    Patient ID: Anna Juarez, female    DOB: 05-06-1950, 70 y.o.   MRN: [id number]  HPI: Anna Juarez is a 70 y.o. female  Chief Complaint  Patient presents with   Diabetes    Pt states she has her eye exam scheduled for January   Hyperlipidemia   Hypertension   DIABETES Hypoglycemic episodes:yes Polydipsia/polyuria: no Visual disturbance: no Chest pain: no Paresthesias: no Glucose Monitoring: yes  Accucheck frequency: Daily  Fasting glucose:   Post prandial:  Evening:  Before meals: Taking Insulin?: no  Long acting insulin:  Short acting insulin: Blood Pressure Monitoring: not checking Retinal Examination: Up to Date- scheduled for January Foot Exam: Not up to Date Diabetic Education: Not Completed Pneumovax: Up to Date Influenza: Not up to Date Aspirin: no  HYPERTENSION / HYPERLIPIDEMIA Satisfied with current treatment? no Duration of hypertension: years BP monitoring frequency: not checking BP range:  BP medication side effects: no Past BP meds: benazepril Duration of hyperlipidemia: years Cholesterol medication side effects: no Cholesterol supplements: none Past cholesterol medications: lovastatin Medication compliance: excellent compliance Aspirin: no Recent stressors: no Recurrent headaches: no Visual changes: no Palpitations: no Dyspnea: no Chest pain: no Lower extremity edema: no Dizzy/lightheaded: no  CHRONIC KIDNEY DISEASE CKD status: controlled Medications renally dose: yes Previous renal evaluation: yes Pneumovax:  Up to Date Influenza Vaccine:  Not up to Date  STOMACH PAIN Patient recently had a cholecystectomy.  She is slowly getting better.  Has followed up with surgeon who is happy with her progress.   GRIEF Patient states she is sleeping so much better.  She is taking the trazodone which is  helping her. She forgot it one night and realized she needed it.   HIP PAIN Patient states she is still having hip pain.  She has been trying to do stretches and being aware of moving around and that has helped some.    Patient states she has a painful sensation after using the bathroom.  States it feels like a pressure.  Doesn't hurt  Relevant past medical, surgical, family and social history reviewed and updated as indicated. Interim medical history since our last visit reviewed. Allergies and medications reviewed and updated.  Review of Systems  Eyes:  Negative for visual disturbance.  Respiratory:  Negative for chest tightness and shortness of breath.   Cardiovascular:  Negative for chest pain, palpitations and leg swelling.  Gastrointestinal:  Negative for abdominal pain.  Endocrine: Negative for polydipsia and polyuria.  Genitourinary:  Positive for dysuria.  Musculoskeletal:        Hip pain  Neurological:  Negative for dizziness, light-headedness, numbness and headaches.  Psychiatric/Behavioral:  Positive for dysphoric mood. Negative for sleep disturbance.    Per HPI unless specifically indicated above     Objective:    BP 122/73    Pulse (!) 59    Temp 98 F (36.7 C) (Oral)    Wt (!) 304 lb (137.9 kg)    SpO2 98%    BMI 55.60 kg/m   Wt Readings from Last 3 Encounters:  03/27/21 (!) 304 lb (137.9 kg)  02/26/21 (!) 300 lb 3.2 oz (136.2 kg)  02/19/21 295 lb (133.8 kg)    Physical Exam Vitals and nursing  note reviewed.  Constitutional:      General: She is not in acute distress.    Appearance: Normal appearance. She is obese. She is not ill-appearing, toxic-appearing or diaphoretic.  HENT:     Head: Normocephalic.     Right Ear: External ear normal.     Left Ear: External ear normal.     Nose: Nose normal.     Mouth/Throat:     Mouth: Mucous membranes are moist.     Pharynx: Oropharynx is clear.  Eyes:     General:        Right eye: No discharge.        Left eye:  No discharge.     Extraocular Movements: Extraocular movements intact.     Conjunctiva/sclera: Conjunctivae normal.     Pupils: Pupils are equal, round, and reactive to light.  Cardiovascular:     Rate and Rhythm: Normal rate and regular rhythm.     Heart sounds: No murmur heard. Pulmonary:     Effort: Pulmonary effort is normal. No respiratory distress.     Breath sounds: Normal breath sounds. No wheezing or rales.  Abdominal:     Tenderness: There is no abdominal tenderness. There is no right CVA tenderness or guarding. Negative signs include Murphy's sign.  Musculoskeletal:     Cervical back: Normal range of motion and neck supple.  Skin:    General: Skin is warm and dry.     Capillary Refill: Capillary refill takes less than 2 seconds.  Neurological:     General: No focal deficit present.     Mental Status: She is alert and oriented to person, place, and time. Mental status is at baseline.  Psychiatric:        Mood and Affect: Mood normal.        Behavior: Behavior normal.        Thought Content: Thought content normal.        Judgment: Judgment normal.    Results for orders placed or performed in visit on 02/26/21  Urinalysis, Routine w reflex microscopic  Result Value Ref Range   Specific Gravity, UA 1.015 1.005 - 1.030   pH, UA 5.5 5.0 - 7.5   Color, UA Yellow Yellow   Appearance Ur Cloudy (A) Clear   Leukocytes,UA Negative Negative   Protein,UA Negative Negative/Trace   Glucose, UA 3+ (A) Negative   Ketones, UA Negative Negative   RBC, UA Negative Negative   Bilirubin, UA Negative Negative   Urobilinogen, Ur 0.2 0.2 - 1.0 mg/dL   Nitrite, UA Negative Negative      Assessment & Plan:   Problem List Items Addressed This Visit       Cardiovascular and Mediastinum   Hypertension associated with diabetes ([place]) - Primary    Chronic.  Controlled.  Continue with current medication regimen of Benzapril 79m daily.  Labs ordered today.  Return to clinic in 3 months  for reevaluation.  Call sooner if concerns arise.        Relevant Orders   Comp Met (CMET)     Endocrine   DM type 2 with diabetic peripheral neuropathy (HCC)    Chronic.  Controlled.  A1c is 7.4.  Continue with current medication regimen on Farxiga 166mdaily and Glipizide 77m22m Labs ordered today.  Return to clinic in 3 months for reevaluation.  Call sooner if concerns arise.        Relevant Orders   Comp Met (CMET)   HgB A1c  Hyperlipidemia associated with type 2 diabetes mellitus (HCC)    Chronic.  Controlled.  Continue with current medication regimen on Lovastatin.  Labs ordered today.  Return to clinic in 3 months for reevaluation.  Call sooner if concerns arise.        Relevant Orders   Comp Met (CMET)   Lipid Profile     Genitourinary   CKD (chronic kidney disease) stage 3, GFR 30-59 ml/min (HCC)    Labs ordered today. Followed by Dr. Holley Raring. Will make recommendations based on lab results.      Other Visit Diagnoses     Dysuria       UA ordered today. Will treat if necessary. Referral placed for Urology. Will have evaluation of discomfort done before taking patient off [name].    Relevant Orders   Urinalysis, Routine w reflex microscopic   Ambulatory referral to Urology   Need for vaccination       Hep B given today. Will be due for 3rd hep B and hep a in 6 months.   Need for hepatitis A and B vaccination            Follow up plan: Return in about 3 months (around 06/25/2021) for HTN, HLD, DM2 FU.

## 2021-03-27 NOTE — Assessment & Plan Note (Signed)
Chronic.  Controlled.  Continue with current medication regimen on Lovastatin.  Labs ordered today.  Return to clinic in 3 months for reevaluation.  Call sooner if concerns arise.

## 2021-03-27 NOTE — Progress Notes (Signed)
Please let patient know that her urine does not show that she has an infection.  Let's see Urology and see if they are able to figure out the cause of patient's symptoms.

## 2021-03-27 NOTE — Assessment & Plan Note (Signed)
Chronic.  Controlled.  A1c is 7.4.  Continue with current medication regimen on Farxiga 10mg  daily and Glipizide 5mg .  Labs ordered today.  Return to clinic in 3 months for reevaluation.  Call sooner if concerns arise.

## 2021-03-27 NOTE — Assessment & Plan Note (Signed)
Labs ordered today. Followed by Dr. Holley Raring. Will make recommendations based on lab results.

## 2021-03-28 LAB — HEMOGLOBIN A1C
Est. average glucose Bld gHb Est-mCnc: 157 mg/dL
Hgb A1c MFr Bld: 7.1 % — ABNORMAL HIGH (ref 4.8–5.6)

## 2021-03-28 LAB — COMPREHENSIVE METABOLIC PANEL
ALT: 16 IU/L (ref 0–32)
AST: 32 IU/L (ref 0–40)
Albumin/Globulin Ratio: 1.6 (ref 1.2–2.2)
Albumin: 4.5 g/dL (ref 3.7–4.7)
Alkaline Phosphatase: 66 IU/L (ref 44–121)
BUN/Creatinine Ratio: 11 — ABNORMAL LOW (ref 12–28)
BUN: 16 mg/dL (ref 8–27)
Bilirubin Total: 0.4 mg/dL (ref 0.0–1.2)
CO2: 25 mmol/L (ref 20–29)
Calcium: 9.1 mg/dL (ref 8.7–10.3)
Chloride: 100 mmol/L (ref 96–106)
Creatinine, Ser: 1.46 mg/dL — ABNORMAL HIGH (ref 0.57–1.00)
Globulin, Total: 2.8 g/dL (ref 1.5–4.5)
Glucose: 181 mg/dL — ABNORMAL HIGH (ref 70–99)
Potassium: 4.4 mmol/L (ref 3.5–5.2)
Sodium: 141 mmol/L (ref 134–144)
Total Protein: 7.3 g/dL (ref 6.0–8.5)
eGFR: 38 mL/min/{1.73_m2} — ABNORMAL LOW (ref >59)

## 2021-03-28 LAB — LIPID PANEL
Chol/HDL Ratio: 2.9 ratio (ref 0.0–4.4)
Cholesterol, Total: 183 mg/dL (ref 100–199)
HDL: 63 mg/dL (ref >39)
LDL Chol Calc (NIH): 89 mg/dL (ref 0–99)
Triglycerides: 181 mg/dL — ABNORMAL HIGH (ref 0–149)
VLDL Cholesterol Cal: 31 mg/dL (ref 5–40)

## 2021-03-28 NOTE — Progress Notes (Signed)
Please let patient know that her lab work looks good.  No concerns at this time. Her a1c remains well controlled at 7.1.  Let's see Urology and see if they can help with the symptoms she is having.

## 2021-04-15 ENCOUNTER — Telehealth: Payer: Self-pay

## 2021-04-15 NOTE — Chronic Care Management (AMB) (Signed)
Chronic Care Management Pharmacy Assistant   Name: Anna Juarez  MRN: [id number] DOB: 1950-12-11  Reason for Encounter: Disease State Diabetes Mellitus   Recent office visits:  03/27/21-Karen Mathis Dad, NP (PCP) General follow up visit. Labs ordered. Ambulatory referral to Urology. Hep B vaccine given. Follow up in 3 months. 02/26/21-Mega Annia Friendly, DO. Seen for urinary tract infection. 01/24/21-Karen Mathis Dad, NP (PCP) Seen for Pre-op exam. Labs ordered. EKG ordered. Flu vaccine given. 01/13/21-Karen Mathis Dad, NP (PCP) General follow up visit. Increase trazodone 100 mg. Follow up in 2 months. 12/27/20-Karen Mathis Dad, NP (PCP) General follow up visit. Labs ordered. Xray ordered. US abdomen ordered. Follow up in 2 weeks. 10/16/20-Karen Mathis Dad, NP (PCP) Follow up on shingles. Start Flexeril 5 mg. 10/09/20-Avanti Vigg, MD. Follow up visit. Start pt on medrol dose pak. Follow up in 1 week. 10/04/20-Lauren McElwee, NP. Seen for vaginitis. Start on fluconazole (DIFLUCAN) 150 MG tablet. 14/388/87-NZVJK Mathis Dad, NP (PCP) General follow up visit. Labs ordered. Follow up in 6 months.  Recent consult visits:  02/20/21-[facility] (Nephrology) Follow up visit. Labs ordered. Follow up in 4 months. 02/19/21-Jose Piscoya, MD (General surgery) Routine post op. Follow up in 1 month. 01/31/21-Jose Piscoya, MD (General Surgery) Evaluation of abdominal pain. Follow up in 6 days. 01/21/21-Govina R. Brahmanday (Oncology) Seen for a consultation. Labs ordered. 01/20/21-Varnita B. Bonna Gains, MD (Gastroenterology) Seen for consultation. Referral to [facility] for evaluation. Follow up in 3 months. 10/17/20-Munsoor [name] (Nephrology) Follow up visit. Labs ordered. Follow up in 4 months  Hospital visits:  None in previous 6 months  Medications: Outpatient Encounter Medications as of 04/15/2021  Medication Sig   acetaminophen (TYLENOL) 500 MG tablet Take 2 tablets (1,000 mg  total) by mouth every 6 (six) hours as needed for mild pain.   benazepril (LOTENSIN) 40 MG tablet Take 1 tablet (40 mg total) by mouth daily. (Patient taking differently: Take 40 mg by mouth every morning.)   Blood Glucose Monitoring Suppl (ACCU-CHEK GUIDE) w/Device KIT Use daily to check glucose level.   dapagliflozin propanediol (FARXIGA) 10 MG TABS tablet Take 1 tablet (10 mg total) by mouth daily before breakfast.   glipiZIDE (GLUCOTROL) 5 MG tablet Take 1 tablet (5 mg total) by mouth 2 (two) times daily before a meal.   glucose blood test strip Use as instructed   lovastatin (MEVACOR) 40 MG tablet TAKE 1 TABLET BY MOUTH EVERYDAY AT BEDTIME   metoprolol succinate (TOPROL-XL) 50 MG 24 hr tablet Take 1 tablet (50 mg total) by mouth daily. Take with or immediately following a meal. (Patient taking differently: Take 50 mg by mouth every morning. Take with or immediately following a meal.)   pantoprazole (PROTONIX) 20 MG tablet Take 1 tablet (20 mg total) by mouth daily. (Patient taking differently: Take 20 mg by mouth every morning.)   pregabalin (LYRICA) 150 MG capsule TAKE ONE CAPSULE BY MOUTH THREE TIMES DAILY   traMADol (ULTRAM) 50 MG tablet TAKE 1 TABLET BY MOUTH EVERY 8 HOURS AS NEEDED.   traZODone (DESYREL) 100 MG tablet Take 1 tablet (100 mg total) by mouth at bedtime as needed for sleep. (Patient taking differently: Take 100 mg by mouth at bedtime.)   No facility-administered encounter medications on file as of 04/15/2021.   Current antihyperglycemic regimen:  Glipizide 5 mg take 1 tab twice daily Farxiga 10 mg take 1 tab daily Benazepril take 1 tab daily  What recent interventions/DTPs have been made to improve glycemic control:  None noted  Have there  been any recent hospitalizations or ED visits since last visit with CPP? No  Patient denies hypoglycemic symptoms, including Pale, Sweaty, Shaky, Hungry, Nervous/irritable, and Vision changes  Patient denies hyperglycemic symptoms,  including blurry vision, excessive thirst, fatigue, polyuria, and weakness  How often are you checking your blood sugar?         Once a week.  What are your blood sugars ranging?  Fasting:  Before meals:  After meals: 140-04/23/21 Bedtime:   During the week, how often does your blood glucose drop below 70? Patient states her blood sugar did drop a week before her gallbladder surgery, unsure of the blood glucose number. Patient states she has not had it dropped since then,  Are you checking your feet daily/regularly?  Patient states she does check her feet daily.  Scheduled follow up telephone appt with Clinical pharmacist on 05/26/21.  Adherence Review: Is the patient currently on a STATIN medication? Yes Is the patient currently on ACE/ARB medication? Yes Does the patient have >5 day gap between last estimated fill dates? No   Care Gaps: Zoster Vaccines- Shingrix:Never done COLONOSCOPY:Last completed: Jun 12, 2009 COVID-19 Vaccine:Last completed: Feb 19, 2020 OPHTHALMOLOGY EXAM:Last completed: Apr 28, 2019  Star Rating Drugs: Benazepril 40 mg Last filled:01/31/21 90 DS Lovastatin 40 mg Last filled:01/27/21 90 DS Glipizide 5 mg Last filled:01/27/21 90 DS Farxiga 10 mg Last filled:08/06/20 15 DS  Myriam Elta [place], [place]

## 2021-04-16 ENCOUNTER — Other Ambulatory Visit: Payer: Self-pay | Admitting: Nurse Practitioner

## 2021-04-16 NOTE — Telephone Encounter (Signed)
Trazodone dosage changed on 03/27/21-this is no longer appropriate dosage-refused/not needed. Protonix 20 mg tabs last ordered 01/27/21 #90 tabs with one refill that was confirmed by pharmacy as received-patient requesting refill too early-not needed at this time.

## 2021-04-18 LAB — HM DIABETES EYE EXAM

## 2021-04-28 ENCOUNTER — Other Ambulatory Visit: Payer: Self-pay | Admitting: Nurse Practitioner

## 2021-04-28 DIAGNOSIS — E785 Hyperlipidemia, unspecified: Secondary | ICD-10-CM

## 2021-04-28 DIAGNOSIS — I10 Essential (primary) hypertension: Secondary | ICD-10-CM

## 2021-04-29 NOTE — Telephone Encounter (Signed)
Requested Prescriptions  Pending Prescriptions Disp Refills   metoprolol succinate (TOPROL-XL) 50 MG 24 hr tablet [Pharmacy Med Name: metoprolol succinate ER 50 mg tablet,extended release 24 hr] 90 tablet 1    Sig: TAKE ONE TABLET BY MOUTH ONCE DAILY with A meal     Cardiovascular:  Beta Blockers Passed - 04/28/2021  4:06 PM      Passed - Last BP in normal range    BP Readings from Last 1 Encounters:  03/27/21 122/73         Passed - Last Heart Rate in normal range    Pulse Readings from Last 1 Encounters:  03/27/21 (!) 59         Passed - Valid encounter within last 6 months    Recent Outpatient Visits          1 month ago Hypertension associated with diabetes ([place])   [place], Karen, NP   2 months ago Acute cystitis without hematuria   [place], Megan P, DO   3 months ago Pre-op exam   [facility] [name], NP   3 months ago Chronic right-sided low back pain without sciatica   [facility] [name], NP   4 months ago Hypertension associated with diabetes ([place])   [facility] [name], NP      Future Appointments            In 2 weeks Hollice Espy, MD [place]   In 1 month [name], NP [place], [facility]   In 3 months  [place], [facility]            lovastatin (MEVACOR) 40 MG tablet [Pharmacy Med Name: lovastatin 40 mg tablet] 90 tablet 1    Sig: TAKE ONE TABLET BY MOUTH EVERYDAY AT BEDTIME     Cardiovascular:  Antilipid - Statins Failed - 04/28/2021  4:06 PM      Failed - Triglycerides in normal range and within 360 days    Triglycerides  Date Value Ref Range Status  03/27/2021 181 (H) 0 - 149 mg/dL Final   Triglycerides [place],Waived  Date Value Ref Range Status  03/21/2018 279 (H) <150 mg/dL Final    Comment:                            Normal                   <150                          Borderline High     150 - 199                         High                200 - 499                         Very High                >499          Passed - Total Cholesterol in normal range and within 360 days    Cholesterol, Total  Date Value Ref Range Status  03/27/2021 183 100 - 199 mg/dL Final   Cholesterol [place], [place]  Date Value Ref Range Status  03/21/2018 150 <200 mg/dL Final    Comment:                            Desirable                <200                         Borderline High      200- 239                         High                     >239          Passed - LDL in normal range and within 360 days    LDL Chol Calc (NIH)  Date Value Ref Range Status  03/27/2021 89 0 - 99 mg/dL Final         Passed - HDL in normal range and within 360 days    HDL  Date Value Ref Range Status  03/27/2021 63 >39 mg/dL Final         Passed - Patient is not pregnant      Passed - Valid encounter within last 12 months    Recent Outpatient Visits          1 month ago Hypertension associated with diabetes ([place])   [facility] [name], NP   2 months ago Acute cystitis without hematuria   [place], Megan P, DO   3 months ago Pre-op exam   [facility] [name], NP   3 months ago Chronic right-sided low back pain without sciatica   [facility] [name], NP   4 months ago Hypertension associated with diabetes ([place])   [facility] [name], NP      Future Appointments            In 2 weeks Hollice Espy, MD [place]   In 1 month [name], NP [place], [facility]   In 3 months  [place], [facility]            glipiZIDE (GLUCOTROL) 5 MG tablet [Pharmacy Med Name: glipizide 5 mg tablet] 180 tablet 1    Sig: TAKE ONE TABLET BY MOUTH TWICE DAILY BEFORE A meal     Endocrinology:  Diabetes -  Sulfonylureas Passed - 04/28/2021  4:06 PM      Passed - HBA1C is between 0 and 7.9 and within 180 days    Hemoglobin A1C  Date Value Ref Range Status  09/13/2018 8.0  Final   HB A1C (BAYER DCA - WAIVED)  Date Value Ref Range Status  12/27/2020 7.9 (H) 4.8 - 5.6 % Final    Comment:             Prediabetes: 5.7 - 6.4          Diabetes: >6.4          Glycemic control for adults with diabetes: <7.0               **Please note reference interval change**    Hgb A1c MFr Bld  Date Value Ref Range Status  03/27/2021 7.1 (H) 4.8 - 5.6 % Final    Comment:  Prediabetes: 5.7 - 6.4          Diabetes: >6.4          Glycemic control for adults with diabetes: <7.0          Passed - Valid encounter within last 6 months    Recent Outpatient Visits          1 month ago Hypertension associated with diabetes ([place])   [facility] [name], NP   2 months ago Acute cystitis without hematuria   [place], [place], DO   3 months ago Pre-op exam   [place], NP   3 months ago Chronic right-sided low back pain without sciatica   [facility] [name], NP   4 months ago Hypertension associated with diabetes [facility])   [facility] [name], NP      Future Appointments            In 2 weeks Hollice Espy, MD [place]   In 1 month [name], NP [facility], [place]   In 3 months  [facility], [place]

## 2021-05-01 ENCOUNTER — Ambulatory Visit: Payer: Medicare PPO | Admitting: Nurse Practitioner

## 2021-05-01 ENCOUNTER — Other Ambulatory Visit: Payer: Self-pay

## 2021-05-01 ENCOUNTER — Encounter: Payer: Self-pay | Admitting: Nurse Practitioner

## 2021-05-01 ENCOUNTER — Ambulatory Visit: Payer: Self-pay | Admitting: *Deleted

## 2021-05-01 VITALS — BP 101/63 | HR 65 | Temp 98.7°F | Ht 62.01 in | Wt 301.8 lb

## 2021-05-01 DIAGNOSIS — G8929 Other chronic pain: Secondary | ICD-10-CM

## 2021-05-01 DIAGNOSIS — N3000 Acute cystitis without hematuria: Secondary | ICD-10-CM | POA: Diagnosis not present

## 2021-05-01 DIAGNOSIS — R309 Painful micturition, unspecified: Secondary | ICD-10-CM | POA: Diagnosis not present

## 2021-05-01 DIAGNOSIS — M545 Low back pain, unspecified: Secondary | ICD-10-CM | POA: Diagnosis not present

## 2021-05-01 LAB — URINALYSIS, ROUTINE W REFLEX MICROSCOPIC
Bilirubin, UA: NEGATIVE
Ketones, UA: NEGATIVE
Leukocytes,UA: NEGATIVE
Nitrite, UA: NEGATIVE
Protein,UA: NEGATIVE
RBC, UA: NEGATIVE
Specific Gravity, UA: 1.01 (ref 1.005–1.030)
Urobilinogen, Ur: 0.2 mg/dL (ref 0.2–1.0)
pH, UA: 5.5 (ref 5.0–7.5)

## 2021-05-01 MED ORDER — CIPROFLOXACIN HCL 500 MG PO TABS: 500.0000 mg | ORAL_TABLET | Freq: Two times a day (BID) | ORAL | 0 refills | Status: AC

## 2021-05-01 MED ORDER — TRAMADOL HCL 50 MG PO TABS: 50.0000 mg | ORAL_TABLET | Freq: Three times a day (TID) | ORAL | 1 refills | Status: DC | PRN

## 2021-05-01 NOTE — Telephone Encounter (Signed)
Reason for Disposition  Age > 60 years    UTI symptoms  Answer Assessment - Initial Assessment Questions 1. LOCATION: "Where does it hurt?"      C/o abd pain at the bottom.   It hurts to pee and burning when I pee.   It's hard to get my stream started.   I'm having intermittent bad pains.   2. RADIATION: "Does the pain shoot anywhere else?" (e.g., chest, back)     No 3. ONSET: "When did the pain begin?" (e.g., minutes, hours or days ago)      First of the week and has gotten worse.   4. SUDDEN: "Gradual or sudden onset?"     *No Answer* 5. PATTERN "Does the pain come and go, or is it constant?"    - If constant: "Is it getting better, staying the same, or worsening?"      (Note: Constant means the pain never goes away completely; most serious pain is constant and it progresses)     - If intermittent: "How long does it last?" "Do you have pain now?"     (Note: Intermittent means the pain goes away completely between bouts)     *No Answer* 6. SEVERITY: "How bad is the pain?"  (e.g., Scale 1-10; mild, moderate, or severe)   - MILD (1-3): doesn't interfere with normal activities, abdomen soft and not tender to touch    - MODERATE (4-7): interferes with normal activities or awakens from sleep, abdomen tender to touch    - SEVERE (8-10): excruciating pain, doubled over, unable to do any normal activities      *No Answer* 7. RECURRENT SYMPTOM: "Have you ever had this type of stomach pain before?" If Yes, ask: "When was the last time?" and "What happened that time?"      *No Answer* 8. CAUSE: "What do you think is causing the stomach pain?"     Can I come in and give a urine sample and she call me in comething.    I have a urologist appt in 2 weeks.   9. RELIEVING/AGGRAVATING FACTORS: "What makes it better or worse?" (e.g., movement, antacids, bowel movement)     *No Answer* 10. OTHER SYMPTOMS: "Do you have any other symptoms?" (e.g., back pain, diarrhea, fever, urination pain, vomiting)        *No Answer* 11. PREGNANCY: "Is there any chance you are pregnant?" "When was your last menstrual period?"       *No Answer*  Protocols used: Abdominal Pain - [facility]

## 2021-05-01 NOTE — Progress Notes (Signed)
BP 101/63    Pulse 65    Temp 98.7 F (37.1 C) (Oral)    Ht 5' 2.01" (1.575 m)    Wt (!) 301 lb 12.8 oz (136.9 kg)    SpO2 96%    BMI 55.19 kg/m    Subjective:    Patient ID: Anna Juarez, female    DOB: September 14, 1950, 71 y.o.   MRN: [id number]  HPI: Anna Juarez is a 71 y.o. female  Chief Complaint  Patient presents with   Abdominal Pain    Patient states the pain is near her bladder, started on the first of the week. Painful urination   URINARY SYMPTOMS Dysuria: yes Urinary frequency: no Urgency: no Small volume voids: no Symptom severity: no Urinary incontinence: no Foul odor: no Hematuria: no Abdominal pain:  no Back pain: no Suprapubic pain/pressure: yes Flank pain: no Fever:  no Vomiting: no Relief with cranberry juice: no Relief with pyridium: no Status: worse Previous urinary tract infection: no Recurrent urinary tract infection: no  Relevant past medical, surgical, family and social history reviewed and updated as indicated. Interim medical history since our last visit reviewed. Allergies and medications reviewed and updated.  Review of Systems  Constitutional:  Negative for fever.  Gastrointestinal:  Negative for abdominal pain and vomiting.  Genitourinary:  Positive for dysuria and frequency. Negative for decreased urine volume, flank pain, hematuria and urgency.  Musculoskeletal:  Negative for back pain.   Per HPI unless specifically indicated above     Objective:    BP 101/63    Pulse 65    Temp 98.7 F (37.1 C) (Oral)    Ht 5' 2.01" (1.575 m)    Wt (!) 301 lb 12.8 oz (136.9 kg)    SpO2 96%    BMI 55.19 kg/m   Wt Readings from Last 3 Encounters:  05/01/21 (!) 301 lb 12.8 oz (136.9 kg)  03/27/21 (!) 304 lb (137.9 kg)  02/26/21 (!) 300 lb 3.2 oz (136.2 kg)    Physical Exam Vitals and nursing note reviewed.  Constitutional:      General: She is not in acute distress.    Appearance: Normal appearance. She is normal weight. She is not  ill-appearing, toxic-appearing or diaphoretic.  HENT:     Head: Normocephalic.     Right Ear: External ear normal.     Left Ear: External ear normal.     Nose: Nose normal.     Mouth/Throat:     Mouth: Mucous membranes are moist.     Pharynx: Oropharynx is clear.  Eyes:     General:        Right eye: No discharge.        Left eye: No discharge.     Extraocular Movements: Extraocular movements intact.     Conjunctiva/sclera: Conjunctivae normal.     Pupils: Pupils are equal, round, and reactive to light.  Cardiovascular:     Rate and Rhythm: Normal rate and regular rhythm.     Heart sounds: No murmur heard. Pulmonary:     Effort: Pulmonary effort is normal. No respiratory distress.     Breath sounds: Normal breath sounds. No wheezing or rales.  Abdominal:     General: Abdomen is flat. Bowel sounds are normal. There is no distension.     Palpations: Abdomen is soft.     Tenderness: There is abdominal tenderness. There is no right CVA tenderness, left CVA tenderness or guarding.  Musculoskeletal:  Cervical back: Normal range of motion and neck supple.  Skin:    General: Skin is warm and dry.     Capillary Refill: Capillary refill takes less than 2 seconds.  Neurological:     General: No focal deficit present.     Mental Status: She is alert and oriented to person, place, and time. Mental status is at baseline.  Psychiatric:        Mood and Affect: Mood normal.        Behavior: Behavior normal.        Thought Content: Thought content normal.        Judgment: Judgment normal.    Results for orders placed or performed in visit on 05/01/21  Urinalysis, Routine w reflex microscopic  Result Value Ref Range   Specific Gravity, UA 1.010 1.005 - 1.030   pH, UA 5.5 5.0 - 7.5   Color, UA Yellow Yellow   Appearance Ur Cloudy (A) Clear   Leukocytes,UA Negative Negative   Protein,UA Negative Negative/Trace   Glucose, UA 3+ (A) Negative   Ketones, UA Negative Negative   RBC, UA  Negative Negative   Bilirubin, UA Negative Negative   Urobilinogen, Ur 0.2 0.2 - 1.0 mg/dL   Nitrite, UA Negative Negative      Assessment & Plan:   Problem List Items Addressed This Visit       Other   Back pain   Relevant Medications   traMADol (ULTRAM) 50 MG tablet   Other Visit Diagnoses     Acute cystitis without hematuria    -  Primary   Will treat with Cipro. Since symptoms are ongoing, will see if Urology is able to see her sooner than February 10.  FU after patient's see's Urology.   Relevant Orders   Urine Culture   Painful urination       Relevant Orders   Urinalysis, Routine w reflex microscopic (Completed)        Follow up plan: Return if symptoms worsen or fail to improve.

## 2021-05-01 NOTE — Telephone Encounter (Signed)
°  Chief Complaint: lower abd pain, burning with urination, difficulty starting stream Symptoms: intermittent bad abd pains Frequency: Since first of the week Pertinent Negatives: Patient denies blood in urine Disposition: [] ED /[] Urgent Care (no appt availability in office) / [x] Appointment(In office/virtual)/ []  [place] [facility]/ [] Home Care/ [] Refused Recommended Disposition /[] [place] [facility]/ []  Follow-up with PCP Additional Notes: I called into the office and spoke with Iris who got pt scheduled for today at 1:00 in office visit.

## 2021-05-01 NOTE — Progress Notes (Signed)
Results discussed with patient during visit.

## 2021-05-15 NOTE — Progress Notes (Signed)
05/18/21 3:45 PM   Anna Juarez 1951/03/20 [id number]  Referring provider:  [name], NP [address] [place],  [place] [address] Chief Complaint  Patient presents with   New Patient (Initial Visit)   Dysuria     HPI: Anna Juarez is a 71 y.o.female who presents today for further evaluation of dysuria.   She was recently seen by her PCP, [name], NP, for abdominal pain near her bladder 05/01/2021. Urinalysis was unremarkable she was treated with Ciprofloxacin due to ongoing acue cystitis without hematuria symtpoms.   She is a diabetic, last hemoglobin A1c 7.7.  She has no documented UTIs or suspicious urinalyses.  UA today is negative other than for 3+ glucose.  Most recent upper tract imaging in the form of renal ultrasound on 01/2018 which shows normal kidneys bilaterally.  Today that her pain has been ongoing ever since her gallbladder surgery.  No number.  She had no pain prior to this.  The pain is situated in her suprapubic lower pannus area.  It hurts when she leans over particularly and when she presses the area.  It is unassociated with voiding.  She frankly denies dysuria today and often all of the occasions where she medicated for presumed UTI.  She does have some mild frequency which is baseline for her and unchanged.  She is not bothered by this.  She does see a dermatologist for what sounds like chronic candidal irritation with that this abdominal fold.  Bowels have been normal.  She does report that she sometimes has similar pain when she is trying to strain with a bowel movement.  She saw her surgeon who felt like pain was not surgically related.  The reason she thinks this is the bladder is because it situated over that anatomic area.  PMH: Past Medical History:  Diagnosis Date   Arthritis    Chronic kidney disease    Complication of anesthesia    Diabetes mellitus without complication ([place])    Extreme obesity    Fatty liver     GERD (gastroesophageal reflux disease)    Hyperlipidemia    Hypertension    Palpitations    PONV (postoperative nausea and vomiting)    Radiculopathy, cervical    Renal insufficiency    stage 3   Sleep apnea    uses cpap   Tear of acetabular labrum 01/2021   right    Surgical History: Past Surgical History:  Procedure Laterality Date   ABDOMINAL HYSTERECTOMY     CERVICAL FUSION     x2   CHOLECYSTECTOMY     COLONOSCOPY     JOINT REPLACEMENT Bilateral    knees    Home Medications:  Allergies as of 05/16/2021       Reactions   Actos [pioglitazone] Nausea And Vomiting   Metformin And Related Diarrhea   Scopolamine Nausea And Vomiting   Victoza [liraglutide] Nausea And Vomiting        Medication List        Accurate as of May 16, 2021 11:59 PM. If you have any questions, ask your nurse or doctor.          STOP taking these medications    Accu-Chek Guide w/Device Kit Stopped by: Hollice Espy, MD       TAKE these medications    acetaminophen 500 MG tablet Commonly known as: TYLENOL Take 2 tablets (1,000 mg total) by mouth every 6 (six) hours as needed for mild pain.  benazepril 40 MG tablet Commonly known as: LOTENSIN Take 1 tablet (40 mg total) by mouth daily.   dapagliflozin propanediol 10 MG Tabs tablet Commonly known as: Farxiga Take 1 tablet (10 mg total) by mouth daily before breakfast.   glipiZIDE 5 MG tablet Commonly known as: GLUCOTROL TAKE ONE TABLET BY MOUTH TWICE DAILY BEFORE A meal   glucose blood test strip Use as instructed   lovastatin 40 MG tablet Commonly known as: MEVACOR TAKE ONE TABLET BY MOUTH EVERYDAY AT BEDTIME   metoprolol succinate 50 MG 24 hr tablet Commonly known as: TOPROL-XL TAKE ONE TABLET BY MOUTH ONCE DAILY with A meal   pantoprazole 20 MG tablet Commonly known as: Protonix Take 1 tablet (20 mg total) by mouth daily.   pregabalin 150 MG capsule Commonly known as: LYRICA TAKE ONE CAPSULE BY  MOUTH THREE TIMES DAILY   traMADol 50 MG tablet Commonly known as: ULTRAM Take 1 tablet (50 mg total) by mouth every 8 (eight) hours as needed.   traZODone 100 MG tablet Commonly known as: DESYREL Take 1 tablet (100 mg total) by mouth at bedtime as needed for sleep.        Allergies:  Allergies  Allergen Reactions   Actos [Pioglitazone] Nausea And Vomiting   Metformin And Related Diarrhea   Scopolamine Nausea And Vomiting   Victoza [Liraglutide] Nausea And Vomiting    Family History: Family History  Problem Relation Age of Onset   Diabetes Mother    Heart disease Mother    Hypertension Mother    Heart disease Father    Cancer Brother    Hypertension Brother    Breast cancer Maternal Aunt    Breast cancer Paternal Aunt     Social History:  reports that she has never smoked. She has never been exposed to tobacco smoke. She has never used smokeless tobacco. She reports that she does not drink alcohol and does not use drugs.   Physical Exam: BP 133/68    Pulse (!) 59    Ht '5\' 2"'  (1.575 m)    Wt (!) 305 lb (138.3 kg)    BMI 55.79 kg/m   Constitutional:  Alert and oriented, No acute distress. HEENT: [place] AT, moist mucus membranes.  Trachea midline, no masses. Cardiovascular: No clubbing, cyanosis, or edema. Respiratory: Normal respiratory effort, no increased work of breathing.Marland Kitchen GI: Abdomen is soft.  There is suprapubic tenderness just inferior to her pannus.  Skin inspection upon lifting this area does show an erosion which started to bleed with retraction along with some chronic irritation.  See picture. Skin: No rashes, bruises or suspicious lesions. Neurologic: Grossly intact, no focal deficits, moving all 4 extremities. Psychiatric: Normal mood and affect.     Laboratory Data:  Lab Results  Component Value Date   CREATININE 1.46 (H) 03/27/2021   Lab Results  Component Value Date   HGBA1C 7.1 (H) 03/27/2021    Urinalysis Results for orders placed or  performed during the hospital encounter of 05/16/21  Urinalysis, Complete w Microscopic  Result Value Ref Range   Color, Urine YELLOW YELLOW   APPearance HAZY (A) CLEAR   Specific Gravity, Urine 1.020 1.005 - 1.030   pH 5.0 5.0 - 8.0   Glucose, UA >1,000 (A) NEGATIVE mg/dL   Hgb urine dipstick NEGATIVE NEGATIVE   Bilirubin Urine NEGATIVE NEGATIVE   Ketones, ur NEGATIVE NEGATIVE mg/dL   Protein, ur NEGATIVE NEGATIVE mg/dL   Nitrite NEGATIVE NEGATIVE   Leukocytes,Ua NEGATIVE NEGATIVE  Squamous Epithelial / LPF 6-10 0 - 5   WBC, UA 0-5 0 - 5 WBC/hpf   RBC / HPF 0-5 0 - 5 RBC/hpf   Bacteria, UA FEW (A) NONE SEEN     Pertinent Imaging: N/a  Assessment & Plan:    1. Suprapubic pain In the absence of urinary symptoms along with negative urinalysis, there is no indication that the pain that she is experiencing is related to her bladder  Over sure to follow back up with her PCP as well as surgeon as her pain began specifically following her surgery, may be related to adhesion or some other intra-abdominal process.  Will consider CT scan if it does not improve.  Additionally, she does have a skin erosion within this fold with very thin skin here.  She stated dermatologist in the past was previously using powders and creams to help keep this area dry without yeast infection.  Encouraged her to follow back up with dermatology for local skin care here.   Return if symptoms worsen or fail to improve.  [facility] [address], [place], [place] [address] [phone]

## 2021-05-16 ENCOUNTER — Other Ambulatory Visit
Admission: RE | Admit: 2021-05-16 | Discharge: 2021-05-16 | Disposition: A | Discharge status: Home / Self Care | Payer: Medicare PPO | Attending: Urology | Admitting: Urology

## 2021-05-16 ENCOUNTER — Ambulatory Visit: Payer: Medicare PPO | Admitting: Urology

## 2021-05-16 ENCOUNTER — Other Ambulatory Visit: Payer: Self-pay | Admitting: *Deleted

## 2021-05-16 ENCOUNTER — Encounter: Payer: Self-pay | Admitting: Urology

## 2021-05-16 ENCOUNTER — Other Ambulatory Visit: Payer: Self-pay

## 2021-05-16 VITALS — BP 133/68 | HR 59 | Ht 62.0 in | Wt 305.0 lb

## 2021-05-16 DIAGNOSIS — R102 Pelvic and perineal pain: Secondary | ICD-10-CM

## 2021-05-16 DIAGNOSIS — R300 Dysuria: Secondary | ICD-10-CM | POA: Insufficient documentation

## 2021-05-16 LAB — URINALYSIS, COMPLETE (UACMP) WITH MICROSCOPIC
Bilirubin Urine: NEGATIVE
Glucose, UA: >1000 mg/dL — AB
Hgb urine dipstick: NEGATIVE
Ketones, ur: NEGATIVE mg/dL
Leukocytes,Ua: NEGATIVE
Nitrite: NEGATIVE
Protein, ur: NEGATIVE mg/dL
Specific Gravity, Urine: 1.02 (ref 1.005–1.030)
pH: 5 (ref 5.0–8.0)

## 2021-05-22 ENCOUNTER — Encounter: Payer: Self-pay | Admitting: Ophthalmology

## 2021-05-22 NOTE — Progress Notes (Deleted)
Anna Juarez called pt.  BMI is 55 but pt is not wheelchair bound and does not require wheelchair assistance.  She states that she can walk from the parking lot to the building but she does get short of breath with long distances.

## 2021-05-26 ENCOUNTER — Ambulatory Visit (INDEPENDENT_AMBULATORY_CARE_PROVIDER_SITE_OTHER): Payer: Medicare PPO

## 2021-05-26 DIAGNOSIS — I152 Hypertension secondary to endocrine disorders: Secondary | ICD-10-CM

## 2021-05-26 DIAGNOSIS — E1142 Type 2 diabetes mellitus with diabetic polyneuropathy: Secondary | ICD-10-CM

## 2021-05-26 DIAGNOSIS — E1169 Type 2 diabetes mellitus with other specified complication: Secondary | ICD-10-CM

## 2021-05-26 DIAGNOSIS — E1159 Type 2 diabetes mellitus with other circulatory complications: Secondary | ICD-10-CM

## 2021-05-26 NOTE — Patient Instructions (Addendum)
Ms. Ciaramitaro,  Thank you for talking with me today. I have included our care plan/goals in the following pages. Please take a look at the gallbladder eating plan that we discussed. This is attached here.   Please review and call me at [phone] with any questions.  Thanks! Ellin Mayhew, PharmD Clinical Pharmacist  [phone]  Care Plan : [place]  Updates made by Madelin Rear, [facility] since 05/26/2021 12:00 AM     Problem: Dm, HTN, HLD, GERD, Obesity   Priority: High  Onset Date: 05/22/2020     Long-Range Goal: Disease management   Start Date: 05/26/2021  Recent Progress: On track  Priority: High  Note:   Hypertension (BP goal <130/80) -Controlled -Current treatment: Metoprolol succinate 50 mg once daily Benazepril 40 mg once daily -Medications previously tried: n/a  -Current home readings: none provided -Current dietary habits: lower salt, hard time eating in general d/t n/v. Some improvement following gall bladder removal. Trying to eat smaller portions more often. Gallbladder eating plan handout provided.  -Current exercise habits: has not been the most active, managing finances/accounts following husband passing away June of 2022.  -Denies hypotensive/hypertensive symptoms -Educated on BP goals and benefits of medications for prevention of heart attack, stroke and kidney damage; -Counseled to monitor BP at home 1-2x/wk, document, and provide log at future appointments -Counseled on diet and exercise extensively Recommended to continue current medication  Hyperlipidemia: (LDL goal < 70) -Controlled, at 70 03/2021 -Current treatment: Lovastatin 40 mg once daily  -Medications previously tried:   -Current dietary patterns: see HTN -Current exercise habits: see HTN -Educated on Cholesterol goals;  Benefits of statin for ASCVD risk reduction; -Counseled on diet and exercise extensively Recommended to continue current medication  Diabetes (A1c  goal <7%) -Not ideally controlled -Current medications: Glipizide 5 mg twice daily -Medications previously tried: Victoza  -Current home glucose readings fasting glucose: 140s is typical post prandial glucose: n/a -Denies hypoglycemic/hyperglycemic symptoms -Current meal patterns: see HTN -Current exercise: see htn -Educated on A1c and blood sugar goals; Carbohydrate counting and/or plate method -Counseled to check feet daily and get yearly eye exams -Counseled on diet and exercise extensively Recommended to continue current medication Assessed patient finances. Would likely qualify for Ozempic PAP d/t $2500/mo annual income.   Medication Assistance: Farxigaobtained through az&me medication assistance program.  Enrollment ends 03/2022. Offered support with ozempic if wanting to pursue at 06/2021 PCP visit.      The patient verbalized understanding of instructions provided today and agreed to receive a MyChart copy of patient instruction and/or educational materials. Telephone follow up appointment with pharmacy team member scheduled for: See next appointment with "Care Management Staff" under "What's Next" below.

## 2021-05-26 NOTE — Progress Notes (Signed)
Chronic Care Management Pharmacy Note  05/26/2021 Name:  Anna Juarez MRN:  [id number] DOB:  [birth year]  Summary: -typically 140s FBG -offered support on ozempic PAP - wants to think over and plans to review with PCP 06/3021 visit. Should qualify based on financial review. Reviewed diet - gallbladder eating plan provided to pt   Subjective: Anna Juarez is an 71 y.o. year old female who is a primary patient of Anna Billings, NP.  The CCM team was consulted for assistance with disease management and care coordination needs.    Engaged with patient by telephone for follow up visit in response to provider referral for pharmacy case management and/or care coordination services.   Consent to Services:  The patient was given information about Chronic Care Management services, agreed to services, and gave verbal consent prior to initiation of services.  Please see initial visit note for detailed documentation.   Patient Care Team: Anna Billings, NP as PCP - [name], MD as Consulting Physician (Gastroenterology) [name], MD as Consulting Physician (Orthopedic Surgery) Madelin Rear, [facility] (Pharmacist)  Hospital visits: None in previous 6 months  Objective:  Lab Results  Component Value Date   CREATININE 1.46 (H) 03/27/2021   CREATININE 1.36 (H) 01/24/2021   CREATININE 1.40 (H) 01/21/2021    Lab Results  Component Value Date   HGBA1C 7.1 (H) 03/27/2021   Last diabetic Eye exam:  Lab Results  Component Value Date/Time   HMDIABEYEEXA No Retinopathy 04/18/2021 12:00 AM    Last diabetic Foot exam: No results found for: HMDIABFOOTEX      Component Value Date/Time   CHOL 183 03/27/2021 1425   CHOL 150 03/21/2018 1332   TRIG 181 (H) 03/27/2021 1425   TRIG 279 (H) 03/21/2018 1332   HDL 63 03/27/2021 1425   CHOLHDL 2.9 03/27/2021 1425   VLDL 56 (H) 03/21/2018 1332   LDLCALC 89 03/27/2021 1425    Hepatic Function Latest Ref Rng & Units 03/27/2021  01/24/2021 01/21/2021  Total Protein 6.0 - 8.5 g/dL 7.3 7.1 7.4  Albumin 3.7 - 4.7 g/dL 4.5 4.4 4.0  AST 0 - 40 IU/L 32 26 25  ALT 0 - 32 IU/L '16 16 18  ' Alk Phosphatase 44 - 121 IU/L 66 55 44  Total Bilirubin 0.0 - 1.2 mg/dL 0.4 0.4 0.8    Lab Results  Component Value Date/Time   TSH 2.940 05/17/2020 01:37 PM   TSH 2.730 01/03/2018 03:37 PM    CBC Latest Ref Rng & Units 01/24/2021 01/21/2021 05/17/2020  WBC 3.4 - 10.8 x10E3/uL 6.5 5.8 5.8  Hemoglobin 11.1 - 15.9 g/dL 13.8 14.2 13.6  Hematocrit 34.0 - 46.6 % 40.9 43.3 42.0  Platelets 150 - 450 x10E3/uL 156 143(L) 179    No results found for: VD25OH  Clinical ASCVD:  The 10-year ASCVD risk score (Arnett DK, et al., 2019) is: 28.9%   Values used to calculate the score:     Age: 60 years     Sex: Female     Is Non-Hispanic African American: No     Diabetic: Yes     Tobacco smoker: No     Systolic Blood Pressure: 889 mmHg     Is BP treated: Yes     HDL Cholesterol: 63 mg/dL     Total Cholesterol: 183 mg/dL    Social History   Tobacco Use  Smoking Status Never   Passive exposure: Never  Smokeless Tobacco Never   BP Readings from Last  3 Encounters:  05/16/21 133/68  05/01/21 101/63  03/27/21 122/73   Pulse Readings from Last 3 Encounters:  05/16/21 (!) 59  05/01/21 65  03/27/21 (!) 59   Wt Readings from Last 3 Encounters:  05/16/21 (!) 305 lb (138.3 kg)  05/01/21 (!) 301 lb 12.8 oz (136.9 kg)  03/27/21 (!) 304 lb (137.9 kg)    Assessment: Review of patient past medical history, allergies, medications, health status, including review of consultants reports, laboratory and other test data, was performed as part of comprehensive evaluation and provision of chronic care management services.   SDOH:  (Social Determinants of Health) assessments and interventions performed:    CCM Care Plan  Allergies  Allergen Reactions   Actos [Pioglitazone] Nausea And Vomiting   Metformin And Related Diarrhea   Scopolamine  Nausea And Vomiting   Victoza [Liraglutide] Nausea And Vomiting    Medications Reviewed Today     Reviewed by Madelin Rear, [facility] (Pharmacist) on 05/26/21 at 1153  Med List Status: <None>   Medication Order Taking? Sig Documenting Provider Last Dose Status Informant  acetaminophen (TYLENOL) 500 MG tablet [id number] No Take 2 tablets (1,000 mg total) by mouth every 6 (six) hours as needed for mild pain. Olean Ree, MD Taking Active   Ascorbic Acid (VITAMIN C) 1000 MG tablet [id number]  Take 1,000 mg by mouth daily. [provider]  Active   benazepril (LOTENSIN) 40 MG tablet [id number] No Take 1 tablet (40 mg total) by mouth daily. Anna Billings, NP Taking Active   dapagliflozin propanediol (FARXIGA) 10 MG TABS tablet [id number] No Take 1 tablet (10 mg total) by mouth daily before breakfast. Anna Billings, NP Taking Active Self  glipiZIDE (GLUCOTROL) 5 MG tablet [id number] No TAKE ONE TABLET BY MOUTH TWICE DAILY BEFORE A meal Anna Billings, NP Taking Active   glucose blood test strip [id number] No Use as instructed Anna Billings, NP Taking Active Self  lovastatin (MEVACOR) 40 MG tablet [id number] No TAKE ONE TABLET BY MOUTH EVERYDAY AT BEDTIME Anna Billings, NP Taking Active   metoprolol succinate (TOPROL-XL) 50 MG 24 hr tablet [id number] No TAKE ONE TABLET BY MOUTH ONCE DAILY with A meal Anna Billings, NP Taking Active   pantoprazole (PROTONIX) 20 MG tablet [id number] No Take 1 tablet (20 mg total) by mouth daily. Anna Billings, NP Taking Active   pregabalin (LYRICA) 150 MG capsule [id number] No TAKE ONE CAPSULE BY MOUTH THREE TIMES DAILY Anna Billings, NP Taking Active Self  traMADol (ULTRAM) 50 MG tablet [id number] No Take 1 tablet (50 mg total) by mouth every 8 (eight) hours as needed. Anna Billings, NP Taking Active   traZODone (DESYREL) 100 MG tablet [id number] No Take 1 tablet (100 mg total) by mouth at bedtime as needed for sleep. Anna Billings, NP  Taking Active Self            Patient Active Problem List   Diagnosis Date Noted   Symptomatic cholelithiasis    Diabetes mellitus without complication ([place])    Monoclonal gammopathy 02/20/2020   MGUS (monoclonal gammopathy of unknown significance) 02/08/2018   CKD (chronic kidney disease) stage 3, GFR 30-59 ml/min (HCC) 01/04/2018   Advanced care planning/counseling discussion 11/24/2016   Back pain 07/22/2016   BMI 50.0-59.9, adult ([place]) 10/02/2014   DM type 2 with diabetic peripheral neuropathy ([place])    Hyperlipidemia associated with type 2 diabetes mellitus ([place])    Hypertension associated with diabetes ([place])     Immunization History  Administered Date(s) Administered  Fluad Quad(high Dose 65+) 01/24/2021   Hepatitis A, Adult 02/26/2021   Hepatitis B, adult 02/26/2021, 03/27/2021   Influenza, High Dose Seasonal PF 01/07/2016, 01/19/2017, 01/03/2018, 01/13/2019   Influenza,inj,Quad PF,6+ Mos 01/02/2015   Influenza-Unspecified 02/28/2014, 01/19/2017, 02/05/2020   Moderna Sars-Covid-2 Vaccination 04/21/2019, 05/19/2019, 02/19/2020   Pneumococcal Conjugate-13 02/28/2014   Pneumococcal Polysaccharide-23 10/31/2015   Pneumococcal-Unspecified 11/14/2004   Tdap 09/13/2013    Conditions to be addressed/monitored: HTN, HLD, DMII, and CKD Stage III  Care Plan : [place]  Updates made by Madelin Rear, [facility] since 05/26/2021 12:00 AM     Problem: Dm, HTN, HLD, GERD, Obesity   Priority: High  Onset Date: 05/22/2020     Long-Range Goal: Disease management   Start Date: 05/26/2021  Recent Progress: On track  Priority: High  Note:   Hypertension (BP goal <130/80) -Controlled -Current treatment: Metoprolol succinate 50 mg once daily Benazepril 40 mg once daily -Medications previously tried: n/a  -Current home readings: none provided -Current dietary habits: lower salt, hard time eating in general d/t n/v. Some improvement following gall bladder removal.  Trying to eat smaller portions more often. Gallbladder eating plan handout provided.  -Current exercise habits: has not been the most active, managing finances/accounts following husband passing away June of 2022.  -Denies hypotensive/hypertensive symptoms -Educated on BP goals and benefits of medications for prevention of heart attack, stroke and kidney damage; -Counseled to monitor BP at home 1-2x/wk, document, and provide log at future appointments -Counseled on diet and exercise extensively Recommended to continue current medication  Hyperlipidemia: (LDL goal < 70) -Controlled, at 70 03/2021 -Current treatment: Lovastatin 40 mg once daily  -Medications previously tried:   -Current dietary patterns: see HTN -Current exercise habits: see HTN -Educated on Cholesterol goals;  Benefits of statin for ASCVD risk reduction; -Counseled on diet and exercise extensively Recommended to continue current medication  Diabetes (A1c goal <7%) -Not ideally controlled -Current medications: Glipizide 5 mg twice daily -Medications previously tried: Victoza  -Current home glucose readings fasting glucose: 140s is typical post prandial glucose: n/a -Denies hypoglycemic/hyperglycemic symptoms -Current meal patterns: see HTN -Current exercise: see htn -Educated on A1c and blood sugar goals; Carbohydrate counting and/or plate method -Counseled to check feet daily and get yearly eye exams -Counseled on diet and exercise extensively Recommended to continue current medication Assessed patient finances. Would likely qualify for Ozempic PAP d/t $2500/mo annual income.   Medication Assistance: Farxigaobtained through az&me medication assistance program.  Enrollment ends 03/2022. Offered support with ozempic if wanting to pursue at 06/2021 PCP visit.       Patient's preferred pharmacy is:  [id number] [place] [place]Phone: [phone]:  [phone] [facility] [place] [address] [place]Phone: [phone]: [phone] Pt endorses 100% compliance  Follow Up:  Patient agrees to Care Plan and Follow-up.  Plan: Telephone follow up appointment with care management team member scheduled for:  3 m tel pharmacist  JMadelin Rear PharmD, BD. W. [facility] Memorial HospitalClinical Pharmacist  [place] ([phone]

## 2021-05-29 NOTE — Discharge Instructions (Signed)

## 2021-06-02 ENCOUNTER — Encounter: Payer: Self-pay | Admitting: Ophthalmology

## 2021-06-02 ENCOUNTER — Ambulatory Visit
Admission: RE | Admit: 2021-06-02 | Discharge: 2021-06-02 | Disposition: A | Discharge status: Home / Self Care | Payer: Medicare PPO | Source: Ambulatory Visit | Attending: Ophthalmology | Admitting: Ophthalmology

## 2021-06-02 ENCOUNTER — Encounter
Admission: RE | Disposition: A | Discharge status: Home / Self Care | Payer: Self-pay | Source: Ambulatory Visit | Attending: Ophthalmology

## 2021-06-02 ENCOUNTER — Ambulatory Visit: Payer: Medicare PPO | Admitting: Anesthesiology

## 2021-06-02 ENCOUNTER — Other Ambulatory Visit: Payer: Self-pay

## 2021-06-02 DIAGNOSIS — G4730 Sleep apnea, unspecified: Secondary | ICD-10-CM | POA: Diagnosis not present

## 2021-06-02 DIAGNOSIS — E1136 Type 2 diabetes mellitus with diabetic cataract: Secondary | ICD-10-CM | POA: Diagnosis present

## 2021-06-02 DIAGNOSIS — K760 Fatty (change of) liver, not elsewhere classified: Secondary | ICD-10-CM | POA: Diagnosis not present

## 2021-06-02 DIAGNOSIS — N183 Chronic kidney disease, stage 3 unspecified: Secondary | ICD-10-CM | POA: Insufficient documentation

## 2021-06-02 DIAGNOSIS — K219 Gastro-esophageal reflux disease without esophagitis: Secondary | ICD-10-CM | POA: Insufficient documentation

## 2021-06-02 DIAGNOSIS — H2511 Age-related nuclear cataract, right eye: Secondary | ICD-10-CM | POA: Diagnosis not present

## 2021-06-02 DIAGNOSIS — E669 Obesity, unspecified: Secondary | ICD-10-CM | POA: Diagnosis not present

## 2021-06-02 DIAGNOSIS — Z6841 Body Mass Index (BMI) 40.0 and over, adult: Secondary | ICD-10-CM | POA: Diagnosis not present

## 2021-06-02 DIAGNOSIS — E1122 Type 2 diabetes mellitus with diabetic chronic kidney disease: Secondary | ICD-10-CM | POA: Diagnosis present

## 2021-06-02 DIAGNOSIS — I129 Hypertensive chronic kidney disease with stage 1 through stage 4 chronic kidney disease, or unspecified chronic kidney disease: Secondary | ICD-10-CM | POA: Insufficient documentation

## 2021-06-02 HISTORY — PX: CATARACT EXTRACTION W/PHACO: SHX586

## 2021-06-02 LAB — GLUCOSE, CAPILLARY
Glucose-Capillary: 166 mg/dL — ABNORMAL HIGH (ref 70–99)
Glucose-Capillary: 149 mg/dL — ABNORMAL HIGH (ref 70–99)

## 2021-06-02 SURGERY — PHACOEMULSIFICATION, CATARACT, WITH IOL INSERTION
Anesthesia: Monitor Anesthesia Care | Site: Eye | Laterality: Right

## 2021-06-02 MED ORDER — SIGHTPATH DOSE#1 BSS IO SOLN
INTRAOCULAR | Status: DC | PRN
  Administered 2021-06-02: 15 mL

## 2021-06-02 MED ORDER — LACTATED RINGERS IV SOLN: INTRAVENOUS | Status: DC

## 2021-06-02 MED ORDER — FENTANYL CITRATE (PF) 100 MCG/2ML IJ SOLN
INTRAMUSCULAR | Status: DC | PRN
  Administered 2021-06-02: 50 ug via INTRAVENOUS

## 2021-06-02 MED ORDER — MOXIFLOXACIN HCL 0.5 % OP SOLN
OPHTHALMIC | Status: DC | PRN
Start: 2021-06-02 — End: 2021-06-02
  Administered 2021-06-02: 0.2 mL via OPHTHALMIC

## 2021-06-02 MED ORDER — SIGHTPATH DOSE#1 SODIUM HYALURONATE 23 MG/ML IO SOLUTION
PREFILLED_SYRINGE | INTRAOCULAR | Status: DC | PRN
  Administered 2021-06-02: 0.6 mL via INTRAOCULAR

## 2021-06-02 MED ORDER — SIGHTPATH DOSE#1 SODIUM HYALURONATE 10 MG/ML IO SOLUTION
PREFILLED_SYRINGE | INTRAOCULAR | Status: DC | PRN
  Administered 2021-06-02: 0.85 mL via INTRAOCULAR

## 2021-06-02 MED ORDER — ACETAMINOPHEN 160 MG/5ML PO SOLN: 975.0000 mg | Freq: Once | ORAL | Status: DC | PRN

## 2021-06-02 MED ORDER — SIGHTPATH DOSE#1 BSS IO SOLN
INTRAOCULAR | Status: DC | PRN
  Administered 2021-06-02: 61 mL via OPHTHALMIC

## 2021-06-02 MED ORDER — ONDANSETRON HCL 4 MG/2ML IJ SOLN: 4.0000 mg | Freq: Once | INTRAMUSCULAR | Status: DC | PRN

## 2021-06-02 MED ORDER — ARMC OPHTHALMIC DILATING DROPS
1.0000 "application " | OPHTHALMIC | Status: DC | PRN
  Administered 2021-06-02 (×3): 1 via OPHTHALMIC

## 2021-06-02 MED ORDER — LIDOCAINE HCL (PF) 2 % IJ SOLN
INTRAOCULAR | Status: DC | PRN
  Administered 2021-06-02: 1 mL via INTRAOCULAR

## 2021-06-02 MED ORDER — TETRACAINE HCL 0.5 % OP SOLN
1.0000 [drp] | OPHTHALMIC | Status: DC | PRN
  Administered 2021-06-02 (×3): 1 [drp] via OPHTHALMIC

## 2021-06-02 MED ORDER — BRIMONIDINE TARTRATE-TIMOLOL 0.2-0.5 % OP SOLN
OPHTHALMIC | Status: DC | PRN
  Administered 2021-06-02: 1 [drp] via OPHTHALMIC

## 2021-06-02 MED ORDER — MIDAZOLAM HCL 2 MG/2ML IJ SOLN
INTRAMUSCULAR | Status: DC | PRN
  Administered 2021-06-02: 1 mg via INTRAVENOUS

## 2021-06-02 MED ORDER — ONDANSETRON HCL 4 MG/2ML IJ SOLN
INTRAMUSCULAR | Status: DC | PRN
  Administered 2021-06-02: 4 mg via INTRAVENOUS

## 2021-06-02 MED ORDER — ACETAMINOPHEN 500 MG PO TABS: 1000.0000 mg | ORAL_TABLET | Freq: Once | ORAL | Status: DC | PRN

## 2021-06-02 SURGICAL SUPPLY — 20 items

## 2021-06-02 NOTE — Op Note (Signed)
OPERATIVE NOTE  Anna Juarez [id number] 06/02/2021   PREOPERATIVE DIAGNOSIS:  Nuclear sclerotic cataract right eye.  H25.11   POSTOPERATIVE DIAGNOSIS:    Nuclear sclerotic cataract right eye.     PROCEDURE:  Phacoemusification with posterior chamber intraocular lens placement of the right eye   LENS:   Implant Name Type Inv. Item Serial No. Manufacturer Lot No. LRB No. Used Action  LENS IOL TECNIS EYHANCE 22.5 - [id number] Intraocular Lens LENS IOL TECNIS EYHANCE 22.5 [id number] [employer]  Right 1 Implanted       Procedure(s) with comments: CATARACT EXTRACTION PHACO AND INTRAOCULAR LENS PLACEMENT (IOC) RIGHT DIABETIC (Right) - 2.52 0:22.8  DIB00 +22.5   ULTRASOUND TIME: 0 minutes 22 seconds.  CDE 2.52   SURGEON:  Benay Pillow, MD, MPH  ANESTHESIOLOGIST: Anesthesiologist: April Manson, MD CRNA: Garner Nash, CRNA   ANESTHESIA:  Topical with tetracaine drops augmented with 1% preservative-free intracameral lidocaine.  ESTIMATED BLOOD LOSS: less than 1 mL.   COMPLICATIONS:  None.   DESCRIPTION OF PROCEDURE:  The patient was identified in the holding room and transported to the operating room and placed in the supine position under the operating microscope.  The right eye was identified as the operative eye and it was prepped and draped in the usual sterile ophthalmic fashion.   A 1.0 millimeter clear-corneal paracentesis was made at the 10:30 position. 0.5 ml of preservative-free 1% lidocaine with epinephrine was injected into the anterior chamber.  The anterior chamber was filled with Healon 5 viscoelastic.  A 2.4 millimeter keratome was used to make a near-clear corneal incision at the 8:00 position.  A curvilinear capsulorrhexis was made with a cystotome and capsulorrhexis forceps.  Balanced salt solution was used to hydrodissect and hydrodelineate the nucleus.   Phacoemulsification was then used in stop and chop fashion to remove the lens nucleus and epinucleus.   The remaining cortex was then removed using the irrigation and aspiration handpiece. Healon was then placed into the capsular bag to distend it for lens placement.  A lens was then injected into the capsular bag.  The remaining viscoelastic was aspirated.   Wounds were hydrated with balanced salt solution.  The anterior chamber was inflated to a physiologic pressure with balanced salt solution.   Intracameral vigamox 0.1 mL undiluted was injected into the eye and a drop placed onto the ocular surface.  No wound leaks were noted.  The patient was taken to the recovery room in stable condition without complications of anesthesia or surgery  Benay Pillow 06/02/2021, 7:55 AM

## 2021-06-02 NOTE — Anesthesia Postprocedure Evaluation (Signed)
Anesthesia Post Note  Patient: Anna Juarez  Procedure(s) Performed: CATARACT EXTRACTION PHACO AND INTRAOCULAR LENS PLACEMENT (IOC) RIGHT DIABETIC (Right: Eye)     Patient location during evaluation: PACU Anesthesia Type: MAC Level of consciousness: awake and alert Pain management: pain level controlled Vital Signs Assessment: post-procedure vital signs reviewed and stable Respiratory status: spontaneous breathing, nonlabored ventilation and respiratory function stable Cardiovascular status: stable and blood pressure returned to baseline Postop Assessment: no apparent nausea or vomiting Anesthetic complications: no   No notable events documented.  April Manson

## 2021-06-02 NOTE — H&P (Signed)
[facility]   Primary Care Physician:  [name], NP Ophthalmologist: Dr. Benay Pillow  Pre-Procedure History & Physical: HPI:  Anna Juarez is a 71 y.o. female here for cataract surgery.   Past Medical History:  Diagnosis Date   Arthritis    Chronic kidney disease    Complication of anesthesia    Diabetes mellitus without complication ([place])    Extreme obesity    Fatty liver    GERD (gastroesophageal reflux disease)    Hyperlipidemia    Hypertension    Palpitations    PONV (postoperative nausea and vomiting)    Radiculopathy, cervical    Renal insufficiency    stage 3   Sleep apnea    uses cpap   Tear of acetabular labrum 01/2021   right    Past Surgical History:  Procedure Laterality Date   ABDOMINAL HYSTERECTOMY     CERVICAL FUSION     x2   CHOLECYSTECTOMY     COLONOSCOPY     JOINT REPLACEMENT Bilateral    knees    Prior to Admission medications   Medication Sig Start Date End Date Taking? Authorizing Provider  acetaminophen (TYLENOL) 500 MG tablet Take 2 tablets (1,000 mg total) by mouth every 6 (six) hours as needed for mild pain. 02/06/21  Yes Piscoya, Jacqulyn Bath, MD  Ascorbic Acid (VITAMIN C) 1000 MG tablet Take 1,000 mg by mouth daily.   Yes [provider]  benazepril (LOTENSIN) 40 MG tablet Take 1 tablet (40 mg total) by mouth daily. 01/31/21  Yes [name], NP  dapagliflozin propanediol (FARXIGA) 10 MG TABS tablet Take 1 tablet (10 mg total) by mouth daily before breakfast. 06/27/20  Yes [name], NP  glipiZIDE (GLUCOTROL) 5 MG tablet TAKE ONE TABLET BY MOUTH TWICE DAILY BEFORE A meal 04/29/21  Yes [name], NP  lovastatin (MEVACOR) 40 MG tablet TAKE ONE TABLET BY MOUTH EVERYDAY AT BEDTIME 04/29/21  Yes [name], NP  metoprolol succinate (TOPROL-XL) 50 MG 24 hr tablet TAKE ONE TABLET BY MOUTH ONCE DAILY with A meal 04/29/21  Yes [name], NP  pantoprazole (PROTONIX) 20 MG tablet Take 1 tablet (20 mg  total) by mouth daily. 01/27/21  Yes [name], NP  pregabalin (LYRICA) 150 MG capsule TAKE ONE CAPSULE BY MOUTH THREE TIMES DAILY 01/20/21  Yes [name], NP  traMADol (ULTRAM) 50 MG tablet Take 1 tablet (50 mg total) by mouth every 8 (eight) hours as needed. 05/01/21  Yes [name], NP  traZODone (DESYREL) 100 MG tablet Take 1 tablet (100 mg total) by mouth at bedtime as needed for sleep. 01/27/21  Yes [name], NP  glucose blood test strip Use as instructed 01/27/21   [name], NP    Allergies as of 04/22/2021 - Review Complete 03/27/2021  Allergen Reaction Noted   Actos [pioglitazone] Nausea And Vomiting 10/02/2014   Metformin and related Diarrhea 10/02/2014   Scopolamine Nausea And Vomiting 01/31/2021   Victoza [liraglutide] Nausea And Vomiting 10/02/2014    Family History  Problem Relation Age of Onset   Diabetes Mother    Heart disease Mother    Hypertension Mother    Heart disease Father    Cancer Brother    Hypertension Brother    Breast cancer Maternal Aunt    Breast cancer Paternal Aunt     Social History   Socioeconomic History   Marital status: Married    Spouse name: Not on file   Number of children: Not on  file   Years of education: Not on file   Highest education level: 12th grade  Occupational History   Occupation: retired   Tobacco Use   Smoking status: Never    Passive exposure: Never   Smokeless tobacco: Never  Vaping Use   Vaping Use: Never used  Substance and Sexual Activity   Alcohol use: No   Drug use: No   Sexual activity: Not Currently  Other Topics Concern   Not on file  Social History Narrative   [place]; lives with husband; never smoker; no alcohol; retd. Manufacturing.    Social Determinants of Health   Financial Resource Strain: Low Risk    Difficulty of Paying Living Expenses: Not hard at all  Food Insecurity: No Food Insecurity   Worried About Charity fundraiser in the  Last Year: Never true   [place] in the Last Year: Never true  Transportation Needs: No Transportation Needs   Lack of Transportation (Medical): No   Lack of Transportation (Non-Medical): No  Physical Activity: Inactive   Days of Exercise per Week: 0 days   Minutes of Exercise per Session: 0 min  Stress: No Stress Concern Present   Feeling of Stress : Not at all  Social Connections: Not on file  Intimate Partner Violence: Not on file    Review of Systems: See HPI, otherwise negative ROS  Physical Exam: BP (!) 159/58    Pulse (!) 53    Temp (!) 97.2 F (36.2 C) (Temporal)    Resp 16    Ht 5\' 2"  (1.575 m)    Wt 136.1 kg    SpO2 96%    BMI 54.87 kg/m  General:   Alert, cooperative in NAD Head:  Normocephalic and atraumatic. Respiratory:  Normal work of breathing. Cardiovascular:  RRR  Impression/Plan: Anna Juarez is here for cataract surgery.  Risks, benefits, limitations, and alternatives regarding cataract surgery have been reviewed with the patient.  Questions have been answered.  All parties agreeable.   Benay Pillow, MD  06/02/2021, 7:14 AM

## 2021-06-02 NOTE — Anesthesia Postprocedure Evaluation (Deleted)
Anesthesia Post Note  Patient: Anna Juarez  Procedure(s) Performed: CATARACT EXTRACTION PHACO AND INTRAOCULAR LENS PLACEMENT (IOC) RIGHT DIABETIC (Right: Eye)     Anesthesia Type: MAC Anesthetic complications: no   No notable events documented.  April Manson

## 2021-06-02 NOTE — Transfer of Care (Signed)
Immediate Anesthesia Transfer of Care Note  Patient: Anna Juarez  Procedure(s) Performed: CATARACT EXTRACTION PHACO AND INTRAOCULAR LENS PLACEMENT (IOC) RIGHT DIABETIC (Right: Eye)  Patient Location: PACU  Anesthesia Type: MAC  Level of Consciousness: awake, alert  and patient cooperative  Airway and Oxygen Therapy: Patient Spontanous Breathing and Patient connected to supplemental oxygen  Post-op Assessment: Post-op Vital signs reviewed, Patient's Cardiovascular Status Stable, Respiratory Function Stable, Patent Airway and No signs of Nausea or vomiting  Post-op Vital Signs: Reviewed and stable  Complications: No notable events documented.

## 2021-06-02 NOTE — Anesthesia Preprocedure Evaluation (Signed)
Anesthesia Evaluation  Patient identified by MRN, date of birth, ID band Patient awake    Reviewed: Allergy & Precautions, H&P , NPO status , Patient's Chart, lab work & pertinent test results, reviewed documented beta blocker date and time   History of Anesthesia Complications (+) PONV and history of anesthetic complications (PONV)  Airway Mallampati: II  TM Distance: >3 FB Neck ROM: full    Dental no notable dental hx.    Pulmonary neg pulmonary ROS, sleep apnea ,    Pulmonary exam normal breath sounds clear to auscultation       Cardiovascular Exercise Tolerance: Good hypertension, negative cardio ROS   Rhythm:regular Rate:Normal     Neuro/Psych negative neurological ROS  negative psych ROS   GI/Hepatic negative GI ROS, Neg liver ROS, GERD  ,FLD   Endo/Other  negative endocrine ROSdiabetes  Renal/GU CRFRenal disease (CKD3)negative Renal ROS  negative genitourinary   Musculoskeletal   Abdominal   Peds  Hematology negative hematology ROS (+)   Anesthesia Other Findings BMI ~54  Reproductive/Obstetrics negative OB ROS                             Anesthesia Physical Anesthesia Plan  ASA: 3  Anesthesia Plan: MAC   Post-op Pain Management:    Induction:   PONV Risk Score and Plan: 3 and TIVA, Midazolam and Treatment may vary due to age or medical condition  Airway Management Planned:   Additional Equipment:   Intra-op Plan:   Post-operative Plan:   Informed Consent: I have reviewed the patients History and Physical, chart, labs and discussed the procedure including the risks, benefits and alternatives for the proposed anesthesia with the patient or authorized representative who has indicated his/her understanding and acceptance.     Dental Advisory Given  Plan Discussed with: CRNA  Anesthesia Plan Comments:         Anesthesia Quick Evaluation

## 2021-06-03 ENCOUNTER — Encounter: Payer: Self-pay | Admitting: Ophthalmology

## 2021-06-03 DIAGNOSIS — E1169 Type 2 diabetes mellitus with other specified complication: Secondary | ICD-10-CM | POA: Diagnosis not present

## 2021-06-03 DIAGNOSIS — E1159 Type 2 diabetes mellitus with other circulatory complications: Secondary | ICD-10-CM

## 2021-06-03 DIAGNOSIS — E785 Hyperlipidemia, unspecified: Secondary | ICD-10-CM | POA: Diagnosis not present

## 2021-06-03 DIAGNOSIS — I152 Hypertension secondary to endocrine disorders: Secondary | ICD-10-CM

## 2021-06-03 DIAGNOSIS — E1142 Type 2 diabetes mellitus with diabetic polyneuropathy: Secondary | ICD-10-CM

## 2021-06-10 ENCOUNTER — Encounter: Payer: Self-pay | Admitting: Internal Medicine

## 2021-06-10 ENCOUNTER — Telehealth: Payer: Self-pay

## 2021-06-10 ENCOUNTER — Other Ambulatory Visit: Payer: Self-pay

## 2021-06-10 ENCOUNTER — Ambulatory Visit: Payer: Self-pay | Admitting: *Deleted

## 2021-06-10 ENCOUNTER — Ambulatory Visit: Payer: Medicare PPO | Admitting: Internal Medicine

## 2021-06-10 VITALS — BP 107/62 | HR 51 | Temp 97.5°F | Ht 62.01 in | Wt 302.8 lb

## 2021-06-10 DIAGNOSIS — K5900 Constipation, unspecified: Secondary | ICD-10-CM | POA: Insufficient documentation

## 2021-06-10 DIAGNOSIS — K625 Hemorrhage of anus and rectum: Secondary | ICD-10-CM

## 2021-06-10 LAB — CBC WITH DIFFERENTIAL/PLATELET
Hematocrit: 42 % (ref 34.0–46.6)
Hemoglobin: 13.9 g/dL (ref 11.1–15.9)
Lymphocytes Absolute: 2.1 10*3/uL (ref 0.7–3.1)
Lymphs: 31 %
MCH: 29.8 pg (ref 26.6–33.0)
MCHC: 33.1 g/dL (ref 31.5–35.7)
MCV: 90 fL (ref 79–97)
MID (Absolute): 0.3 10*3/uL (ref 0.1–1.6)
MID: 5 %
Neutrophils Absolute: 4.5 10*3/uL (ref 1.4–7.0)
Neutrophils: 65 %
Platelets: 169 10*3/uL (ref 150–450)
RBC: 4.67 x10E6/uL (ref 3.77–5.28)
RDW: 14.3 % (ref 11.7–15.4)
WBC: 6.9 10*3/uL (ref 3.4–10.8)

## 2021-06-10 MED ORDER — HYDROCORTISONE 2.5 % EX CREA
TOPICAL_CREAM | Freq: Two times a day (BID) | CUTANEOUS | 2 refills | Status: DC
Start: 2021-06-10 — End: 2021-12-18

## 2021-06-10 MED ORDER — LUBIPROSTONE 8 MCG PO CAPS: 8.0000 ug | ORAL_CAPSULE | Freq: Two times a day (BID) | ORAL | 0 refills | Status: AC

## 2021-06-10 NOTE — Telephone Encounter (Signed)
?  Chief Complaint: rectal bleeding and pain with BM.    ?Symptoms: blood on toilet paper but last night the toilet water was red and I had a lot of pain passing a BM. ?Frequency: Noticed it first on [place]. ?Pertinent Negatives: Patient denies dizziness or hemorrhoids.  ?Disposition: '[]'$ ED /'[]'$ Urgent Care (no appt availability in office) / '[x]'$ Appointment(In office/virtual)/ '[]'$  [facility]/ '[]'$ Home Care/ '[]'$ Refused Recommended Disposition /'[]'$ [facility]/ '[]'$  Follow-up with PCP ?Additional Notes: Appt today at 2:20 with Dr. Neomia Dear.  Instructed to go to the ED if she developed dizziness or passed out.   ?

## 2021-06-10 NOTE — Progress Notes (Signed)
? ?BP 107/62   Pulse (!) 51   Temp (!) 97.5 ?F (36.4 ?C) (Oral)   Ht 5' 2.01" (1.575 m)   Wt (!) 302 lb 12.8 oz (137.3 kg)   SpO2 96%   BMI 55.37 kg/m?   ? ?Subjective:  ? ? Patient ID: Anna Juarez, female    DOB: 19-Jul-1950, 71 y.o.   MRN: [id number] ? ?Chief Complaint  ?Patient presents with  ? Hemorrhoids  ?  Has been having trouble with them since Thursday, has been constipated  ? ? ?HPI: ?ALLYSHA Juarez is a 71 y.o. female ? ?Constipation ?This is a new (BRBPR x last night. hurt last night) problem. The current episode started more than 1 year ago. The problem has been gradually worsening since onset. The patient is not on a high fiber diet. She Does not exercise regularly. There has Not been adequate water intake. Pertinent negatives include no abdominal pain, anorexia, fecal incontinence, flatus, hematochezia, hemorrhoids, melena, nausea, rectal pain or vomiting.  ? ?Chief Complaint  ?Patient presents with  ? Hemorrhoids  ?  Has been having trouble with them since Thursday, has been constipated  ? ? ?Relevant past medical, surgical, family and social history reviewed and updated as indicated. Interim medical history since our last visit reviewed. ?Allergies and medications reviewed and updated. ? ?Review of Systems  ?Gastrointestinal:  Positive for constipation. Negative for abdominal pain, anorexia, flatus, hematochezia, hemorrhoids, melena, nausea, rectal pain and vomiting.  ? ?Per HPI unless specifically indicated above ? ?   ?Objective:  ?  ?BP 107/62   Pulse (!) 51   Temp (!) 97.5 ?F (36.4 ?C) (Oral)   Ht 5' 2.01" (1.575 m)   Wt (!) 302 lb 12.8 oz (137.3 kg)   SpO2 96%   BMI 55.37 kg/m?   ?Wt Readings from Last 3 Encounters:  ?06/10/21 (!) 302 lb 12.8 oz (137.3 kg)  ?06/02/21 300 lb (136.1 kg)  ?05/16/21 (!) 305 lb (138.3 kg)  ?  ?Physical Exam ?Vitals and nursing note reviewed.  ?Constitutional:   ?   General: She is not in acute distress. ?   Appearance: Normal appearance. She is not  ill-appearing or diaphoretic.  ?Eyes:  ?   Conjunctiva/sclera: Conjunctivae normal.  ?Pulmonary:  ?   Breath sounds: No rhonchi.  ?Abdominal:  ?   General: Abdomen is flat. Bowel sounds are normal. There is no distension.  ?   Palpations: Abdomen is soft. There is no mass.  ?   Tenderness: There is no abdominal tenderness. There is no guarding.  ?Skin: ?   General: Skin is warm and dry.  ?   Coloration: Skin is not jaundiced.  ?   Findings: No erythema.  ?Neurological:  ?   Mental Status: She is alert.  ? ? ?Results for orders placed or performed in visit on 06/10/21  ?CBC With Differential/Platelet  ?Result Value Ref Range  ? WBC 6.9 3.4 - 10.8 x10E3/uL  ? RBC 4.67 3.77 - 5.28 x10E6/uL  ? Hemoglobin 13.9 11.1 - 15.9 g/dL  ? Hematocrit 42.0 34.0 - 46.6 %  ? MCV 90 79 - 97 fL  ? MCH 29.8 26.6 - 33.0 pg  ? MCHC 33.1 31.5 - 35.7 g/dL  ? RDW 14.3 11.7 - 15.4 %  ? Platelets 169 150 - 450 x10E3/uL  ? Neutrophils 65 Not Estab. %  ? Lymphs 31 Not Estab. %  ? MID 5 Not Estab. %  ? Neutrophils Absolute 4.5 1.4 -  7.0 x10E3/uL  ? Lymphocytes Absolute 2.1 0.7 - 3.1 x10E3/uL  ? MID (Absolute) 0.3 0.1 - 1.6 X10E3/uL  ? ?   ? ? ?Current Outpatient Medications:  ?  acetaminophen (TYLENOL) 500 MG tablet, Take 2 tablets (1,000 mg total) by mouth every 6 (six) hours as needed for mild pain., Disp: , Rfl:  ?  Ascorbic Acid (VITAMIN C) 1000 MG tablet, Take 1,000 mg by mouth daily., Disp: , Rfl:  ?  benazepril (LOTENSIN) 40 MG tablet, Take 1 tablet (40 mg total) by mouth daily., Disp: 90 tablet, Rfl: 1 ?  dapagliflozin propanediol (FARXIGA) 10 MG TABS tablet, Take 1 tablet (10 mg total) by mouth daily before breakfast., Disp: 90 tablet, Rfl: 1 ?  glipiZIDE (GLUCOTROL) 5 MG tablet, TAKE ONE TABLET BY MOUTH TWICE DAILY BEFORE A meal, Disp: 180 tablet, Rfl: 1 ?  glucose blood test strip, Use as instructed, Disp: 100 each, Rfl: 12 ?  lovastatin (MEVACOR) 40 MG tablet, TAKE ONE TABLET BY MOUTH EVERYDAY AT BEDTIME, Disp: 90 tablet, Rfl: 1 ?   lubiprostone (AMITIZA) 8 MCG capsule, Take 1 capsule (8 mcg total) by mouth 2 (two) times daily with a meal for 14 days., Disp: 28 capsule, Rfl: 0 ?  metoprolol succinate (TOPROL-XL) 50 MG 24 hr tablet, TAKE ONE TABLET BY MOUTH ONCE DAILY with A meal, Disp: 90 tablet, Rfl: 1 ?  pantoprazole (PROTONIX) 20 MG tablet, Take 1 tablet (20 mg total) by mouth daily., Disp: 90 tablet, Rfl: 1 ?  pregabalin (LYRICA) 150 MG capsule, TAKE ONE CAPSULE BY MOUTH THREE TIMES DAILY, Disp: 270 capsule, Rfl: 1 ?  traMADol (ULTRAM) 50 MG tablet, Take 1 tablet (50 mg total) by mouth every 8 (eight) hours as needed., Disp: 30 tablet, Rfl: 1 ?  traZODone (DESYREL) 100 MG tablet, Take 1 tablet (100 mg total) by mouth at bedtime as needed for sleep., Disp: 90 tablet, Rfl: 1 ?  hydrocortisone 2.5 % cream, Apply topically 2 (two) times daily., Disp: 30 g, Rfl: 2  ? ? ?Assessment & Plan:  ?Constipation :  ?Will start pt on amitiza , increase fiber in diet.  ?Increase vegetables in diet.  ?Increase water to 8 - 10 glasses a day ? ?Problem List Items Addressed This Visit   ? ?  ? Digestive  ? BRBPR (bright red blood per rectum) - Primary  ? Relevant Orders  ? CBC With Differential/Platelet (Completed)  ? Ambulatory referral to Gastroenterology  ?  ? Other  ? Constipation  ? Relevant Orders  ? Ambulatory referral to Gastroenterology  ?  ? ?Orders Placed This Encounter  ?Procedures  ? CBC With Differential/Platelet  ? Ambulatory referral to Gastroenterology  ?  ? ?Meds ordered this encounter  ?Medications  ? hydrocortisone 2.5 % cream  ?  Sig: Apply topically 2 (two) times daily.  ?  Dispense:  30 g  ?  Refill:  2  ? lubiprostone (AMITIZA) 8 MCG capsule  ?  Sig: Take 1 capsule (8 mcg total) by mouth 2 (two) times daily with a meal for 14 days.  ?  Dispense:  28 capsule  ?  Refill:  0  ?  ? ?Follow up plan: ?Return in about 2 weeks (around 06/24/2021). ? ? ?

## 2021-06-10 NOTE — Telephone Encounter (Signed)
Scheduled for 08/05/2021 ?

## 2021-06-10 NOTE — Telephone Encounter (Signed)
Reason for Disposition ? MODERATE rectal bleeding (small blood clots, passing blood without stool, or toilet water turns red) ? ?Answer Assessment - Initial Assessment Questions ?1. APPEARANCE of BLOOD: "What color is it?" "Is it passed separately, on the surface of the stool, or mixed in with the stool?"  ?    Bleeding from hemorrhoids.    Last night when I had a BM the toilet was full of blood.   Last night it hurt so to pass the BM.    I'm taking stool softener.    I had my gallbladder removed in Nov.   I'm having trouble with my stomach and not being able to eat well.   ?2. AMOUNT: "How much blood was passed?"  ?    Toilet was full last night of blood.   On the toilet paper.    ?3. FREQUENCY: "How many times has blood been passed with the stools?"  ?    I had a little on the toilet paper of blood.   But last night I had a lot of blood and rectal pain.    ?4. ONSET: "When was the blood first seen in the stools?" (Days or weeks)  ?    *No Answer* ?5. DIARRHEA: "Is there also some diarrhea?" If Yes, ask: "How many diarrhea stools in the past 24 hours?"  ?    *No Answer* ?6. CONSTIPATION: "Do you have constipation?" If Yes, ask: "How bad is it?" ?    *No Answer* ?7. RECURRENT SYMPTOMS: "Have you had blood in your stools before?" If Yes, ask: "When was the last time?" and "What happened that time?"  ?    *No Answer* ?8. BLOOD THINNERS: "Do you take any blood thinners?" (e.g., Coumadin/warfarin, Pradaxa/dabigatran, aspirin) ?    *No Answer* ?9. OTHER SYMPTOMS: "Do you have any other symptoms?"  (e.g., abdomen pain, vomiting, dizziness, fever) ?    *No Answer* ?10. PREGNANCY: "Is there any chance you are pregnant?" "When was your last menstrual period?" ?      *No Answer* ? ?Protocols used: Rectal Bleeding-A-AH ? ?

## 2021-06-11 NOTE — Discharge Instructions (Signed)

## 2021-06-12 ENCOUNTER — Telehealth: Payer: Self-pay

## 2021-06-12 ENCOUNTER — Other Ambulatory Visit: Payer: Self-pay | Admitting: Nurse Practitioner

## 2021-06-12 NOTE — Telephone Encounter (Signed)
Requested Prescriptions  ?Pending Prescriptions Disp Refills  ?? traZODone (DESYREL) 100 MG tablet [Pharmacy Med Name: TRAZODONE 100 MG TABLET] 90 tablet 1  ?  Sig: TAKE 1 TABLET BY MOUTH AT BEDTIME AS NEEDED FOR SLEEP.  ?  ? Psychiatry: Antidepressants - Serotonin Modulator Passed - 06/12/2021  7:43 AM  ?  ?  Passed - Valid encounter within last 6 months  ?  Recent Outpatient Visits   ?      ? 2 days ago BRBPR (bright red blood per rectum)  ? [facility] Vigg, Avanti, MD  ? 1 month ago Acute cystitis without hematuria  ? [place], NP  ? 2 months ago Hypertension associated with diabetes [facility])  ? [place], NP  ? 3 months ago Acute cystitis without hematuria  ? [place], DO  ? 4 months ago Pre-op exam  ? [facility] [name], NP  ?  ?  ?Future Appointments   ?        ? In 1 week [name], NP [facility], [facility]  ? In 1 month Lucilla Lame, MD [place]  ? In 1 month  [place], [facility]  ?  ? ?  ?  ?  ?? pantoprazole (PROTONIX) 20 MG tablet [Pharmacy Med Name: PANTOPRAZOLE SOD DR 20 MG TAB] 90 tablet 1  ?  Sig: TAKE 1 TABLET BY MOUTH EVERY DAY  ?  ? Gastroenterology: Proton Pump Inhibitors Passed - 06/12/2021  7:43 AM  ?  ?  Passed - Valid encounter within last 12 months  ?  Recent Outpatient Visits   ?      ? 2 days ago BRBPR (bright red blood per rectum)  ? [facility] Vigg, Avanti, MD  ? 1 month ago Acute cystitis without hematuria  ? [place], NP  ? 2 months ago Hypertension associated with diabetes [facility])  ? [place], NP  ? 3 months ago Acute cystitis without hematuria  ? [place], DO  ? 4 months ago Pre-op exam  ? [facility] [name], NP  ?  ?  ?Future Appointments   ?        ? In 1 week [name],  NP [facility], [facility]  ? In 1 month Lucilla Lame, MD [place]  ? In 1 month  [place], [facility]  ?  ? ?  ?  ?  ?? traZODone (DESYREL) 50 MG tablet [Pharmacy Med Name: TRAZODONE 50 MG TABLET] 30 tablet 0  ?  Sig: TAKE 1/2 TO 1 TABLET BY MOUTH AT BEDTIME AS NEEDED FOR SLEEP  ?  ? Psychiatry: Antidepressants - Serotonin Modulator Passed - 06/12/2021  7:43 AM  ?  ?  Passed - Valid encounter within last 6 months  ?  Recent Outpatient Visits   ?      ? 2 days ago BRBPR (bright red blood per rectum)  ? [facility] Vigg, Avanti, MD  ? 1 month ago Acute cystitis without hematuria  ? [place], NP  ? 2 months ago Hypertension associated with diabetes [facility])  ? [place], NP  ? 3 months ago Acute cystitis without hematuria  ? [place], DO  ? 4 months ago Pre-op exam  ? [facility]  [name], NP  ?  ?  ?Future Appointments   ?        ? In 1 week [name], NP [facility], [facility]  ? In 1 month Lucilla Lame, MD [place]  ? In 1 month  [place], [facility]  ?  ? ?  ?  ?  ? ?

## 2021-06-12 NOTE — Telephone Encounter (Signed)
PA started for Lubiprostone 8 mcg caps through Covermy meds. Awaiting on determination ? ?

## 2021-06-12 NOTE — Telephone Encounter (Signed)
Requested by interface surescripts. Dose changed. Medication discontinued 03/27/21.  ?Requested Prescriptions  ?Signed Prescriptions Disp Refills  ? traZODone (DESYREL) 100 MG tablet 90 tablet 1  ?  Sig: TAKE 1 TABLET BY MOUTH AT BEDTIME AS NEEDED FOR SLEEP.  ?  ? Psychiatry: Antidepressants - Serotonin Modulator Passed - 06/12/2021  7:43 AM  ?  ?  Passed - Valid encounter within last 6 months  ?  Recent Outpatient Visits   ?      ? 2 days ago BRBPR (bright red blood per rectum)  ? [facility] Vigg, Avanti, MD  ? 1 month ago Acute cystitis without hematuria  ? [place], NP  ? 2 months ago Hypertension associated with diabetes [facility])  ? [place], NP  ? 3 months ago Acute cystitis without hematuria  ? [place], DO  ? 4 months ago Pre-op exam  ? [facility] [name], NP  ?  ?  ?Future Appointments   ?        ? In 1 week [name], NP [facility], [facility]  ? In 1 month Lucilla Lame, MD [place]  ? In 1 month  [place], [facility]  ?  ? ?  ?  ?  ? pantoprazole (PROTONIX) 20 MG tablet 90 tablet 1  ?  Sig: TAKE 1 TABLET BY MOUTH EVERY DAY  ?  ? Gastroenterology: Proton Pump Inhibitors Passed - 06/12/2021  7:43 AM  ?  ?  Passed - Valid encounter within last 12 months  ?  Recent Outpatient Visits   ?      ? 2 days ago BRBPR (bright red blood per rectum)  ? [facility] Vigg, Avanti, MD  ? 1 month ago Acute cystitis without hematuria  ? [place], NP  ? 2 months ago Hypertension associated with diabetes [facility])  ? [place], NP  ? 3 months ago Acute cystitis without hematuria  ? [place], DO  ? 4 months ago Pre-op exam  ? [facility] [name], NP  ?  ?  ?Future Appointments   ?        ? In 1 week [name], NP [facility], [facility]  ? In 1 month Lucilla Lame, MD [place]  ? In 1 month  [place], [facility]  ?  ? ?  ?  ?  ?Refused Prescriptions Disp Refills  ?? traZODone (DESYREL) 50 MG tablet [Pharmacy Med Name: TRAZODONE 50 MG TABLET] 30 tablet 0  ?  Sig: TAKE 1/2 TO 1 TABLET BY MOUTH AT BEDTIME AS NEEDED FOR SLEEP  ?  ? Psychiatry: Antidepressants - Serotonin Modulator Passed - 06/12/2021  7:43 AM  ?  ?  Passed - Valid encounter within last 6 months  ?  Recent Outpatient Visits   ?      ? 2 days ago BRBPR (bright red blood per rectum)  ? [facility] Vigg, Avanti, MD  ? 1 month ago Acute cystitis without hematuria  ? [place], NP  ? 2 months ago Hypertension associated with diabetes [facility])  ? [place], NP  ? 3 months ago Acute cystitis without hematuria  ? [place], DO  ? 4 months ago Pre-op exam  ? [place],  [place], NP  ?  ?  ?Future Appointments   ?        ? In 1 week [name], NP [facility], [facility]  ? In 1 month Lucilla Lame, MD [place]  ? In 1 month  [place], [facility]  ?  ? ?  ?  ?  ? ?

## 2021-06-16 ENCOUNTER — Encounter: Payer: Self-pay | Admitting: Ophthalmology

## 2021-06-16 ENCOUNTER — Ambulatory Visit: Payer: Medicare PPO | Admitting: Anesthesiology

## 2021-06-16 ENCOUNTER — Ambulatory Visit
Admission: RE | Admit: 2021-06-16 | Discharge: 2021-06-16 | Disposition: A | Discharge status: Home / Self Care | Payer: Medicare PPO | Source: Ambulatory Visit | Attending: Ophthalmology | Admitting: Ophthalmology

## 2021-06-16 ENCOUNTER — Encounter
Admission: RE | Disposition: A | Discharge status: Home / Self Care | Payer: Self-pay | Source: Ambulatory Visit | Attending: Ophthalmology

## 2021-06-16 ENCOUNTER — Other Ambulatory Visit: Payer: Self-pay

## 2021-06-16 DIAGNOSIS — Z6841 Body Mass Index (BMI) 40.0 and over, adult: Secondary | ICD-10-CM | POA: Diagnosis not present

## 2021-06-16 DIAGNOSIS — I129 Hypertensive chronic kidney disease with stage 1 through stage 4 chronic kidney disease, or unspecified chronic kidney disease: Secondary | ICD-10-CM | POA: Insufficient documentation

## 2021-06-16 DIAGNOSIS — E1136 Type 2 diabetes mellitus with diabetic cataract: Secondary | ICD-10-CM | POA: Insufficient documentation

## 2021-06-16 DIAGNOSIS — E1122 Type 2 diabetes mellitus with diabetic chronic kidney disease: Secondary | ICD-10-CM | POA: Insufficient documentation

## 2021-06-16 DIAGNOSIS — N189 Chronic kidney disease, unspecified: Secondary | ICD-10-CM | POA: Diagnosis not present

## 2021-06-16 DIAGNOSIS — E669 Obesity, unspecified: Secondary | ICD-10-CM | POA: Diagnosis not present

## 2021-06-16 DIAGNOSIS — G4730 Sleep apnea, unspecified: Secondary | ICD-10-CM | POA: Insufficient documentation

## 2021-06-16 DIAGNOSIS — H2512 Age-related nuclear cataract, left eye: Secondary | ICD-10-CM | POA: Insufficient documentation

## 2021-06-16 HISTORY — PX: CATARACT EXTRACTION W/PHACO: SHX586

## 2021-06-16 LAB — GLUCOSE, CAPILLARY
Glucose-Capillary: 143 mg/dL — ABNORMAL HIGH (ref 70–99)
Glucose-Capillary: 139 mg/dL — ABNORMAL HIGH (ref 70–99)

## 2021-06-16 SURGERY — PHACOEMULSIFICATION, CATARACT, WITH IOL INSERTION
Anesthesia: Monitor Anesthesia Care | Site: Eye | Laterality: Left

## 2021-06-16 MED ORDER — SIGHTPATH DOSE#1 SODIUM HYALURONATE 23 MG/ML IO SOLUTION
PREFILLED_SYRINGE | INTRAOCULAR | Status: DC | PRN
  Administered 2021-06-16: 0.6 mL via INTRAOCULAR

## 2021-06-16 MED ORDER — MIDAZOLAM HCL 2 MG/2ML IJ SOLN
INTRAMUSCULAR | Status: DC | PRN
Start: 2021-06-16 — End: 2021-06-16
  Administered 2021-06-16: 2 mg via INTRAVENOUS

## 2021-06-16 MED ORDER — SIGHTPATH DOSE#1 BSS IO SOLN
INTRAOCULAR | Status: DC | PRN
Start: 2021-06-16 — End: 2021-06-16
  Administered 2021-06-16: 15 mL

## 2021-06-16 MED ORDER — LACTATED RINGERS IV SOLN: INTRAVENOUS | Status: DC

## 2021-06-16 MED ORDER — ARMC OPHTHALMIC DILATING DROPS
1.0000 "application " | OPHTHALMIC | Status: DC | PRN
  Administered 2021-06-16 (×3): 1 via OPHTHALMIC

## 2021-06-16 MED ORDER — MOXIFLOXACIN HCL 0.5 % OP SOLN
OPHTHALMIC | Status: DC | PRN
  Administered 2021-06-16: 0.2 mL via OPHTHALMIC

## 2021-06-16 MED ORDER — SIGHTPATH DOSE#1 BSS IO SOLN
INTRAOCULAR | Status: DC | PRN
  Administered 2021-06-16: 70 mL via OPHTHALMIC

## 2021-06-16 MED ORDER — LIDOCAINE HCL (PF) 2 % IJ SOLN
INTRAOCULAR | Status: DC | PRN
  Administered 2021-06-16: 1 mL via INTRAOCULAR

## 2021-06-16 MED ORDER — ONDANSETRON HCL 4 MG/2ML IJ SOLN
INTRAMUSCULAR | Status: DC | PRN
  Administered 2021-06-16: 4 mg via INTRAVENOUS

## 2021-06-16 MED ORDER — SIGHTPATH DOSE#1 SODIUM HYALURONATE 10 MG/ML IO SOLUTION
PREFILLED_SYRINGE | INTRAOCULAR | Status: DC | PRN
  Administered 2021-06-16: 0.85 mL via INTRAOCULAR

## 2021-06-16 MED ORDER — TETRACAINE HCL 0.5 % OP SOLN
1.0000 [drp] | OPHTHALMIC | Status: DC | PRN
  Administered 2021-06-16 (×3): 1 [drp] via OPHTHALMIC

## 2021-06-16 MED ORDER — FENTANYL CITRATE (PF) 100 MCG/2ML IJ SOLN
INTRAMUSCULAR | Status: DC | PRN
  Administered 2021-06-16: 50 ug via INTRAVENOUS

## 2021-06-16 SURGICAL SUPPLY — 14 items
CATARACT SUITE SIGHTPATH (MISCELLANEOUS) ×2 IMPLANT
DISSECTOR HYDRO NUCLEUS 50X22 (MISCELLANEOUS) ×2 IMPLANT
FEE CATARACT SUITE SIGHTPATH (MISCELLANEOUS) ×1 IMPLANT
GLOVE SURG GAMMEX PI TX LF 7.5 (GLOVE) ×2 IMPLANT
GLOVE SURG SYN 8.5  E (GLOVE) ×2
GLOVE SURG SYN 8.5 E (GLOVE) ×1 IMPLANT
GLOVE SURG SYN 8.5 PF PI (GLOVE) ×1 IMPLANT
LENS IOL TECNIS EYHANCE 22.0 (Intraocular Lens) ×1 IMPLANT
NDL FILTER BLUNT 18X1 1/2 (NEEDLE) ×1 IMPLANT
NEEDLE FILTER BLUNT 18X 1/2SAF (NEEDLE) ×1
NEEDLE FILTER BLUNT 18X1 1/2 (NEEDLE) ×1 IMPLANT
SYR 3ML LL SCALE MARK (SYRINGE) ×2 IMPLANT
SYR 5ML LL (SYRINGE) ×2 IMPLANT
WATER STERILE IRR 250ML POUR (IV SOLUTION) ×2 IMPLANT

## 2021-06-16 NOTE — Op Note (Signed)
OPERATIVE NOTE ? ?Anna Juarez ?[id number] ?06/16/2021 ? ? ?PREOPERATIVE DIAGNOSIS:  Nuclear sclerotic cataract left eye.  H25.12 ?  ?POSTOPERATIVE DIAGNOSIS:    Nuclear sclerotic cataract left eye.   ?  ?PROCEDURE:  Phacoemusification with posterior chamber intraocular lens placement of the left eye  ? ?LENS:   ?Implant Name Type Inv. Item Serial No. Manufacturer Lot No. LRB No. Used Action  ?LENS IOL TECNIS EYHANCE 22.0 - [id number] Intraocular Lens LENS IOL TECNIS EYHANCE 22.0 [id number] [employer]  Left 1 Implanted  ?    ?Procedure(s): ?CATARACT EXTRACTION PHACO AND INTRAOCULAR LENS PLACEMENT (IOC) LEFT DIABETIC 3.51 00:30.9 (Left) ? ?DIB00 +22.0 ?  ?ULTRASOUND TIME: 0 minutes 30 seconds.  CDE 3.51 ?  ?SURGEON:  Benay Pillow, MD, MPH ?  ?ANESTHESIA:  Topical with tetracaine drops augmented with 1% preservative-free intracameral lidocaine. ? ?ESTIMATED BLOOD LOSS: <1 mL ?  ?COMPLICATIONS:  None. ?  ?DESCRIPTION OF PROCEDURE:  The patient was identified in the holding room and transported to the operating room and placed in the supine position under the operating microscope.  The left eye was identified as the operative eye and it was prepped and draped in the usual sterile ophthalmic fashion. ?  ?A 1.0 millimeter clear-corneal paracentesis was made at the 5:00 position. 0.5 ml of preservative-free 1% lidocaine with epinephrine was injected into the anterior chamber. ? The anterior chamber was filled with Healon 5 viscoelastic.  A 2.4 millimeter keratome was used to make a near-clear corneal incision at the 2:00 position.  A curvilinear capsulorrhexis was made with a cystotome and capsulorrhexis forceps.  Balanced salt solution was used to hydrodissect and hydrodelineate the nucleus. ?  ?Phacoemulsification was then used in stop and chop fashion to remove the lens nucleus and epinucleus.  The remaining cortex was then removed using the irrigation and aspiration handpiece. Healon was then placed into the capsular  bag to distend it for lens placement.  A lens was then injected into the capsular bag.  The remaining viscoelastic was aspirated. ?  ?Wounds were hydrated with balanced salt solution.  The anterior chamber was inflated to a physiologic pressure with balanced salt solution. ? ?Intracameral vigamox 0.1 mL undiltued was injected into the eye and a drop placed onto the ocular surface. ? No wound leaks were noted.  The patient was taken to the recovery room in stable condition without complications of anesthesia or surgery ? ?Benay Pillow ?06/16/2021, 9:27 AM ? ?

## 2021-06-16 NOTE — Anesthesia Preprocedure Evaluation (Addendum)
Anesthesia Evaluation  ?Patient identified by MRN, date of birth, ID band ?Patient awake ? ? ? ?History of Anesthesia Complications ?(+) PONV and history of anesthetic complications ? ?Airway ?Mallampati: II ? ?TM Distance: >3 FB ?Neck ROM: Full ? ? ? Dental ?no notable dental hx. ? ?  ?Pulmonary ?sleep apnea and Continuous Positive Airway Pressure Ventilation ,  ?  ?Pulmonary exam normal ? ? ? ? ? ? ? Cardiovascular ?hypertension, Normal cardiovascular exam ? ? ?  ?Neuro/Psych ?negative neurological ROS ?   ? GI/Hepatic ?negative GI ROS,   ?Endo/Other  ?diabetes, Well Controlled, Type 2Morbid obesity (BMI 11) ? Renal/GU ?Renal InsufficiencyRenal disease  ? ?  ?Musculoskeletal ? ? Abdominal ?  ?Peds ? Hematology ?  ?Anesthesia Other Findings ? ? Reproductive/Obstetrics ? ?  ? ? ? ? ? ? ? ? ? ? ? ? ? ?  ?  ? ? ? ? ? ? ? ?Anesthesia Physical ?Anesthesia Plan ? ?ASA: 3 ? ?Anesthesia Plan: MAC  ? ?Post-op Pain Management:   ? ?Induction:  ? ?PONV Risk Score and Plan: 3 and Midazolam and TIVA ? ?Airway Management Planned: Nasal Cannula and Natural Airway ? ?Additional Equipment: None ? ?Intra-op Plan:  ? ?Post-operative Plan:  ? ?Informed Consent: I have reviewed the patients History and Physical, chart, labs and discussed the procedure including the risks, benefits and alternatives for the proposed anesthesia with the patient or authorized representative who has indicated his/her understanding and acceptance.  ? ? ? ? ? ?Plan Discussed with: CRNA ? ?Anesthesia Plan Comments:   ? ? ? ? ? ? ?Anesthesia Quick Evaluation ? ?

## 2021-06-16 NOTE — Transfer of Care (Signed)
Immediate Anesthesia Transfer of Care Note ? ?Patient: Anna Juarez ? ?Procedure(s) Performed: CATARACT EXTRACTION PHACO AND INTRAOCULAR LENS PLACEMENT (IOC) LEFT DIABETIC 3.51 00:30.9 (Left: Eye) ? ?Patient Location: PACU ? ?Anesthesia Type: MAC ? ?Level of Consciousness: awake, alert  and patient cooperative ? ?Airway and Oxygen Therapy: Patient Spontanous Breathing and Patient connected to supplemental oxygen ? ?Post-op Assessment: Post-op Vital signs reviewed, Patient's Cardiovascular Status Stable, Respiratory Function Stable, Patent Airway and No signs of Nausea or vomiting ? ?Post-op Vital Signs: Reviewed and stable ? ?Complications: No notable events documented. ? ?

## 2021-06-16 NOTE — H&P (Signed)
[facility]  ? ?Primary Care Physician:  [name], NP ?Ophthalmologist: Dr. Benay Pillow ? ?Pre-Procedure History & Physical: ?HPI:  Anna Juarez is a 71 y.o. female here for cataract surgery. ?  ?Past Medical History:  ?Diagnosis Date  ? Arthritis   ? Chronic kidney disease   ? Complication of anesthesia   ? Diabetes mellitus without complication ([place])   ? Extreme obesity   ? Fatty liver   ? GERD (gastroesophageal reflux disease)   ? Hyperlipidemia   ? Hypertension   ? Palpitations   ? PONV (postoperative nausea and vomiting)   ? Radiculopathy, cervical   ? Renal insufficiency   ? stage 3  ? Sleep apnea   ? uses cpap  ? Tear of acetabular labrum 01/2021  ? right  ? ? ?Past Surgical History:  ?Procedure Laterality Date  ? ABDOMINAL HYSTERECTOMY    ? CATARACT EXTRACTION W/PHACO Right 06/02/2021  ? Procedure: CATARACT EXTRACTION PHACO AND INTRAOCULAR LENS PLACEMENT ([place]) RIGHT DIABETIC;  Surgeon: Eulogio Bear, MD;  Location: [facility];  Service: Ophthalmology;  Laterality: Right;  2.52 ?0:22.8  ? CERVICAL FUSION    ? x2  ? CHOLECYSTECTOMY    ? COLONOSCOPY    ? JOINT REPLACEMENT Bilateral   ? knees  ? ? ?Prior to Admission medications   ?Medication Sig Start Date End Date Taking? Authorizing Provider  ?acetaminophen (TYLENOL) 500 MG tablet Take 2 tablets (1,000 mg total) by mouth every 6 (six) hours as needed for mild pain. 02/06/21  Yes Piscoya, Jacqulyn Bath, MD  ?Ascorbic Acid (VITAMIN C) 1000 MG tablet Take 1,000 mg by mouth daily.   Yes [provider]  ?benazepril (LOTENSIN) 40 MG tablet Take 1 tablet (40 mg total) by mouth daily. 01/31/21  Yes [name], NP  ?dapagliflozin propanediol (FARXIGA) 10 MG TABS tablet Take 1 tablet (10 mg total) by mouth daily before breakfast. 06/27/20  Yes [name], NP  ?glipiZIDE (GLUCOTROL) 5 MG tablet TAKE ONE TABLET BY MOUTH TWICE DAILY BEFORE A meal 04/29/21  Yes [name], NP  ?glucose blood test strip Use as instructed  01/27/21  Yes [name], NP  ?hydrocortisone 2.5 % cream Apply topically 2 (two) times daily. 06/10/21  Yes Vigg, Avanti, MD  ?lovastatin (MEVACOR) 40 MG tablet TAKE ONE TABLET BY MOUTH EVERYDAY AT BEDTIME 04/29/21  Yes [name], NP  ?lubiprostone (AMITIZA) 8 MCG capsule Take 1 capsule (8 mcg total) by mouth 2 (two) times daily with a meal for 14 days. 06/10/21 06/24/21 Yes Vigg, Avanti, MD  ?metoprolol succinate (TOPROL-XL) 50 MG 24 hr tablet TAKE ONE TABLET BY MOUTH ONCE DAILY with A meal 04/29/21  Yes [name], NP  ?pantoprazole (PROTONIX) 20 MG tablet TAKE 1 TABLET BY MOUTH EVERY DAY 06/12/21  Yes [name], NP  ?pregabalin (LYRICA) 150 MG capsule TAKE ONE CAPSULE BY MOUTH THREE TIMES DAILY 01/20/21  Yes [name], NP  ?traMADol (ULTRAM) 50 MG tablet Take 1 tablet (50 mg total) by mouth every 8 (eight) hours as needed. 05/01/21  Yes [name], NP  ?traZODone (DESYREL) 100 MG tablet TAKE 1 TABLET BY MOUTH AT BEDTIME AS NEEDED FOR SLEEP. 06/12/21  Yes [name], NP  ? ? ?Allergies as of 04/22/2021 - Review Complete 03/27/2021  ?Allergen Reaction Noted  ? Actos [pioglitazone] Nausea And Vomiting 10/02/2014  ? Metformin and related Diarrhea 10/02/2014  ? Scopolamine Nausea And Vomiting 01/31/2021  ? Victoza [liraglutide] Nausea And Vomiting 10/02/2014  ? ? ?Family  History  ?Problem Relation Age of Onset  ? Diabetes Mother   ? Heart disease Mother   ? Hypertension Mother   ? Heart disease Father   ? Cancer Brother   ? Hypertension Brother   ? Breast cancer Maternal Aunt   ? Breast cancer Paternal Aunt   ? ? ?Social History  ? ?Socioeconomic History  ? Marital status: Married  ?  Spouse name: Not on file  ? Number of children: Not on file  ? Years of education: Not on file  ? Highest education level: 12th grade  ?Occupational History  ? Occupation: retired   ?Tobacco Use  ? Smoking status: Never  ?  Passive exposure: Never  ? Smokeless tobacco: Never  ?Vaping Use  ? Vaping  Use: Never used  ?Substance and Sexual Activity  ? Alcohol use: No  ? Drug use: No  ? Sexual activity: Not Currently  ?Other Topics Concern  ? Not on file  ?Social History Narrative  ? [place]; lives with husband; never smoker; no alcohol; retd. Manufacturing.   ? ?Social Determinants of Health  ? ?Financial Resource Strain: Low Risk   ? Difficulty of Paying Living Expenses: Not hard at all  ?Food Insecurity: No Food Insecurity  ? Worried About Charity fundraiser in the Last Year: Never true  ? Ran Out of Food in the Last Year: Never true  ?Transportation Needs: No Transportation Needs  ? Lack of Transportation (Medical): No  ? Lack of Transportation (Non-Medical): No  ?Physical Activity: Inactive  ? Days of Exercise per Week: 0 days  ? Minutes of Exercise per Session: 0 min  ?Stress: No Stress Concern Present  ? Feeling of Stress : Not at all  ?Social Connections: Not on file  ?Intimate Partner Violence: Not on file  ? ? ?Review of Systems: ?See HPI, otherwise negative ROS ? ?Physical Exam: ?Pulse (!) 54   Temp 97.7 ?F (36.5 ?C) (Temporal)   Ht '5\' 2"'$  (1.575 m)   Wt (!) 137 kg   BMI 55.24 kg/m?  ?General:   Alert, cooperative in NAD ?Head:  Normocephalic and atraumatic. ?Respiratory:  Normal work of breathing. ?Cardiovascular:  RRR ? ?Impression/Plan: ?Anna Juarez is here for cataract surgery. ? ?Risks, benefits, limitations, and alternatives regarding cataract surgery have been reviewed with the patient.  Questions have been answered.  All parties agreeable. ? ? ?Benay Pillow, MD  06/16/2021, 8:54 AM ? ? ?

## 2021-06-16 NOTE — Anesthesia Postprocedure Evaluation (Signed)
Anesthesia Post Note ? ?Patient: Anna Juarez ? ?Procedure(s) Performed: CATARACT EXTRACTION PHACO AND INTRAOCULAR LENS PLACEMENT (IOC) LEFT DIABETIC 3.51 00:30.9 (Left: Eye) ? ? ?  ?Patient location during evaluation: PACU ?Anesthesia Type: MAC ?Level of consciousness: awake and alert ?Pain management: pain level controlled ?Vital Signs Assessment: post-procedure vital signs reviewed and stable ?Respiratory status: spontaneous breathing ?Cardiovascular status: blood pressure returned to baseline ?Postop Assessment: no apparent nausea or vomiting, adequate PO intake and no headache ?Anesthetic complications: no ? ? ?No notable events documented. ? ?Adele Barthel Mitchel Delduca ? ? ? ? ? ?

## 2021-06-17 ENCOUNTER — Encounter: Payer: Self-pay | Admitting: Ophthalmology

## 2021-06-20 ENCOUNTER — Other Ambulatory Visit: Payer: Self-pay | Admitting: Nurse Practitioner

## 2021-06-20 DIAGNOSIS — M545 Low back pain, unspecified: Secondary | ICD-10-CM

## 2021-06-20 NOTE — Telephone Encounter (Signed)
Requested medication (s) are due for refill today -yes ? ?Requested medication (s) are on the active medication list -yes ? ?Future visit scheduled -yes ? ?Last refill: 05/01/21 #30 1RF ? ?Notes to clinic: Request RF: non delegated Rx ? ?Requested Prescriptions  ?Pending Prescriptions Disp Refills  ? traMADol (ULTRAM) 50 MG tablet [Pharmacy Med Name: TRAMADOL HCL 50 MG TABLET] 30 tablet 1  ?  Sig: TAKE 1 TABLET BY MOUTH EVERY 8 HOURS AS NEEDED.  ?  ? Not Delegated - Analgesics:  Opioid Agonists Failed - 06/20/2021  2:49 PM  ?  ?  Failed - This refill cannot be delegated  ?  ?  Failed - Urine Drug Screen completed in last 360 days  ?  ?  Passed - Valid encounter within last 3 months  ?  Recent Outpatient Visits   ? ?      ? 1 week ago BRBPR (bright red blood per rectum)  ? [facility] Vigg, Avanti, MD  ? 1 month ago Acute cystitis without hematuria  ? [place], NP  ? 2 months ago Hypertension associated with diabetes [facility])  ? [place], NP  ? 3 months ago Acute cystitis without hematuria  ? [place], DO  ? 4 months ago Pre-op exam  ? [facility] [name], NP  ? ?  ?  ?Future Appointments   ? ?        ? In 5 days [name], NP [facility] [place]  ? In 1 month Lucilla Lame, MD [place]  ? In 1 month  [place], [facility]  ? ?  ? ?  ?  ?  ? ? ? ?Requested Prescriptions  ?Pending Prescriptions Disp Refills  ? traMADol (ULTRAM) 50 MG tablet [Pharmacy Med Name: TRAMADOL HCL 50 MG TABLET] 30 tablet 1  ?  Sig: TAKE 1 TABLET BY MOUTH EVERY 8 HOURS AS NEEDED.  ?  ? Not Delegated - Analgesics:  Opioid Agonists Failed - 06/20/2021  2:49 PM  ?  ?  Failed - This refill cannot be delegated  ?  ?  Failed - Urine Drug Screen completed in last 360 days  ?  ?  Passed - Valid encounter within last 3 months  ?  Recent Outpatient Visits   ? ?      ? 1 week ago BRBPR  (bright red blood per rectum)  ? [facility] Vigg, Avanti, MD  ? 1 month ago Acute cystitis without hematuria  ? [place], NP  ? 2 months ago Hypertension associated with diabetes [facility])  ? [place], NP  ? 3 months ago Acute cystitis without hematuria  ? [place], DO  ? 4 months ago Pre-op exam  ? [facility] [name], NP  ? ?  ?  ?Future Appointments   ? ?        ? In 5 days [name], NP [facility], [place]  ? In 1 month Lucilla Lame, MD [place]  ? In 1 month  [place], [facility]  ? ?  ? ?  ?  ?  ? ? ? ?

## 2021-06-25 ENCOUNTER — Ambulatory Visit: Payer: Medicare PPO | Admitting: Nurse Practitioner

## 2021-06-25 ENCOUNTER — Encounter: Payer: Self-pay | Admitting: Nurse Practitioner

## 2021-06-25 ENCOUNTER — Other Ambulatory Visit: Payer: Self-pay

## 2021-06-25 VITALS — BP 122/73 | HR 53 | Temp 98.3°F | Wt 301.4 lb

## 2021-06-25 DIAGNOSIS — E785 Hyperlipidemia, unspecified: Secondary | ICD-10-CM

## 2021-06-25 DIAGNOSIS — Z6841 Body Mass Index (BMI) 40.0 and over, adult: Secondary | ICD-10-CM

## 2021-06-25 DIAGNOSIS — N1832 Chronic kidney disease, stage 3b: Secondary | ICD-10-CM

## 2021-06-25 DIAGNOSIS — E1159 Type 2 diabetes mellitus with other circulatory complications: Secondary | ICD-10-CM | POA: Diagnosis not present

## 2021-06-25 DIAGNOSIS — E119 Type 2 diabetes mellitus without complications: Secondary | ICD-10-CM

## 2021-06-25 DIAGNOSIS — E1169 Type 2 diabetes mellitus with other specified complication: Secondary | ICD-10-CM | POA: Diagnosis not present

## 2021-06-25 DIAGNOSIS — I152 Hypertension secondary to endocrine disorders: Secondary | ICD-10-CM

## 2021-06-25 DIAGNOSIS — E1142 Type 2 diabetes mellitus with diabetic polyneuropathy: Secondary | ICD-10-CM | POA: Diagnosis not present

## 2021-06-25 NOTE — Progress Notes (Signed)
? ?BP 122/73 (BP Location: Left Arm, Cuff Size: Large)   Pulse (!) 53   Temp 98.3 ?F (36.8 ?C) (Oral)   Wt (!) 301 lb 6.4 oz (136.7 kg)   SpO2 96%   BMI 55.13 kg/m?   ? ?Subjective:  ? ? Patient ID: Anna Juarez, female    DOB: 10/28/1950, 71 y.o.   MRN: [id number] ? ?HPI: ?Etsuko ARYONA SILL is a 71 y.o. female ? ?Chief Complaint  ?Patient presents with  ? Diabetes  ? Hyperlipidemia  ? Hypertension  ? ?DIABETES ?Hypoglycemic episodes: no ?Polydipsia/polyuria: no ?Visual disturbance: no ?Chest pain: no ?Paresthesias: no ?Glucose Monitoring: yes ? Accucheck frequency: Daily ? Fasting glucose:  ? Post prandial: ? Evening: ? Before meals: ?Taking Insulin?: no ? Long acting insulin: ? Short acting insulin: ?Blood Pressure Monitoring: not checking ?Retinal Examination: Up to Date ?Foot Exam: Not up to Date ?Diabetic Education: Not Completed ?Pneumovax: Up to Date ?Influenza: Not up to Date ?Aspirin: no ? ?HYPERTENSION / HYPERLIPIDEMIA ?Satisfied with current treatment? no ?Duration of hypertension: years ?BP monitoring frequency: not checking ?BP range:  ?BP medication side effects: no ?Past BP meds: benazepril ?Duration of hyperlipidemia: years ?Cholesterol medication side effects: no ?Cholesterol supplements: none ?Past cholesterol medications: lovastatin ?Medication compliance: excellent compliance ?Aspirin: no ?Recent stressors: no ?Recurrent headaches: no ?Visual changes: no ?Palpitations: no ?Dyspnea: no ?Chest pain: no ?Lower extremity edema: no ?Dizzy/lightheaded: no ? ?CHRONIC KIDNEY DISEASE ?CKD status: controlled ?Medications renally dose: yes ?Previous renal evaluation: yes ?Pneumovax:  Up to Date ?Influenza Vaccine:  Not up to Date ? ?STOMACH PAIN ?Patient states her stomach still hurts when she eats.  States when she doesn't eat it doesn't hurt.  States that it is a different hurt from before she had her surgery. Denies a burning sensation.  Epigastric.  She has taken Rolaids which hasn't helped her  symptoms.  She does have some constipation.  She has been taking stool softeners which has helped.  She does have a BM everyday.   ? ? ?Relevant past medical, surgical, family and social history reviewed and updated as indicated. Interim medical history since our last visit reviewed. ?Allergies and medications reviewed and updated. ? ?Review of Systems  ?Eyes:  Negative for visual disturbance.  ?Respiratory:  Negative for chest tightness and shortness of breath.   ?Cardiovascular:  Negative for chest pain, palpitations and leg swelling.  ?Gastrointestinal:  Positive for abdominal pain and constipation.  ?Endocrine: Negative for polydipsia and polyuria.  ?Musculoskeletal:   ?     Hip pain  ?Neurological:  Negative for dizziness, light-headedness, numbness and headaches.  ?Psychiatric/Behavioral:  Negative for dysphoric mood and sleep disturbance.   ? ?Per HPI unless specifically indicated above ? ?   ?Objective:  ?  ?BP 122/73 (BP Location: Left Arm, Cuff Size: Large)   Pulse (!) 53   Temp 98.3 ?F (36.8 ?C) (Oral)   Wt (!) 301 lb 6.4 oz (136.7 kg)   SpO2 96%   BMI 55.13 kg/m?   ?Wt Readings from Last 3 Encounters:  ?06/25/21 (!) 301 lb 6.4 oz (136.7 kg)  ?06/16/21 (!) 302 lb (137 kg)  ?06/10/21 (!) 302 lb 12.8 oz (137.3 kg)  ?  ?Physical Exam ?Vitals and nursing note reviewed.  ?Constitutional:   ?   General: She is not in acute distress. ?   Appearance: Normal appearance. She is obese. She is not ill-appearing, toxic-appearing or diaphoretic.  ?HENT:  ?   Head: Normocephalic.  ?  Right Ear: External ear normal.  ?   Left Ear: External ear normal.  ?   Nose: Nose normal.  ?   Mouth/Throat:  ?   Mouth: Mucous membranes are moist.  ?   Pharynx: Oropharynx is clear.  ?Eyes:  ?   General:     ?   Right eye: No discharge.     ?   Left eye: No discharge.  ?   Extraocular Movements: Extraocular movements intact.  ?   Conjunctiva/sclera: Conjunctivae normal.  ?   Pupils: Pupils are equal, round, and reactive to light.   ?Cardiovascular:  ?   Rate and Rhythm: Normal rate and regular rhythm.  ?   Heart sounds: No murmur heard. ?Pulmonary:  ?   Effort: Pulmonary effort is normal. No respiratory distress.  ?   Breath sounds: Normal breath sounds. No wheezing or rales.  ?Abdominal:  ?   Tenderness: There is abdominal tenderness in the right upper quadrant. There is no right CVA tenderness or guarding. Negative signs include Murphy's sign.  ?Musculoskeletal:  ?   Cervical back: Normal range of motion and neck supple.  ?Skin: ?   General: Skin is warm and dry.  ?   Capillary Refill: Capillary refill takes less than 2 seconds.  ?Neurological:  ?   General: No focal deficit present.  ?   Mental Status: She is alert and oriented to person, place, and time. Mental status is at baseline.  ?Psychiatric:     ?   Mood and Affect: Mood normal.     ?   Behavior: Behavior normal.     ?   Thought Content: Thought content normal.     ?   Judgment: Judgment normal.  ?   Comments: tearful  ? ? ?Results for orders placed or performed during the hospital encounter of 06/16/21  ?Glucose, capillary  ?Result Value Ref Range  ? Glucose-Capillary 143 (H) 70 - 99 mg/dL  ?Glucose, capillary  ?Result Value Ref Range  ? Glucose-Capillary 139 (H) 70 - 99 mg/dL  ? ?   ?Assessment & Plan:  ? ?Problem List Items Addressed This Visit   ? ?  ? Cardiovascular and Mediastinum  ? Hypertension associated with diabetes ([place]) - Primary  ?  Chronic.  Controlled.  Continue with current medication regimen of Benzapril 85m daily.  Labs ordered today.  Return to clinic in 3 months for reevaluation.  Call sooner if concerns arise.  ? ?  ?  ? Relevant Orders  ? Comp Met (CMET)  ?  ? Endocrine  ? DM type 2 with diabetic peripheral neuropathy ([place]  ?  Chronic.  Controlled.  Continue with current medication regimen.  Labs ordered today.  Return to clinic in 3 months for reevaluation.  Call sooner if concerns arise.  ? ?  ?  ? Relevant Orders  ? HgB A1c  ? Hyperlipidemia associated  with type 2 diabetes mellitus ([place]  ?  Chronic.  Controlled.  Continue with current medication regimen on Lovastatin daily.  Labs ordered today.  Return to clinic in 3 months for reevaluation.  Call sooner if concerns arise.  ? ?  ?  ? Relevant Orders  ? Lipid Profile  ? Diabetes mellitus without complication ([place]  ?  Chronic.  Controlled.  A1c is 7.1.  Continue with current medication regimen.  Labs ordered today.  Return to clinic in 3 months for reevaluation.  Call sooner if concerns arise.  ? ?  ?  ?  ?  Genitourinary  ? CKD (chronic kidney disease) stage 3, GFR 30-59 ml/min (HCC)  ?  Chronic.  Stable.  Labs ordered today. Followed by Dr. [name]. Will make recommendations based on lab results. ?  ?  ?  ? Other  ? BMI 50.0-59.9, adult (HCC)  ?  Recommend a diet low in fat and carbs.  Recommend smaller meals that prioritize protein. ?  ?  ?  ? ?Follow up plan: ?Return in about 3 months (around 09/25/2021) for HTN, HLD, DM2 FU. ? ? ? ? ? ? ?

## 2021-06-25 NOTE — Assessment & Plan Note (Signed)
Chronic.  Controlled.  Continue with current medication regimen of Benzapril 40mg daily.  Labs ordered today.  Return to clinic in 3 months for reevaluation.  Call sooner if concerns arise.   

## 2021-06-25 NOTE — Assessment & Plan Note (Signed)
Chronic.  Controlled.  Continue with current medication regimen on Lovastatin daily.  Labs ordered today.  Return to clinic in 3 months for reevaluation.  Call sooner if concerns arise.  ? ?

## 2021-06-25 NOTE — Assessment & Plan Note (Signed)
Chronic.  Controlled.  Continue with current medication regimen.  Labs ordered today.  Return to clinic in 3 months for reevaluation.  Call sooner if concerns arise.   

## 2021-06-25 NOTE — Assessment & Plan Note (Signed)
Chronic.  Controlled.  A1c is 7.1.  Continue with current medication regimen.  Labs ordered today.  Return to clinic in 3 months for reevaluation.  Call sooner if concerns arise.  ? ?

## 2021-06-25 NOTE — Assessment & Plan Note (Signed)
Chronic.  Stable.  Labs ordered today. Followed by Dr. Holley Raring. Will make recommendations based on lab results. ?

## 2021-06-25 NOTE — Assessment & Plan Note (Signed)
Recommend a diet low in fat and carbs.  Recommend smaller meals that prioritize protein. ?

## 2021-06-26 LAB — COMPREHENSIVE METABOLIC PANEL
ALT: 12 IU/L (ref 0–32)
AST: 24 IU/L (ref 0–40)
Albumin/Globulin Ratio: 1.5 (ref 1.2–2.2)
Albumin: 4.3 g/dL (ref 3.7–4.7)
Alkaline Phosphatase: 67 IU/L (ref 44–121)
BUN/Creatinine Ratio: 11 — ABNORMAL LOW (ref 12–28)
BUN: 17 mg/dL (ref 8–27)
Bilirubin Total: 0.5 mg/dL (ref 0.0–1.2)
CO2: 24 mmol/L (ref 20–29)
Calcium: 9.3 mg/dL (ref 8.7–10.3)
Chloride: 99 mmol/L (ref 96–106)
Creatinine, Ser: 1.5 mg/dL — ABNORMAL HIGH (ref 0.57–1.00)
Globulin, Total: 2.9 g/dL (ref 1.5–4.5)
Glucose: 190 mg/dL — ABNORMAL HIGH (ref 70–99)
Potassium: 4.9 mmol/L (ref 3.5–5.2)
Sodium: 139 mmol/L (ref 134–144)
Total Protein: 7.2 g/dL (ref 6.0–8.5)
eGFR: 37 mL/min/{1.73_m2} — ABNORMAL LOW (ref >59)

## 2021-06-26 LAB — LIPID PANEL
Chol/HDL Ratio: 2.9 ratio (ref 0.0–4.4)
Cholesterol, Total: 160 mg/dL (ref 100–199)
HDL: 56 mg/dL (ref >39)
LDL Chol Calc (NIH): 76 mg/dL (ref 0–99)
Triglycerides: 167 mg/dL — ABNORMAL HIGH (ref 0–149)
VLDL Cholesterol Cal: 28 mg/dL (ref 5–40)

## 2021-06-26 LAB — HEMOGLOBIN A1C
Est. average glucose Bld gHb Est-mCnc: 151 mg/dL
Hgb A1c MFr Bld: 6.9 % — ABNORMAL HIGH (ref 4.8–5.6)

## 2021-06-26 NOTE — Progress Notes (Signed)
Please let patient know that her lab work looks good.  Kidney function remains stable.  A1c improved to 6.9 which is great news.  Keep up the good work.  I iwll see her at our next visit.

## 2021-07-17 ENCOUNTER — Other Ambulatory Visit: Payer: Self-pay | Admitting: Nurse Practitioner

## 2021-07-17 DIAGNOSIS — E1142 Type 2 diabetes mellitus with diabetic polyneuropathy: Secondary | ICD-10-CM

## 2021-07-17 DIAGNOSIS — I10 Essential (primary) hypertension: Secondary | ICD-10-CM

## 2021-07-17 NOTE — Telephone Encounter (Signed)
Requested medication (s) are due for refill today: Yes ? ?Requested medication (s) are on the active medication list: Yes ? ?Last refill:  01/20/21 ? ?Future visit scheduled: Yes ? ?Notes to clinic:  See request ? ? ? ?Requested Prescriptions  ?Pending Prescriptions Disp Refills  ? pregabalin (LYRICA) 150 MG capsule [Pharmacy Med Name: pregabalin 150 mg capsule] 270 capsule 1  ?  Sig: TAKE ONE CAPSULE BY MOUTH THREE TIMES DAILY  ?  ? Not Delegated - Neurology:  Anticonvulsants - Controlled - pregabalin Failed - 07/17/2021  8:03 AM  ?  ?  Failed - This refill cannot be delegated  ?  ?  Failed - Cr in normal range and within 360 days  ?  Creatinine, Ser  ?Date Value Ref Range Status  ?06/25/2021 1.50 (H) 0.57 - 1.00 mg/dL Final  ?  ?  ?  ?  Passed - Completed PHQ-2 or PHQ-9 in the last 360 days  ?  ?  Passed - Valid encounter within last 12 months  ?  Recent Outpatient Visits   ? ?      ? 3 weeks ago Hypertension associated with diabetes ([place])  ? [place], NP  ? 1 month ago BRBPR (bright red blood per rectum)  ? [facility] Vigg, [place], MD  ? 2 months ago Acute cystitis without hematuria  ? [place], NP  ? 3 months ago Hypertension associated with diabetes [facility])  ? [place], NP  ? 4 months ago Acute cystitis without hematuria  ? [place], [place] P, DO  ? ?  ?  ?Future Appointments   ? ?        ? In 2 weeks Lucilla Lame, MD [place]  ? In 2 months [name], NP [facility]  ? ?  ? ?  ?  ?  ?Signed Prescriptions Disp Refills  ? benazepril (LOTENSIN) 40 MG tablet 90 tablet 1  ?  Sig: TAKE ONE TABLET BY MOUTH ONCE DAILY  ?  ? Cardiovascular:  ACE Inhibitors Failed - 07/17/2021  8:03 AM  ?  ?  Failed - Cr in normal range and within 180 days  ?  Creatinine, Ser  ?Date Value Ref Range Status  ?06/25/2021 1.50 (H) 0.57 - 1.00 mg/dL Final  ?  ?  ?  ?   Passed - K in normal range and within 180 days  ?  Potassium  ?Date Value Ref Range Status  ?06/25/2021 4.9 3.5 - 5.2 mmol/L Final  ?  ?  ?  ?  Passed - Patient is not pregnant  ?  ?  Passed - Last BP in normal range  ?  BP Readings from Last 1 Encounters:  ?06/25/21 122/73  ?  ?  ?  ?  Passed - Valid encounter within last 6 months  ?  Recent Outpatient Visits   ? ?      ? 3 weeks ago Hypertension associated with diabetes ([place])  ? [place], NP  ? 1 month ago BRBPR (bright red blood per rectum)  ? [facility] Vigg, Avanti, MD  ? 2 months ago Acute cystitis without hematuria  ? [place], NP  ? 3 months ago Hypertension associated with diabetes [facility])  ? [place], NP  ? 4 months ago Acute cystitis without hematuria  ? [place]  [place] P, DO  ? ?  ?  ?Future Appointments   ? ?        ? In 2 weeks Lucilla Lame, MD [place]  ? In 2 months [name], NP [facility]  ? ?  ? ?  ?  ?  ? ?

## 2021-07-21 ENCOUNTER — Telehealth: Payer: Self-pay

## 2021-07-21 NOTE — Chronic Care Management (AMB) (Signed)
? ? ?  Chronic Care Management ?Pharmacy Assistant  ? ?Name: Anna Juarez  MRN: [id number] DOB: 1950/08/23 ? ?Reason for Encounter: Medication Review ?  ? ?Medications: ?Outpatient Encounter Medications as of 07/21/2021  ?Medication Sig  ? acetaminophen (TYLENOL) 500 MG tablet Take 2 tablets (1,000 mg total) by mouth every 6 (six) hours as needed for mild pain.  ? Ascorbic Acid (VITAMIN C) 1000 MG tablet Take 1,000 mg by mouth daily.  ? benazepril (LOTENSIN) 40 MG tablet TAKE ONE TABLET BY MOUTH ONCE DAILY  ? dapagliflozin propanediol (FARXIGA) 10 MG TABS tablet Take 1 tablet (10 mg total) by mouth daily before breakfast.  ? glipiZIDE (GLUCOTROL) 5 MG tablet TAKE ONE TABLET BY MOUTH TWICE DAILY BEFORE A meal  ? glucose blood test strip Use as instructed  ? hydrocortisone 2.5 % cream Apply topically 2 (two) times daily.  ? lovastatin (MEVACOR) 40 MG tablet TAKE ONE TABLET BY MOUTH EVERYDAY AT BEDTIME  ? metoprolol succinate (TOPROL-XL) 50 MG 24 hr tablet TAKE ONE TABLET BY MOUTH ONCE DAILY with A meal  ? pantoprazole (PROTONIX) 20 MG tablet TAKE 1 TABLET BY MOUTH EVERY DAY  ? pregabalin (LYRICA) 150 MG capsule TAKE ONE CAPSULE BY MOUTH THREE TIMES DAILY  ? traMADol (ULTRAM) 50 MG tablet TAKE 1 TABLET BY MOUTH EVERY 8 HOURS AS NEEDED.  ? traZODone (DESYREL) 100 MG tablet TAKE 1 TABLET BY MOUTH AT BEDTIME AS NEEDED FOR SLEEP.  ? ?No facility-administered encounter medications on file as of 07/21/2021.  ? ? ?Reviewed chart for medication changes ahead of medication coordination call. ? ?No OVs, Consults, or hospital visits since last care coordination call/Pharmacist visit. (If appropriate, list visit date, provider name) ? ?No medication changes indicated OR if recent visit, treatment plan here. ? ?BP Readings from Last 3 Encounters:  ?06/25/21 122/73  ?06/16/21 140/63  ?06/10/21 107/62  ?  ?Lab Results  ?Component Value Date  ? HGBA1C 6.9 (H) 06/25/2021  ?  ? ?Reviewed chart for medication changes ahead of medication  coordination call. ? ?No OVs, Consults, or hospital visits since last care coordination call/Pharmacist visit. (If appropriate, list visit date, provider name) ? ?No medication changes indicated OR if recent visit, treatment plan here. ? ?BP Readings from Last 3 Encounters:  ?06/25/21 122/73  ?06/16/21 140/63  ?06/10/21 107/62  ?  ?Lab Results  ?Component Value Date  ? HGBA1C 6.9 (H) 06/25/2021  ?  ? ?Patient obtains medications through Adherence Packaging  90 Days  ? ?Last adherence delivery included:  N/a ? ?Patient declined (meds) last month due to PRN use/additional supply on hand. N/a ? ?Patient is due for next adherence delivery on:07/31/21  ? ?Called patient and reviewed medications and coordinated delivery. ? ?This delivery to include: ?PreviDent 5000 1.1% dental paste added to med sync list ?Lovastatin 40 mg once at bedtime ?Metoprolol 50 mg once daily ?Glipizide 5 mg twice daily ?Benazepril 40 mg once daily ?Pregablin 150 mg three times daily ? ?Patient will need a short fill of (med), prior to adherence delivery.  N/a ? ?Patient needs refills for: PreviDent 5000 1.1% dental paste added to med sync list ?Lovastatin 40 mg once at bedtime ?Metoprolol 50 mg once daily ?Glipizide 5 mg twice daily ?Benazepril 40 mg once daily ?Pregablin 150 mg three times daily ? ?Confirmed delivery date of 07/31/21, advised patient that pharmacy will contact them the morning of delivery.  ? ?Corrie Mckusick, RMA ?Health Concierge ? ?

## 2021-08-04 ENCOUNTER — Other Ambulatory Visit: Payer: Self-pay | Admitting: Nurse Practitioner

## 2021-08-04 DIAGNOSIS — M545 Low back pain, unspecified: Secondary | ICD-10-CM

## 2021-08-05 ENCOUNTER — Ambulatory Visit: Payer: Medicare PPO | Admitting: Gastroenterology

## 2021-08-05 ENCOUNTER — Encounter: Payer: Self-pay | Admitting: Gastroenterology

## 2021-08-05 VITALS — BP 155/87 | HR 56 | Temp 98.1°F | Ht 62.0 in | Wt 300.0 lb

## 2021-08-05 DIAGNOSIS — R1084 Generalized abdominal pain: Secondary | ICD-10-CM | POA: Diagnosis not present

## 2021-08-05 MED ORDER — PEG 3350-KCL-NA BICARB-NACL 420 G PO SOLR: 4000.0000 mL | Freq: Once | ORAL | 0 refills | Status: AC

## 2021-08-05 NOTE — Telephone Encounter (Signed)
Requested medication (s) are due for refill today - provider review  ? ?Requested medication (s) are on the active medication list -yes ? ?Future visit scheduled -yes ? ?Last refill: 06/23/21 #30 1 RF ? ?Notes to clinic: Request RF: non delegated Rx ? ?Requested Prescriptions  ?Pending Prescriptions Disp Refills  ? traMADol (ULTRAM) 50 MG tablet [Pharmacy Med Name: TRAMADOL HCL 50 MG TABLET] 30 tablet 1  ?  Sig: TAKE 1 TABLET BY MOUTH EVERY 8 HOURS AS NEEDED.  ?  ? Not Delegated - Analgesics:  Opioid Agonists Failed - 08/04/2021 10:45 AM  ?  ?  Failed - This refill cannot be delegated  ?  ?  Failed - Urine Drug Screen completed in last 360 days  ?  ?  Passed - Valid encounter within last 3 months  ?  Recent Outpatient Visits   ? ?      ? 1 month ago Hypertension associated with diabetes ([place])  ? [place], NP  ? 1 month ago BRBPR (bright red blood per rectum)  ? [facility] Vigg, Avanti, MD  ? 3 months ago Acute cystitis without hematuria  ? [place], NP  ? 4 months ago Hypertension associated with diabetes [facility])  ? [place], NP  ? 5 months ago Acute cystitis without hematuria  ? [place], [place] P, DO  ? ?  ?  ?Future Appointments   ? ?        ? Today Lucilla Lame, MD [place]  ? In 1 month [name], NP [facility]  ? ?  ? ? ?  ?  ?  ? ? ? ?Requested Prescriptions  ?Pending Prescriptions Disp Refills  ? traMADol (ULTRAM) 50 MG tablet [Pharmacy Med Name: TRAMADOL HCL 50 MG TABLET] 30 tablet 1  ?  Sig: TAKE 1 TABLET BY MOUTH EVERY 8 HOURS AS NEEDED.  ?  ? Not Delegated - Analgesics:  Opioid Agonists Failed - 08/04/2021 10:45 AM  ?  ?  Failed - This refill cannot be delegated  ?  ?  Failed - Urine Drug Screen completed in last 360 days  ?  ?  Passed - Valid encounter within last 3 months  ?  Recent Outpatient Visits   ? ?      ? 1 month ago  Hypertension associated with diabetes ([place])  ? [place], NP  ? 1 month ago BRBPR (bright red blood per rectum)  ? [facility] Vigg, Avanti, MD  ? 3 months ago Acute cystitis without hematuria  ? [place], NP  ? 4 months ago Hypertension associated with diabetes [facility])  ? [place], NP  ? 5 months ago Acute cystitis without hematuria  ? [place], [place] P, DO  ? ?  ?  ?Future Appointments   ? ?        ? Today Lucilla Lame, MD [place]  ? In 1 month [name], NP [facility], [facility]  ? ?  ? ? ?  ?  ?  ? ? ? ?

## 2021-08-05 NOTE — Progress Notes (Addendum)
Primary Care Physician: [name], NP  Primary Gastroenterologist:  Dr. Lucilla Lame  Chief Complaint  Patient presents with  . New Patient (Initial Visit)    Transfer from Dr Bonna Gains Pt reports hemorrhoid Sx have improved  . Abdominal Pain    Epigastric, especially after eating since having gall bladder removed     HPI: Anna Juarez is a 71 y.o. female here who had seen my partner the past before she left the practice and now comes to see me. Prior to see my partners she is seeing me back in 2015 with an EGD that showed  EGD 2015: . STOMACH, ANTRUM AND BODY; BIOPSY:  - MILD CHRONIC INACTIVE GASTRITIS.  - NO INTESTINAL METAPLASIA, DYSPLASIA, OR MALIGNANCY.  - NO HELICOBACTER PYLORI SEEN ON HEMATOXYLIN AND EOSIN SECTIONS.   The patient reports that she was diagnosed with gallstones and had her gallbladder removed but now comes with pain that she says is different from her gallbladder pain reports the pain to be in the left upper quadrant and epigastric area.  She states that also goes down to the middle part of her abdomen just above her umbilicus.  She reports that eating makes it worse and also drinking water can also exacerbate it.  She reports that her disease you to eat for she goes to sleep.  She notices that bee and chicken make her feel like she wants to throw up but states that it is mostly from the smell. She also says that she has a weak stomach and other things will make her nauseous at times.  She does report that she was due for colonoscopy but due to her husband being ill she never proceeded with it and he has since passed away.   Past Medical History:  Diagnosis Date  . Arthritis   . Chronic kidney disease   . Complication of anesthesia   . Diabetes mellitus without complication ([place])   . Extreme obesity   . Fatty liver   . GERD (gastroesophageal reflux disease)   . Hyperlipidemia   . Hypertension   . Palpitations   . PONV (postoperative nausea and  vomiting)   . Radiculopathy, cervical   . Renal insufficiency    stage 3  . Sleep apnea    uses cpap  . Tear of acetabular labrum 01/2021   right    Current Outpatient Medications  Medication Sig Dispense Refill  . acetaminophen (TYLENOL) 500 MG tablet Take 2 tablets (1,000 mg total) by mouth every 6 (six) hours as needed for mild pain.    . Ascorbic Acid (VITAMIN C) 1000 MG tablet Take 1,000 mg by mouth daily.    . benazepril (LOTENSIN) 40 MG tablet TAKE ONE TABLET BY MOUTH ONCE DAILY 90 tablet 1  . dapagliflozin propanediol (FARXIGA) 10 MG TABS tablet Take 1 tablet (10 mg total) by mouth daily before breakfast. 90 tablet 1  . glipiZIDE (GLUCOTROL) 5 MG tablet TAKE ONE TABLET BY MOUTH TWICE DAILY BEFORE A meal 180 tablet 1  . glucose blood test strip Use as instructed 100 each 12  . hydrocortisone 2.5 % cream Apply topically 2 (two) times daily. 30 g 2  . lovastatin (MEVACOR) 40 MG tablet TAKE ONE TABLET BY MOUTH EVERYDAY AT BEDTIME 90 tablet 1  . metoprolol succinate (TOPROL-XL) 50 MG 24 hr tablet TAKE ONE TABLET BY MOUTH ONCE DAILY with A meal 90 tablet 1  . pantoprazole (PROTONIX) 20 MG tablet TAKE 1 TABLET BY MOUTH EVERY  DAY 90 tablet 1  . pregabalin (LYRICA) 150 MG capsule TAKE ONE CAPSULE BY MOUTH THREE TIMES DAILY 270 capsule 0  . traMADol (ULTRAM) 50 MG tablet TAKE 1 TABLET BY MOUTH EVERY 8 HOURS AS NEEDED 30 tablet 1  . traZODone (DESYREL) 100 MG tablet TAKE 1 TABLET BY MOUTH AT BEDTIME AS NEEDED FOR SLEEP. 90 tablet 1  . polyethylene glycol-electrolytes (NULYTELY) 420 g solution Take 4,000 mLs by mouth once for 1 dose. 4000 mL 0   No current facility-administered medications for this visit.    Allergies as of 08/05/2021 - Review Complete 08/05/2021  Allergen Reaction Noted  . Actos [pioglitazone] Nausea And Vomiting 10/02/2014  . Metformin and related Diarrhea 10/02/2014  . Scopolamine Nausea And Vomiting 01/31/2021  . Victoza [liraglutide] Nausea And Vomiting 10/02/2014     ROS:  General: Negative for anorexia, weight loss, fever, chills, fatigue, weakness. ENT: Negative for hoarseness, difficulty swallowing , nasal congestion. CV: Negative for chest pain, angina, palpitations, dyspnea on exertion, peripheral edema.  Respiratory: Negative for dyspnea at rest, dyspnea on exertion, cough, sputum, wheezing.  GI: See history of present illness. GU:  Negative for dysuria, hematuria, urinary incontinence, urinary frequency, nocturnal urination.  Endo: Negative for unusual weight change.    Physical Examination:   BP (!) 155/87   Pulse (!) 56   Temp 98.1 F (36.7 C) (Oral)   Ht '5\' 2"'$  (1.575 m)   Wt 300 lb (136.1 kg)   BMI 54.87 kg/m   General: Well-nourished, well-developed in no acute distress.  Eyes: No icterus. Conjunctivae pink. Lungs: Clear to auscultation bilaterally. Non-labored. Heart: Regular rate and rhythm, no murmurs rubs or gallops.  Abdomen: Bowel sounds are normal, Some mild tenderness in the area just above the umbilicus., nondistended, no hepatosplenomegaly or masses, no abdominal bruits or hernia , no rebound or guarding.   Extremities: No lower extremity edema. No clubbing or deformities. Neuro: Alert and oriented x 3.  Grossly intact. Skin: Warm and dry, no jaundice.   Psych: Alert and cooperative, normal mood and affect.  Labs:    Imaging Studies: No results found.  Assessment and Plan:   Anna Juarez is a 71 y.o. y/o female who comes in with a report of abdominal pain that is new and different from her gallbladder pain. The patient has a history of gastritis and reports this to be postprandial in nature.  The patient will be set up for an EGD and colonoscopy to look for source of her abdominal pain and if this is negative she has been told that she may need a CT scan of the abdomen.  The patient has been explained the plan and agrees with it and will follow up with time procedures.     Lucilla Lame, MD. Marval Regal    Note:  This dictation was prepared with Dragon dictation along with smaller phrase technology. Any transcriptional errors that result from this process are unintentional.

## 2021-08-08 ENCOUNTER — Ambulatory Visit: Payer: Medicare PPO

## 2021-08-11 ENCOUNTER — Telehealth: Payer: Medicare PPO

## 2021-08-20 ENCOUNTER — Ambulatory Visit: Payer: Self-pay

## 2021-08-20 MED ORDER — DAPAGLIFLOZIN PROPANEDIOL 10 MG PO TABS: 10.0000 mg | ORAL_TABLET | Freq: Every day | ORAL | 0 refills | Status: DC

## 2021-08-20 NOTE — Telephone Encounter (Signed)
Summary: medication question  ? Pt called in stating she needed to speak with a nurse, she has some questions about her medication FARXIGA. Please advise.   ?  ? ? ? ?Pt stated that she was previously getting her medication for free but is  been paying for it now. Pt stated her husband died in May 11, 2022 and not sure if she is in a higher tax bracket. Pt stated she has a hard time filling the paperwork out. Would like to to talk to the pharmacist before or when she comes in for appt June 22nd.Marland Kitchen  ?Pt requesting short term (30 day) [facility]. Pt stated that she will be out of medication before the mail order gets her refill to her. Pt asking to send [facility] [place].  ?Reason for Disposition ? Patient has refills remaining on their prescription ? ?Answer Assessment - Initial Assessment Questions ?1. DRUG NAME: "What medicine do you need to have refilled?" ?    Wilder Glade ?2. REFILLS REMAINING: "How many refills are remaining?" (Note: The label on the medicine or pill bottle will show how many refills are remaining. If there are no refills remaining, then a renewal may be needed.) ?    Pt stated that she would like a 30 day supply sent to [facility] [place] as she is almost out of medicine ?3. EXPIRATION DATE: "What is the expiration date?" (Note: The label states when the prescription will expire, and thus can no longer be refilled.) ?    N/a ?4. PRESCRIBING HCP: "Who prescribed it?" Reason: If prescribed by specialist, call should be referred to that group. ?    [name] NP ?5. SYMPTOMS: "Do you have any symptoms?" ?    N/a ?6. PREGNANCY: "Is there any chance that you are pregnant?" "When was your last menstrual period?" ?    N/a ? ?Protocols used: Medication Refill and Renewal Call-A-AH ? ?

## 2021-08-21 NOTE — Telephone Encounter (Signed)
Patient says she would like a call back around 12 pm as she will be leaving for out of town and she will not have service on her cell phone until around 12 pm at [phone].

## 2021-08-21 NOTE — Telephone Encounter (Signed)
Did anyone call her and ask what her question is?

## 2021-08-28 ENCOUNTER — Other Ambulatory Visit: Payer: Self-pay | Admitting: Nurse Practitioner

## 2021-09-08 ENCOUNTER — Other Ambulatory Visit: Payer: Self-pay | Admitting: Gastroenterology

## 2021-09-08 DIAGNOSIS — R1084 Generalized abdominal pain: Secondary | ICD-10-CM

## 2021-09-09 ENCOUNTER — Encounter
Admission: RE | Disposition: A | Discharge status: Home / Self Care | Payer: Self-pay | Source: Home / Self Care | Attending: Gastroenterology

## 2021-09-09 ENCOUNTER — Ambulatory Visit: Payer: Medicare PPO | Admitting: Registered Nurse

## 2021-09-09 ENCOUNTER — Encounter: Payer: Self-pay | Admitting: Gastroenterology

## 2021-09-09 ENCOUNTER — Ambulatory Visit
Admission: RE | Admit: 2021-09-09 | Discharge: 2021-09-09 | Disposition: A | Discharge status: Home / Self Care | Payer: Medicare PPO | Attending: Gastroenterology | Admitting: Gastroenterology

## 2021-09-09 DIAGNOSIS — K760 Fatty (change of) liver, not elsewhere classified: Secondary | ICD-10-CM | POA: Diagnosis not present

## 2021-09-09 DIAGNOSIS — Z6841 Body Mass Index (BMI) 40.0 and over, adult: Secondary | ICD-10-CM | POA: Diagnosis not present

## 2021-09-09 DIAGNOSIS — K2970 Gastritis, unspecified, without bleeding: Secondary | ICD-10-CM | POA: Diagnosis not present

## 2021-09-09 DIAGNOSIS — E1122 Type 2 diabetes mellitus with diabetic chronic kidney disease: Secondary | ICD-10-CM | POA: Diagnosis not present

## 2021-09-09 DIAGNOSIS — K641 Second degree hemorrhoids: Secondary | ICD-10-CM | POA: Diagnosis not present

## 2021-09-09 DIAGNOSIS — K219 Gastro-esophageal reflux disease without esophagitis: Secondary | ICD-10-CM | POA: Diagnosis not present

## 2021-09-09 DIAGNOSIS — N183 Chronic kidney disease, stage 3 unspecified: Secondary | ICD-10-CM | POA: Diagnosis not present

## 2021-09-09 DIAGNOSIS — M1990 Unspecified osteoarthritis, unspecified site: Secondary | ICD-10-CM | POA: Insufficient documentation

## 2021-09-09 DIAGNOSIS — R1084 Generalized abdominal pain: Secondary | ICD-10-CM

## 2021-09-09 DIAGNOSIS — K635 Polyp of colon: Secondary | ICD-10-CM | POA: Diagnosis not present

## 2021-09-09 DIAGNOSIS — I129 Hypertensive chronic kidney disease with stage 1 through stage 4 chronic kidney disease, or unspecified chronic kidney disease: Secondary | ICD-10-CM | POA: Diagnosis not present

## 2021-09-09 DIAGNOSIS — G4730 Sleep apnea, unspecified: Secondary | ICD-10-CM | POA: Insufficient documentation

## 2021-09-09 DIAGNOSIS — K5730 Diverticulosis of large intestine without perforation or abscess without bleeding: Secondary | ICD-10-CM | POA: Diagnosis not present

## 2021-09-09 DIAGNOSIS — R1013 Epigastric pain: Secondary | ICD-10-CM | POA: Insufficient documentation

## 2021-09-09 DIAGNOSIS — D122 Benign neoplasm of ascending colon: Secondary | ICD-10-CM | POA: Insufficient documentation

## 2021-09-09 HISTORY — PX: COLONOSCOPY WITH PROPOFOL: SHX5780

## 2021-09-09 HISTORY — PX: ESOPHAGOGASTRODUODENOSCOPY (EGD) WITH PROPOFOL: SHX5813

## 2021-09-09 LAB — GLUCOSE, CAPILLARY: Glucose-Capillary: 142 mg/dL — ABNORMAL HIGH (ref 70–99)

## 2021-09-09 SURGERY — COLONOSCOPY WITH PROPOFOL
Anesthesia: General

## 2021-09-09 MED ORDER — PROPOFOL 10 MG/ML IV BOLUS
INTRAVENOUS | Status: DC | PRN
  Administered 2021-09-09: 70 mg via INTRAVENOUS

## 2021-09-09 MED ORDER — PROPOFOL 500 MG/50ML IV EMUL
INTRAVENOUS | Status: DC | PRN
  Administered 2021-09-09: 140 ug/kg/min via INTRAVENOUS

## 2021-09-09 MED ORDER — DEXMEDETOMIDINE (PRECEDEX) IN NS 20 MCG/5ML (4 MCG/ML) IV SYRINGE
PREFILLED_SYRINGE | INTRAVENOUS | Status: DC | PRN
  Administered 2021-09-09: 4 ug via INTRAVENOUS

## 2021-09-09 MED ORDER — LIDOCAINE HCL (CARDIAC) PF 100 MG/5ML IV SOSY
PREFILLED_SYRINGE | INTRAVENOUS | Status: DC | PRN
  Administered 2021-09-09: 80 mg via INTRAVENOUS

## 2021-09-09 MED ORDER — LIDOCAINE HCL (PF) 1 % IJ SOLN
INTRAMUSCULAR | Status: AC
  Administered 2021-09-09: 0.05 mL
  Filled 2021-09-09: qty 2

## 2021-09-09 MED ORDER — SODIUM CHLORIDE 0.9 % IV SOLN
INTRAVENOUS | Status: DC
  Administered 2021-09-09: 0.5 mL via INTRAVENOUS

## 2021-09-09 NOTE — H&P (Signed)
Lucilla Lame, MD [facility] [address], [place], [place] [address] Phone:[phone] Fax : [phone]  Primary Care Physician:  [name], NP Primary Gastroenterologist:  Dr. Allen Norris  Pre-Procedure History & Physical: HPI:  Anna Juarez is a 71 y.o. female is here for an endoscopy and colonoscopy.   Past Medical History:  Diagnosis Date   Arthritis    Chronic kidney disease    Complication of anesthesia    Diabetes mellitus without complication (HCC)    Extreme obesity    Fatty liver    GERD (gastroesophageal reflux disease)    Hyperlipidemia    Hypertension    Palpitations    PONV (postoperative nausea and vomiting)    Radiculopathy, cervical    Renal insufficiency    stage 3   Sleep apnea    uses cpap   Tear of acetabular labrum 01/2021   right    Past Surgical History:  Procedure Laterality Date   ABDOMINAL HYSTERECTOMY     CATARACT EXTRACTION W/PHACO Right 06/02/2021   Procedure: CATARACT EXTRACTION PHACO AND INTRAOCULAR LENS PLACEMENT ([place]) RIGHT DIABETIC;  Surgeon: Eulogio Bear, MD;  Location: [facility];  Service: Ophthalmology;  Laterality: Right;  2.52 0:22.8   CATARACT EXTRACTION W/PHACO Left 06/16/2021   Procedure: CATARACT EXTRACTION PHACO AND INTRAOCULAR LENS PLACEMENT ([facility]) LEFT DIABETIC 3.51 00:30.9;  Surgeon: Eulogio Bear, MD;  Location: [facility];  Service: Ophthalmology;  Laterality: Left;   CERVICAL FUSION     x2   CHOLECYSTECTOMY     COLONOSCOPY     JOINT REPLACEMENT Bilateral    knees    Prior to Admission medications   Medication Sig Start Date End Date Taking? Authorizing Provider  Ascorbic Acid (VITAMIN C) 1000 MG tablet Take 1,000 mg by mouth daily.   Yes [provider]  benazepril (LOTENSIN) 40 MG tablet TAKE ONE TABLET BY MOUTH ONCE DAILY 07/17/21  Yes [name], NP  dapagliflozin propanediol (FARXIGA) 10 MG TABS tablet Take 1 tablet (10 mg total) by mouth daily before  breakfast. 08/20/21  Yes [name], NP  glipiZIDE (GLUCOTROL) 5 MG tablet TAKE ONE TABLET BY MOUTH TWICE DAILY BEFORE A meal 04/29/21  Yes [name], NP  lovastatin (MEVACOR) 40 MG tablet TAKE ONE TABLET BY MOUTH EVERYDAY AT BEDTIME 04/29/21  Yes [name], NP  metoprolol succinate (TOPROL-XL) 50 MG 24 hr tablet TAKE ONE TABLET BY MOUTH ONCE DAILY with A meal 04/29/21  Yes [name], NP  pantoprazole (PROTONIX) 20 MG tablet TAKE 1 TABLET BY MOUTH EVERY DAY 06/12/21  Yes [name], NP  pregabalin (LYRICA) 150 MG capsule TAKE ONE CAPSULE BY MOUTH THREE TIMES DAILY 07/17/21  Yes Johnson, Megan P, DO  traZODone (DESYREL) 100 MG tablet TAKE 1 TABLET BY MOUTH AT BEDTIME AS NEEDED FOR SLEEP. 06/12/21  Yes [name], NP  acetaminophen (TYLENOL) 500 MG tablet Take 2 tablets (1,000 mg total) by mouth every 6 (six) hours as needed for mild pain. 02/06/21   Olean Ree, MD  glucose blood test strip Use as instructed 01/27/21   [name], NP  hydrocortisone 2.5 % cream Apply topically 2 (two) times daily. 06/10/21   Vigg, Avanti, MD  traMADol (ULTRAM) 50 MG tablet TAKE 1 TABLET BY MOUTH EVERY 8 HOURS AS NEEDED 08/05/21   [name], NP    Allergies as of 09/08/2021 - Review Complete 08/05/2021  Allergen Reaction Noted   Actos [pioglitazone] Nausea And Vomiting 10/02/2014   Metformin and related  Diarrhea 10/02/2014   Scopolamine Nausea And Vomiting 01/31/2021   Victoza [liraglutide] Nausea And Vomiting 10/02/2014    Family History  Problem Relation Age of Onset   Diabetes Mother    Heart disease Mother    Hypertension Mother    Heart disease Father    Cancer Brother    Hypertension Brother    Breast cancer Maternal Aunt    Breast cancer Paternal Aunt     Social History   Socioeconomic History   Marital status: Married    Spouse name: Not on file   Number of children: Not on file   Years of education: Not on file   Highest education level:  12th grade  Occupational History   Occupation: retired   Tobacco Use   Smoking status: Never    Passive exposure: Never   Smokeless tobacco: Never  Vaping Use   Vaping Use: Never used  Substance and Sexual Activity   Alcohol use: No   Drug use: No   Sexual activity: Not Currently  Other Topics Concern   Not on file  Social History Narrative   [place]; lives with husband; never smoker; no alcohol; retd. Manufacturing.    Social Determinants of Health   Financial Resource Strain: Not on file  Food Insecurity: Not on file  Transportation Needs: Not on file  Physical Activity: Not on file  Stress: Not on file  Social Connections: Not on file  Intimate Partner Violence: Not on file    Review of Systems: See HPI, otherwise negative ROS  Physical Exam: BP (!) 136/49   Pulse (!) 51   Temp (!) 96.6 F (35.9 C) (Temporal)   Resp 20   Ht '5\' 2"'$  (1.575 m)   Wt 133.8 kg   BMI 53.96 kg/m  General:   Alert,  pleasant and cooperative in NAD Head:  Normocephalic and atraumatic. Neck:  Supple; no masses or thyromegaly. Lungs:  Clear throughout to auscultation.    Heart:  Regular rate and rhythm. Abdomen:  Soft, nontender and nondistended. Normal bowel sounds, without guarding, and without rebound.   Neurologic:  Alert and  oriented x4;  grossly normal neurologically.  Impression/Plan: Anna Juarez is here for an endoscopy and colonoscopy to be performed for abdominal pain  Risks, benefits, limitations, and alternatives regarding  endoscopy and colonoscopy have been reviewed with the patient.  Questions have been answered.  All parties agreeable.   Lucilla Lame, MD  09/09/2021, 11:09 AM

## 2021-09-09 NOTE — Op Note (Signed)
[facility] Gastroenterology Patient Name: Anna Juarez Procedure Date: 09/09/2021 11:00 AM MRN: [id number] Account #: [id number] Date of Birth: 08/04/1950 Admit Type: Outpatient Age: 71 Room: [facility] [place] ENDO ROOM 4 Gender: Female Note Status: Finalized Instrument Name: Jasper Riling [id number] Procedure:             Colonoscopy Indications:           Generalized abdominal pain Providers:             Lucilla Lame MD, MD Referring MD:          [name] (Referring MD) Medicines:             Propofol per Anesthesia Complications:         No immediate complications. Procedure:             Pre-Anesthesia Assessment:                        - Prior to the procedure, a History and Physical was                         performed, and patient medications and allergies were                         reviewed. The patient's tolerance of previous                         anesthesia was also reviewed. The risks and benefits                         of the procedure and the sedation options and risks                         were discussed with the patient. All questions were                         answered, and informed consent was obtained. Prior                         Anticoagulants: The patient has taken no previous                         anticoagulant or antiplatelet agents. ASA Grade                         Assessment: II - A patient with mild systemic disease.                         After reviewing the risks and benefits, the patient                         was deemed in satisfactory condition to undergo the                         procedure.                        After obtaining informed consent, the colonoscope was  passed under direct vision. Throughout the procedure,                         the patient's blood pressure, pulse, and oxygen                         saturations were monitored continuously. The                         Colonoscope was  introduced through the anus and                         advanced to the the cecum, identified by appendiceal                         orifice and ileocecal valve. The colonoscopy was                         performed without difficulty. The patient tolerated                         the procedure well. The quality of the bowel                         preparation was good. Findings:      The perianal and digital rectal examinations were normal.      A 4 mm polyp was found in the ascending colon. The polyp was sessile.       The polyp was removed with a cold snare. Resection and retrieval were       complete.      Multiple small-mouthed diverticula were found in the sigmoid colon.      Non-bleeding internal hemorrhoids were found during retroflexion. The       hemorrhoids were Grade II (internal hemorrhoids that prolapse but reduce       spontaneously). Impression:            - One 4 mm polyp in the ascending colon, removed with                         a cold snare. Resected and retrieved.                        - Diverticulosis in the sigmoid colon.                        - Non-bleeding internal hemorrhoids. Recommendation:        - Discharge patient to home.                        - Resume previous diet.                        - Continue present medications.                        - Await pathology results.                        - Repeat colonoscopy is not recommended for  surveillance.                        - Perform CT scan (computed tomography) of the abdomen                         with contrast. Procedure Code(s):     --- Professional ---                        [phone], Colonoscopy, flexible; with removal of                         tumor(s), polyp(s), or other lesion(s) by snare                         technique Diagnosis Code(s):     --- Professional ---                        R10.84, Generalized abdominal pain                        K63.5, Polyp of  colon CPT copyright 2019 American Medical Association. All rights reserved. The codes documented in this report are preliminary and upon coder review may  be revised to meet current compliance requirements. Lucilla Lame MD, MD 09/09/2021 12:09:15 PM This report has been signed electronically. Number of Addenda: 0 Note Initiated On: 09/09/2021 11:00 AM Scope Withdrawal Time: 0 hours 6 minutes 23 seconds  Total Procedure Duration: 0 hours 9 minutes 44 seconds  Estimated Blood Loss:  Estimated blood loss: none.      [facility]

## 2021-09-09 NOTE — Anesthesia Preprocedure Evaluation (Signed)
Anesthesia Evaluation  Patient identified by MRN, date of birth, ID band Patient awake    Reviewed: Allergy & Precautions, H&P , NPO status , Patient's Chart, lab work & pertinent test results, reviewed documented beta blocker date and time   History of Anesthesia Complications (+) PONV and history of anesthetic complications  Airway Mallampati: II   Neck ROM: full    Dental  (+) Poor Dentition   Pulmonary neg pulmonary ROS, sleep apnea and Continuous Positive Airway Pressure Ventilation ,    Pulmonary exam normal        Cardiovascular Exercise Tolerance: Poor hypertension, On Medications negative cardio ROS Normal cardiovascular exam Rhythm:regular Rate:Normal     Neuro/Psych  Neuromuscular disease negative neurological ROS  negative psych ROS   GI/Hepatic negative GI ROS, Neg liver ROS, GERD  Medicated,  Endo/Other  negative endocrine ROSdiabetes  Renal/GU Renal diseasenegative Renal ROS  negative genitourinary   Musculoskeletal   Abdominal   Peds  Hematology negative hematology ROS (+)   Anesthesia Other Findings Past Medical History: No date: Arthritis No date: Chronic kidney disease No date: Complication of anesthesia No date: Diabetes mellitus without complication (HCC) No date: Extreme obesity No date: Fatty liver No date: GERD (gastroesophageal reflux disease) No date: Hyperlipidemia No date: Hypertension No date: Palpitations No date: PONV (postoperative nausea and vomiting) No date: Radiculopathy, cervical No date: Renal insufficiency     Comment:  stage 3 No date: Sleep apnea     Comment:  uses cpap 01/2021: Tear of acetabular labrum     Comment:  right Past Surgical History: No date: ABDOMINAL HYSTERECTOMY 06/02/2021: CATARACT EXTRACTION W/PHACO; Right     Comment:  Procedure: CATARACT EXTRACTION PHACO AND INTRAOCULAR               LENS PLACEMENT (IOC) RIGHT DIABETIC;  Surgeon: Eulogio Bear, MD;  Location: [place];                Service: Ophthalmology;  Laterality: Right;  2.52 0:22.8 06/16/2021: CATARACT EXTRACTION W/PHACO; Left     Comment:  Procedure: CATARACT EXTRACTION PHACO AND INTRAOCULAR               LENS PLACEMENT (IOC) LEFT DIABETIC 3.51 00:30.9;                Surgeon: Eulogio Bear, MD;  Location: [place];  Service: Ophthalmology;  Laterality: Left; No date: CERVICAL FUSION     Comment:  x2 No date: CHOLECYSTECTOMY No date: COLONOSCOPY No date: JOINT REPLACEMENT; Bilateral     Comment:  knees BMI    Body Mass Index: 53.96 kg/m     Reproductive/Obstetrics negative OB ROS                             Anesthesia Physical Anesthesia Plan  ASA: 3  Anesthesia Plan: General   Post-op Pain Management:    Induction:   PONV Risk Score and Plan:   Airway Management Planned:   Additional Equipment:   Intra-op Plan:   Post-operative Plan:   Informed Consent: I have reviewed the patients History and Physical, chart, labs and discussed the procedure including the risks, benefits and alternatives for the proposed anesthesia with the patient or authorized representative who has indicated his/her  understanding and acceptance.     Dental Advisory Given  Plan Discussed with: CRNA  Anesthesia Plan Comments:         Anesthesia Quick Evaluation

## 2021-09-09 NOTE — Anesthesia Postprocedure Evaluation (Signed)
Anesthesia Post Note  Patient: Olivia Canter  Procedure(s) Performed: COLONOSCOPY WITH PROPOFOL ESOPHAGOGASTRODUODENOSCOPY (EGD) WITH PROPOFOL  Patient location during evaluation: PACU Anesthesia Type: General Level of consciousness: awake and alert Pain management: pain level controlled Vital Signs Assessment: post-procedure vital signs reviewed and stable Respiratory status: spontaneous breathing, nonlabored ventilation, respiratory function stable and patient connected to nasal cannula oxygen Cardiovascular status: blood pressure returned to baseline and stable Postop Assessment: no apparent nausea or vomiting Anesthetic complications: no   No notable events documented.   Last Vitals:  Vitals:   09/09/21 1220 09/09/21 1230  BP: 115/67   Pulse:  72  Resp:    Temp:    SpO2:  99%    Last Pain:  Vitals:   09/09/21 1230  TempSrc:   PainSc: 0-No pain                 Molli Barrows

## 2021-09-09 NOTE — Transfer of Care (Signed)
Immediate Anesthesia Transfer of Care Note  Patient: Anna Juarez  Procedure(s) Performed: COLONOSCOPY WITH PROPOFOL ESOPHAGOGASTRODUODENOSCOPY (EGD) WITH PROPOFOL  Patient Location: PACU  Anesthesia Type:General  Level of Consciousness: awake and alert   Airway & Oxygen Therapy: Patient Spontanous Breathing  Post-op Assessment: Report given to RN and Post -op Vital signs reviewed and stable  Post vital signs: Reviewed and stable  Last Vitals:  Vitals Value Taken Time  BP 115/77 09/09/21 1211  Temp 36 C 09/09/21 1210  Pulse 70 09/09/21 1211  Resp 13 09/09/21 1211  SpO2 98 % 09/09/21 1211  Vitals shown include unvalidated device data.  Last Pain:  Vitals:   09/09/21 1210  TempSrc: Temporal  PainSc: Asleep         Complications: No notable events documented.

## 2021-09-09 NOTE — Op Note (Signed)
[facility] Gastroenterology Patient Name: Anna Juarez Procedure Date: 09/09/2021 11:01 AM MRN: [id number] Account #: [id number] Date of Birth: 17-Mar-1951 Admit Type: Outpatient Age: 71 Room: [facility] ENDO ROOM 4 Gender: Female Note Status: Finalized Instrument Name: Upper Endoscope [id number] Procedure:             Upper GI endoscopy Indications:           Epigastric abdominal pain Providers:             Lucilla Lame MD, MD Referring MD:          [name] (Referring MD) Medicines:             Propofol per Anesthesia Complications:         No immediate complications. Procedure:             Pre-Anesthesia Assessment:                        - Prior to the procedure, a History and Physical was                         performed, and patient medications and allergies were                         reviewed. The patient's tolerance of previous                         anesthesia was also reviewed. The risks and benefits                         of the procedure and the sedation options and risks                         were discussed with the patient. All questions were                         answered, and informed consent was obtained. Prior                         Anticoagulants: The patient has taken no previous                         anticoagulant or antiplatelet agents. ASA Grade                         Assessment: II - A patient with mild systemic disease.                         After reviewing the risks and benefits, the patient                         was deemed in satisfactory condition to undergo the                         procedure.                        After obtaining informed consent, the endoscope was  passed under direct vision. Throughout the procedure,                         the patient's blood pressure, pulse, and oxygen                         saturations were monitored continuously. The Endoscope                         was  introduced through the mouth, and advanced to the                         second part of duodenum. The upper GI endoscopy was                         accomplished without difficulty. The patient tolerated                         the procedure well. Findings:      The examined esophagus was normal.      Diffuse moderate inflammation characterized by erythema was found in the       gastric body and in the gastric antrum. Biopsies were taken with a cold       forceps for histology.      The examined duodenum was normal. Impression:            - Normal esophagus.                        - Gastritis. Biopsied.                        - Normal examined duodenum. Recommendation:        - Discharge patient to home.                        - Resume previous diet.                        - Perform a colonoscopy today. Procedure Code(s):     --- Professional ---                        [phone], Esophagogastroduodenoscopy, flexible,                         transoral; with biopsy, single or multiple Diagnosis Code(s):     --- Professional ---                        R10.13, Epigastric pain                        K29.70, Gastritis, unspecified, without bleeding CPT copyright 2019 American Medical Association. All rights reserved. The codes documented in this report are preliminary and upon coder review may  be revised to meet current compliance requirements. Lucilla Lame MD, MD 09/09/2021 11:54:15 AM This report has been signed electronically. Number of Addenda: 0 Note Initiated On: 09/09/2021 11:01 AM Estimated Blood Loss:  Estimated blood loss: none.      [facility]

## 2021-09-10 ENCOUNTER — Telehealth: Payer: Self-pay

## 2021-09-10 ENCOUNTER — Encounter: Payer: Self-pay | Admitting: Gastroenterology

## 2021-09-10 ENCOUNTER — Other Ambulatory Visit: Payer: Self-pay | Admitting: Nurse Practitioner

## 2021-09-10 DIAGNOSIS — R1084 Generalized abdominal pain: Secondary | ICD-10-CM

## 2021-09-10 DIAGNOSIS — R634 Abnormal weight loss: Secondary | ICD-10-CM

## 2021-09-10 LAB — SURGICAL PATHOLOGY

## 2021-09-10 NOTE — Telephone Encounter (Signed)
-----   Message from Lucilla Lame, MD sent at 09/10/2021  8:36 AM EDT ----- This patient needs to be set up for a CT scan of the abdomen with and without contrast for generalized abdominal pain and weight loss.

## 2021-09-10 NOTE — Telephone Encounter (Signed)
Requested medication (s) are due for refill today: yes  Requested medication (s) are on the active medication list: yes  Last refill:  03/27/21  Future visit scheduled: yes  Notes to clinic:  Unable to refill per protocol due to medication was discontinued 03/27/21 by PCP due to dose change.     Requested Prescriptions  Pending Prescriptions Disp Refills   traZODone (DESYREL) 50 MG tablet [Pharmacy Med Name: TRAZODONE 50 MG TABLET] 30 tablet 0    Sig: TAKE 1/2 TO 1 TABLET BY MOUTH AT BEDTIME AS NEEDED FOR SLEEP     Psychiatry: Antidepressants - Serotonin Modulator Passed - 09/10/2021  1:30 PM      Passed - Valid encounter within last 6 months    Recent Outpatient Visits           2 months ago Hypertension associated with diabetes ([place])   [facility] [name], NP   3 months ago BRBPR (bright red blood per rectum)   [facility] Vigg, Avanti, MD   4 months ago Acute cystitis without hematuria   [place], NP   5 months ago Hypertension associated with diabetes [facility])   [facility] [name], NP   6 months ago Acute cystitis without hematuria   [facility] Valerie Roys, DO       Future Appointments             In 2 weeks [name], NP [facility], [place]              c

## 2021-09-11 NOTE — Addendum Note (Signed)
Addended by: Lurlean Nanny on: 09/11/2021 03:03 PM   Modules accepted: Orders

## 2021-09-11 NOTE — Telephone Encounter (Signed)
CT scan scheduled for [facility] on June 16 arrive at 2:00 pm  NPO 4 hours prior

## 2021-09-12 NOTE — Addendum Note (Signed)
Addended by: Lurlean Nanny on: 09/12/2021 10:33 AM   Modules accepted: Orders

## 2021-09-12 NOTE — Telephone Encounter (Signed)
Pt is aware that she will need to pick up oral contrast by 6/14 along with kidney function labs

## 2021-09-16 ENCOUNTER — Other Ambulatory Visit
Admission: RE | Admit: 2021-09-16 | Discharge: 2021-09-16 | Disposition: A | Discharge status: Home / Self Care | Payer: Medicare PPO | Attending: Gastroenterology | Admitting: Gastroenterology

## 2021-09-16 DIAGNOSIS — R1084 Generalized abdominal pain: Secondary | ICD-10-CM | POA: Diagnosis present

## 2021-09-16 LAB — BASIC METABOLIC PANEL
Anion gap: 11 (ref 5–15)
BUN: 12 mg/dL (ref 8–23)
CO2: 28 mmol/L (ref 22–32)
Calcium: 9.1 mg/dL (ref 8.9–10.3)
Chloride: 101 mmol/L (ref 98–111)
Creatinine, Ser: 1.31 mg/dL — ABNORMAL HIGH (ref 0.44–1.00)
GFR, Estimated: 43 mL/min — ABNORMAL LOW (ref >60)
Glucose, Bld: 182 mg/dL — ABNORMAL HIGH (ref 70–99)
Potassium: 3.9 mmol/L (ref 3.5–5.1)
Sodium: 140 mmol/L (ref 135–145)

## 2021-09-19 ENCOUNTER — Ambulatory Visit
Admission: RE | Admit: 2021-09-19 | Discharge: 2021-09-19 | Disposition: A | Discharge status: Home / Self Care | Payer: Medicare PPO | Source: Ambulatory Visit | Attending: Gastroenterology | Admitting: Gastroenterology

## 2021-09-19 ENCOUNTER — Other Ambulatory Visit: Payer: Self-pay | Admitting: Gastroenterology

## 2021-09-19 ENCOUNTER — Other Ambulatory Visit: Payer: Self-pay | Admitting: Nurse Practitioner

## 2021-09-19 DIAGNOSIS — R634 Abnormal weight loss: Secondary | ICD-10-CM | POA: Insufficient documentation

## 2021-09-19 DIAGNOSIS — R1084 Generalized abdominal pain: Secondary | ICD-10-CM | POA: Insufficient documentation

## 2021-09-19 NOTE — Telephone Encounter (Signed)
Requested medication (s) are due for refill today: yes  Requested medication (s) are on the active medication list: yes  Last refill:  08/20/21 #30 0 refills  Future visit scheduled: yes in 6 days  Notes to clinic:  do you want to continue refills?     Requested Prescriptions  Pending Prescriptions Disp Refills   FARXIGA 10 MG TABS tablet [Pharmacy Med Name: FARXIGA 10 MG TABLET] 30 tablet 0    Sig: TAKE 1 TABLET BY MOUTH DAILY BEFORE BREAKFAST.     Endocrinology:  Diabetes - SGLT2 Inhibitors Failed - 09/19/2021 10:41 AM      Failed - Cr in normal range and within 360 days    Creatinine, Ser  Date Value Ref Range Status  09/16/2021 1.31 (H) 0.44 - 1.00 mg/dL Final         Failed - eGFR in normal range and within 360 days    GFR calc Af Amer  Date Value Ref Range Status  05/17/2020 55 (L) >59 mL/min/1.73 Final    Comment:    **In accordance with recommendations from the NKF-ASN Task force,**   [facility] is in the process of updating its eGFR calculation to the   2021 CKD-EPI creatinine equation that estimates kidney function   without a race variable.    GFR, Estimated  Date Value Ref Range Status  09/16/2021 43 (L) >60 mL/min Final    Comment:    (NOTE) Calculated using the CKD-EPI Creatinine Equation (2021)    eGFR  Date Value Ref Range Status  06/25/2021 37 (L) >59 mL/min/1.73 Final         Passed - HBA1C is between 0 and 7.9 and within 180 days    Hemoglobin A1C  Date Value Ref Range Status  09/13/2018 8.0  Final   HB A1C (BAYER DCA - WAIVED)  Date Value Ref Range Status  12/27/2020 7.9 (H) 4.8 - 5.6 % Final    Comment:             Prediabetes: 5.7 - 6.4          Diabetes: >6.4          Glycemic control for adults with diabetes: <7.0               **Please note reference interval change**    Hgb A1c MFr Bld  Date Value Ref Range Status  06/25/2021 6.9 (H) 4.8 - 5.6 % Final    Comment:             Prediabetes: 5.7 - 6.4          Diabetes: >6.4           Glycemic control for adults with diabetes: <7.0          Passed - Valid encounter within last 6 months    Recent Outpatient Visits           2 months ago Hypertension associated with diabetes ([place])   [facility] [name], NP   3 months ago BRBPR (bright red blood per rectum)   [facility] Vigg, Avanti, MD   4 months ago Acute cystitis without hematuria   [facility] [name], NP   5 months ago Hypertension associated with diabetes [facility])   [facility] [name], NP   6 months ago Acute cystitis without hematuria   [facility] Valerie Roys, DO       Future Appointments  In 6 days [name], NP [facility]            Signed Prescriptions Disp Refills   pantoprazole (PROTONIX) 20 MG tablet 90 tablet 1    Sig: TAKE 1 TABLET BY MOUTH EVERY DAY     Gastroenterology: Proton Pump Inhibitors Passed - 09/19/2021 10:41 AM      Passed - Valid encounter within last 12 months    Recent Outpatient Visits           2 months ago Hypertension associated with diabetes ([place])   [facility] [name], NP   3 months ago BRBPR (bright red blood per rectum)   [facility] Vigg, Avanti, MD   4 months ago Acute cystitis without hematuria   [place], NP   5 months ago Hypertension associated with diabetes [facility])   [facility] [name], NP   6 months ago Acute cystitis without hematuria   [facility] Valerie Roys, DO       Future Appointments             In 6 days [name], NP [facility], [place]

## 2021-09-19 NOTE — Telephone Encounter (Signed)
Requested Prescriptions  Pending Prescriptions Disp Refills  . pantoprazole (PROTONIX) 20 MG tablet [Pharmacy Med Name: PANTOPRAZOLE SOD DR 20 MG TAB] 90 tablet 1    Sig: TAKE 1 TABLET BY MOUTH EVERY DAY     Gastroenterology: Proton Pump Inhibitors Passed - 09/19/2021 10:41 AM      Passed - Valid encounter within last 12 months    Recent Outpatient Visits          2 months ago Hypertension associated with diabetes ([place])   [facility] [name], NP   3 months ago BRBPR (bright red blood per rectum)   [facility] Vigg, Avanti, MD   4 months ago Acute cystitis without hematuria   [place], NP   5 months ago Hypertension associated with diabetes [facility])   [facility] [name], NP   6 months ago Acute cystitis without hematuria   [facility] Valerie Roys, DO      Future Appointments            In 6 days [name], NP [facility] [place]           . FARXIGA 10 MG TABS tablet [Pharmacy Med Name: FARXIGA 10 MG TABLET] 30 tablet 0    Sig: TAKE 1 TABLET BY MOUTH DAILY BEFORE BREAKFAST.     Endocrinology:  Diabetes - SGLT2 Inhibitors Failed - 09/19/2021 10:41 AM      Failed - Cr in normal range and within 360 days    Creatinine, Ser  Date Value Ref Range Status  09/16/2021 1.31 (H) 0.44 - 1.00 mg/dL Final         Failed - eGFR in normal range and within 360 days    GFR calc Af Amer  Date Value Ref Range Status  05/17/2020 55 (L) >59 mL/min/1.73 Final    Comment:    **In accordance with recommendations from the NKF-ASN Task force,**   [facility] is in the process of updating its eGFR calculation to the   2021 CKD-EPI creatinine equation that estimates kidney function   without a race variable.    GFR, Estimated  Date Value Ref Range Status  09/16/2021 43 (L) >60 mL/min Final    Comment:    (NOTE) Calculated using the CKD-EPI Creatinine Equation (2021)     eGFR  Date Value Ref Range Status  06/25/2021 37 (L) >59 mL/min/1.73 Final         Passed - HBA1C is between 0 and 7.9 and within 180 days    Hemoglobin A1C  Date Value Ref Range Status  09/13/2018 8.0  Final   HB A1C (BAYER DCA - WAIVED)  Date Value Ref Range Status  12/27/2020 7.9 (H) 4.8 - 5.6 % Final    Comment:             Prediabetes: 5.7 - 6.4          Diabetes: >6.4          Glycemic control for adults with diabetes: <7.0               **Please note reference interval change**    Hgb A1c MFr Bld  Date Value Ref Range Status  06/25/2021 6.9 (H) 4.8 - 5.6 % Final    Comment:             Prediabetes: 5.7 - 6.4          Diabetes: >6.4  Glycemic control for adults with diabetes: <7.0          Passed - Valid encounter within last 6 months    Recent Outpatient Visits          2 months ago Hypertension associated with diabetes ([place])   [facility] [name], NP   3 months ago BRBPR (bright red blood per rectum)   [facility] Vigg, Avanti, MD   4 months ago Acute cystitis without hematuria   [place], NP   5 months ago Hypertension associated with diabetes [facility])   [facility] [name], NP   6 months ago Acute cystitis without hematuria   [facility] Valerie Roys, DO      Future Appointments            In 6 days [name], NP [facility], [place]

## 2021-09-24 ENCOUNTER — Telehealth: Payer: Self-pay

## 2021-09-24 NOTE — Telephone Encounter (Signed)
Patient is calling about some test results she received on mychart and she would like a call back

## 2021-09-25 ENCOUNTER — Ambulatory Visit: Payer: Medicare PPO | Admitting: Nurse Practitioner

## 2021-09-28 ENCOUNTER — Other Ambulatory Visit: Payer: Self-pay | Admitting: Family Medicine

## 2021-09-28 ENCOUNTER — Other Ambulatory Visit: Payer: Self-pay | Admitting: Nurse Practitioner

## 2021-09-28 DIAGNOSIS — G8929 Other chronic pain: Secondary | ICD-10-CM

## 2021-09-29 ENCOUNTER — Ambulatory Visit (INDEPENDENT_AMBULATORY_CARE_PROVIDER_SITE_OTHER): Payer: Medicare PPO | Admitting: *Deleted

## 2021-09-29 DIAGNOSIS — Z0000 Encounter for general adult medical examination without abnormal findings: Secondary | ICD-10-CM | POA: Diagnosis not present

## 2021-09-29 NOTE — Telephone Encounter (Signed)
Tramadol is due, Trazodone is not due.

## 2021-10-08 ENCOUNTER — Ambulatory Visit (INDEPENDENT_AMBULATORY_CARE_PROVIDER_SITE_OTHER): Payer: Medicare PPO | Admitting: Gastroenterology

## 2021-10-08 ENCOUNTER — Encounter: Payer: Self-pay | Admitting: Gastroenterology

## 2021-10-08 VITALS — BP 125/77 | HR 50 | Temp 98.0°F

## 2021-10-08 DIAGNOSIS — K7460 Unspecified cirrhosis of liver: Secondary | ICD-10-CM

## 2021-10-08 MED ORDER — DICYCLOMINE HCL 20 MG PO TABS: 20.0000 mg | ORAL_TABLET | Freq: Three times a day (TID) | ORAL | 3 refills | Status: DC

## 2021-10-08 NOTE — Progress Notes (Signed)
Primary Care Physician: [name], NP  Primary Gastroenterologist:  Dr. Lucilla Lame  Chief Complaint  Patient presents with  . Follow-up    HPI: Anna Juarez is a 71 y.o. female here for follow-up due to abdominal pain and the patient underwent EGD and colonoscopy.  Biopsies at that time showed:  DIAGNOSIS:  A.  STOMACH; BIOPSIES:  - GASTRIC MUCOSA WITH NO SPECIFIC PATHOLOGIC ABNORMALITY.  - NO SIGNIFICANT INTESTINAL METAPLASIA, DYSPLASIA OR GLANDULAR ATROPHY.   B.  COLON, ASCENDING, POLYP; COLD SNARE BIOPSIES:  - SESSILE SERRATED POLYP.  - NO EVIDENCE OF HIGH-GRADE DYSPLASIA OR MALIGNANCY.   The patient was then set up for CT scan of the abdomen since no cause for her abdominal pain was seen.  The results of the CT scan showed:  IMPRESSION: No evidence of urolithiasis, hydronephrosis, or other acute findings.   Colonic diverticulosis, without radiographic evidence of diverticulitis.   Hepatic cirrhosis.  The patient reports that she did not know she had cirrhosis but does recall being told that she had fatty liver disease.  She still endorses abdominal pain after she eats.  Despite seeing some gastritis of the stomach the biopsies showed normal stomach mucosa.   Past Medical History:  Diagnosis Date  . Arthritis   . Chronic kidney disease   . Complication of anesthesia   . Diabetes mellitus without complication ([place])   . Extreme obesity   . Fatty liver   . GERD (gastroesophageal reflux disease)   . Hyperlipidemia   . Hypertension   . Palpitations   . PONV (postoperative nausea and vomiting)   . Radiculopathy, cervical   . Renal insufficiency    stage 3  . Sleep apnea    uses cpap  . Tear of acetabular labrum 01/2021   right    Current Outpatient Medications  Medication Sig Dispense Refill  . acetaminophen (TYLENOL) 500 MG tablet Take 2 tablets (1,000 mg total) by mouth every 6 (six) hours as needed for mild pain.    . Ascorbic Acid (VITAMIN  C) 1000 MG tablet Take 1,000 mg by mouth daily.    . benazepril (LOTENSIN) 40 MG tablet TAKE ONE TABLET BY MOUTH ONCE DAILY 90 tablet 1  . FARXIGA 10 MG TABS tablet TAKE 1 TABLET BY MOUTH DAILY BEFORE BREAKFAST. 30 tablet 0  . glipiZIDE (GLUCOTROL) 5 MG tablet TAKE ONE TABLET BY MOUTH TWICE DAILY BEFORE A meal 180 tablet 1  . glucose blood test strip Use as instructed 100 each 12  . hydrocortisone 2.5 % cream Apply topically 2 (two) times daily. 30 g 2  . lovastatin (MEVACOR) 40 MG tablet TAKE ONE TABLET BY MOUTH EVERYDAY AT BEDTIME 90 tablet 1  . metoprolol succinate (TOPROL-XL) 50 MG 24 hr tablet TAKE ONE TABLET BY MOUTH ONCE DAILY with A meal 90 tablet 1  . pantoprazole (PROTONIX) 20 MG tablet TAKE 1 TABLET BY MOUTH EVERY DAY 90 tablet 1  . pregabalin (LYRICA) 150 MG capsule TAKE ONE CAPSULE BY MOUTH THREE TIMES DAILY 270 capsule 0  . traMADol (ULTRAM) 50 MG tablet TAKE 1 TABLET BY MOUTH EVERY 8 HOURS AS NEEDED 30 tablet 1  . traZODone (DESYREL) 100 MG tablet TAKE 1 TABLET BY MOUTH AT BEDTIME AS NEEDED FOR SLEEP. 90 tablet 1  . traZODone (DESYREL) 50 MG tablet TAKE 1/2 TO 1 TABLET BY MOUTH AT BEDTIME AS NEEDED FOR SLEEP 90 tablet 1   No current facility-administered medications for this visit.    Allergies  as of 10/08/2021 - Review Complete 10/08/2021  Allergen Reaction Noted  . Actos [pioglitazone] Nausea And Vomiting 10/02/2014  . Metformin and related Diarrhea 10/02/2014  . Scopolamine Nausea And Vomiting 01/31/2021  . Victoza [liraglutide] Nausea And Vomiting 10/02/2014    ROS:  General: Negative for anorexia, weight loss, fever, chills, fatigue, weakness. ENT: Negative for hoarseness, difficulty swallowing , nasal congestion. CV: Negative for chest pain, angina, palpitations, dyspnea on exertion, peripheral edema.  Respiratory: Negative for dyspnea at rest, dyspnea on exertion, cough, sputum, wheezing.  GI: See history of present illness. GU:  Negative for dysuria, hematuria,  urinary incontinence, urinary frequency, nocturnal urination.  Endo: Negative for unusual weight change.    Physical Examination:   BP 125/77   Pulse (!) 50   Temp 98 F (36.7 C) (Oral)   General: Well-nourished, well-developed in no acute distress.  Eyes: No icterus. Conjunctivae pink. Neuro: Alert and oriented x 3.  Grossly intact. Skin: Warm and dry, no jaundice.   Psych: Alert and cooperative, normal mood and affect.  Labs:    Imaging Studies: CT ABDOMEN PELVIS WO CONTRAST  Result Date: 09/21/2021 CLINICAL DATA:  Generalized abdominal pain. Unintentional weight loss. EXAM: CT ABDOMEN AND PELVIS WITHOUT CONTRAST TECHNIQUE: Multidetector CT imaging of the abdomen and pelvis was performed following the standard protocol without IV contrast. RADIATION DOSE REDUCTION: This exam was performed according to the departmental dose-optimization program which includes automated exposure control, adjustment of the mA and/or kV according to patient size and/or use of iterative reconstruction technique. COMPARISON:  11/29/2009 and 04/23/2009 FINDINGS: Lower chest: No acute findings. Hepatobiliary: Hypertrophy of caudate and left lobes and mild capsular nodularity are consistent with cirrhosis. No mass visualized on this unenhanced exam. Prior cholecystectomy. No evidence of biliary obstruction. Pancreas: No mass or inflammatory process visualized on this unenhanced exam. Spleen:  Within normal limits in size. Adrenals/Urinary tract: No evidence of urolithiasis or hydronephrosis. Unremarkable unopacified urinary bladder. Stomach/Bowel: No evidence of obstruction, inflammatory process, or abnormal fluid collections. Normal appendix visualized. Diverticulosis is seen mainly involving the sigmoid colon, however there is no evidence of diverticulitis. Vascular/Lymphatic: No pathologically enlarged lymph nodes identified. No evidence of abdominal aortic aneurysm. Aortic atherosclerotic calcification incidentally  noted. Reproductive: Prior hysterectomy noted. Adnexal regions are unremarkable in appearance. Other:  None. Musculoskeletal:  No suspicious bone lesions identified. IMPRESSION: No evidence of urolithiasis, hydronephrosis, or other acute findings. Colonic diverticulosis, without radiographic evidence of diverticulitis. Hepatic cirrhosis. Electronically Signed   By: Marlaine Hind M.D.   On: 09/21/2021 17:45    Assessment and Plan:   Anna Juarez is a 71 y.o. y/o female comes in today with a history of abdominal pain.  The patient EGD colonoscopy and CT scan did not show a cause for her abdominal pain. The patient will be started on a trial of dicyclomine.  She will also have her blood tested for alpha-fetoprotein due to her new diagnosis of cirrhosis.  The patient will also have a repeat ultrasound of the right upper quadrant and alpha-fetoprotein in 6 months.  The patient has been the plan and agrees with it.     Lucilla Lame, MD. Marval Regal    Note: This dictation was prepared with Dragon dictation along with smaller phrase technology. Any transcriptional errors that result from this process are unintentional.

## 2021-10-09 ENCOUNTER — Other Ambulatory Visit: Payer: Self-pay | Admitting: Family Medicine

## 2021-10-09 LAB — AFP TUMOR MARKER: AFP, Serum, Tumor Marker: 2.5 ng/mL (ref 0.0–9.2)

## 2021-10-09 NOTE — Telephone Encounter (Signed)
Requested medication (s) are due for refill today:   Not sure  provider to review  Requested medication (s) are on the active medication list:   Yes  Future visit scheduled:   Yes 7/13   Last ordered: 09/19/2021 #30, 0 refills  Is this to be continued since only ordered for 30 days?   Provider to review for refills.     Requested Prescriptions  Pending Prescriptions Disp Refills   FARXIGA 10 MG TABS tablet [Pharmacy Med Name: FARXIGA 10 MG TABLET] 30 tablet 0    Sig: TAKE 1 TABLET BY MOUTH EVERY DAY BEFORE BREAKFAST     Endocrinology:  Diabetes - SGLT2 Inhibitors Failed - 10/09/2021  1:59 AM      Failed - Cr in normal range and within 360 days    Creatinine, Ser  Date Value Ref Range Status  09/16/2021 1.31 (H) 0.44 - 1.00 mg/dL Final         Failed - eGFR in normal range and within 360 days    GFR calc Af Amer  Date Value Ref Range Status  05/17/2020 55 (L) >59 mL/min/1.73 Final    Comment:    **In accordance with recommendations from the NKF-ASN Task force,**   [facility] is in the process of updating its eGFR calculation to the   2021 CKD-EPI creatinine equation that estimates kidney function   without a race variable.    GFR, Estimated  Date Value Ref Range Status  09/16/2021 43 (L) >60 mL/min Final    Comment:    (NOTE) Calculated using the CKD-EPI Creatinine Equation (2021)    eGFR  Date Value Ref Range Status  06/25/2021 37 (L) >59 mL/min/1.73 Final         Passed - HBA1C is between 0 and 7.9 and within 180 days    Hemoglobin A1C  Date Value Ref Range Status  09/13/2018 8.0  Final   HB A1C (BAYER DCA - WAIVED)  Date Value Ref Range Status  12/27/2020 7.9 (H) 4.8 - 5.6 % Final    Comment:             Prediabetes: 5.7 - 6.4          Diabetes: >6.4          Glycemic control for adults with diabetes: <7.0               **Please note reference interval change**    Hgb A1c MFr Bld  Date Value Ref Range Status  06/25/2021 6.9 (H) 4.8 - 5.6 % Final     Comment:             Prediabetes: 5.7 - 6.4          Diabetes: >6.4          Glycemic control for adults with diabetes: <7.0          Passed - Valid encounter within last 6 months    Recent Outpatient Visits           3 months ago Hypertension associated with diabetes ([place])   [facility] [name], NP   4 months ago BRBPR (bright red blood per rectum)   [facility] Vigg, Avanti, MD   5 months ago Acute cystitis without hematuria   [facility] [name], NP   6 months ago Hypertension associated with diabetes [facility])   [facility] [name], NP   7 months ago Acute cystitis without hematuria   [facility] Valerie Roys, DO       Future Appointments             In 1 week [name], NP [facility] [place]

## 2021-10-14 ENCOUNTER — Telehealth: Payer: Self-pay

## 2021-10-14 NOTE — Chronic Care Management (AMB) (Signed)
Chronic Care Management Pharmacy Assistant   Name: KRITI KATAYAMA  MRN: [id number] DOB: 1951/03/19  Reason for Encounter: Medication coordination call   Recent office visits:  06/25/21-Karen Mathis Dad, NP (PCP) General follow up visit. Labs ordered. Follow up in 3 months. 06/10/21-Avanti Vigg, MD. Seen for Hemorrhoids. Labs ordered. Ambulatory referral to Gastroenterology. Start on lubiprostone (AMITIZA) 8 MCG capsule. Follow up in 2 weeks.  Recent consult visits:  10/08/21-Darren Allen Norris, MD (Gastroenterology) General follow up visit. 08/05/21-Darren WohL, MD (Gastroenterology) Initial visit for abdominal pain. 06/24/21-Munsoor Holley Raring, MD (Nephrology) General follow up visit. Labs ordered. Follow up in 4 months.  Hospital visits:  None in previous 6 months  Medications: Outpatient Encounter Medications as of 10/14/2021  Medication Sig   acetaminophen (TYLENOL) 500 MG tablet Take 2 tablets (1,000 mg total) by mouth every 6 (six) hours as needed for mild pain.   Ascorbic Acid (VITAMIN C) 1000 MG tablet Take 1,000 mg by mouth daily.   benazepril (LOTENSIN) 40 MG tablet TAKE ONE TABLET BY MOUTH ONCE DAILY   dicyclomine (BENTYL) 20 MG tablet Take 1 tablet (20 mg total) by mouth 3 (three) times daily before meals.   FARXIGA 10 MG TABS tablet TAKE 1 TABLET BY MOUTH DAILY BEFORE BREAKFAST.   glipiZIDE (GLUCOTROL) 5 MG tablet TAKE ONE TABLET BY MOUTH TWICE DAILY BEFORE A meal   glucose blood test strip Use as instructed   hydrocortisone 2.5 % cream Apply topically 2 (two) times daily.   lovastatin (MEVACOR) 40 MG tablet TAKE ONE TABLET BY MOUTH EVERYDAY AT BEDTIME   metoprolol succinate (TOPROL-XL) 50 MG 24 hr tablet TAKE ONE TABLET BY MOUTH ONCE DAILY with A meal   pantoprazole (PROTONIX) 20 MG tablet TAKE 1 TABLET BY MOUTH EVERY DAY   pregabalin (LYRICA) 150 MG capsule TAKE ONE CAPSULE BY MOUTH THREE TIMES DAILY   traMADol (ULTRAM) 50 MG tablet TAKE 1 TABLET BY MOUTH EVERY 8 HOURS AS  NEEDED   traZODone (DESYREL) 100 MG tablet TAKE 1 TABLET BY MOUTH AT BEDTIME AS NEEDED FOR SLEEP.   traZODone (DESYREL) 50 MG tablet TAKE 1/2 TO 1 TABLET BY MOUTH AT BEDTIME AS NEEDED FOR SLEEP   No facility-administered encounter medications on file as of 10/14/2021.   Reviewed chart for medication changes ahead of medication coordination call.  No OVs, Consults, or hospital visits since last care coordination call/Pharmacist visit. (If appropriate, list visit date, provider name)  No medication changes indicated OR if recent visit, treatment plan here.  BP Readings from Last 3 Encounters:  10/16/21 139/68  10/08/21 125/77  09/09/21 115/67    Lab Results  Component Value Date   HGBA1C 6.8 (H) 10/16/2021     Patient obtains medications through Adherence Packaging  90 Days   Last adherence delivery included: N/a (medication name and frequency)   Patient is due for next adherence delivery on: 01/29/22. Called patient and reviewed medications and coordinated delivery.  This delivery to include: Metoprolol succ er 50 mg 1 tablet during breakfast Pantoprazole 40 mg 1 tablet during breakfast increased dose to add to package per patient Benazepril 40 mg 1 tablet at breakfast Pregablin 150 mg 3x times daily sent refill request by specilist Dicyclomine 20 mg 1 tablet 3x daily before meals   Patient declined the following medications Prevident due to patient receiving the prevident medication last week on 10/09/21 by [facility]  Patient needs refills for . Metoprolol succ er 50 mg 1 tablet during breakfast Pantoprazole 40 mg 1  tablet during breakfast increased dose to add to package per patient Benazepril 40 mg 1 tablet at breakfast Pregablin 150 mg 3x times daily sent refill request by specilist Dicyclomine 20 mg 1 tablet 3x daily before meals  Confirmed delivery date of 10/29/21 , advised patient that pharmacy will contact them the morning of delivery.    Corrie Mckusick, [place]

## 2021-10-15 NOTE — Progress Notes (Unsigned)
There were no vitals taken for this visit.   Subjective:    Patient ID: Anna Juarez, female    DOB: 05/30/1950, 71 y.o.   MRN: [id number]  HPI: [name] is a 71 y.o. female  No chief complaint on file.  DIABETES Hypoglycemic episodes: no Polydipsia/polyuria: no Visual disturbance: no Chest pain: no Paresthesias: no Glucose Monitoring: yes  Accucheck frequency: Daily  Fasting glucose:   Post prandial:  Evening:  Before meals: Taking Insulin?: no  Long acting insulin:  Short acting insulin: Blood Pressure Monitoring: not checking Retinal Examination: Up to Date Foot Exam: Not up to Date Diabetic Education: Not Completed Pneumovax: Up to Date Influenza: Not up to Date Aspirin: no  HYPERTENSION / HYPERLIPIDEMIA Satisfied with current treatment? no Duration of hypertension: years BP monitoring frequency: not checking BP range:  BP medication side effects: no Past BP meds: benazepril Duration of hyperlipidemia: years Cholesterol medication side effects: no Cholesterol supplements: none Past cholesterol medications: lovastatin Medication compliance: excellent compliance Aspirin: no Recent stressors: no Recurrent headaches: no Visual changes: no Palpitations: no Dyspnea: no Chest pain: no Lower extremity edema: no Dizzy/lightheaded: no  CHRONIC KIDNEY DISEASE CKD status: controlled Medications renally dose: yes Previous renal evaluation: yes Pneumovax:  Up to Date Influenza Vaccine:  Not up to Date  STOMACH PAIN Patient states her stomach still hurts when she eats.  States when she doesn't eat it doesn't hurt.  States that it is a different hurt from before she had her surgery. Denies a burning sensation.  Epigastric.  She has taken Rolaids which hasn't helped her symptoms.  She does have some constipation.  She has been taking stool softeners which has helped.  She does have a BM everyday.     Relevant past medical, surgical, family and social  history reviewed and updated as indicated. Interim medical history since our last visit reviewed. Allergies and medications reviewed and updated.  Review of Systems  Eyes:  Negative for visual disturbance.  Respiratory:  Negative for chest tightness and shortness of breath.   Cardiovascular:  Negative for chest pain, palpitations and leg swelling.  Gastrointestinal:  Positive for abdominal pain and constipation.  Endocrine: Negative for polydipsia and polyuria.  Musculoskeletal:        Hip pain  Neurological:  Negative for dizziness, light-headedness, numbness and headaches.  Psychiatric/Behavioral:  Negative for dysphoric mood and sleep disturbance.     Per HPI unless specifically indicated above     Objective:    There were no vitals taken for this visit.  Wt Readings from Last 3 Encounters:  09/09/21 295 lb (133.8 kg)  08/05/21 300 lb (136.1 kg)  06/25/21 (!) 301 lb 6.4 oz (136.7 kg)    Physical Exam Vitals and nursing note reviewed.  Constitutional:      General: She is not in acute distress.    Appearance: Normal appearance. She is obese. She is not ill-appearing, toxic-appearing or diaphoretic.  HENT:     Head: Normocephalic.     Right Ear: External ear normal.     Left Ear: External ear normal.     Nose: Nose normal.     Mouth/Throat:     Mouth: Mucous membranes are moist.     Pharynx: Oropharynx is clear.  Eyes:     General:        Right eye: No discharge.        Left eye: No discharge.     Extraocular Movements: Extraocular movements intact.  Conjunctiva/sclera: Conjunctivae normal.     Pupils: Pupils are equal, round, and reactive to light.  Cardiovascular:     Rate and Rhythm: Normal rate and regular rhythm.     Heart sounds: No murmur heard. Pulmonary:     Effort: Pulmonary effort is normal. No respiratory distress.     Breath sounds: Normal breath sounds. No wheezing or rales.  Abdominal:     Tenderness: There is abdominal tenderness in the right  upper quadrant. There is no right CVA tenderness or guarding. Negative signs include Murphy's sign.  Musculoskeletal:     Cervical back: Normal range of motion and neck supple.  Skin:    General: Skin is warm and dry.     Capillary Refill: Capillary refill takes less than 2 seconds.  Neurological:     General: No focal deficit present.     Mental Status: She is alert and oriented to person, place, and time. Mental status is at baseline.  Psychiatric:        Mood and Affect: Mood normal.        Behavior: Behavior normal.        Thought Content: Thought content normal.        Judgment: Judgment normal.     Comments: tearful     Results for orders placed or performed in visit on 10/08/21  AFP tumor marker  Result Value Ref Range   AFP, Serum, Tumor Marker 2.5 0.0 - 9.2 ng/mL      Assessment & Plan:   Problem List Items Addressed This Visit       Cardiovascular and Mediastinum   Hypertension associated with diabetes ([place]) - Primary     Endocrine   DM type 2 with diabetic peripheral neuropathy ([place])   Hyperlipidemia associated with type 2 diabetes mellitus ([place])     Genitourinary   CKD (chronic kidney disease) stage 3, GFR 30-59 ml/min (HCC)     Other   BMI 50.0-59.9, adult ([place])     Follow up plan: No follow-ups on file.

## 2021-10-16 ENCOUNTER — Other Ambulatory Visit: Payer: Self-pay | Admitting: Nurse Practitioner

## 2021-10-16 ENCOUNTER — Encounter: Payer: Self-pay | Admitting: Nurse Practitioner

## 2021-10-16 ENCOUNTER — Ambulatory Visit: Payer: Medicare PPO | Admitting: Nurse Practitioner

## 2021-10-16 VITALS — BP 139/68 | HR 55 | Temp 98.4°F | Wt 294.4 lb

## 2021-10-16 DIAGNOSIS — N1832 Chronic kidney disease, stage 3b: Secondary | ICD-10-CM | POA: Diagnosis not present

## 2021-10-16 DIAGNOSIS — E785 Hyperlipidemia, unspecified: Secondary | ICD-10-CM

## 2021-10-16 DIAGNOSIS — E1142 Type 2 diabetes mellitus with diabetic polyneuropathy: Secondary | ICD-10-CM | POA: Diagnosis not present

## 2021-10-16 DIAGNOSIS — E1169 Type 2 diabetes mellitus with other specified complication: Secondary | ICD-10-CM | POA: Diagnosis not present

## 2021-10-16 DIAGNOSIS — Z6841 Body Mass Index (BMI) 40.0 and over, adult: Secondary | ICD-10-CM

## 2021-10-16 DIAGNOSIS — I10 Essential (primary) hypertension: Secondary | ICD-10-CM

## 2021-10-16 DIAGNOSIS — I152 Hypertension secondary to endocrine disorders: Secondary | ICD-10-CM

## 2021-10-16 DIAGNOSIS — R1084 Generalized abdominal pain: Secondary | ICD-10-CM

## 2021-10-16 DIAGNOSIS — E1159 Type 2 diabetes mellitus with other circulatory complications: Secondary | ICD-10-CM

## 2021-10-16 MED ORDER — PANTOPRAZOLE SODIUM 40 MG PO TBEC: 40.0000 mg | DELAYED_RELEASE_TABLET | Freq: Every day | ORAL | 1 refills | Status: DC

## 2021-10-16 MED ORDER — DAPAGLIFLOZIN PROPANEDIOL 10 MG PO TABS: 10.0000 mg | ORAL_TABLET | Freq: Every day | ORAL | 1 refills | Status: DC

## 2021-10-16 MED ORDER — DAPAGLIFLOZIN PROPANEDIOL 10 MG PO TABS: 10.0000 mg | ORAL_TABLET | Freq: Every day | ORAL | 0 refills | Status: DC

## 2021-10-16 NOTE — Telephone Encounter (Signed)
Requested Prescriptions  Pending Prescriptions Disp Refills  . metoprolol succinate (TOPROL-XL) 50 MG 24 hr tablet [Pharmacy Med Name: metoprolol succinate ER 50 mg tablet,extended release 24 hr] 90 tablet 1    Sig: TAKE ONE TABLET BY MOUTH ONCE DAILY WITH A MEAL     Cardiovascular:  Beta Blockers Passed - 10/16/2021  9:57 AM      Passed - Last BP in normal range    BP Readings from Last 1 Encounters:  10/16/21 139/68         Passed - Last Heart Rate in normal range    Pulse Readings from Last 1 Encounters:  10/16/21 (!) 55         Passed - Valid encounter within last 6 months    Recent Outpatient Visits          Today Hypertension associated with diabetes ([place])   [facility] [name], NP   3 months ago Hypertension associated with diabetes ([place])   [facility] [name], NP   4 months ago BRBPR (bright red blood per rectum)   [facility] Vigg, Avanti, MD   5 months ago Acute cystitis without hematuria   [place], NP   6 months ago Hypertension associated with diabetes [facility])   [place] [name], NP      Future Appointments            In 2 months [name], NP [facility], [place]           . glipiZIDE (GLUCOTROL) 5 MG tablet [Pharmacy Med Name: glipizide 5 mg tablet] 180 tablet 1    Sig: TAKE ONE TABLET BY MOUTH TWICE DAILY BEFORE A meal     Endocrinology:  Diabetes - Sulfonylureas Failed - 10/16/2021  9:57 AM      Failed - Cr in normal range and within 360 days    Creatinine, Ser  Date Value Ref Range Status  09/16/2021 1.31 (H) 0.44 - 1.00 mg/dL Final         Passed - HBA1C is between 0 and 7.9 and within 180 days    Hemoglobin A1C  Date Value Ref Range Status  09/13/2018 8.0  Final   HB A1C (BAYER DCA - WAIVED)  Date Value Ref Range Status  12/27/2020 7.9 (H) 4.8 - 5.6 % Final    Comment:             Prediabetes: 5.7 - 6.4           Diabetes: >6.4          Glycemic control for adults with diabetes: <7.0               **Please note reference interval change**    Hgb A1c MFr Bld  Date Value Ref Range Status  06/25/2021 6.9 (H) 4.8 - 5.6 % Final    Comment:             Prediabetes: 5.7 - 6.4          Diabetes: >6.4          Glycemic control for adults with diabetes: <7.0          Passed - Valid encounter within last 6 months    Recent Outpatient Visits          Today Hypertension associated with diabetes [facility])   [facility] [name], NP   3 months ago Hypertension associated with diabetes ([place])  [facility] [name], NP   4 months ago BRBPR (bright red blood per rectum)   [facility] Vigg, Avanti, MD   5 months ago Acute cystitis without hematuria   [facility] [name], NP   6 months ago Hypertension associated with diabetes [facility])   [facility] [name], NP      Future Appointments            In 2 months [name], NP [place]           . lovastatin (MEVACOR) 40 MG tablet [Pharmacy Med Name: lovastatin 40 mg tablet] 90 tablet 1    Sig: TAKE ONE TABLET BY MOUTH EVERYDAY AT BEDTIME     Cardiovascular:  Antilipid - Statins 2 Failed - 10/16/2021  9:57 AM      Failed - Cr in normal range and within 360 days    Creatinine, Ser  Date Value Ref Range Status  09/16/2021 1.31 (H) 0.44 - 1.00 mg/dL Final         Failed - Lipid Panel in normal range within the last 12 months    Cholesterol, Total  Date Value Ref Range Status  06/25/2021 160 100 - 199 mg/dL Final   Cholesterol Piccolo, Waived  Date Value Ref Range Status  03/21/2018 150 <200 mg/dL Final    Comment:                            Desirable                <200                         Borderline High      200- 239                         High                     >239    LDL Chol Calc (NIH)  Date Value  Ref Range Status  06/25/2021 76 0 - 99 mg/dL Final   HDL  Date Value Ref Range Status  06/25/2021 56 >39 mg/dL Final   Triglycerides  Date Value Ref Range Status  06/25/2021 167 (H) 0 - 149 mg/dL Final   Triglycerides Piccolo,Waived  Date Value Ref Range Status  03/21/2018 279 (H) <150 mg/dL Final    Comment:                            Normal                   <150                         Borderline High     150 - 199                         High                200 - 499                         Very High                >  26          Passed - Patient is not pregnant      Passed - Valid encounter within last 12 months    Recent Outpatient Visits          Today Hypertension associated with diabetes ([place])   [facility] [name], NP   3 months ago Hypertension associated with diabetes ([place])   [facility] [name], NP   4 months ago BRBPR (bright red blood per rectum)   [facility] Vigg, Avanti, MD   5 months ago Acute cystitis without hematuria   [place], NP   6 months ago Hypertension associated with diabetes [facility])   [facility] [name], NP      Future Appointments            In 2 months [name], NP [facility], [place]

## 2021-10-16 NOTE — Assessment & Plan Note (Signed)
Chronic.  Controlled.  Continue with current medication regimen of Benzapril 40mg daily.  Labs ordered today.  Return to clinic in 3 months for reevaluation.  Call sooner if concerns arise.   

## 2021-10-16 NOTE — Assessment & Plan Note (Signed)
Chronic.  Controlled.  A1c is 6.9.  Continue with current medication regimen on Farxiga '10mg'$  daily.  Will stop Glipizide due to ongoing abdominal pain and a1c being well controlled.  If a1c increases can go back on Glipizide.  Labs ordered today.  Return to clinic in 3 months for reevaluation.  Call sooner if concerns arise.

## 2021-10-16 NOTE — Assessment & Plan Note (Signed)
Chronic. Ongoing x several months. Symptoms started in early 2023 and was found to have acute cholecystitis.  Gallbladder was removed.  Symptoms improved for a short period of time but returned.  Symptoms are difficult for patient to describe but feels full, nausea, and unable to eat.  Feels the best in the morning.  Very difficult for her to eat later in the day.  Has seen Dr. Allen Norris who has done several tests including EGD and Colonoscopy.  She was given Bentyl which is improved symptoms very minimally.  Will stop Glipizide and Lovastatin to see if symptoms improve.  Not likely to see improvement but GI told her to follow up PRN.

## 2021-10-16 NOTE — Assessment & Plan Note (Signed)
Chronic.  Controlled.  Will stop Lovastatin to see if abdominal symptoms improve.   Labs ordered today.  Return to clinic in 2 months for reevaluation.  Call sooner if concerns arise.

## 2021-10-16 NOTE — Telephone Encounter (Signed)
Requested by interface surescripts. Glipizide discontinued 10/16/21 and mevacor discontinued 10/16/21 and  noted on 04/29/21. Requested Prescriptions  Signed Prescriptions Disp Refills   metoprolol succinate (TOPROL-XL) 50 MG 24 hr tablet 90 tablet 1    Sig: TAKE ONE TABLET BY MOUTH ONCE DAILY WITH A MEAL     Cardiovascular:  Beta Blockers Passed - 10/16/2021  9:57 AM      Passed - Last BP in normal range    BP Readings from Last 1 Encounters:  10/16/21 139/68         Passed - Last Heart Rate in normal range    Pulse Readings from Last 1 Encounters:  10/16/21 (!) 55         Passed - Valid encounter within last 6 months    Recent Outpatient Visits          Today Hypertension associated with diabetes ([place])   [facility] [name], NP   3 months ago Hypertension associated with diabetes ([place])   [facility] [name], NP   4 months ago BRBPR (bright red blood per rectum)   [facility] Vigg, Avanti, MD   5 months ago Acute cystitis without hematuria   [place], NP   6 months ago Hypertension associated with diabetes [facility])   [facility] [name], NP      Future Appointments            In 2 months [name], NP [facility], [facility]           Refused Prescriptions Disp Refills  . glipiZIDE (GLUCOTROL) 5 MG tablet [Pharmacy Med Name: glipizide 5 mg tablet] 180 tablet 1    Sig: TAKE ONE TABLET BY MOUTH TWICE DAILY BEFORE A meal     Endocrinology:  Diabetes - Sulfonylureas Failed - 10/16/2021  9:57 AM      Failed - Cr in normal range and within 360 days    Creatinine, Ser  Date Value Ref Range Status  09/16/2021 1.31 (H) 0.44 - 1.00 mg/dL Final         Passed - HBA1C is between 0 and 7.9 and within 180 days    Hemoglobin A1C  Date Value Ref Range Status  09/13/2018 8.0  Final   HB A1C (BAYER DCA - WAIVED)  Date Value Ref Range Status  12/27/2020  7.9 (H) 4.8 - 5.6 % Final    Comment:             Prediabetes: 5.7 - 6.4          Diabetes: >6.4          Glycemic control for adults with diabetes: <7.0               **Please note reference interval change**    Hgb A1c MFr Bld  Date Value Ref Range Status  06/25/2021 6.9 (H) 4.8 - 5.6 % Final    Comment:             Prediabetes: 5.7 - 6.4          Diabetes: >6.4          Glycemic control for adults with diabetes: <7.0          Passed - Valid encounter within last 6 months    Recent Outpatient Visits          Today Hypertension associated with diabetes [facility])   [facility] [name], NP  3 months ago Hypertension associated with diabetes ([place])   [facility] [name], NP   4 months ago BRBPR (bright red blood per rectum)   [facility] Vigg, Avanti, MD   5 months ago Acute cystitis without hematuria   [facility] [name], NP   6 months ago Hypertension associated with diabetes ([place])   [facility] [name], NP      Future Appointments            In 2 months [name], NP [place], [facility]           . lovastatin (MEVACOR) 40 MG tablet [Pharmacy Med Name: lovastatin 40 mg tablet] 90 tablet 1    Sig: TAKE ONE TABLET BY MOUTH EVERYDAY AT BEDTIME     Cardiovascular:  Antilipid - Statins 2 Failed - 10/16/2021  9:57 AM      Failed - Cr in normal range and within 360 days    Creatinine, Ser  Date Value Ref Range Status  09/16/2021 1.31 (H) 0.44 - 1.00 mg/dL Final         Failed - Lipid Panel in normal range within the last 12 months    Cholesterol, Total  Date Value Ref Range Status  06/25/2021 160 100 - 199 mg/dL Final   Cholesterol Piccolo, Waived  Date Value Ref Range Status  03/21/2018 150 <200 mg/dL Final    Comment:                            Desirable                <200                         Borderline High      200- 239                          High                     >239    LDL Chol Calc (NIH)  Date Value Ref Range Status  06/25/2021 76 0 - 99 mg/dL Final   HDL  Date Value Ref Range Status  06/25/2021 56 >39 mg/dL Final   Triglycerides  Date Value Ref Range Status  06/25/2021 167 (H) 0 - 149 mg/dL Final   Triglycerides Piccolo,Waived  Date Value Ref Range Status  03/21/2018 279 (H) <150 mg/dL Final    Comment:                            Normal                   <150                         Borderline High     150 - 199                         High                200 - 499                         Very High                >  10          Passed - Patient is not pregnant      Passed - Valid encounter within last 12 months    Recent Outpatient Visits          Today Hypertension associated with diabetes ([place])   [facility] [name], NP   3 months ago Hypertension associated with diabetes ([place])   [facility] [name], NP   4 months ago BRBPR (bright red blood per rectum)   [facility] Vigg, Avanti, MD   5 months ago Acute cystitis without hematuria   [place], NP   6 months ago Hypertension associated with diabetes [facility])   [facility] [name], NP      Future Appointments            In 2 months [name], NP [facility], [place]

## 2021-10-16 NOTE — Assessment & Plan Note (Signed)
Labs ordered today.  Will make recommendations based on lab results. ?

## 2021-10-17 ENCOUNTER — Other Ambulatory Visit: Payer: Self-pay | Admitting: Family Medicine

## 2021-10-17 DIAGNOSIS — E1142 Type 2 diabetes mellitus with diabetic polyneuropathy: Secondary | ICD-10-CM

## 2021-10-17 LAB — COMPREHENSIVE METABOLIC PANEL
ALT: 10 IU/L (ref 0–32)
AST: 22 IU/L (ref 0–40)
Albumin/Globulin Ratio: 1.8 (ref 1.2–2.2)
Albumin: 4.4 g/dL (ref 3.8–4.8)
Alkaline Phosphatase: 63 IU/L (ref 44–121)
BUN/Creatinine Ratio: 11 — ABNORMAL LOW (ref 12–28)
BUN: 16 mg/dL (ref 8–27)
Bilirubin Total: 0.5 mg/dL (ref 0.0–1.2)
CO2: 24 mmol/L (ref 20–29)
Calcium: 9.6 mg/dL (ref 8.7–10.3)
Chloride: 99 mmol/L (ref 96–106)
Creatinine, Ser: 1.49 mg/dL — ABNORMAL HIGH (ref 0.57–1.00)
Globulin, Total: 2.4 g/dL (ref 1.5–4.5)
Glucose: 123 mg/dL — ABNORMAL HIGH (ref 70–99)
Potassium: 4.2 mmol/L (ref 3.5–5.2)
Sodium: 140 mmol/L (ref 134–144)
Total Protein: 6.8 g/dL (ref 6.0–8.5)
eGFR: 37 mL/min/{1.73_m2} — ABNORMAL LOW (ref >59)

## 2021-10-17 LAB — LIPID PANEL
Chol/HDL Ratio: 2.8 ratio (ref 0.0–4.4)
Cholesterol, Total: 161 mg/dL (ref 100–199)
HDL: 58 mg/dL (ref >39)
LDL Chol Calc (NIH): 70 mg/dL (ref 0–99)
Triglycerides: 202 mg/dL — ABNORMAL HIGH (ref 0–149)
VLDL Cholesterol Cal: 33 mg/dL (ref 5–40)

## 2021-10-17 LAB — HEMOGLOBIN A1C
Est. average glucose Bld gHb Est-mCnc: 148 mg/dL
Hgb A1c MFr Bld: 6.8 % — ABNORMAL HIGH (ref 4.8–5.6)

## 2021-10-17 NOTE — Telephone Encounter (Signed)
Requested medication (s) are due for refill today:   Provider to review  Requested medication (s) are on the active medication list:   Yes  Future visit scheduled:   Yes   Last ordered: 07/17/2021 #270, 0 refills  Returned because non delegated refill.   Pt requesting a 90 day supply   Requested Prescriptions  Pending Prescriptions Disp Refills   pregabalin (LYRICA) 150 MG capsule [Pharmacy Med Name: pregabalin 150 mg capsule] 270 capsule 0    Sig: TAKE ONE CAPSULE BY MOUTH THREE TIMES DAILY     Not Delegated - Neurology:  Anticonvulsants - Controlled - pregabalin Failed - 10/17/2021  8:01 AM      Failed - This refill cannot be delegated      Failed - Cr in normal range and within 360 days    Creatinine, Ser  Date Value Ref Range Status  10/16/2021 1.49 (H) 0.57 - 1.00 mg/dL Final         Passed - Completed PHQ-2 or PHQ-9 in the last 360 days      Passed - Valid encounter within last 12 months    Recent Outpatient Visits           Yesterday Hypertension associated with diabetes ([place])   [facility] [name], NP   3 months ago Hypertension associated with diabetes ([place])   [facility] [name], NP   4 months ago BRBPR (bright red blood per rectum)   [facility] Vigg, Avanti, MD   5 months ago Acute cystitis without hematuria   [place], NP   6 months ago Hypertension associated with diabetes [facility])   [place] [name], NP       Future Appointments             In 2 months [name], NP [facility], [place]

## 2021-10-17 NOTE — Progress Notes (Signed)
Please let patient know that his kidney function is stable.  A1c remains 6.8. which is well controlled. Still recommend stopping the Glipizide and Lovastatin to see how she does.  Please let me know if she has any questions.

## 2021-11-12 ENCOUNTER — Telehealth: Payer: Self-pay

## 2021-11-12 MED ORDER — PANTOPRAZOLE SODIUM 40 MG PO TBEC: 40.0000 mg | DELAYED_RELEASE_TABLET | Freq: Every day | ORAL | 1 refills | Status: DC

## 2021-11-12 NOTE — Telephone Encounter (Signed)
[facility] to correct [facility] for Pantoprazole '20MG'$ , states original rx had "substitution permitted". Would like for provider to  write "No substituations" in that spot, please.

## 2021-11-12 NOTE — Telephone Encounter (Signed)
Updated prescription.

## 2021-11-20 ENCOUNTER — Other Ambulatory Visit: Payer: Self-pay | Admitting: Nurse Practitioner

## 2021-11-20 DIAGNOSIS — G8929 Other chronic pain: Secondary | ICD-10-CM

## 2021-11-20 NOTE — Telephone Encounter (Signed)
Requested medication (s) are due for refill today: yes  Requested medication (s) are on the active medication list: yes  Last refill:  09/29/21 #30/1  Future visit scheduled: yes  Notes to clinic:  Unable to refill per protocol, cannot delegate.      Requested Prescriptions  Pending Prescriptions Disp Refills   traMADol (ULTRAM) 50 MG tablet [Pharmacy Med Name: TRAMADOL HCL 50 MG TABLET] 30 tablet 1    Sig: TAKE 1 TABLET BY MOUTH EVERY 8 HOURS AS NEEDED     Not Delegated - Analgesics:  Opioid Agonists Failed - 11/20/2021 11:13 AM      Failed - This refill cannot be delegated      Failed - Urine Drug Screen completed in last 360 days      Passed - Valid encounter within last 3 months    Recent Outpatient Visits           1 month ago Hypertension associated with diabetes ([place])   [facility] [name], NP   4 months ago Hypertension associated with diabetes ([place])   [facility] [name], NP   5 months ago BRBPR (bright red blood per rectum)   [facility] Vigg, Avanti, MD   6 months ago Acute cystitis without hematuria   [place], NP   7 months ago Hypertension associated with diabetes [facility])   [place] [name], NP       Future Appointments             In 4 weeks [name], NP [facility], [place]

## 2021-12-17 NOTE — Progress Notes (Signed)
BP 136/82   Pulse (!) 53   Temp 97.9 F (36.6 C) (Oral)   Wt 284 lb 11.2 oz (129.1 kg)   SpO2 96%   BMI 52.07 kg/m    Subjective:    Patient ID: Anna Juarez, female    DOB: Nov 24, 1950, 71 y.o.   MRN: [id number]  HPI: Anna Juarez is a 71 y.o. female  Chief Complaint  Patient presents with   Hyperlipidemia   Diabetes   Hypertension    2 month follow up    Abdominal Pain    Abdominal pain after eating per patient. Pain starts LUQ, radiates to lower abdomen 1 hour after eating and then radiates to RLQ. Reports nausea. Denies diarrhea.    DIABETES Hypoglycemic episodes: no Polydipsia/polyuria: no Visual disturbance: no Chest pain: no Paresthesias: no Glucose Monitoring: yes  Accucheck frequency: Daily  Fasting glucose:   Post prandial:  Evening:  Before meals: Taking Insulin?: no  Long acting insulin:  Short acting insulin: Blood Pressure Monitoring: not checking Retinal Examination: Up to Date Foot Exam: Not up to Date Diabetic Education: Not Completed Pneumovax: Up to Date Influenza: Not up to Date Aspirin: no  HYPERTENSION / HYPERLIPIDEMIA Satisfied with current treatment? no Duration of hypertension: years BP monitoring frequency: not checking BP range:  BP medication side effects: no Past BP meds: benazepril Duration of hyperlipidemia: years Cholesterol medication side effects: no Cholesterol supplements: none Past cholesterol medications: lovastatin Medication compliance: excellent compliance Aspirin: no Recent stressors: no Recurrent headaches: no Visual changes: no Palpitations: no Dyspnea: no Chest pain: no Lower extremity edema: no Dizzy/lightheaded: no  CHRONIC KIDNEY DISEASE CKD status: controlled Medications renally dose: yes Previous renal evaluation: yes Pneumovax:  Up to Date Influenza Vaccine:  Not up to Date  STOMACH PAIN Patient states her stomach still hurts and stopping the glipizide did not help the symptoms.  Feels  like nausea and feels like someone is stabbing her.  Starts on the LUQ then moves across the stomach to the RUQ.  Bentyl did not improve symptoms.  Feels like it made her more sick.  Beef makes her have nausea and feels like she needs to throw up.  Has lost 10lbs in the last 3 months.   Relevant past medical, surgical, family and social history reviewed and updated as indicated. Interim medical history since our last visit reviewed. Allergies and medications reviewed and updated.  Review of Systems  Eyes:  Negative for visual disturbance.  Respiratory:  Negative for chest tightness and shortness of breath.   Cardiovascular:  Negative for chest pain, palpitations and leg swelling.  Gastrointestinal:  Positive for abdominal pain and nausea.  Endocrine: Negative for polydipsia and polyuria.  Musculoskeletal:        Hip pain  Neurological:  Negative for dizziness, light-headedness, numbness and headaches.  Psychiatric/Behavioral:  Negative for dysphoric mood and sleep disturbance.     Per HPI unless specifically indicated above     Objective:    BP 136/82   Pulse (!) 53   Temp 97.9 F (36.6 C) (Oral)   Wt 284 lb 11.2 oz (129.1 kg)   SpO2 96%   BMI 52.07 kg/m   Wt Readings from Last 3 Encounters:  12/18/21 284 lb 11.2 oz (129.1 kg)  10/16/21 294 lb 6.4 oz (133.5 kg)  09/09/21 295 lb (133.8 kg)    Physical Exam Vitals and nursing note reviewed.  Constitutional:      General: She is not in acute distress.  Appearance: Normal appearance. She is obese. She is not ill-appearing, toxic-appearing or diaphoretic.  HENT:     Head: Normocephalic.     Right Ear: External ear normal.     Left Ear: External ear normal.     Nose: Nose normal.     Mouth/Throat:     Mouth: Mucous membranes are moist.     Pharynx: Oropharynx is clear.  Eyes:     General:        Right eye: No discharge.        Left eye: No discharge.     Extraocular Movements: Extraocular movements intact.      Conjunctiva/sclera: Conjunctivae normal.     Pupils: Pupils are equal, round, and reactive to light.  Cardiovascular:     Rate and Rhythm: Normal rate and regular rhythm.     Heart sounds: No murmur heard. Pulmonary:     Effort: Pulmonary effort is normal. No respiratory distress.     Breath sounds: Normal breath sounds. No wheezing or rales.  Abdominal:     Tenderness: There is abdominal tenderness in the right upper quadrant. There is no right CVA tenderness or guarding. Negative signs include Murphy's sign.  Musculoskeletal:     Cervical back: Normal range of motion and neck supple.  Skin:    General: Skin is warm and dry.     Capillary Refill: Capillary refill takes less than 2 seconds.  Neurological:     General: No focal deficit present.     Mental Status: She is alert and oriented to person, place, and time. Mental status is at baseline.  Psychiatric:        Mood and Affect: Mood normal.        Behavior: Behavior normal.        Thought Content: Thought content normal.        Judgment: Judgment normal.     Comments: tearful     Results for orders placed or performed in visit on 10/16/21  Comp Met (CMET)  Result Value Ref Range   Glucose 123 (H) 70 - 99 mg/dL   BUN 16 8 - 27 mg/dL   Creatinine, Ser 1.49 (H) 0.57 - 1.00 mg/dL   eGFR 37 (L) >59 mL/min/1.73   BUN/Creatinine Ratio 11 (L) 12 - 28   Sodium 140 134 - 144 mmol/L   Potassium 4.2 3.5 - 5.2 mmol/L   Chloride 99 96 - 106 mmol/L   CO2 24 20 - 29 mmol/L   Calcium 9.6 8.7 - 10.3 mg/dL   Total Protein 6.8 6.0 - 8.5 g/dL   Albumin 4.4 3.8 - 4.8 g/dL   Globulin, Total 2.4 1.5 - 4.5 g/dL   Albumin/Globulin Ratio 1.8 1.2 - 2.2   Bilirubin Total 0.5 0.0 - 1.2 mg/dL   Alkaline Phosphatase 63 44 - 121 IU/L   AST 22 0 - 40 IU/L   ALT 10 0 - 32 IU/L  Lipid Profile  Result Value Ref Range   Cholesterol, Total 161 100 - 199 mg/dL   Triglycerides 202 (H) 0 - 149 mg/dL   HDL 58 >39 mg/dL   VLDL Cholesterol Cal 33 5 - 40  mg/dL   LDL Chol Calc (NIH) 70 0 - 99 mg/dL   Chol/HDL Ratio 2.8 0.0 - 4.4 ratio  HgB A1c  Result Value Ref Range   Hgb A1c MFr Bld 6.8 (H) 4.8 - 5.6 %   Est. average glucose Bld gHb Est-mCnc 148 mg/dL  Assessment & Plan:   Problem List Items Addressed This Visit       Cardiovascular and Mediastinum   Hypertension associated with diabetes ([place]) - Primary    Chronic.  Controlled.  Continue with current medication regimen of Benzapril 53m daily.  Labs ordered today.  Return to clinic in 3 months for reevaluation.  Call sooner if concerns arise.        Relevant Medications   dapagliflozin propanediol (FARXIGA) 10 MG TABS tablet   benazepril (LOTENSIN) 40 MG tablet   Other Relevant Orders   Comp Met (CMET)     Respiratory   Sleep apnea    New order placed for repeat sleep study and needs new supplies ordered.       Relevant Orders   Ambulatory referral to Sleep Studies     Endocrine   DM type 2 with diabetic peripheral neuropathy (HCC)    Chronic.  Controlled.  Continue with current medication regimen.  Labs ordered today.  Return to clinic in 3 months for reevaluation.  Call sooner if concerns arise.        Relevant Medications   dapagliflozin propanediol (FARXIGA) 10 MG TABS tablet   benazepril (LOTENSIN) 40 MG tablet   traZODone (DESYREL) 100 MG tablet   Hyperlipidemia associated with type 2 diabetes mellitus (HCC)    Chronic.  Controlled.  Labs ordered today.  Return to clinic in 3 months for reevaluation.  Call sooner if concerns arise.        Relevant Medications   dapagliflozin propanediol (FARXIGA) 10 MG TABS tablet   benazepril (LOTENSIN) 40 MG tablet   Other Relevant Orders   Lipid Profile   Diabetes mellitus without complication (HCC)    Chronic.  Controlled.  A1c is 6.9.  Continue with current medication regimen on Farxiga 126mdaily.  Will restart Glipizide if A1c increases.  No difference in abdominal pain since stopping medication.  Labs ordered  today.  Return to clinic in 3 months for reevaluation.  Call sooner if concerns arise.        Relevant Medications   dapagliflozin propanediol (FARXIGA) 10 MG TABS tablet   benazepril (LOTENSIN) 40 MG tablet   Other Relevant Orders   HgB A1c     Genitourinary   CKD (chronic kidney disease) stage 3, GFR 30-59 ml/min (HCC)    Labs ordered today. Will make recommendations based on lab results.  Followed by Dr. LaHolley Raring       Other   Back pain    Controlled with PRN Tramadol.      Relevant Medications   traMADol (ULTRAM) 50 MG tablet   Generalized abdominal pain    Chronic.  Ongoing x several months.  Had Cholecystectomy in November 2022.  Has had ongoing abdominal pain that has not improved since surgery.  Nausea and stabbing pain even after drinking water.  Has lost 25lbs in the last 6 months.  Had Colonoscopy and CT which were unremarkable for symptoms.  Discharged from GI. Symptoms not improved with stopping Lovastatin and Glipizide.  Will place new referral for second opinion with GI.  Zofran given to see if symptoms improve.  Follow up in 1 month.      Other Visit Diagnoses     Left lower quadrant abdominal pain       Relevant Orders   Alpha-Gal Panel   Lyme Disease Serology w/Reflex   Rocky mtn spotted fvr abs pnl(IgG+IgM)   Ambulatory referral to Gastroenterology   Essential hypertension  Relevant Medications   benazepril (LOTENSIN) 40 MG tablet        Follow up plan: Return in about 1 month (around 01/17/2022) for Abdominal Pain and , BP Check.

## 2021-12-18 ENCOUNTER — Encounter: Payer: Self-pay | Admitting: Nurse Practitioner

## 2021-12-18 ENCOUNTER — Ambulatory Visit: Payer: Medicare PPO | Admitting: Nurse Practitioner

## 2021-12-18 VITALS — BP 136/82 | HR 53 | Temp 97.9°F | Wt 284.7 lb

## 2021-12-18 DIAGNOSIS — E119 Type 2 diabetes mellitus without complications: Secondary | ICD-10-CM | POA: Diagnosis not present

## 2021-12-18 DIAGNOSIS — G8929 Other chronic pain: Secondary | ICD-10-CM

## 2021-12-18 DIAGNOSIS — Z6841 Body Mass Index (BMI) 40.0 and over, adult: Secondary | ICD-10-CM

## 2021-12-18 DIAGNOSIS — E1159 Type 2 diabetes mellitus with other circulatory complications: Secondary | ICD-10-CM | POA: Diagnosis not present

## 2021-12-18 DIAGNOSIS — N1832 Chronic kidney disease, stage 3b: Secondary | ICD-10-CM

## 2021-12-18 DIAGNOSIS — I10 Essential (primary) hypertension: Secondary | ICD-10-CM

## 2021-12-18 DIAGNOSIS — E1142 Type 2 diabetes mellitus with diabetic polyneuropathy: Secondary | ICD-10-CM | POA: Diagnosis not present

## 2021-12-18 DIAGNOSIS — M545 Low back pain, unspecified: Secondary | ICD-10-CM

## 2021-12-18 DIAGNOSIS — G4730 Sleep apnea, unspecified: Secondary | ICD-10-CM

## 2021-12-18 DIAGNOSIS — I152 Hypertension secondary to endocrine disorders: Secondary | ICD-10-CM

## 2021-12-18 DIAGNOSIS — E1169 Type 2 diabetes mellitus with other specified complication: Secondary | ICD-10-CM

## 2021-12-18 DIAGNOSIS — R1084 Generalized abdominal pain: Secondary | ICD-10-CM

## 2021-12-18 DIAGNOSIS — E785 Hyperlipidemia, unspecified: Secondary | ICD-10-CM

## 2021-12-18 DIAGNOSIS — R1032 Left lower quadrant pain: Secondary | ICD-10-CM

## 2021-12-18 MED ORDER — ONDANSETRON 4 MG PO TBDP: 4.0000 mg | ORAL_TABLET | Freq: Three times a day (TID) | ORAL | 0 refills | Status: DC | PRN

## 2021-12-18 MED ORDER — TRAMADOL HCL 50 MG PO TABS: 50.0000 mg | ORAL_TABLET | Freq: Three times a day (TID) | ORAL | 1 refills | Status: DC | PRN

## 2021-12-18 MED ORDER — TRAZODONE HCL 100 MG PO TABS: 100.0000 mg | ORAL_TABLET | Freq: Every evening | ORAL | 1 refills | Status: DC | PRN

## 2021-12-18 MED ORDER — DAPAGLIFLOZIN PROPANEDIOL 10 MG PO TABS: 10.0000 mg | ORAL_TABLET | Freq: Every day | ORAL | 1 refills | Status: DC

## 2021-12-18 MED ORDER — BENAZEPRIL HCL 40 MG PO TABS: 40.0000 mg | ORAL_TABLET | Freq: Every day | ORAL | 1 refills | Status: DC

## 2021-12-18 NOTE — Assessment & Plan Note (Signed)
New order placed for repeat sleep study and needs new supplies ordered.

## 2021-12-18 NOTE — Assessment & Plan Note (Signed)
Chronic.  Controlled.  Labs ordered today.  Return to clinic in 3 months for reevaluation.  Call sooner if concerns arise.   

## 2021-12-18 NOTE — Assessment & Plan Note (Signed)
Chronic.  Ongoing x several months.  Had Cholecystectomy in November 2022.  Has had ongoing abdominal pain that has not improved since surgery.  Nausea and stabbing pain even after drinking water.  Has lost 25lbs in the last 6 months.  Had Colonoscopy and CT which were unremarkable for symptoms.  Discharged from GI. Symptoms not improved with stopping Lovastatin and Glipizide.  Will place new referral for second opinion with GI.  Zofran given to see if symptoms improve.  Follow up in 1 month.

## 2021-12-18 NOTE — Assessment & Plan Note (Signed)
Chronic.  Controlled.  A1c is 6.9.  Continue with current medication regimen on Farxiga '10mg'$  daily.  Will restart Glipizide if A1c increases.  No difference in abdominal pain since stopping medication.  Labs ordered today.  Return to clinic in 3 months for reevaluation.  Call sooner if concerns arise.

## 2021-12-18 NOTE — Assessment & Plan Note (Signed)
Chronic.  Controlled.  Continue with current medication regimen of Benzapril 40mg daily.  Labs ordered today.  Return to clinic in 3 months for reevaluation.  Call sooner if concerns arise.   

## 2021-12-18 NOTE — Assessment & Plan Note (Signed)
Chronic.  Controlled.  Continue with current medication regimen.  Labs ordered today.  Return to clinic in 3 months for reevaluation.  Call sooner if concerns arise.   

## 2021-12-18 NOTE — Assessment & Plan Note (Signed)
Labs ordered today. Will make recommendations based on lab results.  Followed by Dr. Holley Raring.

## 2021-12-18 NOTE — Assessment & Plan Note (Signed)
Controlled with PRN Tramadol.

## 2021-12-23 MED ORDER — PANTOPRAZOLE SODIUM 40 MG PO TBEC: 40.0000 mg | DELAYED_RELEASE_TABLET | Freq: Two times a day (BID) | ORAL | 1 refills | Status: DC

## 2021-12-23 MED ORDER — SUCRALFATE 1 G PO TABS: 1.0000 g | ORAL_TABLET | Freq: Three times a day (TID) | ORAL | 1 refills | Status: DC

## 2021-12-23 NOTE — Progress Notes (Signed)
Please let patient know that her lab work shows that her kidney function bumped up again.  This is likely because she isn't well hydrated due to even water causing her pain.  With that said.  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  I would like her to avoid animal products if possible. I am also going to change her pantoprazole to '40mg'$  twice daily.  I know she has tried the carafate before but I want her to take it three times daily with the increased dose of pantroprazole to see if this improves her pain.  I have sent updated prescriptions to the pharmacy for these.

## 2021-12-23 NOTE — Addendum Note (Signed)
Addended by: [name] on: 12/23/2021 12:55 PM   Modules accepted: Orders

## 2021-12-24 LAB — COMPREHENSIVE METABOLIC PANEL
ALT: 11 IU/L (ref 0–32)
AST: 19 IU/L (ref 0–40)
Albumin/Globulin Ratio: 1.7 (ref 1.2–2.2)
Albumin: 4.7 g/dL (ref 3.8–4.8)
Alkaline Phosphatase: 65 IU/L (ref 44–121)
BUN/Creatinine Ratio: 7 — ABNORMAL LOW (ref 12–28)
BUN: 12 mg/dL (ref 8–27)
Bilirubin Total: 0.6 mg/dL (ref 0.0–1.2)
CO2: 24 mmol/L (ref 20–29)
Calcium: 10.2 mg/dL (ref 8.7–10.3)
Chloride: 102 mmol/L (ref 96–106)
Creatinine, Ser: 1.68 mg/dL — ABNORMAL HIGH (ref 0.57–1.00)
Globulin, Total: 2.7 g/dL (ref 1.5–4.5)
Glucose: 134 mg/dL — ABNORMAL HIGH (ref 70–99)
Potassium: 4.5 mmol/L (ref 3.5–5.2)
Sodium: 145 mmol/L — ABNORMAL HIGH (ref 134–144)
Total Protein: 7.4 g/dL (ref 6.0–8.5)
eGFR: 32 mL/min/{1.73_m2} — ABNORMAL LOW (ref >59)

## 2021-12-24 LAB — ALPHA-GAL PANEL
Allergen Lamb IgE: <0.1 kU/L
Beef IgE: <0.1 kU/L
IgE (Immunoglobulin E), Serum: 40 IU/mL (ref 6–495)
O215-IgE Alpha-Gal: 0.13 kU/L — AB
Pork IgE: <0.1 kU/L

## 2021-12-24 LAB — LIPID PANEL
Chol/HDL Ratio: 4.4 ratio (ref 0.0–4.4)
Cholesterol, Total: 218 mg/dL — ABNORMAL HIGH (ref 100–199)
HDL: 49 mg/dL (ref >39)
LDL Chol Calc (NIH): 139 mg/dL — ABNORMAL HIGH (ref 0–99)
Triglycerides: 166 mg/dL — ABNORMAL HIGH (ref 0–149)
VLDL Cholesterol Cal: 30 mg/dL (ref 5–40)

## 2021-12-24 LAB — LYME DISEASE SEROLOGY W/REFLEX: Lyme Total Antibody EIA: NEGATIVE

## 2021-12-24 LAB — HEMOGLOBIN A1C
Est. average glucose Bld gHb Est-mCnc: 151 mg/dL
Hgb A1c MFr Bld: 6.9 % — ABNORMAL HIGH (ref 4.8–5.6)

## 2021-12-24 LAB — ROCKY MTN SPOTTED FVR ABS PNL(IGG+IGM)
RMSF IgG: NEGATIVE
RMSF IgM: 1.06 index — ABNORMAL HIGH (ref 0.00–0.89)

## 2021-12-24 MED ORDER — DOXYCYCLINE HYCLATE 100 MG PO TABS: 100.0000 mg | ORAL_TABLET | Freq: Two times a day (BID) | ORAL | 0 refills | Status: DC

## 2021-12-24 NOTE — Progress Notes (Signed)
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  I have sent in a prescription to treat her for this.  It will be doxycyline '100mg'$  twice daily for 3 weeks.  I am hopeful this will help with her pain.

## 2021-12-24 NOTE — Addendum Note (Signed)
Addended by: [name] on: 12/24/2021 01:02 PM   Modules accepted: Orders

## 2022-01-06 ENCOUNTER — Ambulatory Visit: Payer: Medicare PPO | Admitting: Nurse Practitioner

## 2022-01-06 ENCOUNTER — Encounter: Payer: Self-pay | Admitting: Nurse Practitioner

## 2022-01-06 VITALS — BP 130/80 | HR 86 | Temp 98.8°F | Wt 282.4 lb

## 2022-01-06 DIAGNOSIS — M545 Low back pain, unspecified: Secondary | ICD-10-CM | POA: Diagnosis not present

## 2022-01-06 DIAGNOSIS — G8929 Other chronic pain: Secondary | ICD-10-CM

## 2022-01-06 DIAGNOSIS — R1084 Generalized abdominal pain: Secondary | ICD-10-CM | POA: Diagnosis not present

## 2022-01-06 MED ORDER — ONDANSETRON HCL 4 MG PO TABS: 4.0000 mg | ORAL_TABLET | Freq: Four times a day (QID) | ORAL | 0 refills | Status: DC

## 2022-01-06 MED ORDER — TRAMADOL HCL 50 MG PO TABS: 50.0000 mg | ORAL_TABLET | Freq: Three times a day (TID) | ORAL | 1 refills | Status: DC | PRN

## 2022-01-06 NOTE — Progress Notes (Unsigned)
BP 130/80   Pulse 86   Temp 98.8 F (37.1 C) (Oral)   Wt 282 lb 6.4 oz (128.1 kg)   SpO2 96%   BMI 51.65 kg/m    Subjective:    Patient ID: Anna Juarez, female    DOB: [birth year], 71 y.o.   MRN: [id number]  HPI: Anna Juarez is a 71 y.o. female  Chief Complaint  Patient presents with   Follow-up    Patient reports she has different diagnosis other than lyme disease. Denies emesis/diarrhea. Reports nausea. Pt reports smell of food was unbearable, abdominal discomfort radiating to different regions.     Patient states her pain has moved.  She has pain when she props her hands on her belly, when the seat belt touches her belly.  She is still having nausea.  Any taste or smell makes her sick.  If she lays down or sits without moving then the nausea eases. Eating does not bother her as much as drinking.  Feels like a burning pain.  Feels like she isn't sure if the lyrica helps her symptoms.  Tramadol used to help her pain but no longer helping.   States she will taste things that she normally enjoys eating and they taste terrible.  She may go 5 days without having a bowel movement.  She takes a bite or two of food and then is so full or feels like her stomach is so swollen.    Relevant past medical, surgical, family and social history reviewed and updated as indicated. Interim medical history since our last visit reviewed. Allergies and medications reviewed and updated.  Review of Systems  Eyes:  Negative for visual disturbance.  Respiratory:  Negative for chest tightness and shortness of breath.   Cardiovascular:  Negative for chest pain, palpitations and leg swelling.  Gastrointestinal:  Positive for abdominal pain, constipation and nausea.  Endocrine: Negative for polydipsia and polyuria.  Musculoskeletal:        Hip pain  Neurological:  Negative for dizziness, light-headedness, numbness and headaches.  Psychiatric/Behavioral:  Negative for dysphoric mood and sleep  disturbance.        Tearful    Per HPI unless specifically indicated above     Objective:    BP 130/80   Pulse 86   Temp 98.8 F (37.1 C) (Oral)   Wt 282 lb 6.4 oz (128.1 kg)   SpO2 96%   BMI 51.65 kg/m   Wt Readings from Last 3 Encounters:  01/06/22 282 lb 6.4 oz (128.1 kg)  12/18/21 284 lb 11.2 oz (129.1 kg)  10/16/21 294 lb 6.4 oz (133.5 kg)    Physical Exam Vitals and nursing note reviewed.  Constitutional:      General: She is not in acute distress.    Appearance: Normal appearance. She is obese. She is not ill-appearing, toxic-appearing or diaphoretic.  HENT:     Head: Normocephalic.     Right Ear: External ear normal.     Left Ear: External ear normal.     Nose: Nose normal.     Mouth/Throat:     Mouth: Mucous membranes are moist.     Pharynx: Oropharynx is clear.  Eyes:     General:        Right eye: No discharge.        Left eye: No discharge.     Extraocular Movements: Extraocular movements intact.     Conjunctiva/sclera: Conjunctivae normal.     Pupils: Pupils are equal,  round, and reactive to light.  Cardiovascular:     Rate and Rhythm: Normal rate and regular rhythm.     Heart sounds: No murmur heard. Pulmonary:     Effort: Pulmonary effort is normal. No respiratory distress.     Breath sounds: Normal breath sounds. No wheezing or rales.  Abdominal:     Tenderness: There is abdominal tenderness in the right upper quadrant. There is no right CVA tenderness or guarding. Negative signs include Murphy's sign.    Musculoskeletal:     Cervical back: Normal range of motion and neck supple.  Skin:    General: Skin is warm and dry.     Capillary Refill: Capillary refill takes less than 2 seconds.  Neurological:     General: No focal deficit present.     Mental Status: She is alert and oriented to person, place, and time. Mental status is at baseline.  Psychiatric:        Mood and Affect: Mood normal.        Behavior: Behavior normal.        Thought  Content: Thought content normal.        Judgment: Judgment normal.     Comments: tearful     Results for orders placed or performed in visit on 12/18/21  Lipid Profile  Result Value Ref Range   Cholesterol, Total 218 (H) 100 - 199 mg/dL   Triglycerides 166 (H) 0 - 149 mg/dL   HDL 49 >39 mg/dL   VLDL Cholesterol Cal 30 5 - 40 mg/dL   LDL Chol Calc (NIH) 139 (H) 0 - 99 mg/dL   Chol/HDL Ratio 4.4 0.0 - 4.4 ratio  HgB A1c  Result Value Ref Range   Hgb A1c MFr Bld 6.9 (H) 4.8 - 5.6 %   Est. average glucose Bld gHb Est-mCnc 151 mg/dL  Alpha-Gal Panel  Result Value Ref Range   Class Description Allergens Comment    IgE (Immunoglobulin E), Serum 40 6 - 495 IU/mL   O215-IgE Alpha-Gal 0.13 (A) Class 0/I kU/L   Beef IgE <0.10 Class 0 kU/L   Pork IgE <0.10 Class 0 kU/L   Allergen Lamb IgE <0.10 Class 0 kU/L  Rocky mtn spotted fvr abs pnl(IgG+IgM)  Result Value Ref Range   RMSF IgG Negative Negative   RMSF IgM 1.06 (H) 0.00 - 0.89 index  Comprehensive metabolic panel  Result Value Ref Range   Glucose 134 (H) 70 - 99 mg/dL   BUN 12 8 - 27 mg/dL   Creatinine, Ser 1.68 (H) 0.57 - 1.00 mg/dL   eGFR 32 (L) >59 mL/min/1.73   BUN/Creatinine Ratio 7 (L) 12 - 28   Sodium 145 (H) 134 - 144 mmol/L   Potassium 4.5 3.5 - 5.2 mmol/L   Chloride 102 96 - 106 mmol/L   CO2 24 20 - 29 mmol/L   Calcium 10.2 8.7 - 10.3 mg/dL   Total Protein 7.4 6.0 - 8.5 g/dL   Albumin 4.7 3.8 - 4.8 g/dL   Globulin, Total 2.7 1.5 - 4.5 g/dL   Albumin/Globulin Ratio 1.7 1.2 - 2.2   Bilirubin Total 0.6 0.0 - 1.2 mg/dL   Alkaline Phosphatase 65 44 - 121 IU/L   AST 19 0 - 40 IU/L   ALT 11 0 - 32 IU/L  Lyme Disease Serology w/Reflex  Result Value Ref Range   Lyme Total Antibody EIA Negative Negative      Assessment & Plan:   Problem List Items Addressed This Visit  Other   Back pain   Relevant Medications   traMADol (ULTRAM) 50 MG tablet   Generalized abdominal pain - Primary    Chronic.  Ongoing x  several months.  Etiology unclear.  Referral to [facility] was denied for second opinion.  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  Based on Dr. Elwyn Lade recommendation will send to Vascular for Doppler study to avoid contrast due to CKD.  Had Cholecystectomy in November 2022.  Has had ongoing abdominal pain that has not improved since surgery.  Nausea and stabbing pain even after drinking water.  Has lost 25lbs in the last 6 months.  Had Colonoscopy and CT which were unremarkable for symptoms.  Discharged from GI. Symptoms not improved with stopping Lovastatin and Glipizide.  Zofran does help some.  Pantoprazole and Sucralfate did not improve symptoms.  Lyme was slightly elevated.  Treated with Doxycyline given symptoms but they did not improve.   Zofran given to see if symptoms improve.  Will follow up after imaging.        Relevant Orders   DG Abd 2 Views   Ambulatory referral to Vascular Surgery     Follow up plan: Return if symptoms worsen or fail to improve.

## 2022-01-07 ENCOUNTER — Other Ambulatory Visit (INDEPENDENT_AMBULATORY_CARE_PROVIDER_SITE_OTHER): Payer: Self-pay | Admitting: Nurse Practitioner

## 2022-01-07 ENCOUNTER — Encounter: Payer: Self-pay | Admitting: Nurse Practitioner

## 2022-01-07 ENCOUNTER — Ambulatory Visit
Admission: RE | Admit: 2022-01-07 | Discharge: 2022-01-07 | Disposition: A | Discharge status: Home / Self Care | Payer: Medicare PPO | Source: Ambulatory Visit | Attending: Nurse Practitioner | Admitting: Nurse Practitioner

## 2022-01-07 ENCOUNTER — Ambulatory Visit
Admission: RE | Admit: 2022-01-07 | Discharge: 2022-01-07 | Disposition: A | Discharge status: Home / Self Care | Payer: Medicare PPO | Attending: Nurse Practitioner | Admitting: Nurse Practitioner

## 2022-01-07 ENCOUNTER — Telehealth: Payer: Self-pay | Admitting: Nurse Practitioner

## 2022-01-07 DIAGNOSIS — R1084 Generalized abdominal pain: Secondary | ICD-10-CM

## 2022-01-07 DIAGNOSIS — K551 Chronic vascular disorders of intestine: Secondary | ICD-10-CM | POA: Insufficient documentation

## 2022-01-07 MED ORDER — MAGNESIUM CITRATE PO SOLN: 1.0000 | Freq: Once | ORAL | 0 refills | Status: AC

## 2022-01-07 NOTE — Addendum Note (Signed)
Addended by: [name] on: 01/07/2022 11:27 AM   Modules accepted: Orders

## 2022-01-07 NOTE — Progress Notes (Deleted)
MRN : [id number]  Anna Juarez is a 71 y.o. ([birth year]) female who presents with chief complaint of check circulation.  History of Present Illness:   I am asked to evaluate the patient for the complaint of abdominal pain with uncertain etiology.  The patient is followed the medical service, who is concerned about mesenteric ischemia.  The patient has noted some weight loss as well as nausea.  The patient does substantiate food fear, particular foods do not seem to aggravate or alleviate the symptoms.  The patient denies bloody bowel movements or diarrhea.  The patient has a history of colonoscopy which was not diagnostic.  No history of peptic ulcer disease.   No prior peripheral angiograms or vascular interventions.  The patient denies amaurosis fugax or recent TIA symptoms. There are no recent neurological changes noted. The patient denies claudication symptoms or rest pain symptoms. The patient denies history of DVT, PE or superficial thrombophlebitis. The patient denies recent episodes of angina    No outpatient medications have been marked as taking for the 01/08/22 encounter (Appointment) with Delana Meyer, Dolores Lory, MD.    Past Medical History:  Diagnosis Date   Arthritis    Chronic kidney disease    Complication of anesthesia    Diabetes mellitus without complication ([place])    Extreme obesity    Fatty liver    GERD (gastroesophageal reflux disease)    Hyperlipidemia    Hypertension    Palpitations    PONV (postoperative nausea and vomiting)    Radiculopathy, cervical    Renal insufficiency    stage 3   Sleep apnea    uses cpap   Tear of acetabular labrum 01/2021   right    Past Surgical History:  Procedure Laterality Date   ABDOMINAL HYSTERECTOMY     CATARACT EXTRACTION W/PHACO Right 06/02/2021   Procedure: CATARACT EXTRACTION PHACO AND INTRAOCULAR LENS PLACEMENT ([place]) RIGHT DIABETIC;  Surgeon: Eulogio Bear, MD;  Location: [place];  Service: Ophthalmology;  Laterality: Right;  2.52 0:22.8   CATARACT EXTRACTION W/PHACO Left 06/16/2021   Procedure: CATARACT EXTRACTION PHACO AND INTRAOCULAR LENS PLACEMENT ([facility]) LEFT DIABETIC 3.51 00:30.9;  Surgeon: Eulogio Bear, MD;  Location: [place];  Service: Ophthalmology;  Laterality: Left;   CERVICAL FUSION     x2   CHOLECYSTECTOMY     COLONOSCOPY     COLONOSCOPY WITH PROPOFOL N/A 09/09/2021   Procedure: COLONOSCOPY WITH PROPOFOL;  Surgeon: Lucilla Lame, MD;  Location: [facility] ENDOSCOPY;  Service: Endoscopy;  Laterality: N/A;   ESOPHAGOGASTRODUODENOSCOPY (EGD) WITH PROPOFOL N/A 09/09/2021   Procedure: ESOPHAGOGASTRODUODENOSCOPY (EGD) WITH PROPOFOL;  Surgeon: Lucilla Lame, MD;  Location: [facility] ENDOSCOPY;  Service: Endoscopy;  Laterality: N/A;   JOINT REPLACEMENT Bilateral    knees    Social History Social History   Tobacco Use   Smoking status: Never    Passive exposure: Never   Smokeless tobacco: Never  Vaping Use   Vaping Use: Never used  Substance Use Topics   Alcohol use: No   Drug use: No    Family History Family History  Problem Relation Age of Onset   Diabetes Mother    Heart disease Mother    Hypertension Mother    Heart disease Father    Cancer Brother    Hypertension Brother    Breast cancer Maternal Aunt    Breast cancer Paternal Aunt  Allergies  Allergen Reactions   Actos [Pioglitazone] Nausea And Vomiting   Metformin And Related Diarrhea   Scopolamine Nausea And Vomiting   Victoza [Liraglutide] Nausea And Vomiting     REVIEW OF SYSTEMS (Negative unless checked)  Constitutional: '[]'$ Weight loss  '[]'$ Fever  '[]'$ Chills Cardiac: '[]'$ Chest pain   '[]'$ Chest pressure   '[]'$ Palpitations   '[]'$ Shortness of breath when laying flat   '[]'$ Shortness of breath with exertion. Vascular:  '[x]'$ Pain in legs with walking   '[]'$ Pain in legs at rest  '[]'$ History of DVT   '[]'$ Phlebitis   '[]'$ Swelling in legs   '[]'$ Varicose veins   '[]'$ Non-healing ulcers Pulmonary:   '[]'$ Uses  home oxygen   '[]'$ Productive cough   '[]'$ Hemoptysis   '[]'$ Wheeze  '[]'$ COPD   '[]'$ Asthma Neurologic:  '[]'$ Dizziness   '[]'$ Seizures   '[]'$ History of stroke   '[]'$ History of TIA  '[]'$ Aphasia   '[]'$ Vissual changes   '[]'$ Weakness or numbness in arm   '[]'$ Weakness or numbness in leg Musculoskeletal:   '[]'$ Joint swelling   '[]'$ Joint pain   '[]'$ Low back pain Hematologic:  '[]'$ Easy bruising  '[]'$ Easy bleeding   '[]'$ Hypercoagulable state   '[]'$ Anemic Gastrointestinal:  '[]'$ Diarrhea   '[]'$ Vomiting  '[x]'$ Gastroesophageal reflux/heartburn   '[]'$ Difficulty swallowing. Genitourinary:  '[]'$ Chronic kidney disease   '[]'$ Difficult urination  '[]'$ Frequent urination   '[]'$ Blood in urine Skin:  '[]'$ Rashes   '[]'$ Ulcers  Psychological:  '[]'$ History of anxiety   '[]'$  History of major depression.  Physical Examination  There were no vitals filed for this visit. There is no height or weight on file to calculate BMI. Gen: WD/WN, NAD Head: [place]/AT, No temporalis wasting.  Ear/Nose/Throat: Hearing grossly intact, nares w/o erythema or drainage Eyes: PER, EOMI, sclera nonicteric.  Neck: Supple, no masses.  No bruit or JVD.  Pulmonary:  Good air movement, no audible wheezing, no use of accessory muscles.  Cardiac: RRR, normal S1, S2, no Murmurs. Vascular:  mild trophic changes, no open wounds Vessel Right Left  Radial Palpable Palpable  PT Not Palpable Not Palpable  DP Not Palpable Not Palpable  Gastrointestinal: soft, non-distended. No guarding/no peritoneal signs.  Musculoskeletal: M/S 5/5 throughout.  No visible deformity.  Neurologic: CN 2-12 intact. Pain and light touch intact in extremities.  Symmetrical.  Speech is fluent. Motor exam as listed above. Psychiatric: Judgment intact, Mood & affect appropriate for pt's clinical situation. Dermatologic: No rashes or ulcers noted.  No changes consistent with cellulitis.   CBC Lab Results  Component Value Date   WBC 6.9 06/10/2021   HGB 13.9 06/10/2021   HCT 42.0 06/10/2021   MCV 90 06/10/2021   PLT 169 06/10/2021     BMET    Component Value Date/Time   NA 145 (H) 12/18/2021 1141   K 4.5 12/18/2021 1141   CL 102 12/18/2021 1141   CO2 24 12/18/2021 1141   GLUCOSE 134 (H) 12/18/2021 1141   GLUCOSE 182 (H) 09/16/2021 1119   BUN 12 12/18/2021 1141   CREATININE 1.68 (H) 12/18/2021 1141   CALCIUM 10.2 12/18/2021 1141   GFRNONAA 43 (L) 09/16/2021 1119   GFRAA 55 (L) 05/17/2020 1337   Estimated Creatinine Clearance: 38.3 mL/min (A) (by C-G formula based on SCr of 1.68 mg/dL (H)).  COAG No results found for: "INR", "PROTIME"  Radiology DG Abd 2 Views  Result Date: 01/07/2022 CLINICAL DATA:  Abdominal pain EXAM: ABDOMEN - 2 VIEW COMPARISON:  Abdominal radiograph dated December 21, 2013 FINDINGS: The bowel gas pattern is normal. Large stool burden. There is no evidence of free air. Pelvic calcifications  which are likely phleboliths. Mild dextrocurvature and moderate degenerative changes of the lumbar spine. IMPRESSION: Nonobstructive bowel gas pattern. Large stool burden. Electronically Signed   By: Yetta Glassman M.D.   On: 01/07/2022 11:54     Assessment/Plan There are no diagnoses linked to this encounter.   Hortencia Pilar, MD  01/07/2022 1:19 PM

## 2022-01-07 NOTE — Assessment & Plan Note (Addendum)
Chronic.  Ongoing x several months.  Etiology unclear.  Referral to [facility] was denied for second opinion.  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  Based on Dr. Elwyn Lade recommendation will send to Vascular for Doppler study to avoid contrast due to CKD.  Had Cholecystectomy in November 2022.  Has had ongoing abdominal pain that has not improved since surgery.  Nausea and stabbing pain even after drinking water.  Has lost 25lbs in the last 6 months.  Had Colonoscopy and CT which were unremarkable for symptoms.  Discharged from GI. Symptoms not improved with stopping Lovastatin and Glipizide.  Zofran does help some.  Pantoprazole and Sucralfate did not improve symptoms.  Lyme was slightly elevated.  Treated with Doxycyline given symptoms but they did not improve.   Zofran given to see if symptoms improve.  Will follow up after imaging.

## 2022-01-07 NOTE — Addendum Note (Signed)
Addended by: [name] on: 01/07/2022 03:04 PM   Modules accepted: Orders

## 2022-01-07 NOTE — Progress Notes (Signed)
Results given to patient over the phone.  Will treat with magnesium citrate.

## 2022-01-07 NOTE — Telephone Encounter (Signed)
Can you please call [facility] to see if this patinet can get in for an appt.  I am concerned about Mesenteric Ischemia. She has CKD stage 3 and Dr Holley Raring recommended a Doppler with vein and vascular to avoid contrast if possible.

## 2022-01-08 ENCOUNTER — Encounter (INDEPENDENT_AMBULATORY_CARE_PROVIDER_SITE_OTHER): Payer: Medicare PPO | Admitting: Vascular Surgery

## 2022-01-08 ENCOUNTER — Encounter (INDEPENDENT_AMBULATORY_CARE_PROVIDER_SITE_OTHER): Payer: Medicare PPO

## 2022-01-08 DIAGNOSIS — K551 Chronic vascular disorders of intestine: Secondary | ICD-10-CM

## 2022-01-08 DIAGNOSIS — E1142 Type 2 diabetes mellitus with diabetic polyneuropathy: Secondary | ICD-10-CM

## 2022-01-08 DIAGNOSIS — N1832 Chronic kidney disease, stage 3b: Secondary | ICD-10-CM

## 2022-01-08 DIAGNOSIS — E1169 Type 2 diabetes mellitus with other specified complication: Secondary | ICD-10-CM

## 2022-01-09 ENCOUNTER — Telehealth: Payer: Medicare PPO

## 2022-01-12 ENCOUNTER — Other Ambulatory Visit: Payer: Self-pay | Admitting: Nurse Practitioner

## 2022-01-13 NOTE — Telephone Encounter (Signed)
Requested medication (s) are due for refill today: no  Requested medication (s) are on the active medication list:yes  Last refill:  10/3/2  Future visit scheduled: yes  Notes to clinic:  Unable to refill per protocol, cannot delegate.  Medication not assigned to a protocol, review manually.     Requested Prescriptions  Pending Prescriptions Disp Refills   ondansetron (ZOFRAN) 4 MG tablet [Pharmacy Med Name: ONDANSETRON HCL 4 MG TABLET] 18 tablet 1    Sig: TAKE 1 TABLET BY MOUTH EVERY 6 HOURS.     Not Delegated - Gastroenterology: Antiemetics - ondansetron Failed - 01/12/2022 12:41 PM      Failed - This refill cannot be delegated      Passed - AST in normal range and within 360 days    AST  Date Value Ref Range Status  12/18/2021 19 0 - 40 IU/L Final   AST (SGOT) Piccolo, Waived  Date Value Ref Range Status  03/21/2018 59 (H) 11 - 38 U/L Final         Passed - ALT in normal range and within 360 days    ALT  Date Value Ref Range Status  12/18/2021 11 0 - 32 IU/L Final   ALT (SGPT) Piccolo, Waived  Date Value Ref Range Status  03/21/2018 45 10 - 47 U/L Final         Passed - Valid encounter within last 6 months    Recent Outpatient Visits           1 week ago Generalized abdominal pain   [facility] [name], NP   3 weeks ago Hypertension associated with diabetes ([place])   [facility] [name], NP   2 months ago Hypertension associated with diabetes ([place])   [facility] [name], NP   6 months ago Hypertension associated with diabetes ([place])   [facility] [name], NP   7 months ago BRBPR (bright red blood per rectum)   [place], MD       Future Appointments             In 6 days [name], NP [place]             doxycycline (VIBRA-TABS) 100 MG tablet [Pharmacy Med Name: DOXYCYCLINE HYCLATE 100 MG TAB] 42 tablet  0    Sig: TAKE 1 TABLET BY MOUTH TWICE A DAY     Off-Protocol Failed - 01/12/2022 12:41 PM      Failed - Medication not assigned to a protocol, review manually.      Passed - Valid encounter within last 12 months    Recent Outpatient Visits           1 week ago Generalized abdominal pain   [place], NP   3 weeks ago Hypertension associated with diabetes [facility])   [facility] [name], NP   2 months ago Hypertension associated with diabetes [facility])   [facility] [name], NP   6 months ago Hypertension associated with diabetes [facility])   [facility] [name], NP   7 months ago BRBPR (bright red blood per rectum)   [place] Charlynne Cousins, MD       Future Appointments             In 6 days [name], NP [facility] [place]

## 2022-01-14 ENCOUNTER — Telehealth: Payer: Self-pay | Admitting: Nurse Practitioner

## 2022-01-14 NOTE — Telephone Encounter (Signed)
Can you call and check on patient and see how she is doing?  See if she went to the vascular appt last week.

## 2022-01-14 NOTE — Telephone Encounter (Signed)
Patient reports afer having a bowel movement, she felt better but since Monday of this week her abdominal pain has returned and radiates everywhere. Asked patient if she went to see vasculat, patient states she was not aware of appointment. Advised patient to call Vascular and r/s her appt with them. Pt voiced understanding.

## 2022-01-15 NOTE — Telephone Encounter (Signed)
Please let patient know that I believe the pain is stemming from the constipation.  I want her to take miralax twice daily to help with bowel movements.    Can we please call over to vascular and explain that the patient wasn't aware of the appt and see if she can be rescheduled?

## 2022-01-15 NOTE — Telephone Encounter (Signed)
LVMTRC 

## 2022-01-16 ENCOUNTER — Other Ambulatory Visit: Payer: Self-pay | Admitting: Gastroenterology

## 2022-01-16 ENCOUNTER — Telehealth: Payer: Self-pay

## 2022-01-16 NOTE — Progress Notes (Signed)
    Chronic Care Management Pharmacy Assistant   Name: Anna Juarez  MRN: [id number] DOB: [birth year]  Reason for Encounter: Medication Review    Medications: Outpatient Encounter Medications as of 01/16/2022  Medication Sig   acetaminophen (TYLENOL) 500 MG tablet Take 2 tablets (1,000 mg total) by mouth every 6 (six) hours as needed for mild pain.   Ascorbic Acid (VITAMIN C) 1000 MG tablet Take 1,000 mg by mouth daily.   benazepril (LOTENSIN) 40 MG tablet Take 1 tablet (40 mg total) by mouth daily.   dapagliflozin propanediol (FARXIGA) 10 MG TABS tablet Take 1 tablet (10 mg total) by mouth daily before breakfast.   doxycycline (VIBRA-TABS) 100 MG tablet Take 1 tablet (100 mg total) by mouth 2 (two) times daily.   glucose blood test strip Use as instructed   metoprolol succinate (TOPROL-XL) 50 MG 24 hr tablet TAKE ONE TABLET BY MOUTH ONCE DAILY WITH A MEAL   ondansetron (ZOFRAN) 4 MG tablet Take 1 tablet (4 mg total) by mouth every 6 (six) hours.   pantoprazole (PROTONIX) 40 MG tablet Take 1 tablet (40 mg total) by mouth 2 (two) times daily before a meal.   pregabalin (LYRICA) 150 MG capsule TAKE ONE CAPSULE BY MOUTH THREE TIMES DAILY   sucralfate (CARAFATE) 1 g tablet Take 1 tablet (1 g total) by mouth 4 (four) times daily -  with meals and at bedtime.   traMADol (ULTRAM) 50 MG tablet Take 1 tablet (50 mg total) by mouth every 8 (eight) hours as needed.   traZODone (DESYREL) 100 MG tablet Take 1 tablet (100 mg total) by mouth at bedtime as needed. for sleep   No facility-administered encounter medications on file as of 01/16/2022.    BP Readings from Last 3 Encounters:  01/06/22 130/80  12/18/21 136/82  10/16/21 139/68    Lab Results  Component Value Date   HGBA1C 6.9 (H) 12/18/2021    Reviewed chart for medication changes ahead of medication coordination call.  No OVs, Consults, or hospital visits since last care coordination call/Pharmacist visit.   No medication changes  indicated   Patient obtains medications through Adherence Packaging  90 Days   Last adherence delivery included:  Metoprolol succ er 50 mg Breakfast Pantoprazole 40 mg Breakfast Benazepril 40 mg Breakfast Pregabalin 150 mg Breakfast, Lunch, Evening meal   Patient is due for next adherence delivery on: 01/27/22  Called patient and reviewed medications and coordinated delivery. This delivery to include: Metoprolol succ er 50 mg Breakfast Pantoprazole 40 mg Breakfast Benazepril 40 mg Breakfast Pregabalin 150 mg Breakfast, Lunch, Evening meal Prevident 500 mg toothpaste  Patient declined the following medications (dicyclomine) due to (no longer needed, per pcp)   Confirmed delivery date of 01/27/22, advised patient that pharmacy will contact them the morning of delivery.   [place] [phone]

## 2022-01-19 ENCOUNTER — Ambulatory Visit: Payer: Medicare PPO | Admitting: Nurse Practitioner

## 2022-01-19 ENCOUNTER — Encounter: Payer: Self-pay | Admitting: Nurse Practitioner

## 2022-01-19 VITALS — BP 138/86 | HR 74 | Temp 98.5°F | Wt 281.7 lb

## 2022-01-19 DIAGNOSIS — K5900 Constipation, unspecified: Secondary | ICD-10-CM

## 2022-01-19 DIAGNOSIS — Z23 Encounter for immunization: Secondary | ICD-10-CM | POA: Diagnosis not present

## 2022-01-19 MED ORDER — LINACLOTIDE 72 MCG PO CAPS: 72.0000 ug | ORAL_CAPSULE | Freq: Every day | ORAL | 0 refills | Status: DC

## 2022-01-19 NOTE — Assessment & Plan Note (Addendum)
Chronic. Ongoing. Symptoms improve when she has a bowel movement. Will start Linzess once daily to help with constipation.  Side effects and benefits of medication discussed during visit today. Keep appt with Vascular to rule out mesenteric ischemia. Follow up in 1 month.  Call sooner if concerns arise. Follow up in 1 month.  Call sooner if concerns arise.

## 2022-01-19 NOTE — Progress Notes (Signed)
BP 138/86   Pulse 74   Temp 98.5 F (36.9 C) (Oral)   Wt 281 lb 11.2 oz (127.8 kg)   SpO2 95%   BMI 51.52 kg/m    Subjective:    Patient ID: Anna Juarez, female    DOB: [birth year], 71 y.o.   MRN: [id number]  HPI: Anna Juarez is a 71 y.o. female  Chief Complaint  Patient presents with   Abdominal Pain    Patient has upcoming appt with vascular on 02/03/2022.    Hypertension   01/19/2022- Patient states her pain is better than the last time she was here.  She feels better when she has a bowel movement.  She has been eat prunes, stool softener.  The magnesium citrate did help her have a good bowel movement.  She still has the pain when she tries to eat.  Some days when she has a bowel movement she feels better.  She is able to drink more water than she used to.    01/06/2022- Patient states her pain has moved.  She has pain when she props her hands on her belly, when the seat belt touches her belly.  She is still having nausea.  Any taste or smell makes her sick.  If she lays down or sits without moving then the nausea eases. Eating does not bother her as much as drinking.  Feels like a burning pain.  Feels like she isn't sure if the lyrica helps her symptoms.  Tramadol used to help her pain but no longer helping.   States she will taste things that she normally enjoys eating and they taste terrible.  She may go 5 days without having a bowel movement.  She takes a bite or two of food and then is so full or feels like her stomach is so swollen.    Relevant past medical, surgical, family and social history reviewed and updated as indicated. Interim medical history since our last visit reviewed. Allergies and medications reviewed and updated.  Review of Systems  Eyes:  Negative for visual disturbance.  Respiratory:  Negative for chest tightness and shortness of breath.   Cardiovascular:  Negative for chest pain, palpitations and leg swelling.  Gastrointestinal:  Positive for  abdominal pain, constipation and nausea.  Endocrine: Negative for polydipsia and polyuria.  Musculoskeletal:        Hip pain  Neurological:  Negative for dizziness, light-headedness, numbness and headaches.  Psychiatric/Behavioral:  Negative for dysphoric mood and sleep disturbance.        Tearful    Per HPI unless specifically indicated above     Objective:    BP 138/86   Pulse 74   Temp 98.5 F (36.9 C) (Oral)   Wt 281 lb 11.2 oz (127.8 kg)   SpO2 95%   BMI 51.52 kg/m   Wt Readings from Last 3 Encounters:  01/19/22 281 lb 11.2 oz (127.8 kg)  01/06/22 282 lb 6.4 oz (128.1 kg)  12/18/21 284 lb 11.2 oz (129.1 kg)    Physical Exam Vitals and nursing note reviewed.  Constitutional:      General: She is not in acute distress.    Appearance: Normal appearance. She is obese. She is not ill-appearing, toxic-appearing or diaphoretic.  HENT:     Head: Normocephalic.     Right Ear: External ear normal.     Left Ear: External ear normal.     Nose: Nose normal.     Mouth/Throat:  Mouth: Mucous membranes are moist.     Pharynx: Oropharynx is clear.  Eyes:     General:        Right eye: No discharge.        Left eye: No discharge.     Extraocular Movements: Extraocular movements intact.     Conjunctiva/sclera: Conjunctivae normal.     Pupils: Pupils are equal, round, and reactive to light.  Cardiovascular:     Rate and Rhythm: Normal rate and regular rhythm.     Heart sounds: No murmur heard. Pulmonary:     Effort: Pulmonary effort is normal. No respiratory distress.     Breath sounds: Normal breath sounds. No wheezing or rales.  Abdominal:     Tenderness: There is abdominal tenderness in the right upper quadrant. There is no right CVA tenderness or guarding. Negative signs include Murphy's sign.    Musculoskeletal:     Cervical back: Normal range of motion and neck supple.  Skin:    General: Skin is warm and dry.     Capillary Refill: Capillary refill takes less  than 2 seconds.  Neurological:     General: No focal deficit present.     Mental Status: She is alert and oriented to person, place, and time. Mental status is at baseline.  Psychiatric:        Mood and Affect: Mood normal.        Behavior: Behavior normal.        Thought Content: Thought content normal.        Judgment: Judgment normal.     Comments: tearful     Results for orders placed or performed in visit on 12/18/21  Lipid Profile  Result Value Ref Range   Cholesterol, Total 218 (H) 100 - 199 mg/dL   Triglycerides 166 (H) 0 - 149 mg/dL   HDL 49 >39 mg/dL   VLDL Cholesterol Cal 30 5 - 40 mg/dL   LDL Chol Calc (NIH) 139 (H) 0 - 99 mg/dL   Chol/HDL Ratio 4.4 0.0 - 4.4 ratio  HgB A1c  Result Value Ref Range   Hgb A1c MFr Bld 6.9 (H) 4.8 - 5.6 %   Est. average glucose Bld gHb Est-mCnc 151 mg/dL  Alpha-Gal Panel  Result Value Ref Range   Class Description Allergens Comment    IgE (Immunoglobulin E), Serum 40 6 - 495 IU/mL   [id number] Alpha-Gal 0.13 (A) Class 0/I kU/L   Beef IgE <0.10 Class 0 kU/L   Pork IgE <0.10 Class 0 kU/L   Allergen Lamb IgE <0.10 Class 0 kU/L  Rocky mtn spotted fvr abs pnl(IgG+IgM)  Result Value Ref Range   RMSF IgG Negative Negative   RMSF IgM 1.06 (H) 0.00 - 0.89 index  Comprehensive metabolic panel  Result Value Ref Range   Glucose 134 (H) 70 - 99 mg/dL   BUN 12 8 - 27 mg/dL   Creatinine, Ser 1.68 (H) 0.57 - 1.00 mg/dL   eGFR 32 (L) >59 mL/min/1.73   BUN/Creatinine Ratio 7 (L) 12 - 28   Sodium 145 (H) 134 - 144 mmol/L   Potassium 4.5 3.5 - 5.2 mmol/L   Chloride 102 96 - 106 mmol/L   CO2 24 20 - 29 mmol/L   Calcium 10.2 8.7 - 10.3 mg/dL   Total Protein 7.4 6.0 - 8.5 g/dL   Albumin 4.7 3.8 - 4.8 g/dL   Globulin, Total 2.7 1.5 - 4.5 g/dL   Albumin/Globulin Ratio 1.7 1.2 - 2.2  Bilirubin Total 0.6 0.0 - 1.2 mg/dL   Alkaline Phosphatase 65 44 - 121 IU/L   AST 19 0 - 40 IU/L   ALT 11 0 - 32 IU/L  Lyme Disease Serology w/Reflex  Result  Value Ref Range   Lyme Total Antibody EIA Negative Negative      Assessment & Plan:   Problem List Items Addressed This Visit       Other   Constipation    Chronic. Ongoing. Symptoms improve when she has a bowel movement. Will start Linzess once daily to help with constipation.  Side effects and benefits of medication discussed during visit today. Keep appt with Vascular to rule out mesenteric ischemia. Follow up in 1 month.  Call sooner if concerns arise. Follow up in 1 month.  Call sooner if concerns arise.       Other Visit Diagnoses     Need for influenza vaccination    -  Primary   Relevant Orders   Flu Vaccine QUAD High Dose(Fluad) (Completed)        Follow up plan: Return in about 1 month (around 02/19/2022) for Abdominal Pain.

## 2022-01-22 ENCOUNTER — Other Ambulatory Visit: Payer: Self-pay | Admitting: Nurse Practitioner

## 2022-01-22 DIAGNOSIS — I10 Essential (primary) hypertension: Secondary | ICD-10-CM

## 2022-01-22 NOTE — Telephone Encounter (Signed)
Unable to refill per protocol, Rx request is too soon. Last refill was 12/18/21 for 90 and 1 RF. Will refuse request/  Requested Prescriptions  Pending Prescriptions Disp Refills  . benazepril (LOTENSIN) 40 MG tablet [Pharmacy Med Name: benazepril 40 mg tablet] 90 tablet 1    Sig: TAKE ONE TABLET BY MOUTH ONCE DAILY     Cardiovascular:  ACE Inhibitors Failed - 01/22/2022  2:31 PM      Failed - Cr in normal range and within 180 days    Creatinine, Ser  Date Value Ref Range Status  12/18/2021 1.68 (H) 0.57 - 1.00 mg/dL Final         Passed - K in normal range and within 180 days    Potassium  Date Value Ref Range Status  12/18/2021 4.5 3.5 - 5.2 mmol/L Final         Passed - Patient is not pregnant      Passed - Last BP in normal range    BP Readings from Last 1 Encounters:  01/19/22 138/86         Passed - Valid encounter within last 6 months    Recent Outpatient Visits          3 days ago Need for influenza vaccination   [facility] [name], NP   2 weeks ago Generalized abdominal pain   [facility] [name], NP   1 month ago Hypertension associated with diabetes [facility])   [facility] [name], NP   3 months ago Hypertension associated with diabetes [facility])   [facility] [name], NP   7 months ago Hypertension associated with diabetes [facility])   [place] [name], NP      Future Appointments            In 4 weeks [name], NP [facility] [place]

## 2022-01-26 ENCOUNTER — Other Ambulatory Visit: Payer: Self-pay

## 2022-01-26 DIAGNOSIS — D472 Monoclonal gammopathy: Secondary | ICD-10-CM

## 2022-01-27 ENCOUNTER — Inpatient Hospital Stay: Payer: Medicare PPO | Attending: Internal Medicine

## 2022-01-27 ENCOUNTER — Encounter: Payer: Self-pay | Admitting: Internal Medicine

## 2022-01-27 ENCOUNTER — Inpatient Hospital Stay: Payer: Medicare PPO | Admitting: Internal Medicine

## 2022-01-27 DIAGNOSIS — D472 Monoclonal gammopathy: Secondary | ICD-10-CM | POA: Diagnosis present

## 2022-01-27 DIAGNOSIS — M549 Dorsalgia, unspecified: Secondary | ICD-10-CM | POA: Diagnosis not present

## 2022-01-27 DIAGNOSIS — E1165 Type 2 diabetes mellitus with hyperglycemia: Secondary | ICD-10-CM | POA: Diagnosis not present

## 2022-01-27 DIAGNOSIS — Z79899 Other long term (current) drug therapy: Secondary | ICD-10-CM | POA: Insufficient documentation

## 2022-01-27 DIAGNOSIS — R109 Unspecified abdominal pain: Secondary | ICD-10-CM | POA: Diagnosis not present

## 2022-01-27 DIAGNOSIS — N1832 Chronic kidney disease, stage 3b: Secondary | ICD-10-CM | POA: Diagnosis not present

## 2022-01-27 DIAGNOSIS — R5382 Chronic fatigue, unspecified: Secondary | ICD-10-CM | POA: Diagnosis not present

## 2022-01-27 DIAGNOSIS — G8929 Other chronic pain: Secondary | ICD-10-CM | POA: Insufficient documentation

## 2022-01-27 LAB — COMPREHENSIVE METABOLIC PANEL
ALT: 13 U/L (ref 0–44)
AST: 23 U/L (ref 15–41)
Albumin: 4.1 g/dL (ref 3.5–5.0)
Alkaline Phosphatase: 54 U/L (ref 38–126)
Anion gap: 7 (ref 5–15)
BUN: 14 mg/dL (ref 8–23)
CO2: 24 mmol/L (ref 22–32)
Calcium: 8.9 mg/dL (ref 8.9–10.3)
Chloride: 107 mmol/L (ref 98–111)
Creatinine, Ser: 1.21 mg/dL — ABNORMAL HIGH (ref 0.44–1.00)
GFR, Estimated: 47 mL/min — ABNORMAL LOW (ref >60)
Glucose, Bld: 138 mg/dL — ABNORMAL HIGH (ref 70–99)
Potassium: 3.9 mmol/L (ref 3.5–5.1)
Sodium: 138 mmol/L (ref 135–145)
Total Bilirubin: 0.7 mg/dL (ref 0.3–1.2)
Total Protein: 7.6 g/dL (ref 6.5–8.1)

## 2022-01-27 LAB — CBC WITH DIFFERENTIAL/PLATELET
Abs Immature Granulocytes: 0.03 10*3/uL (ref 0.00–0.07)
Basophils Absolute: 0 10*3/uL (ref 0.0–0.1)
Basophils Relative: 1 %
Eosinophils Absolute: 0.2 10*3/uL (ref 0.0–0.5)
Eosinophils Relative: 3 %
HCT: 46.7 % — ABNORMAL HIGH (ref 36.0–46.0)
Hemoglobin: 15.5 g/dL — ABNORMAL HIGH (ref 12.0–15.0)
Immature Granulocytes: 1 %
Lymphocytes Relative: 33 %
Lymphs Abs: 2 10*3/uL (ref 0.7–4.0)
MCH: 30.7 pg (ref 26.0–34.0)
MCHC: 33.2 g/dL (ref 30.0–36.0)
MCV: 92.5 fL (ref 80.0–100.0)
Monocytes Absolute: 0.6 10*3/uL (ref 0.1–1.0)
Monocytes Relative: 10 %
Neutro Abs: 3.4 10*3/uL (ref 1.7–7.7)
Neutrophils Relative %: 52 %
Platelets: 164 10*3/uL (ref 150–400)
RBC: 5.05 MIL/uL (ref 3.87–5.11)
RDW: 13.2 % (ref 11.5–15.5)
WBC: 6.2 10*3/uL (ref 4.0–10.5)
nRBC: 0 % (ref 0.0–0.2)

## 2022-01-27 NOTE — Progress Notes (Signed)
Patient denies new problems/concerns today.   °

## 2022-01-27 NOTE — Progress Notes (Signed)
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  Ambulating independently.  Anna Juarez 71 y.o.  female history of monoclonal gammopathy of unknown significance is here for follow-up.  Patient continues to have chronic back pain joint pains. Denies any tingling or numbness.  Denies any weight loss.  Patient also has chronic abdominal pain not any worse.  Chronic fatigue.  Review of Systems  Constitutional:  Positive for malaise/fatigue. Negative for chills, diaphoresis, fever and weight loss.  HENT:  Negative for nosebleeds and sore throat.   Eyes:  Negative for double vision.  Respiratory:  Negative for cough, hemoptysis, sputum production, shortness of breath and wheezing.   Cardiovascular:  Negative for chest pain, palpitations, orthopnea and leg swelling.  Gastrointestinal:  Negative for blood in stool, constipation, diarrhea, heartburn, melena, nausea and vomiting.  Genitourinary:  Negative for dysuria, frequency and urgency.  Musculoskeletal:  Positive for back pain and joint pain.  Skin: Negative.  Negative for itching and rash.  Neurological:  Negative for dizziness, tingling, focal weakness, weakness and headaches.  Endo/Heme/Allergies:  Does not bruise/bleed easily.  Psychiatric/Behavioral:  Negative for depression. The patient is not nervous/anxious and does not have insomnia.      MEDICAL HISTORY:  Past Medical History:  Diagnosis Date  . Arthritis   . Chronic kidney disease   . Complication of anesthesia   . Diabetes mellitus without complication ([place])   . Extreme  obesity   . Fatty liver   . GERD (gastroesophageal reflux disease)   . Hyperlipidemia   . Hypertension   . Palpitations   . PONV (postoperative nausea and vomiting)   . Radiculopathy, cervical   . Renal insufficiency    stage 3  . Sleep apnea    uses cpap  . Tear of acetabular labrum 01/2021   right    SURGICAL HISTORY: Past Surgical History:  Procedure Laterality Date  . ABDOMINAL HYSTERECTOMY    . CATARACT EXTRACTION W/PHACO Right 06/02/2021   Procedure: CATARACT EXTRACTION PHACO AND INTRAOCULAR LENS PLACEMENT ([place]) RIGHT DIABETIC;  Surgeon: Eulogio Bear, MD;  Location: [place];  Service: Ophthalmology;  Laterality: Right;  2.52 0:22.8  . CATARACT EXTRACTION W/PHACO Left 06/16/2021   Procedure: CATARACT EXTRACTION PHACO AND INTRAOCULAR LENS PLACEMENT ([facility]) LEFT DIABETIC 3.51 00:30.9;  Surgeon: Eulogio Bear, MD;  Location: [place];  Service: Ophthalmology;  Laterality: Left;  . CERVICAL FUSION     x2  . CHOLECYSTECTOMY    . COLONOSCOPY    . COLONOSCOPY WITH PROPOFOL N/A 09/09/2021   Procedure: COLONOSCOPY WITH PROPOFOL;  Surgeon: Lucilla Lame, MD;  Location: [facility] ENDOSCOPY;  Service: Endoscopy;  Laterality: N/A;  . ESOPHAGOGASTRODUODENOSCOPY (EGD) WITH PROPOFOL N/A 09/09/2021   Procedure: ESOPHAGOGASTRODUODENOSCOPY (EGD) WITH PROPOFOL;  Surgeon: Lucilla Lame, MD;  Location: [facility] ENDOSCOPY;  Service: Endoscopy;  Laterality: N/A;  . JOINT REPLACEMENT Bilateral    knees    SOCIAL HISTORY: Social History   Socioeconomic History  . Marital status: Married    Spouse name: Not on file  . Number of children: Not on file  . Years of education: Not on file  . Highest education level: 12th grade  Occupational History  . Occupation: retired   Tobacco Use  . Smoking status: Never    Passive exposure: Never  . Smokeless tobacco: Never  Vaping Use  . Vaping Use: Never used  Substance and Sexual Activity  . Alcohol use: No  . Drug use: No  .  Sexual activity: Not Currently  Other Topics Concern  . Not on file  Social History Narrative   [place]; lives with husband; never smoker; no alcohol; retd. Manufacturing.    Social Determinants of Health   Financial Resource Strain: Low Risk  (09/29/2021)   Overall Financial Resource Strain (CARDIA)   . Difficulty of Paying Living Expenses: Not hard at all  Food Insecurity: No Food Insecurity (09/29/2021)   Hunger Vital Sign   . Worried About Charity fundraiser in the Last Year: Never true   . Ran Out of Food in the Last Year: Never true  Transportation Needs: No Transportation Needs (09/29/2021)   PRAPARE - Transportation   . Lack of Transportation (Medical): No   . Lack of Transportation (Non-Medical): No  Physical Activity: Insufficiently Active (09/29/2021)   Exercise Vital Sign   . Days of Exercise per Week: 2 days   . Minutes of Exercise per Session: 10 min  Stress: No Stress Concern Present (09/29/2021)   [place]   . Feeling of Stress : Only a little  Social Connections: Moderately Isolated (09/29/2021)   Social Connection and Isolation Panel [NHANES]   . Frequency of Communication with Friends and Family: More than three times a week   . Frequency of Social Gatherings with Friends and Family: Once a week   . Attends Religious Services: More than 4 times per year   . Active Member of Clubs or Organizations: No   . Attends Archivist Meetings: Never   . Marital Status: Widowed  Intimate Partner Violence: Not At Risk (09/29/2021)   Humiliation, Afraid, Rape, and Kick questionnaire   . Fear of Current or Ex-Partner: No   . Emotionally Abused: No   . Physically Abused: No   . Sexually Abused: No    FAMILY HISTORY: Family History  Problem Relation Age of Onset  . Diabetes Mother   . Heart disease Mother   . Hypertension Mother   . Heart disease Father   . Cancer Brother   .  Hypertension Brother   . Breast cancer Maternal Aunt   . Breast cancer Paternal Aunt     ALLERGIES:  is allergic to actos [pioglitazone], metformin and related, scopolamine, and victoza [liraglutide].  MEDICATIONS:  Current Outpatient Medications  Medication Sig Dispense Refill  . acetaminophen (TYLENOL) 500 MG tablet Take 2 tablets (1,000 mg total) by mouth every 6 (six) hours as needed for mild pain.    . benazepril (LOTENSIN) 40 MG tablet Take 1 tablet (40 mg total) by mouth daily. 90 tablet 1  . dapagliflozin propanediol (FARXIGA) 10 MG TABS tablet Take 1 tablet (10 mg total) by mouth daily before breakfast. 90 tablet 1  . glucose blood test strip Use as instructed 100 each 12  . linaclotide (LINZESS) 72 MCG capsule Take 1 capsule (72 mcg total) by mouth daily before breakfast. 30 capsule 0  . metoprolol succinate (TOPROL-XL) 50 MG 24 hr tablet TAKE ONE TABLET BY MOUTH ONCE DAILY WITH A MEAL 90 tablet 1  . ondansetron (ZOFRAN) 4 MG tablet Take 1 tablet (4 mg total) by mouth every 6 (  six) hours. 30 tablet 0  . pantoprazole (PROTONIX) 40 MG tablet Take 1 tablet (40 mg total) by mouth 2 (two) times daily before a meal. 180 tablet 1  . pregabalin (LYRICA) 150 MG capsule TAKE ONE CAPSULE BY MOUTH THREE TIMES DAILY 270 capsule 1  . traMADol (ULTRAM) 50 MG tablet Take 1 tablet (50 mg total) by mouth every 8 (eight) hours as needed. 60 tablet 1  . traZODone (DESYREL) 100 MG tablet Take 1 tablet (100 mg total) by mouth at bedtime as needed. for sleep 90 tablet 1  . Ascorbic Acid (VITAMIN C) 1000 MG tablet Take 1,000 mg by mouth daily. (Patient not taking: Reported on 01/27/2022)    . dicyclomine (BENTYL) 20 MG tablet TAKE ONE TABLET BY MOUTH three times daily BEFORE meals (Patient not taking: Reported on 01/27/2022) 90 tablet 2  . doxycycline (VIBRA-TABS) 100 MG tablet Take 1 tablet (100 mg total) by mouth 2 (two) times daily. (Patient not taking: Reported on 01/27/2022) 42 tablet 0   No current  facility-administered medications for this visit.      Marland Kitchen  PHYSICAL EXAMINATION:  Vitals:   01/27/22 1500  BP: (!) 144/88  Pulse: 80  Resp: 16  Temp: (!) 96.6 F (35.9 C)   Filed Weights   01/27/22 1500  Weight: 282 lb 1.6 oz (128 kg)    Physical Exam Vitals and nursing note reviewed.  HENT:     Head: Normocephalic and atraumatic.     Mouth/Throat:     Pharynx: Oropharynx is clear.  Eyes:     Extraocular Movements: Extraocular movements intact.     Pupils: Pupils are equal, round, and reactive to light.  Cardiovascular:     Rate and Rhythm: Normal rate and regular rhythm.  Pulmonary:     Comments: Decreased breath sounds bilaterally.  Abdominal:     Palpations: Abdomen is soft.  Musculoskeletal:        General: Normal range of motion.     Cervical back: Normal range of motion.  Skin:    General: Skin is warm.  Neurological:     General: No focal deficit present.     Mental Status: She is alert and oriented to person, place, and time.  Psychiatric:        Behavior: Behavior normal.        Judgment: Judgment normal.     LABORATORY DATA:  I have reviewed the data as listed Lab Results  Component Value Date   WBC 6.2 01/27/2022   HGB 15.5 (H) 01/27/2022   HCT 46.7 (H) 01/27/2022   MCV 92.5 01/27/2022   PLT 164 01/27/2022   Recent Labs    09/16/21 1119 10/16/21 1122 12/18/21 1141 01/27/22 1454  NA 140 140 145* 138  K 3.9 4.2 4.5 3.9  CL 101 99 102 107  CO2 '28 24 24 24  ' GLUCOSE 182* 123* 134* 138*  BUN '12 16 12 14  ' CREATININE 1.31* 1.49* 1.68* 1.21*  CALCIUM 9.1 9.6 10.2 8.9  GFRNONAA 43*  --   --  47*  PROT  --  6.8 7.4 7.6  ALBUMIN  --  4.4 4.7 4.1  AST  --  '22 19 23  ' ALT  --  '10 11 13  ' ALKPHOS  --  63 65 54  BILITOT  --  0.5 0.6 0.7    RADIOGRAPHIC STUDIES: I have personally reviewed the radiological images as listed and agreed with the findings in the report. DG Abd 2 Views  Result Date: 01/07/2022 CLINICAL DATA:  Abdominal pain  EXAM: ABDOMEN - 2 VIEW COMPARISON:  Abdominal radiograph dated December 21, 2013 FINDINGS: The bowel gas pattern is normal. Large stool burden. There is no evidence of free air. Pelvic calcifications which are likely phleboliths. Mild dextrocurvature and moderate degenerative changes of the lumbar spine. IMPRESSION: Nonobstructive bowel gas pattern. Large stool burden. Electronically Signed   By: Yetta Glassman M.D.   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  I reviewed the natural history of MGUS; low risk of transformation to multiple myeloma. Labs pending today.   # Stage 3b chronic kidney disease- GFR 47-STABLE.   # Poorly controlled DM- HbA1c- 7.4-recommend close monitoring of blood sugars/compliance with medications.  # Hip pain- awaiting hip MRI w/o contrast [PCP]-10/26-MRI hip shows no evidence or concerns of myelomatous involvement; more consistent with osteoarthritis.   mychart-  # DISPOSITION: # follow up in 12 months- MD; labs- cbc/cmp;MM panel; K/l light chains-Dr.B   All questions were answered. The patient knows to call the clinic with any problems, questions or concerns.    Cammie Sickle, MD 01/27/2022 4:06 PM

## 2022-01-27 NOTE — Assessment & Plan Note (Addendum)
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  I reviewed the natural history of MGUS; low risk of transformation to multiple myeloma. Labs pending today.   # Stage 3b chronic kidney disease- GFR 47-STABLE.   # Poorly controlled DM- HbA1c- 7.4-recommend close monitoring of blood sugars/compliance with medications.  # Hip pain- awaiting hip MRI w/o contrast [PCP]-10/26-MRI hip shows no evidence or concerns of myelomatous involvement; more consistent with osteoarthritis.  mychart-  # DISPOSITION: # follow up in 12 months- MD; labs- cbc/cmp;MM panel; K/l light chains-Dr.B

## 2022-01-28 LAB — KAPPA/LAMBDA LIGHT CHAINS
Kappa free light chain: 22.8 mg/L — ABNORMAL HIGH (ref 3.3–19.4)
Kappa, lambda light chain ratio: 0.21 — ABNORMAL LOW (ref 0.26–1.65)
Lambda free light chains: 109.3 mg/L — ABNORMAL HIGH (ref 5.7–26.3)

## 2022-01-30 ENCOUNTER — Other Ambulatory Visit (INDEPENDENT_AMBULATORY_CARE_PROVIDER_SITE_OTHER): Payer: Self-pay | Admitting: Nurse Practitioner

## 2022-01-30 DIAGNOSIS — R1084 Generalized abdominal pain: Secondary | ICD-10-CM

## 2022-02-02 ENCOUNTER — Other Ambulatory Visit: Payer: Self-pay | Admitting: Nurse Practitioner

## 2022-02-02 ENCOUNTER — Encounter (INDEPENDENT_AMBULATORY_CARE_PROVIDER_SITE_OTHER): Payer: Self-pay

## 2022-02-02 LAB — MULTIPLE MYELOMA PANEL, SERUM
Albumin SerPl Elph-Mcnc: 3.7 g/dL (ref 2.9–4.4)
Albumin/Glob SerPl: 1.2 (ref 0.7–1.7)
Alpha 1: 0.2 g/dL (ref 0.0–0.4)
Alpha2 Glob SerPl Elph-Mcnc: 0.9 g/dL (ref 0.4–1.0)
B-Globulin SerPl Elph-Mcnc: 1.4 g/dL — ABNORMAL HIGH (ref 0.7–1.3)
Gamma Glob SerPl Elph-Mcnc: 0.7 g/dL (ref 0.4–1.8)
Globulin, Total: 3.1 g/dL (ref 2.2–3.9)
IgA: 455 mg/dL — ABNORMAL HIGH (ref 64–422)
IgG (Immunoglobin G), Serum: 845 mg/dL (ref 586–1602)
IgM (Immunoglobulin M), Srm: 52 mg/dL (ref 26–217)
M Protein SerPl Elph-Mcnc: 0.5 g/dL — ABNORMAL HIGH
Total Protein ELP: 6.8 g/dL (ref 6.0–8.5)

## 2022-02-03 ENCOUNTER — Ambulatory Visit (INDEPENDENT_AMBULATORY_CARE_PROVIDER_SITE_OTHER): Payer: Medicare PPO | Admitting: Nurse Practitioner

## 2022-02-03 ENCOUNTER — Encounter (INDEPENDENT_AMBULATORY_CARE_PROVIDER_SITE_OTHER): Payer: Self-pay | Admitting: Nurse Practitioner

## 2022-02-03 ENCOUNTER — Ambulatory Visit (INDEPENDENT_AMBULATORY_CARE_PROVIDER_SITE_OTHER): Payer: Medicare PPO

## 2022-02-03 VITALS — BP 116/70 | HR 84 | Resp 16 | Wt 282.8 lb

## 2022-02-03 DIAGNOSIS — R1084 Generalized abdominal pain: Secondary | ICD-10-CM | POA: Diagnosis not present

## 2022-02-03 DIAGNOSIS — K551 Chronic vascular disorders of intestine: Secondary | ICD-10-CM

## 2022-02-03 DIAGNOSIS — E1142 Type 2 diabetes mellitus with diabetic polyneuropathy: Secondary | ICD-10-CM

## 2022-02-03 NOTE — Telephone Encounter (Signed)
Requested Prescriptions  Pending Prescriptions Disp Refills  . sucralfate (CARAFATE) 1 g tablet [Pharmacy Med Name: SUCRALFATE 1 GM TABLET] 90 tablet 1    Sig: TAKE 1 TABLET (1 G TOTAL) BY MOUTH 4 TIMES A DAY WITH MEALS AND AT BEDTIME     Gastroenterology: Antiacids Passed - 02/02/2022  2:23 AM      Passed - Valid encounter within last 12 months    Recent Outpatient Visits          2 weeks ago Need for influenza vaccination   [facility] [name], NP   4 weeks ago Generalized abdominal pain   [facility] [name], NP   1 month ago Hypertension associated with diabetes [facility])   [facility] [name], NP   3 months ago Hypertension associated with diabetes [facility])   [facility] [name], NP   7 months ago Hypertension associated with diabetes [facility])   [place] [name], NP      Future Appointments            In 2 weeks [name], NP [facility] [place]

## 2022-02-05 ENCOUNTER — Other Ambulatory Visit: Payer: Self-pay | Admitting: Nurse Practitioner

## 2022-02-05 DIAGNOSIS — E119 Type 2 diabetes mellitus without complications: Secondary | ICD-10-CM

## 2022-02-05 NOTE — Telephone Encounter (Signed)
Requested Prescriptions  Pending Prescriptions Disp Refills   ACCU-CHEK GUIDE test strip [Pharmacy Med Name: ACCU-CHEK GUIDE TEST STRIP] 100 strip 0    Sig: USE AS INSTRUCTED     Endocrinology: Diabetes - Testing Supplies Passed - 02/05/2022  8:48 AM      Passed - Valid encounter within last 12 months    Recent Outpatient Visits           2 weeks ago Need for influenza vaccination   [facility] [name], NP   1 month ago Generalized abdominal pain   [facility] [name], NP   1 month ago Hypertension associated with diabetes [facility])   [facility] [name], NP   3 months ago Hypertension associated with diabetes [facility])   [facility] [name], NP   7 months ago Hypertension associated with diabetes [facility])   [place] [name], NP       Future Appointments             In 2 weeks [name], NP [facility] [place]

## 2022-02-09 NOTE — Progress Notes (Signed)
Subjective:    Patient ID: Anna Juarez, female    DOB: [birth year], 71 y.o.   MRN: [id number] Chief Complaint  Patient presents with   New Patient (Initial Visit)    Ref [facility] consult mesenteric ischemia    I had evidence is referred by [name], NP in regards to evaluation for possible mesenteric ischemia.  The patient notes that she has pain that begins sometimes in her abdomen but at times it can be in her back sometimes in her shoulder blades.  She notes that she is not constipated but does have difficulty with passing stool.  She notes that she often needs to strain in order to pass stools.  She does have nausea but it is not always associated with food and eating.  She notes that she also has pain when eating but it is worse after the supper meal of the day.  However the pain is not always consistent.  Today noninvasive studies show no evidence of stenosis within the celiac, superior mesenteric arteries.  No evidence of stenosis noted in the splenic or hepatic artery.  There is some very mild atherosclerosis noted.  The largest aortic diameter is 2.5 cm    Review of Systems  Gastrointestinal:  Positive for abdominal pain, constipation and nausea.  All other systems reviewed and are negative.      Objective:   Physical Exam Vitals reviewed.  HENT:     Head: Normocephalic.  Cardiovascular:     Rate and Rhythm: Normal rate.     Pulses: Normal pulses.  Pulmonary:     Effort: Pulmonary effort is normal.  Skin:    General: Skin is warm and dry.  Neurological:     Mental Status: She is alert and oriented to person, place, and time.  Psychiatric:        Mood and Affect: Mood normal.        Behavior: Behavior normal.        Thought Content: Thought content normal.        Judgment: Judgment normal.     BP 116/70 (BP Location: Right Arm)   Pulse 84   Resp 16   Wt 282 lb 12.8 oz (128.3 kg)   BMI 51.72 kg/m   Past Medical History:  Diagnosis Date    Arthritis    Chronic kidney disease    Complication of anesthesia    Diabetes mellitus without complication (HCC)    Extreme obesity    Fatty liver    GERD (gastroesophageal reflux disease)    Hyperlipidemia    Hypertension    Palpitations    PONV (postoperative nausea and vomiting)    Radiculopathy, cervical    Renal insufficiency    stage 3   Sleep apnea    uses cpap   Tear of acetabular labrum 01/2021   right    Social History   Socioeconomic History   Marital status: Married    Spouse name: Not on file   Number of children: Not on file   Years of education: Not on file   Highest education level: 12th grade  Occupational History   Occupation: retired   Tobacco Use   Smoking status: Never    Passive exposure: Never   Smokeless tobacco: Never  Vaping Use   Vaping Use: Never used  Substance and Sexual Activity   Alcohol use: No   Drug use: No   Sexual activity: Not Currently  Other Topics Concern   Not on file  Social History Narrative   [place]; lives with husband; never smoker; no alcohol; retd. Manufacturing.    Social Determinants of Health   Financial Resource Strain: Low Risk  (09/29/2021)   Overall Financial Resource Strain (CARDIA)    Difficulty of Paying Living Expenses: Not hard at all  Food Insecurity: No Food Insecurity (09/29/2021)   Hunger Vital Sign    Worried About Running Out of Food in the Last Year: Never true    Ran Out of Food in the Last Year: Never true  Transportation Needs: No Transportation Needs (09/29/2021)   PRAPARE - Hydrologist (Medical): No    Lack of Transportation (Non-Medical): No  Physical Activity: Insufficiently Active (09/29/2021)   Exercise Vital Sign    Days of Exercise per Week: 2 days    Minutes of Exercise per Session: 10 min  Stress: No Stress Concern Present (09/29/2021)   [place]    Feeling of Stress  : Only a little  Social Connections: Moderately Isolated (09/29/2021)   Social Connection and Isolation Panel [NHANES]    Frequency of Communication with Friends and Family: More than three times a week    Frequency of Social Gatherings with Friends and Family: Once a week    Attends Religious Services: More than 4 times per year    Active Member of [employer] or Organizations: No    Attends Archivist Meetings: Never    Marital Status: Widowed  Intimate Partner Violence: Not At Risk (09/29/2021)   Humiliation, Afraid, Rape, and Kick questionnaire    Fear of Current or Ex-Partner: No    Emotionally Abused: No    Physically Abused: No    Sexually Abused: No    Past Surgical History:  Procedure Laterality Date   ABDOMINAL HYSTERECTOMY     CATARACT EXTRACTION W/PHACO Right 06/02/2021   Procedure: CATARACT EXTRACTION PHACO AND INTRAOCULAR LENS PLACEMENT (IOC) RIGHT DIABETIC;  Surgeon: Eulogio Bear, MD;  Location: [place];  Service: Ophthalmology;  Laterality: Right;  2.52 0:22.8   CATARACT EXTRACTION W/PHACO Left 06/16/2021   Procedure: CATARACT EXTRACTION PHACO AND INTRAOCULAR LENS PLACEMENT (IOC) LEFT DIABETIC 3.51 00:30.9;  Surgeon: Eulogio Bear, MD;  Location: [place];  Service: Ophthalmology;  Laterality: Left;   CERVICAL FUSION     x2   CHOLECYSTECTOMY     COLONOSCOPY     COLONOSCOPY WITH PROPOFOL N/A 09/09/2021   Procedure: COLONOSCOPY WITH PROPOFOL;  Surgeon: Lucilla Lame, MD;  Location: [facility] ENDOSCOPY;  Service: Endoscopy;  Laterality: N/A;   ESOPHAGOGASTRODUODENOSCOPY (EGD) WITH PROPOFOL N/A 09/09/2021   Procedure: ESOPHAGOGASTRODUODENOSCOPY (EGD) WITH PROPOFOL;  Surgeon: Lucilla Lame, MD;  Location: [facility] ENDOSCOPY;  Service: Endoscopy;  Laterality: N/A;   JOINT REPLACEMENT Bilateral    knees    Family History  Problem Relation Age of Onset   Diabetes Mother    Heart disease Mother    Hypertension Mother    Heart disease Father     Cancer Brother    Hypertension Brother    Breast cancer Maternal Aunt    Breast cancer Paternal Aunt     Allergies  Allergen Reactions   Actos [Pioglitazone] Nausea And Vomiting   Metformin And Related Diarrhea   Scopolamine Nausea And Vomiting   Victoza [Liraglutide] Nausea And Vomiting       Latest Ref Rng & Units 01/27/2022    2:54 PM 06/10/2021    2:28 PM  01/24/2021    2:14 PM  CBC  WBC 4.0 - 10.5 K/uL 6.2  6.9  6.5   Hemoglobin 12.0 - 15.0 g/dL 15.5  13.9  13.8   Hematocrit 36.0 - 46.0 % 46.7  42.0  40.9   Platelets 150 - 400 K/uL 164  169  156       CMP     Component Value Date/Time   NA 138 01/27/2022 1454   NA 145 (H) 12/18/2021 1141   K 3.9 01/27/2022 1454   CL 107 01/27/2022 1454   CO2 24 01/27/2022 1454   GLUCOSE 138 (H) 01/27/2022 1454   BUN 14 01/27/2022 1454   BUN 12 12/18/2021 1141   CREATININE 1.21 (H) 01/27/2022 1454   CALCIUM 8.9 01/27/2022 1454   PROT 7.6 01/27/2022 1454   PROT 7.4 12/18/2021 1141   ALBUMIN 4.1 01/27/2022 1454   ALBUMIN 4.7 12/18/2021 1141   AST 23 01/27/2022 1454   AST 59 (H) 03/21/2018 1332   ALT 13 01/27/2022 1454   ALT 45 03/21/2018 1332   ALKPHOS 54 01/27/2022 1454   BILITOT 0.7 01/27/2022 1454   BILITOT 0.6 12/18/2021 1141   GFRNONAA 47 (L) 01/27/2022 1454   GFRAA 55 (L) 05/17/2020 1337     No results found.     Assessment & Plan:   1. Chronic mesenteric ischemia (HCC) Based on noninvasive studies today, the patient does not have chronic mesenteric ischemia.  Her mesenteric vessels have no noted significant stenosis.  Patient will follow-up with [place] on an as-needed basis.  2. Generalized abdominal pain Patient should follow-up with PCP and gastroenterology for further work-up and evaluation.  Based on patient's symptoms and description I suspect this is largely related to her recent gallbladder surgery.  3. DM type 2 with diabetic peripheral neuropathy ([place]) Continue hypoglycemic medications as already  ordered, these medications have been reviewed and there are no changes at this time.  Hgb A1C to be monitored as already arranged by primary service   Current Outpatient Medications on File Prior to Visit  Medication Sig Dispense Refill   acetaminophen (TYLENOL) 500 MG tablet Take 2 tablets (1,000 mg total) by mouth every 6 (six) hours as needed for mild pain.     benazepril (LOTENSIN) 40 MG tablet Take 1 tablet (40 mg total) by mouth daily. 90 tablet 1   dapagliflozin propanediol (FARXIGA) 10 MG TABS tablet Take 1 tablet (10 mg total) by mouth daily before breakfast. 90 tablet 1   linaclotide (LINZESS) 72 MCG capsule Take 1 capsule (72 mcg total) by mouth daily before breakfast. 30 capsule 0   metoprolol succinate (TOPROL-XL) 50 MG 24 hr tablet TAKE ONE TABLET BY MOUTH ONCE DAILY WITH A MEAL 90 tablet 1   ondansetron (ZOFRAN) 4 MG tablet Take 1 tablet (4 mg total) by mouth every 6 (six) hours. 30 tablet 0   pantoprazole (PROTONIX) 40 MG tablet Take 1 tablet (40 mg total) by mouth 2 (two) times daily before a meal. 180 tablet 1   pregabalin (LYRICA) 150 MG capsule TAKE ONE CAPSULE BY MOUTH THREE TIMES DAILY 270 capsule 1   traMADol (ULTRAM) 50 MG tablet Take 1 tablet (50 mg total) by mouth every 8 (eight) hours as needed. 60 tablet 1   traZODone (DESYREL) 100 MG tablet Take 1 tablet (100 mg total) by mouth at bedtime as needed. for sleep 90 tablet 1   Ascorbic Acid (VITAMIN C) 1000 MG tablet Take 1,000 mg by mouth daily. (Patient not  taking: Reported on 01/27/2022)     dicyclomine (BENTYL) 20 MG tablet TAKE ONE TABLET BY MOUTH three times daily BEFORE meals (Patient not taking: Reported on 01/27/2022) 90 tablet 2   doxycycline (VIBRA-TABS) 100 MG tablet Take 1 tablet (100 mg total) by mouth 2 (two) times daily. (Patient not taking: Reported on 01/27/2022) 42 tablet 0   No current facility-administered medications on file prior to visit.    There are no Patient Instructions on file for this  visit. No follow-ups on file.   Kris Hartmann, NP

## 2022-02-10 ENCOUNTER — Encounter (INDEPENDENT_AMBULATORY_CARE_PROVIDER_SITE_OTHER): Payer: Self-pay | Admitting: Nurse Practitioner

## 2022-02-19 ENCOUNTER — Ambulatory Visit: Payer: Medicare PPO | Admitting: Nurse Practitioner

## 2022-02-19 ENCOUNTER — Encounter: Payer: Self-pay | Admitting: Nurse Practitioner

## 2022-02-19 VITALS — BP 131/71 | HR 77 | Temp 97.7°F | Wt 285.3 lb

## 2022-02-19 DIAGNOSIS — I10 Essential (primary) hypertension: Secondary | ICD-10-CM | POA: Diagnosis not present

## 2022-02-19 DIAGNOSIS — N1832 Chronic kidney disease, stage 3b: Secondary | ICD-10-CM | POA: Diagnosis not present

## 2022-02-19 DIAGNOSIS — E1142 Type 2 diabetes mellitus with diabetic polyneuropathy: Secondary | ICD-10-CM

## 2022-02-19 DIAGNOSIS — G4730 Sleep apnea, unspecified: Secondary | ICD-10-CM

## 2022-02-19 DIAGNOSIS — Z6841 Body Mass Index (BMI) 40.0 and over, adult: Secondary | ICD-10-CM

## 2022-02-19 DIAGNOSIS — M545 Low back pain, unspecified: Secondary | ICD-10-CM | POA: Diagnosis not present

## 2022-02-19 DIAGNOSIS — K5900 Constipation, unspecified: Secondary | ICD-10-CM

## 2022-02-19 DIAGNOSIS — G8929 Other chronic pain: Secondary | ICD-10-CM

## 2022-02-19 MED ORDER — LINACLOTIDE 72 MCG PO CAPS: 72.0000 ug | ORAL_CAPSULE | Freq: Every day | ORAL | 1 refills | Status: DC

## 2022-02-19 MED ORDER — PANTOPRAZOLE SODIUM 40 MG PO TBEC: 40.0000 mg | DELAYED_RELEASE_TABLET | Freq: Two times a day (BID) | ORAL | 1 refills | Status: DC

## 2022-02-19 MED ORDER — PREGABALIN 150 MG PO CAPS: 150.0000 mg | ORAL_CAPSULE | Freq: Three times a day (TID) | ORAL | 1 refills | Status: DC

## 2022-02-19 MED ORDER — METOPROLOL SUCCINATE ER 50 MG PO TB24: 50.0000 mg | ORAL_TABLET | Freq: Every day | ORAL | 1 refills | Status: DC

## 2022-02-19 MED ORDER — BENAZEPRIL HCL 40 MG PO TABS: 40.0000 mg | ORAL_TABLET | Freq: Every day | ORAL | 1 refills | Status: DC

## 2022-02-19 MED ORDER — TRAMADOL HCL 50 MG PO TABS: 50.0000 mg | ORAL_TABLET | Freq: Three times a day (TID) | ORAL | 1 refills | Status: DC | PRN

## 2022-02-19 MED ORDER — TRAZODONE HCL 100 MG PO TABS: 100.0000 mg | ORAL_TABLET | Freq: Every evening | ORAL | 1 refills | Status: DC | PRN

## 2022-02-19 NOTE — Progress Notes (Signed)
BP 131/71   Pulse 77   Temp 97.7 F (36.5 C) (Oral)   Wt 285 lb 4.8 oz (129.4 kg)   SpO2 97%   BMI 52.18 kg/m    Subjective:    Patient ID: Anna Juarez, female    DOB: 1950-05-01, 71 y.o.   MRN: [id number]  HPI: Anna Juarez is a 71 y.o. female  Chief Complaint  Patient presents with   Abdominal Pain    States medication that was rx to help with bowel movements has helped with abdominal pain    02/19/2022- Patient states she is here to follow up on abdominal pain.  Patient was seen by vascular and determined not to have mesenteric ischemia.  She as started on Linzess at last visit. Patient reports the Linzess has been helping her have a bowel movement.  States she is taking it everyday that she is home she is taking the medication.  Patient has gained 3lbs since last visit.   Patient states she feels like her settings on her sleep apnea   01/19/2022- Patient states her pain is better than the last time she was here.  She feels better when she has a bowel movement.  She has been eat prunes, stool softener.  The magnesium citrate did help her have a good bowel movement.  She still has the pain when she tries to eat.  Some days when she has a bowel movement she feels better.  She is able to drink more water than she used to.    01/06/2022- Patient states her pain has moved.  She has pain when she props her hands on her belly, when the seat belt touches her belly.  She is still having nausea.  Any taste or smell makes her sick.  If she lays down or sits without moving then the nausea eases. Eating does not bother her as much as drinking.  Feels like a burning pain.  Feels like she isn't sure if the lyrica helps her symptoms.  Tramadol used to help her pain but no longer helping.   States she will taste things that she normally enjoys eating and they taste terrible.  She may go 5 days without having a bowel movement.  She takes a bite or two of food and then is so full or feels like her  stomach is so swollen.    Relevant past medical, surgical, family and social history reviewed and updated as indicated. Interim medical history since our last visit reviewed. Allergies and medications reviewed and updated.  Review of Systems  Eyes:  Negative for visual disturbance.  Respiratory:  Negative for chest tightness and shortness of breath.   Cardiovascular:  Negative for chest pain, palpitations and leg swelling.  Gastrointestinal:  Positive for abdominal pain and constipation.  Endocrine: Negative for polydipsia and polyuria.  Musculoskeletal:        Back pain  Neurological:  Negative for dizziness, light-headedness, numbness and headaches.  Psychiatric/Behavioral:  Negative for dysphoric mood and sleep disturbance.        Tearful    Per HPI unless specifically indicated above     Objective:    BP 131/71   Pulse 77   Temp 97.7 F (36.5 C) (Oral)   Wt 285 lb 4.8 oz (129.4 kg)   SpO2 97%   BMI 52.18 kg/m   Wt Readings from Last 3 Encounters:  02/19/22 285 lb 4.8 oz (129.4 kg)  02/03/22 282 lb 12.8 oz (128.3 kg)  01/27/22 282 lb 1.6 oz (128 kg)    Physical Exam Vitals and nursing note reviewed.  Constitutional:      General: She is not in acute distress.    Appearance: Normal appearance. She is obese. She is not ill-appearing, toxic-appearing or diaphoretic.  HENT:     Head: Normocephalic.     Right Ear: External ear normal.     Left Ear: External ear normal.     Nose: Nose normal.     Mouth/Throat:     Mouth: Mucous membranes are moist.     Pharynx: Oropharynx is clear.  Eyes:     General:        Right eye: No discharge.        Left eye: No discharge.     Extraocular Movements: Extraocular movements intact.     Conjunctiva/sclera: Conjunctivae normal.     Pupils: Pupils are equal, round, and reactive to light.  Cardiovascular:     Rate and Rhythm: Normal rate and regular rhythm.     Heart sounds: No murmur heard. Pulmonary:     Effort: Pulmonary  effort is normal. No respiratory distress.     Breath sounds: Normal breath sounds. No wheezing or rales.  Abdominal:     Tenderness: There is no right CVA tenderness or guarding. Negative signs include Murphy's sign.    Musculoskeletal:     Cervical back: Normal range of motion and neck supple.  Skin:    General: Skin is warm and dry.     Capillary Refill: Capillary refill takes less than 2 seconds.  Neurological:     General: No focal deficit present.     Mental Status: She is alert and oriented to person, place, and time. Mental status is at baseline.  Psychiatric:        Mood and Affect: Mood normal.        Behavior: Behavior normal.        Thought Content: Thought content normal.        Judgment: Judgment normal.     Results for orders placed or performed in visit on 01/27/22  Multiple Myeloma Panel (SPEP&IFE w/QIG)  Result Value Ref Range   IgG (Immunoglobin G), Serum 845 586 - 1,602 mg/dL   IgA 455 (H) 64 - 422 mg/dL   IgM (Immunoglobulin M), Srm 52 26 - 217 mg/dL   Total Protein ELP 6.8 6.0 - 8.5 g/dL   Albumin SerPl Elph-Mcnc 3.7 2.9 - 4.4 g/dL   Alpha 1 0.2 0.0 - 0.4 g/dL   Alpha2 Glob SerPl Elph-Mcnc 0.9 0.4 - 1.0 g/dL   B-Globulin SerPl Elph-Mcnc 1.4 (H) 0.7 - 1.3 g/dL   Gamma Glob SerPl Elph-Mcnc 0.7 0.4 - 1.8 g/dL   M Protein SerPl Elph-Mcnc 0.5 (H) Not Observed g/dL   Globulin, Total 3.1 2.2 - 3.9 g/dL   Albumin/Glob SerPl 1.2 0.7 - 1.7   IFE 1 Comment (A)    Please Note Comment   Kappa/lambda light chains  Result Value Ref Range   Kappa free light chain 22.8 (H) 3.3 - 19.4 mg/L   Lambda free light chains 109.3 (H) 5.7 - 26.3 mg/L   Kappa, lambda light chain ratio 0.21 (L) 0.26 - 1.65  Comprehensive metabolic panel  Result Value Ref Range   Sodium 138 135 - 145 mmol/L   Potassium 3.9 3.5 - 5.1 mmol/L   Chloride 107 98 - 111 mmol/L   CO2 24 22 - 32 mmol/L   Glucose, Bld 138 (H)  70 - 99 mg/dL   BUN 14 8 - 23 mg/dL   Creatinine, Ser 1.21 (H) 0.44 - 1.00  mg/dL   Calcium 8.9 8.9 - 10.3 mg/dL   Total Protein 7.6 6.5 - 8.1 g/dL   Albumin 4.1 3.5 - 5.0 g/dL   AST 23 15 - 41 U/L   ALT 13 0 - 44 U/L   Alkaline Phosphatase 54 38 - 126 U/L   Total Bilirubin 0.7 0.3 - 1.2 mg/dL   GFR, Estimated 47 (L) >60 mL/min   Anion gap 7 5 - 15  CBC with Differential/Platelet  Result Value Ref Range   WBC 6.2 4.0 - 10.5 K/uL   RBC 5.05 3.87 - 5.11 MIL/uL   Hemoglobin 15.5 (H) 12.0 - 15.0 g/dL   HCT 46.7 (H) 36.0 - 46.0 %   MCV 92.5 80.0 - 100.0 fL   MCH 30.7 26.0 - 34.0 pg   MCHC 33.2 30.0 - 36.0 g/dL   RDW 13.2 11.5 - 15.5 %   Platelets 164 150 - 400 K/uL   nRBC 0.0 0.0 - 0.2 %   Neutrophils Relative % 52 %   Neutro Abs 3.4 1.7 - 7.7 K/uL   Lymphocytes Relative 33 %   Lymphs Abs 2.0 0.7 - 4.0 K/uL   Monocytes Relative 10 %   Monocytes Absolute 0.6 0.1 - 1.0 K/uL   Eosinophils Relative 3 %   Eosinophils Absolute 0.2 0.0 - 0.5 K/uL   Basophils Relative 1 %   Basophils Absolute 0.0 0.0 - 0.1 K/uL   Immature Granulocytes 1 %   Abs Immature Granulocytes 0.03 0.00 - 0.07 K/uL      Assessment & Plan:   Problem List Items Addressed This Visit       Respiratory   Sleep apnea    Uses CPAP but feels like it is too much pressure.  States her machine is old.  Will order new Sleep Study for reevaluation.       Relevant Orders   Ambulatory referral to Sleep Studies     Endocrine   DM type 2 with diabetic peripheral neuropathy (HCC)    Chronic.  Controlled.  Continue with current medication regimen.  Labs ordered today.  Return to clinic in 3 months for reevaluation.  Call sooner if concerns arise.        Relevant Medications   benazepril (LOTENSIN) 40 MG tablet   traZODone (DESYREL) 100 MG tablet   pregabalin (LYRICA) 150 MG capsule     Genitourinary   CKD (chronic kidney disease) stage 3, GFR 30-59 ml/min (HCC) - Primary    Chronic.  Controlled.  Continue with current medication regimen.  Rechecked CMP at visit today.  Patient working to  get [name] through PAP.  Return to clinic in 3 months for reevaluation.  Call sooner if concerns arise.        Relevant Orders   Comp Met (CMET)     Other   BMI 50.0-59.9, adult (HCC)    BMI 52. Recommended eating smaller high protein, low fat meals more frequently and exercising 30 mins a day 5 times a week with a goal of 10-15lb weight loss in the next 3 months. Patient voiced their understanding and motivation to adhere to these recommendations.       Back pain    Chronic.  Controlled.  Continue with current medication regimen of Tramadol.  Aware of risks of taking medication.  Okay with continuing.  Labs ordered today.  Return to  clinic in 3 months for reevaluation.  Call sooner if concerns arise.        Relevant Medications   traMADol (ULTRAM) 50 MG tablet   Other Visit Diagnoses     Essential hypertension       Relevant Medications   benazepril (LOTENSIN) 40 MG tablet   metoprolol succinate (TOPROL-XL) 50 MG 24 hr tablet        Follow up plan: Return in about 3 months (around 05/22/2022) for HTN, HLD, DM2 FU.

## 2022-02-19 NOTE — Assessment & Plan Note (Signed)
Uses CPAP but feels like it is too much pressure.  States her machine is old.  Will order new Sleep Study for reevaluation.

## 2022-02-19 NOTE — Assessment & Plan Note (Signed)
Chronic.  Controlled.  Continue with current medication regimen.  Rechecked CMP at visit today.  Patient working to get Farxiga through PAP.  Return to clinic in 3 months for reevaluation.  Call sooner if concerns arise.   

## 2022-02-19 NOTE — Assessment & Plan Note (Signed)
Chronic.  Controlled.  Continue with current medication regimen.  Labs ordered today.  Return to clinic in 3 months for reevaluation.  Call sooner if concerns arise.   

## 2022-02-19 NOTE — Assessment & Plan Note (Signed)
Chronic.  Controlled.  Continue with current medication regimen of Tramadol.  Aware of risks of taking medication.  Okay with continuing.  Labs ordered today.  Return to clinic in 3 months for reevaluation.  Call sooner if concerns arise.

## 2022-02-19 NOTE — Assessment & Plan Note (Signed)
BMI 52. Recommended eating smaller high protein, low fat meals more frequently and exercising 30 mins a day 5 times a week with a goal of 10-15lb weight loss in the next 3 months. Patient voiced their understanding and motivation to adhere to these recommendations.

## 2022-02-19 NOTE — Assessment & Plan Note (Signed)
Chronic. Improved with Linzess. Patient has gained 3lbs since last visit.  She is able to eat 3 meals a day which she hasn't been able to do for several months.  Continue with medication. Refill sent today.  Follow up in 3 months.  Call sooner if concerns arise.

## 2022-02-20 LAB — COMPREHENSIVE METABOLIC PANEL
ALT: 12 IU/L (ref 0–32)
AST: 19 IU/L (ref 0–40)
Albumin/Globulin Ratio: 1.8 (ref 1.2–2.2)
Albumin: 4.5 g/dL (ref 3.8–4.8)
Alkaline Phosphatase: 65 IU/L (ref 44–121)
BUN/Creatinine Ratio: 11 — ABNORMAL LOW (ref 12–28)
BUN: 14 mg/dL (ref 8–27)
Bilirubin Total: 0.4 mg/dL (ref 0.0–1.2)
CO2: 26 mmol/L (ref 20–29)
Calcium: 9.1 mg/dL (ref 8.7–10.3)
Chloride: 98 mmol/L (ref 96–106)
Creatinine, Ser: 1.29 mg/dL — ABNORMAL HIGH (ref 0.57–1.00)
Globulin, Total: 2.5 g/dL (ref 1.5–4.5)
Glucose: 126 mg/dL — ABNORMAL HIGH (ref 70–99)
Potassium: 3.9 mmol/L (ref 3.5–5.2)
Sodium: 139 mmol/L (ref 134–144)
Total Protein: 7 g/dL (ref 6.0–8.5)
eGFR: 44 mL/min/{1.73_m2} — ABNORMAL LOW (ref >59)

## 2022-02-20 NOTE — Progress Notes (Signed)
Please let patient know that her kidney function remains stable which is great news.  Keep drinking water. I will see her at our next visit.

## 2022-03-04 ENCOUNTER — Telehealth: Payer: Self-pay

## 2022-03-04 NOTE — Progress Notes (Signed)
    Chronic Care Management Pharmacy Assistant   Name: TAYLOUR LIETZKE  MRN: [id number] DOB: [birth year]  Patient assistance application for [employer] has been completed on behalf of the patient.  Spoke with patient and made her aware she will need to provide requested income verification to be sent the manufacturer. Application will be mailed on 03/06/22. Patient to bring back to [facility] office for provider to sign/date.   [place] [phone]

## 2022-03-09 ENCOUNTER — Encounter (INDEPENDENT_AMBULATORY_CARE_PROVIDER_SITE_OTHER): Payer: Medicare PPO

## 2022-03-09 ENCOUNTER — Encounter (INDEPENDENT_AMBULATORY_CARE_PROVIDER_SITE_OTHER): Payer: Medicare PPO | Admitting: Vascular Surgery

## 2022-04-01 ENCOUNTER — Telehealth: Payer: Self-pay

## 2022-04-01 NOTE — Chronic Care Management (AMB) (Signed)
/   Chronic Care Management Pharmacy Assistant   Name: TAHIRIH LAIR  MRN: [id number] DOB: [birth year]   Reason for Encounter: Disease State General     Recent office visits:  02/19/22-Karen Mathis Dad, NP (PCP) Presented for abdominal pain. Ambulatory referral to Sleep Studies. Labs ordered. Follow up in 3 months. 01/19/22-Karen Mathis Dad, NP (PCP) Seen for abdominal pain and hypertension. Will start Linzess once daily to help with constipation. Flu vaccine given. Follow up in 1 month. 01/06/22-Karen Mathis Dad, NP (PCP) Seen for general follow up visit. Abdominal x-ray ordered. Ambulatory referral to Vascular Surgery. Return if symptoms worsen or fail to improve. 12/18/21-Karen Mathis Dad, NP (PCP) Seen for general follow up visit. Labs ordered. Ambulatory referral to Sleep Studies. Labs ordered. Ambulatory referral to Gastroenterology. Start on Doxycycline 100 mg twice daily for 3 weeks. Follow up in 1 month. 10/16/21-Karen Mathis Dad, NP (PCP) Seen for general follow up visit. Labs ordered. Will stop Glipizide due to ongoing abdominal pain and a1c being well controlled. Will stop Lovastatin to see if abdominal symptoms improve. Follow up in 2 months.  Recent consult visits:  02/03/22-Fallon E. Owens Shark, NP (Vascular surgery) New patient visit.  01/27/22-Govinda Ann Lions, MD (Oncology) Presented for follow up visit. Follow up in 12 months. 11/17/21-Munsoor Holley Raring, MD (Nephrology) Seen for follow up visit. Follow up in 4 months. 10/08/21-Darren Allen Norris, MD (Gastroenterology) Seen for follow up visit.   Hospital visits:  None in previous 6 months  Medications: Outpatient Encounter Medications as of 04/01/2022  Medication Sig   acetaminophen (TYLENOL) 500 MG tablet Take 2 tablets (1,000 mg total) by mouth every 6 (six) hours as needed for mild pain.   benazepril (LOTENSIN) 40 MG tablet Take 1 tablet (40 mg total) by mouth daily.   dapagliflozin propanediol (FARXIGA) 10 MG TABS tablet  Take 1 tablet (10 mg total) by mouth daily before breakfast.   glucose blood (ACCU-CHEK GUIDE) test strip USE AS INSTRUCTED   linaclotide (LINZESS) 72 MCG capsule Take 1 capsule (72 mcg total) by mouth daily before breakfast.   metoprolol succinate (TOPROL-XL) 50 MG 24 hr tablet Take 1 tablet (50 mg total) by mouth daily. Take with or immediately following a meal.   ondansetron (ZOFRAN) 4 MG tablet Take 1 tablet (4 mg total) by mouth every 6 (six) hours.   pantoprazole (PROTONIX) 40 MG tablet Take 1 tablet (40 mg total) by mouth 2 (two) times daily before a meal.   pregabalin (LYRICA) 150 MG capsule Take 1 capsule (150 mg total) by mouth 3 (three) times daily.   traMADol (ULTRAM) 50 MG tablet Take 1 tablet (50 mg total) by mouth every 8 (eight) hours as needed.   traZODone (DESYREL) 100 MG tablet Take 1 tablet (100 mg total) by mouth at bedtime as needed. for sleep   No facility-administered encounter medications on file as of 04/01/2022.   Contacted Olivia Canter for General Review Call   Chart Review:  Have there been any documented new, changed, or discontinued medications since last visit? Yes (If yes, include name, dose, frequency, date) 01/19/22-Karen Mathis Dad, NP (PCP)  Will start Linzess once daily to help with constipation. 12/18/21-Karen Mathis Dad, NP (PCP)  Start on Doxycycline 100 mg twice daily for 3 weeks. 10/16/21-Karen Mathis Dad, NP (PCP) Will stop Lovastatin to see if abdominal symptoms improve.  Has there been any documented recent hospitalizations or ED visits since last visit with Clinical Pharmacist? No   Unsuccessful attempts to complete assessment call. I have called patient 3x and left 3  voicemail's for the patient to return my call when available.   14. Next visit Type: office       Visit with: [name]        Date::05/25/21        Time:1:40  71. Additional Details? No    Care Gaps: Diabetic kidney evaluation - Urine [id number] since 01/02/2016    Star Rating Drugs: Benazepril 40 mg Last filled:01/15/22 90 DS, 11/17/21 90 DS Farxiga 10 mg Last filled:03/25/22 90 DS,01/09/22 90 DS  Myriam Elta [place], [place]

## 2022-04-03 ENCOUNTER — Other Ambulatory Visit: Payer: Self-pay | Admitting: Nurse Practitioner

## 2022-04-03 NOTE — Telephone Encounter (Signed)
Requested medication (s) are due for refill today:routing for approval  Requested medication (s) are on the active medication list: yes  Last refill:  01/06/22  Future visit scheduled: yes  Notes to clinic:  Unable to refill per protocol, cannot delegate.      Requested Prescriptions  Pending Prescriptions Disp Refills   ondansetron (ZOFRAN) 4 MG tablet [Pharmacy Med Name: ONDANSETRON HCL 4 MG TABLET] 18 tablet 1    Sig: TAKE 1 TABLET BY MOUTH EVERY 6 HOURS.     Not Delegated - Gastroenterology: Antiemetics - ondansetron Failed - 04/03/2022 10:06 AM      Failed - This refill cannot be delegated      Passed - AST in normal range and within 360 days    AST  Date Value Ref Range Status  02/19/2022 19 0 - 40 IU/L Final   AST (SGOT) Piccolo, Waived  Date Value Ref Range Status  03/21/2018 59 (H) 11 - 38 U/L Final         Passed - ALT in normal range and within 360 days    ALT  Date Value Ref Range Status  02/19/2022 12 0 - 32 IU/L Final   ALT (SGPT) Piccolo, Waived  Date Value Ref Range Status  03/21/2018 45 10 - 47 U/L Final         Passed - Valid encounter within last 6 months    Recent Outpatient Visits           1 month ago Stage 3b chronic kidney disease ([place])   [place], Karen, NP   2 months ago Need for influenza vaccination   [facility] [name], NP   2 months ago Generalized abdominal pain   [place], Santiago Glad, NP   3 months ago Hypertension associated with diabetes [facility])   [facility] [name], NP   5 months ago Hypertension associated with diabetes [facility])   [place] [name], NP       Future Appointments             In 1 month [name], NP [facility], [place]

## 2022-04-12 ENCOUNTER — Other Ambulatory Visit: Payer: Self-pay | Admitting: Nurse Practitioner

## 2022-04-12 DIAGNOSIS — E1142 Type 2 diabetes mellitus with diabetic polyneuropathy: Secondary | ICD-10-CM

## 2022-04-12 DIAGNOSIS — I10 Essential (primary) hypertension: Secondary | ICD-10-CM

## 2022-04-13 NOTE — Telephone Encounter (Signed)
Unable to refill per protocol, Rx request is too soon. Last refill 02/19/22 for 90 and 1 refills. Will refuse.  Requested Prescriptions  Pending Prescriptions Disp Refills   pantoprazole (PROTONIX) 40 MG tablet [Pharmacy Med Name: pantoprazole 40 mg tablet,delayed release] 90 tablet 1    Sig: TAKE ONE TABLET BY MOUTH ONCE DAILY     Gastroenterology: Proton Pump Inhibitors Passed - 04/12/2022  8:02 AM      Passed - Valid encounter within last 12 months    Recent Outpatient Visits           1 month ago Stage 3b chronic kidney disease ([place])   [place] [name], NP   2 months ago Need for influenza vaccination   [facility] [name], NP   3 months ago Generalized abdominal pain   [facility] [place], Santiago Glad, NP   3 months ago Hypertension associated with diabetes ([place])   [facility] [name], NP   5 months ago Hypertension associated with diabetes ([place])   [facility] [name], NP       Future Appointments             In 1 month [name], NP [place], [facility]             metoprolol succinate (TOPROL-XL) 50 MG 24 hr tablet [Pharmacy Med Name: metoprolol succinate ER 50 mg tablet,extended release 24 hr] 90 tablet 1    Sig: TAKE ONE TABLET BY MOUTH ONCE DAILY WITH A MEAL     Cardiovascular:  Beta Blockers Passed - 04/12/2022  8:02 AM      Passed - Last BP in normal range    BP Readings from Last 1 Encounters:  02/19/22 131/71         Passed - Last Heart Rate in normal range    Pulse Readings from Last 1 Encounters:  02/19/22 77         Passed - Valid encounter within last 6 months    Recent Outpatient Visits           1 month ago Stage 3b chronic kidney disease ([place])   [place], Karen, NP   2 months ago Need for influenza vaccination   [facility] [name], NP   3 months ago Generalized abdominal  pain   [facility], Santiago Glad, NP   3 months ago Hypertension associated with diabetes ([place])   [facility] [name], NP   5 months ago Hypertension associated with diabetes ([place])   [place], Karen, NP       Future Appointments             In 1 month [name], NP [place], [facility]             pregabalin (LYRICA) 150 MG capsule [Pharmacy Med Name: pregabalin 150 mg capsule] 270 capsule 1    Sig: TAKE ONE CAPSULE BY MOUTH THREE TIMES DAILY     Not Delegated - Neurology:  Anticonvulsants - Controlled - pregabalin Failed - 04/12/2022  8:02 AM      Failed - This refill cannot be delegated      Failed - Cr in normal range and within 360 days    Creatinine, Ser  Date Value Ref Range Status  02/19/2022 1.29 (H) 0.57 - 1.00 mg/dL Final         Passed - Completed PHQ-2 or PHQ-9  in the last 360 days      Passed - Valid encounter within last 12 months    Recent Outpatient Visits           1 month ago Stage 3b chronic kidney disease ([place])   [place], Karen, NP   2 months ago Need for influenza vaccination   [facility] [name], NP   3 months ago Generalized abdominal pain   [facility] [name], NP   3 months ago Hypertension associated with diabetes [facility])   [facility] [place] [name], NP   5 months ago Hypertension associated with diabetes [facility])   [facility] [name], NP       Future Appointments             In 1 month [name], NP [facility], [place]

## 2022-04-20 ENCOUNTER — Other Ambulatory Visit: Payer: Self-pay | Admitting: Nurse Practitioner

## 2022-04-20 DIAGNOSIS — E1142 Type 2 diabetes mellitus with diabetic polyneuropathy: Secondary | ICD-10-CM

## 2022-04-20 DIAGNOSIS — I10 Essential (primary) hypertension: Secondary | ICD-10-CM

## 2022-04-21 NOTE — Telephone Encounter (Signed)
Requested medication (s) are due for refill today: No  Requested medication (s) are on the active medication list: yes    Last refill: 02/19/22  #270 1 refill  Future visit scheduled Yes 05/25/22  Notes to clinic:Request sent via Interface. Cannot refuse non- delegated meds per protocol.  Requested Prescriptions  Pending Prescriptions Disp Refills   pregabalin (LYRICA) 150 MG capsule [Pharmacy Med Name: pregabalin 150 mg capsule] 270 capsule 1    Sig: TAKE ONE CAPSULE BY MOUTH THREE TIMES DAILY     Not Delegated - Neurology:  Anticonvulsants - Controlled - pregabalin Failed - 04/20/2022 11:05 AM      Failed - This refill cannot be delegated      Failed - Cr in normal range and within 360 days    Creatinine, Ser  Date Value Ref Range Status  02/19/2022 1.29 (H) 0.57 - 1.00 mg/dL Final         Passed - Completed PHQ-2 or PHQ-9 in the last 360 days      Passed - Valid encounter within last 12 months    Recent Outpatient Visits           2 months ago Stage 3b chronic kidney disease ([place])   [place], Karen, NP   3 months ago Need for influenza vaccination   [facility] [name], NP   3 months ago Generalized abdominal pain   [facility] [name], NP   4 months ago Hypertension associated with diabetes ([place])   [facility] [name], NP   6 months ago Hypertension associated with diabetes ([place])   [facility] [name], NP       Future Appointments             In 1 month [name], NP [place], [facility]            Refused Prescriptions Disp Refills   benazepril (LOTENSIN) 40 MG tablet [Pharmacy Med Name: benazepril 40 mg tablet] 90 tablet 1    Sig: TAKE ONE TABLET BY MOUTH ONCE DAILY     Cardiovascular:  ACE Inhibitors Failed - 04/20/2022 11:05 AM      Failed - Cr in normal range and within 180 days    Creatinine, Ser  Date Value Ref  Range Status  02/19/2022 1.29 (H) 0.57 - 1.00 mg/dL Final         Passed - K in normal range and within 180 days    Potassium  Date Value Ref Range Status  02/19/2022 3.9 3.5 - 5.2 mmol/L Final         Passed - Patient is not pregnant      Passed - Last BP in normal range    BP Readings from Last 1 Encounters:  02/19/22 131/71         Passed - Valid encounter within last 6 months    Recent Outpatient Visits           2 months ago Stage 3b chronic kidney disease ([place])   [place], Karen, NP   3 months ago Need for influenza vaccination   [facility] [name], NP   3 months ago Generalized abdominal pain   [place], Santiago Glad, NP   4 months ago Hypertension associated with diabetes [facility])   [facility] [name], NP   6 months ago Hypertension associated with diabetes [facility])   [facility] [place], Santiago Glad,  NP       Future Appointments             In 1 month [name], NP [facility], [facility]             metoprolol succinate (TOPROL-XL) 50 MG 24 hr tablet [Pharmacy Med Name: metoprolol succinate ER 50 mg tablet,extended release 24 hr] 90 tablet 1    Sig: TAKE ONE TABLET BY MOUTH ONCE DAILY WITH A MEAL     Cardiovascular:  Beta Blockers Passed - 04/20/2022 11:05 AM      Passed - Last BP in normal range    BP Readings from Last 1 Encounters:  02/19/22 131/71         Passed - Last Heart Rate in normal range    Pulse Readings from Last 1 Encounters:  02/19/22 77         Passed - Valid encounter within last 6 months    Recent Outpatient Visits           2 months ago Stage 3b chronic kidney disease ([place])   [place], Karen, NP   3 months ago Need for influenza vaccination   [facility] [name], NP   3 months ago Generalized abdominal pain   [place], Santiago Glad, NP   4  months ago Hypertension associated with diabetes [facility])   [facility] [name], NP   6 months ago Hypertension associated with diabetes [facility])   [place], Karen, NP       Future Appointments             In 1 month [name], NP [facility], [place]

## 2022-04-21 NOTE — Telephone Encounter (Signed)
Requested Prescriptions  Pending Prescriptions Disp Refills   benazepril (LOTENSIN) 40 MG tablet [Pharmacy Med Name: benazepril 40 mg tablet] 90 tablet 1    Sig: TAKE ONE TABLET BY MOUTH ONCE DAILY     Cardiovascular:  ACE Inhibitors Failed - 04/20/2022 11:05 AM      Failed - Cr in normal range and within 180 days    Creatinine, Ser  Date Value Ref Range Status  02/19/2022 1.29 (H) 0.57 - 1.00 mg/dL Final         Passed - K in normal range and within 180 days    Potassium  Date Value Ref Range Status  02/19/2022 3.9 3.5 - 5.2 mmol/L Final         Passed - Patient is not pregnant      Passed - Last BP in normal range    BP Readings from Last 1 Encounters:  02/19/22 131/71         Passed - Valid encounter within last 6 months    Recent Outpatient Visits           2 months ago Stage 3b chronic kidney disease ([place])   [place], Karen, NP   3 months ago Need for influenza vaccination   [facility] [name], NP   3 months ago Generalized abdominal pain   [facility] [place], Santiago Glad, NP   4 months ago Hypertension associated with diabetes ([place])   [facility] [name], NP   6 months ago Hypertension associated with diabetes ([place])   [facility] [name], NP       Future Appointments             In 1 month [name], NP [place], [facility]             metoprolol succinate (TOPROL-XL) 50 MG 24 hr tablet [Pharmacy Med Name: metoprolol succinate ER 50 mg tablet,extended release 24 hr] 90 tablet 1    Sig: TAKE ONE TABLET BY MOUTH ONCE DAILY WITH A MEAL     Cardiovascular:  Beta Blockers Passed - 04/20/2022 11:05 AM      Passed - Last BP in normal range    BP Readings from Last 1 Encounters:  02/19/22 131/71         Passed - Last Heart Rate in normal range    Pulse Readings from Last 1 Encounters:  02/19/22 77         Passed - Valid encounter  within last 6 months    Recent Outpatient Visits           2 months ago Stage 3b chronic kidney disease ([place])   [place], Karen, NP   3 months ago Need for influenza vaccination   [facility] [name], NP   3 months ago Generalized abdominal pain   [place], Santiago Glad, NP   4 months ago Hypertension associated with diabetes [facility])   [facility] [name], NP   6 months ago Hypertension associated with diabetes [facility])   [place], Karen, NP       Future Appointments             In 1 month [name], NP [place], [facility]             pregabalin (LYRICA) 150 MG capsule [Pharmacy Med Name: pregabalin 150 mg capsule] 270 capsule 1    Sig: TAKE  ONE CAPSULE BY MOUTH THREE TIMES DAILY     Not Delegated - Neurology:  Anticonvulsants - Controlled - pregabalin Failed - 04/20/2022 11:05 AM      Failed - This refill cannot be delegated      Failed - Cr in normal range and within 360 days    Creatinine, Ser  Date Value Ref Range Status  02/19/2022 1.29 (H) 0.57 - 1.00 mg/dL Final         Passed - Completed PHQ-2 or PHQ-9 in the last 360 days      Passed - Valid encounter within last 12 months    Recent Outpatient Visits           2 months ago Stage 3b chronic kidney disease ([place])   [place], Karen, NP   3 months ago Need for influenza vaccination   [facility] [name], NP   3 months ago Generalized abdominal pain   [place], Santiago Glad, NP   4 months ago Hypertension associated with diabetes [facility])   [facility] [name], NP   6 months ago Hypertension associated with diabetes [facility])   [place] [name], NP       Future Appointments             In 1 month [name], NP [facility], [place]

## 2022-05-04 ENCOUNTER — Other Ambulatory Visit: Payer: Self-pay | Admitting: Nurse Practitioner

## 2022-05-04 DIAGNOSIS — G8929 Other chronic pain: Secondary | ICD-10-CM

## 2022-05-05 NOTE — Telephone Encounter (Signed)
Requested medications are due for refill today.  Provider to determine  Requested medications are on the active medications list.  yes  Last refill. 02/19/2022 #60 1 rf  Future visit scheduled.   yes  Notes to clinic.  Refill not delegated.    Requested Prescriptions  Pending Prescriptions Disp Refills   traMADol (ULTRAM) 50 MG tablet [Pharmacy Med Name: TRAMADOL HCL 50 MG TABLET] 60 tablet 1    Sig: TAKE 1 TABLET BY MOUTH EVERY 8 HOURS AS NEEDED.     Not Delegated - Analgesics:  Opioid Agonists Failed - 05/04/2022  9:21 AM      Failed - This refill cannot be delegated      Failed - Urine Drug Screen completed in last 360 days      Passed - Valid encounter within last 3 months    Recent Outpatient Visits           2 months ago Stage 3b chronic kidney disease ([place])   [place] [name], NP   3 months ago Need for influenza vaccination   [place] [name], NP   3 months ago Generalized abdominal pain   [place], Karen, NP   4 months ago Hypertension associated with diabetes [facility])   [place], Karen, NP   6 months ago Hypertension associated with diabetes [facility])   [place] [name], NP       Future Appointments             In 2 weeks [name], NP [place], [facility]

## 2022-05-11 ENCOUNTER — Other Ambulatory Visit: Payer: Self-pay | Admitting: Nurse Practitioner

## 2022-05-11 DIAGNOSIS — E1142 Type 2 diabetes mellitus with diabetic polyneuropathy: Secondary | ICD-10-CM

## 2022-05-11 NOTE — Telephone Encounter (Signed)
Pt no longer using [facility] and needs refill for pregabalin (LYRICA) 150 [facility]   Has the patient contacted their pharmacy? Yes.   (Agent: If no, request that the patient contact the pharmacy for the refill. If patient does not wish to contact the pharmacy document the reason why and proceed with request.) (Agent: If yes, when and what did the pharmacy advise?)  Preferred Pharmacy (with phone number or street name):  [id number] [place]    Has the patient been seen for an appointment in the last year OR does the patient have an upcoming appointment? Yes.    Agent: Please be advised that RX refills may take up to 3 business days. We ask that you follow-up with your pharmacy.

## 2022-05-12 NOTE — Telephone Encounter (Signed)
Requested medication (s) are due for refill today: see below  Requested medication (s) are on the active medication list: yes  Last refill:  02/19/22 #270/1  Future visit scheduled: yes  Notes to clinic:  pt is requesting [facility]t [facility]. Not delegated.     Requested Prescriptions  Pending Prescriptions Disp Refills   pregabalin (LYRICA) 150 MG capsule 270 capsule 1    Sig: Take 1 capsule (150 mg total) by mouth 3 (three) times daily.     Not Delegated - Neurology:  Anticonvulsants - Controlled - pregabalin Failed - 05/11/2022 12:03 PM      Failed - This refill cannot be delegated      Failed - Cr in normal range and within 360 days    Creatinine, Ser  Date Value Ref Range Status  02/19/2022 1.29 (H) 0.57 - 1.00 mg/dL Final         Passed - Completed PHQ-2 or PHQ-9 in the last 360 days      Passed - Valid encounter within last 12 months    Recent Outpatient Visits           2 months ago Stage 3b chronic kidney disease ([place])   [place] [name], NP   3 months ago Need for influenza vaccination   [place] [name], NP   4 months ago Generalized abdominal pain   [place], Karen, NP   4 months ago Hypertension associated with diabetes [facility])   [place], Karen, NP   6 months ago Hypertension associated with diabetes [facility])   [place] [name], NP       Future Appointments             In 1 week [name], NP [place], [facility]

## 2022-05-18 ENCOUNTER — Other Ambulatory Visit: Payer: Self-pay | Admitting: Nurse Practitioner

## 2022-05-18 DIAGNOSIS — E119 Type 2 diabetes mellitus without complications: Secondary | ICD-10-CM

## 2022-05-18 NOTE — Telephone Encounter (Signed)
Future visit in 1 week.  Requested Prescriptions  Pending Prescriptions Disp Refills   ACCU-CHEK GUIDE test strip [Pharmacy Med Name: ACCU-CHEK GUIDE TEST STRIP] 100 strip 0    Sig: USE AS INSTRUCTED     Endocrinology: Diabetes - Testing Supplies Passed - 05/18/2022  1:36 AM      Passed - Valid encounter within last 12 months    Recent Outpatient Visits           2 months ago Stage 3b chronic kidney disease ([place])   [place] [name], NP   3 months ago Need for influenza vaccination   [place] [name], NP   4 months ago Generalized abdominal pain   [place], Karen, NP   5 months ago Hypertension associated with diabetes [facility])   [place], NP   7 months ago Hypertension associated with diabetes [facility])   [place] [name], NP       Future Appointments             In 1 week [name], NP [place], [facility]

## 2022-05-25 ENCOUNTER — Encounter: Payer: Self-pay | Admitting: Nurse Practitioner

## 2022-05-25 ENCOUNTER — Ambulatory Visit: Payer: Medicare PPO | Admitting: Nurse Practitioner

## 2022-05-25 VITALS — BP 103/65 | HR 93 | Temp 98.0°F | Wt 278.6 lb

## 2022-05-25 DIAGNOSIS — R1084 Generalized abdominal pain: Secondary | ICD-10-CM

## 2022-05-25 DIAGNOSIS — E1159 Type 2 diabetes mellitus with other circulatory complications: Secondary | ICD-10-CM | POA: Diagnosis not present

## 2022-05-25 DIAGNOSIS — N1832 Chronic kidney disease, stage 3b: Secondary | ICD-10-CM

## 2022-05-25 DIAGNOSIS — I152 Hypertension secondary to endocrine disorders: Secondary | ICD-10-CM

## 2022-05-25 DIAGNOSIS — E785 Hyperlipidemia, unspecified: Secondary | ICD-10-CM | POA: Diagnosis not present

## 2022-05-25 DIAGNOSIS — E1169 Type 2 diabetes mellitus with other specified complication: Secondary | ICD-10-CM

## 2022-05-25 DIAGNOSIS — E1142 Type 2 diabetes mellitus with diabetic polyneuropathy: Secondary | ICD-10-CM | POA: Diagnosis not present

## 2022-05-25 DIAGNOSIS — Z6841 Body Mass Index (BMI) 40.0 and over, adult: Secondary | ICD-10-CM

## 2022-05-25 DIAGNOSIS — E119 Type 2 diabetes mellitus without complications: Secondary | ICD-10-CM

## 2022-05-25 MED ORDER — LINACLOTIDE 145 MCG PO CAPS: 145.0000 ug | ORAL_CAPSULE | Freq: Every day | ORAL | 1 refills | Status: DC

## 2022-05-25 NOTE — Assessment & Plan Note (Signed)
Ongoing pain.  Not having bowel movements with the Linzess.  Will increase dose to 129mg daily.  Recommend following back up with GI.  Follow up in 1 month.  Call sooner if concerns arise.

## 2022-05-25 NOTE — Assessment & Plan Note (Signed)
Chronic.  Controlled.  Labs ordered today.  Return to clinic in 3 months for reevaluation.  Call sooner if concerns arise.

## 2022-05-25 NOTE — Progress Notes (Signed)
BP 103/65   Pulse 93   Temp 98 F (36.7 C) (Oral)   Wt 278 lb 9.6 oz (126.4 kg)   SpO2 97%   BMI 50.96 kg/m    Subjective:    Patient ID: Anna Juarez, female    DOB: 08-20-1950, 72 y.o.   MRN: [id number]  HPI: Anna Juarez is a 72 y.o. female  Chief Complaint  Patient presents with   Hyperlipidemia   Hypertension   Diabetes    Patient says she has an upcoming Diabetic Eye Exam.   DIABETES Hypoglycemic episodes: no Polydipsia/polyuria: no Visual disturbance: no Chest pain: no Paresthesias: no Glucose Monitoring: yes  Accucheck frequency: Daily  Fasting glucose:   Post prandial:  Evening:  Before meals: Taking Insulin?: no  Long acting insulin:  Short acting insulin: Blood Pressure Monitoring: not checking Retinal Examination: Up to Date Foot Exam: Not up to Date Diabetic Education: Not Completed Pneumovax: Up to Date Influenza: Not up to Date Aspirin: no  HYPERTENSION / HYPERLIPIDEMIA Satisfied with current treatment? no Duration of hypertension: years BP monitoring frequency: not checking BP range:  BP medication side effects: no Past BP meds: benazepril Duration of hyperlipidemia: years Cholesterol medication side effects: no Cholesterol supplements: none Past cholesterol medications: lovastatin Medication compliance: excellent compliance Aspirin: no Recent stressors: no Recurrent headaches: no Visual changes: no Palpitations: no Dyspnea: no Chest pain: no Lower extremity edema: no Dizzy/lightheaded: no  CHRONIC KIDNEY DISEASE CKD status: controlled Medications renally dose: yes Previous renal evaluation: yes Pneumovax:  Up to Date Influenza Vaccine:  Not up to Date  STOMACH PAIN Patient states she still has some stomach pain.  Some days she is good.  Some days are not good.  She takes the Linzess everyday that she is home.  But can't take it when she is out.  She is drinking about 40 ounces of liquid per day.  Feels better when she  has a bowel movement.  Linzess doesn't feel like it is helping as much as it was before.    Relevant past medical, surgical, family and social history reviewed and updated as indicated. Interim medical history since our last visit reviewed. Allergies and medications reviewed and updated.  Review of Systems  Eyes:  Negative for visual disturbance.  Respiratory:  Negative for chest tightness and shortness of breath.   Cardiovascular:  Negative for chest pain, palpitations and leg swelling.  Gastrointestinal:  Positive for abdominal pain, constipation and nausea.  Endocrine: Negative for polydipsia and polyuria.  Musculoskeletal:        Hip pain  Neurological:  Negative for dizziness, light-headedness, numbness and headaches.  Psychiatric/Behavioral:  Negative for dysphoric mood and sleep disturbance.     Per HPI unless specifically indicated above     Objective:    BP 103/65   Pulse 93   Temp 98 F (36.7 C) (Oral)   Wt 278 lb 9.6 oz (126.4 kg)   SpO2 97%   BMI 50.96 kg/m   Wt Readings from Last 3 Encounters:  05/25/22 278 lb 9.6 oz (126.4 kg)  02/19/22 285 lb 4.8 oz (129.4 kg)  02/03/22 282 lb 12.8 oz (128.3 kg)    Physical Exam Vitals and nursing note reviewed.  Constitutional:      General: She is not in acute distress.    Appearance: Normal appearance. She is obese. She is not ill-appearing, toxic-appearing or diaphoretic.  HENT:     Head: Normocephalic.     Right Ear: External ear  normal.     Left Ear: External ear normal.     Nose: Nose normal.     Mouth/Throat:     Mouth: Mucous membranes are moist.     Pharynx: Oropharynx is clear.  Eyes:     General:        Right eye: No discharge.        Left eye: No discharge.     Extraocular Movements: Extraocular movements intact.     Conjunctiva/sclera: Conjunctivae normal.     Pupils: Pupils are equal, round, and reactive to light.  Cardiovascular:     Rate and Rhythm: Normal rate and regular rhythm.     Heart  sounds: No murmur heard. Pulmonary:     Effort: Pulmonary effort is normal. No respiratory distress.     Breath sounds: Normal breath sounds. No wheezing or rales.  Abdominal:     Tenderness: There is abdominal tenderness in the right upper quadrant. There is no right CVA tenderness or guarding. Negative signs include Murphy's sign.  Musculoskeletal:     Cervical back: Normal range of motion and neck supple.  Skin:    General: Skin is warm and dry.     Capillary Refill: Capillary refill takes less than 2 seconds.  Neurological:     General: No focal deficit present.     Mental Status: She is alert and oriented to person, place, and time. Mental status is at baseline.  Psychiatric:        Mood and Affect: Mood normal.        Behavior: Behavior normal.        Thought Content: Thought content normal.        Judgment: Judgment normal.     Comments: tearful     Results for orders placed or performed in visit on 02/19/22  Comp Met (CMET)  Result Value Ref Range   Glucose 126 (H) 70 - 99 mg/dL   BUN 14 8 - 27 mg/dL   Creatinine, Ser 1.29 (H) 0.57 - 1.00 mg/dL   eGFR 44 (L) >59 mL/min/1.73   BUN/Creatinine Ratio 11 (L) 12 - 28   Sodium 139 134 - 144 mmol/L   Potassium 3.9 3.5 - 5.2 mmol/L   Chloride 98 96 - 106 mmol/L   CO2 26 20 - 29 mmol/L   Calcium 9.1 8.7 - 10.3 mg/dL   Total Protein 7.0 6.0 - 8.5 g/dL   Albumin 4.5 3.8 - 4.8 g/dL   Globulin, Total 2.5 1.5 - 4.5 g/dL   Albumin/Globulin Ratio 1.8 1.2 - 2.2   Bilirubin Total 0.4 0.0 - 1.2 mg/dL   Alkaline Phosphatase 65 44 - 121 IU/L   AST 19 0 - 40 IU/L   ALT 12 0 - 32 IU/L      Assessment & Plan:   Problem List Items Addressed This Visit       Cardiovascular and Mediastinum   Hypertension associated with diabetes ([place]) - Primary    Chronic.  Controlled.  Continue with current medication regimen of Benzapril 19m daily.  Labs ordered today.  Return to clinic in 3 months for reevaluation.  Call sooner if concerns arise.         Relevant Orders   Comp Met (CMET)     Endocrine   DM type 2 with diabetic peripheral neuropathy (HCC)    Chronic.  Controlled.  Continue with current medication regimen of Lyrica BID.  Labs ordered today.  Return to clinic in 3 months for reevaluation.  Call sooner if concerns arise.        Hyperlipidemia associated with type 2 diabetes mellitus (HCC)    Chronic.  Controlled.  Labs ordered today.  Return to clinic in 3 months for reevaluation.  Call sooner if concerns arise.        Relevant Orders   Lipid Profile   Diabetes mellitus without complication (HCC)    Chronic.  Controlled.  A1c is 6.9.  Continue with current medication regimen on Farxiga 48m daily.  Microalbumin ordered today.  Labs ordered today.  Return to clinic in 3 months for reevaluation.  Call sooner if concerns arise.        Relevant Orders   HgB A1c   Urine Microalbumin w/creat. ratio     Genitourinary   CKD (chronic kidney disease) stage 3, GFR 30-59 ml/min (HCC)    Chronic.  Controlled.  Continue with current medication regimen.  Rechecked CMP at visit today.  Patient working to get [employer] PAP.  Return to clinic in 3 months for reevaluation.  Call sooner if concerns arise.          Other   BMI 50.0-59.9, adult (HCC)    Recommended eating smaller high protein, low fat meals more frequently and exercising 30 mins a day 5 times a week with a goal of 10-15lb weight loss in the next 3 months.       Generalized abdominal pain    Ongoing pain.  Not having bowel movements with the Linzess.  Will increase dose to 1435m daily.  Recommend following back up with GI.  Follow up in 1 month.  Call sooner if concerns arise.         Follow up plan: Return in about 1 month (around 06/23/2022) for Abdominal Pain.

## 2022-05-25 NOTE — Assessment & Plan Note (Signed)
Chronic.  Controlled.  Continue with current medication regimen of Benzapril 91m daily.  Labs ordered today.  Return to clinic in 3 months for reevaluation.  Call sooner if concerns arise.

## 2022-05-25 NOTE — Assessment & Plan Note (Signed)
Recommended eating smaller high protein, low fat meals more frequently and exercising 30 mins a day 5 times a week with a goal of 10-15lb weight loss in the next 3 months.  

## 2022-05-25 NOTE — Assessment & Plan Note (Signed)
Chronic.  Controlled.  Continue with current medication regimen of Lyrica BID.  Labs ordered today.  Return to clinic in 3 months for reevaluation.  Call sooner if concerns arise.

## 2022-05-25 NOTE — Assessment & Plan Note (Signed)
Chronic.  Controlled.  Continue with current medication regimen.  Rechecked CMP at visit today.  Patient working to get [name] through PAP.  Return to clinic in 3 months for reevaluation.  Call sooner if concerns arise.

## 2022-05-25 NOTE — Assessment & Plan Note (Addendum)
Chronic.  Controlled.  A1c is 6.9.  Continue with current medication regimen on Farxiga 61m daily.  Microalbumin ordered today.  Labs ordered today.  Return to clinic in 3 months for reevaluation.  Call sooner if concerns arise.

## 2022-05-26 LAB — COMPREHENSIVE METABOLIC PANEL
ALT: 12 IU/L (ref 0–32)
AST: 24 IU/L (ref 0–40)
Albumin/Globulin Ratio: 1.8 (ref 1.2–2.2)
Albumin: 4.4 g/dL (ref 3.8–4.8)
Alkaline Phosphatase: 69 IU/L (ref 44–121)
BUN/Creatinine Ratio: 11 — ABNORMAL LOW (ref 12–28)
BUN: 14 mg/dL (ref 8–27)
Bilirubin Total: 0.5 mg/dL (ref 0.0–1.2)
CO2: 23 mmol/L (ref 20–29)
Calcium: 9.6 mg/dL (ref 8.7–10.3)
Chloride: 101 mmol/L (ref 96–106)
Creatinine, Ser: 1.32 mg/dL — ABNORMAL HIGH (ref 0.57–1.00)
Globulin, Total: 2.4 g/dL (ref 1.5–4.5)
Glucose: 127 mg/dL — ABNORMAL HIGH (ref 70–99)
Potassium: 4.4 mmol/L (ref 3.5–5.2)
Sodium: 140 mmol/L (ref 134–144)
Total Protein: 6.8 g/dL (ref 6.0–8.5)
eGFR: 43 mL/min/{1.73_m2} — ABNORMAL LOW (ref >59)

## 2022-05-26 LAB — HEMOGLOBIN A1C
Est. average glucose Bld gHb Est-mCnc: 163 mg/dL
Hgb A1c MFr Bld: 7.3 % — ABNORMAL HIGH (ref 4.8–5.6)

## 2022-05-26 LAB — LIPID PANEL
Chol/HDL Ratio: 3.9 ratio (ref 0.0–4.4)
Cholesterol, Total: 216 mg/dL — ABNORMAL HIGH (ref 100–199)
HDL: 55 mg/dL (ref >39)
LDL Chol Calc (NIH): 121 mg/dL — ABNORMAL HIGH (ref 0–99)
Triglycerides: 232 mg/dL — ABNORMAL HIGH (ref 0–149)
VLDL Cholesterol Cal: 40 mg/dL (ref 5–40)

## 2022-05-26 LAB — MICROALBUMIN / CREATININE URINE RATIO
Creatinine, Urine: 75.6 mg/dL
Microalb/Creat Ratio: 18 mg/g creat (ref 0–29)
Microalbumin, Urine: 13.4 ug/mL

## 2022-05-26 NOTE — Progress Notes (Signed)
Please let patient know that her lab work looks good.  A1c is stable at 7.3%.  Her kidney function remains stable.  No concerns at this time.  Continue with the plan as discussed during our visit.

## 2022-06-15 ENCOUNTER — Ambulatory Visit (INDEPENDENT_AMBULATORY_CARE_PROVIDER_SITE_OTHER): Payer: Medicare PPO | Admitting: Internal Medicine

## 2022-06-15 VITALS — BP 98/72 | HR 100 | Resp 12 | Ht 62.0 in | Wt 270.0 lb

## 2022-06-15 DIAGNOSIS — G4733 Obstructive sleep apnea (adult) (pediatric): Secondary | ICD-10-CM

## 2022-06-15 DIAGNOSIS — E1159 Type 2 diabetes mellitus with other circulatory complications: Secondary | ICD-10-CM | POA: Diagnosis not present

## 2022-06-15 DIAGNOSIS — I152 Hypertension secondary to endocrine disorders: Secondary | ICD-10-CM

## 2022-06-15 DIAGNOSIS — Z7189 Other specified counseling: Secondary | ICD-10-CM | POA: Insufficient documentation

## 2022-06-15 NOTE — Progress Notes (Signed)
Sleep Medicine   Office Visit  Patient Name: Anna Juarez DOB: 02-Dec-1950 MRN [id number]    Chief Complaint: establish care on cpap  Brief History:  Anna Juarez presents with a 13 year history of sleep apnea. The patient currently uses CPAP every night.   Sleep quality is good. This is noted most nights. The patient's bed partner reports isn't available to note symptoms at night. The patient relates the following symptoms: gasping and daytime sleepiness are also present. The patient goes to sleep at 10:30pm and wakes up at 8:30am.  Sleep quality is worse when outside home environment.  Patient has noted restlessness of her legs at night in past,  but it is not currently symptomatic.   The patient  relates no unusual behavior during the night.  The patient denies a history of psychiatric problems. The Epworth Sleepiness Score is 2 out of 24 .  The patient relates  Cardiovascular risk factors include: hypertension.     ROS  General: (-) fever, (-) chills, (-) night sweat Nose and Sinuses: (-) nasal stuffiness or itchiness, (-) postnasal drip, (-) nosebleeds, (-) sinus trouble. Mouth and Throat: (-) sore throat, (-) hoarseness. Neck: (-) swollen glands, (-) enlarged thyroid, (-) neck pain. Respiratory: - cough, - shortness of breath, - wheezing. Neurologic: - numbness, - tingling. Psychiatric: - anxiety, - depression Sleep behavior: -sleep paralysis -hypnogogic hallucinations -dream enactment      -vivid dreams -cataplexy -night terrors -sleep walking   Current Medication: Outpatient Encounter Medications as of 06/15/2022  Medication Sig   LINZESS 72 MCG capsule Take 72 mcg by mouth every morning.   ACCU-CHEK GUIDE test strip USE AS INSTRUCTED   acetaminophen (TYLENOL) 500 MG tablet Take 2 tablets (1,000 mg total) by mouth every 6 (six) hours as needed for mild pain.   benazepril (LOTENSIN) 40 MG tablet Take 1 tablet (40 mg total) by mouth daily.   dapagliflozin propanediol (FARXIGA) 10 MG  TABS tablet Take 1 tablet (10 mg total) by mouth daily before breakfast.   metoprolol succinate (TOPROL-XL) 50 MG 24 hr tablet Take 1 tablet (50 mg total) by mouth daily. Take with or immediately following a meal.   ondansetron (ZOFRAN) 4 MG tablet TAKE 1 TABLET BY MOUTH EVERY 6 HOURS.   pantoprazole (PROTONIX) 40 MG tablet Take 1 tablet (40 mg total) by mouth 2 (two) times daily before a meal.   pregabalin (LYRICA) 150 MG capsule Take 1 capsule (150 mg total) by mouth 3 (three) times daily.   SODIUM FLUORIDE 5000 PPM 1.1 % PSTE See admin instructions.   traMADol (ULTRAM) 50 MG tablet TAKE 1 TABLET BY MOUTH EVERY 8 HOURS AS NEEDED.   traZODone (DESYREL) 100 MG tablet Take 1 tablet (100 mg total) by mouth at bedtime as needed. for sleep   [DISCONTINUED] linaclotide (LINZESS) 145 MCG CAPS capsule Take 1 capsule (145 mcg total) by mouth daily before breakfast.   No facility-administered encounter medications on file as of 06/15/2022.    Surgical History: Past Surgical History:  Procedure Laterality Date   ABDOMINAL HYSTERECTOMY     CATARACT EXTRACTION W/PHACO Right 06/02/2021   Procedure: CATARACT EXTRACTION PHACO AND INTRAOCULAR LENS PLACEMENT ([place]) RIGHT DIABETIC;  Surgeon: Eulogio Bear, MD;  Location: [facility];  Service: Ophthalmology;  Laterality: Right;  2.52 0:22.8   CATARACT EXTRACTION W/PHACO Left 06/16/2021   Procedure: CATARACT EXTRACTION PHACO AND INTRAOCULAR LENS PLACEMENT ([facility]) LEFT DIABETIC 3.51 00:30.9;  Surgeon: Eulogio Bear, MD;  Location: [facility];  Service: Ophthalmology;  Laterality: Left;   CERVICAL FUSION     x2   CHOLECYSTECTOMY     COLONOSCOPY     COLONOSCOPY WITH PROPOFOL N/A 09/09/2021   Procedure: COLONOSCOPY WITH PROPOFOL;  Surgeon: Lucilla Lame, MD;  [facility] ENDOSCOPY;  Service: Endoscopy;  Laterality: N/A;   ESOPHAGOGASTRODUODENOSCOPY (EGD) WITH PROPOFOL N/A 09/09/2021   Procedure: ESOPHAGOGASTRODUODENOSCOPY (EGD) WITH  PROPOFOL;  Surgeon: Lucilla Lame, MD;  Location: [facility] ENDOSCOPY;  Service: Endoscopy;  Laterality: N/A;   JOINT REPLACEMENT Bilateral    knees    Medical History: Past Medical History:  Diagnosis Date   Arthritis    Chronic kidney disease    Complication of anesthesia    Diabetes mellitus without complication (HCC)    Extreme obesity    Fatty liver    GERD (gastroesophageal reflux disease)    Hyperlipidemia    Hypertension    Palpitations    PONV (postoperative nausea and vomiting)    Radiculopathy, cervical    Renal insufficiency    stage 3   Sleep apnea    uses cpap   Tear of acetabular labrum 01/2021   right    Family History: Non contributory to the present illness  Social History: Social History   Socioeconomic History   Marital status: Married    Spouse name: Not on file   Number of children: Not on file   Years of education: Not on file   Highest education level: 12th grade  Occupational History   Occupation: retired   Tobacco Use   Smoking status: Never    Passive exposure: Never   Smokeless tobacco: Never  Vaping Use   Vaping Use: Never used  Substance and Sexual Activity   Alcohol use: No   Drug use: No   Sexual activity: Not Currently  Other Topics Concern   Not on file  Social History Narrative   [place]; lives with husband; never smoker; no alcohol; retd. Manufacturing.    Social Determinants of Health   Financial Resource Strain: Low Risk  (09/29/2021)   Overall Financial Resource Strain (CARDIA)    Difficulty of Paying Living Expenses: Not hard at all  Food Insecurity: No Food Insecurity (09/29/2021)   Hunger Vital Sign    Worried About Running Out of Food in the Last Year: Never true    Ran Out of Food in the Last Year: Never true  Transportation Needs: No Transportation Needs (09/29/2021)   PRAPARE - Hydrologist (Medical): No    Lack of Transportation (Non-Medical): No  Physical Activity:  Insufficiently Active (09/29/2021)   Exercise Vital Sign    Days of Exercise per Week: 2 days    Minutes of Exercise per Session: 10 min  Stress: No Stress Concern Present (09/29/2021)   [place]    Feeling of Stress : Only a little  Social Connections: Moderately Isolated (09/29/2021)   Social Connection and Isolation Panel [NHANES]    Frequency of Communication with Friends and Family: More than three times a week    Frequency of Social Gatherings with Friends and Family: Once a week    Attends Religious Services: More than 4 times per year    Active Member of [employer] or Organizations: No    Attends Archivist Meetings: Never    Marital Status: Widowed  Intimate Partner Violence: Not At Risk (09/29/2021)   Humiliation, Afraid, Rape, and Kick questionnaire  Fear of Current or Ex-Partner: No    Emotionally Abused: No    Physically Abused: No    Sexually Abused: No    Vital Signs: Blood pressure 98/72, pulse 100, resp. rate 12, height '5\' 2"'$  (1.575 m), weight 270 lb (122.5 kg), SpO2 92 %. Body mass index is 49.38 kg/m.   Examination: General Appearance: The patient is well-developed, well-nourished, and in no distress. Neck Circumference: 38 cm Skin: Gross inspection of skin unremarkable. Head: normocephalic, no gross deformities. Eyes: no gross deformities noted. ENT: ears appear grossly normal Neurologic: Alert and oriented. No involuntary movements.    STOP BANG RISK ASSESSMENT S (snore) Have you been told that you snore?     NO   T (tired) Are you often tired, fatigued, or sleepy during the day?   YES  O (obstruction) Do you stop breathing, choke, or gasp during sleep? YES   P (pressure) Do you have or are you being treated for high blood pressure? YES   B (BMI) Is your body index greater than 35 kg/m? YES   A (age) Are you 45 years old or older? YES   N (neck) Do you have a neck  circumference greater than 16 inches?   NO   G (gender) Are you a female? NO   TOTAL STOP/BANG "YES" ANSWERS 5                                                               A STOP-Bang score of 2 or less is considered low risk, and a score of 5 or more is high risk for having either moderate or severe OSA. For people who score 3 or 4, doctors may need to perform further assessment to determine how likely they are to have OSA.         EPWORTH SLEEPINESS SCALE:  Scale:  (0)= no chance of dozing; (1)= slight chance of dozing; (2)= moderate chance of dozing; (3)= high chance of dozing  Chance  Situtation    Sitting and reading: 0    Watching TV: 1    Sitting Inactive in public: 0    As a passenger in car: 0      Lying down to rest: 0    Sitting and talking: 0    Sitting quielty after lunch: 1    In a car, stopped in traffic: 0   TOTAL SCORE:   2 out of 24    SLEEP STUDIES:  Has had sleep studies but we don't have copies   LABS: Recent Results (from the past 2160 hour(s))  Comp Met (CMET)     Status: Abnormal   Collection Time: 05/25/22  2:12 PM  Result Value Ref Range   Glucose 127 (H) 70 - 99 mg/dL   BUN 14 8 - 27 mg/dL   Creatinine, Ser 1.32 (H) 0.57 - 1.00 mg/dL   eGFR 43 (L) >59 mL/min/1.73   BUN/Creatinine Ratio 11 (L) 12 - 28   Sodium 140 134 - 144 mmol/L   Potassium 4.4 3.5 - 5.2 mmol/L   Chloride 101 96 - 106 mmol/L   CO2 23 20 - 29 mmol/L   Calcium 9.6 8.7 - 10.3 mg/dL   Total Protein 6.8 6.0 - 8.5 g/dL   Albumin 4.4 3.8 -  4.8 g/dL   Globulin, Total 2.4 1.5 - 4.5 g/dL   Albumin/Globulin Ratio 1.8 1.2 - 2.2   Bilirubin Total 0.5 0.0 - 1.2 mg/dL   Alkaline Phosphatase 69 44 - 121 IU/L   AST 24 0 - 40 IU/L   ALT 12 0 - 32 IU/L  Lipid Profile     Status: Abnormal   Collection Time: 05/25/22  2:12 PM  Result Value Ref Range   Cholesterol, Total 216 (H) 100 - 199 mg/dL   Triglycerides 232 (H) 0 - 149 mg/dL   HDL 55 >39 mg/dL   VLDL Cholesterol  Cal 40 5 - 40 mg/dL   LDL Chol Calc (NIH) 121 (H) 0 - 99 mg/dL   Chol/HDL Ratio 3.9 0.0 - 4.4 ratio    Comment:                                   T. Chol/HDL Ratio                                             Men  Women                               1/2 Avg.Risk  3.4    3.3                                   Avg.Risk  5.0    4.4                                2X Avg.Risk  9.6    7.1                                3X Avg.Risk 23.4   11.0   HgB A1c     Status: Abnormal   Collection Time: 05/25/22  2:12 PM  Result Value Ref Range   Hgb A1c MFr Bld 7.3 (H) 4.8 - 5.6 %    Comment:          Prediabetes: 5.7 - 6.4          Diabetes: >6.4          Glycemic control for adults with diabetes: <7.0    Est. average glucose Bld gHb Est-mCnc 163 mg/dL  Urine Microalbumin w/creat. ratio     Status: None   Collection Time: 05/25/22  2:13 PM  Result Value Ref Range   Creatinine, Urine 75.6 Not Estab. mg/dL   Microalbumin, Urine 13.4 Not Estab. ug/mL   Microalb/Creat Ratio 18 0 - 29 mg/g creat    Comment:                        Normal:                0 -  29                        Moderately increased: 30 - 300  Severely increased:       >300     Radiology: DG Abd 2 Views  Result Date: 01/07/2022 CLINICAL DATA:  Abdominal pain EXAM: ABDOMEN - 2 VIEW COMPARISON:  Abdominal radiograph dated December 21, 2013 FINDINGS: The bowel gas pattern is normal. Large stool burden. There is no evidence of free air. Pelvic calcifications which are likely phleboliths. Mild dextrocurvature and moderate degenerative changes of the lumbar spine. IMPRESSION: Nonobstructive bowel gas pattern. Large stool burden. Electronically Signed   By: Yetta Glassman M.D.   On: 01/07/2022 11:54    No results found.  No results found.    Assessment and Plan: Patient Active Problem List   Diagnosis Date Noted   OSA (obstructive sleep apnea) 06/15/2022   CPAP use counseling 06/15/2022   Morbid obesity  ([place]) 06/15/2022   Sleep apnea    Generalized abdominal pain    Polyp of ascending colon    Gastritis without bleeding    BRBPR (bright red blood per rectum) 06/10/2021   Constipation 06/10/2021   Symptomatic cholelithiasis    Diabetes mellitus without complication (HCC)    Monoclonal gammopathy 02/20/2020   MGUS (monoclonal gammopathy of unknown significance) 02/08/2018   CKD (chronic kidney disease) stage 3, GFR 30-59 ml/min (HCC) 01/04/2018   Advanced care planning/counseling discussion 11/24/2016   Back pain 07/22/2016   BMI 50.0-59.9, adult ([place]) 10/02/2014   DM type 2 with diabetic peripheral neuropathy ([place])    Hyperlipidemia associated with type 2 diabetes mellitus ([place])    Hypertension associated with diabetes ([place])    1. OSA (obstructive sleep apnea) PLAN OSA:   Patient evaluation suggests high risk of sleep disordered breathing due to history of OSA on cpap. No prior studies are available. She has recurrence of her gasping and snoring and daytime somnolence in the last two years. She reports having to sit up at night to breathe from time to time.  Patient has comorbid cardiovascular risk factors including: hypertension which could be exacerbated by pathologic sleep-disordered breathing.  Suggest: split study  to assess/treat the patient's sleep disordered breathing. The patient was also counselled on weight loss to optimize sleep health.   2. CPAP use counseling CPAP Counseling: had a lengthy discussion with the patient regarding the importance of PAP therapy in management of the sleep apnea. Patient appears to understand the risk factor reduction and also understands the risks associated with untreated sleep apnea. Patient will try to make a good faith effort to remain compliant with therapy. Also instructed the patient on proper cleaning of the device including the water must be changed daily if possible and use of distilled water is preferred. Patient understands that the  machine should be regularly cleaned with appropriate recommended cleaning solutions that do not damage the PAP machine for example given white vinegar and water rinses. Other methods such as ozone treatment may not be as good as these simple methods to achieve cleaning.   3. Hypertension associated with diabetes ([place]) Hypertension Counseling:   The following hypertensive lifestyle modification were recommended and discussed:  1. Limiting alcohol intake to less than 1 oz/day of ethanol:(24 oz of beer or 8 oz of wine or 2 oz of 100-proof whiskey). 2. Take baby ASA 81 mg daily. 3. Importance of regular aerobic exercise and losing weight. 4. Reduce dietary saturated fat and cholesterol intake for overall cardiovascular health. 5. Maintaining adequate dietary potassium, calcium, and magnesium intake. 6. Regular monitoring of the blood pressure. 7. Reduce sodium intake to less than  100 mmol/day (less than 2.3 gm of sodium or less than 6 gm of sodium choride)    4. Morbid obesity ([place]) Obesity Counseling: Had a lengthy discussion regarding patients BMI and weight issues. Patient was instructed on portion control as well as increased activity. Also discussed caloric restrictions with trying to maintain intake less than 2000 Kcal. Discussions were made in accordance with the 5As of weight management. Simple actions such as not eating late and if able to, taking a walk is suggested.     General Counseling: I have discussed the findings of the evaluation and examination with Teaira.  I have also discussed any further diagnostic evaluation thatmay be needed or ordered today. Sarafina verbalizes understanding of the findings of todays visit. We also reviewed her medications today and discussed drug interactions and side effects including but not limited excessive drowsiness and altered mental states. We also discussed that there is always a risk not just to her but also people around her. she has been encouraged to  call the office with any questions or concerns that should arise related to todays visit.  No orders of the defined types were placed in this encounter.       I have personally obtained a history, evaluated the patient, evaluated pertinent data, formulated the assessment and plan and placed orders.  This patient was seen today by Tressie Ellis, PA-C in collaboration with Dr. Devona Konig.    Allyne Gee, MD [facility] Diplomate ABMS Pulmonary and Critical Care Medicine Sleep medicine

## 2022-06-15 NOTE — Patient Instructions (Signed)

## 2022-06-21 ENCOUNTER — Other Ambulatory Visit: Payer: Self-pay | Admitting: Nurse Practitioner

## 2022-06-21 DIAGNOSIS — M545 Low back pain, unspecified: Secondary | ICD-10-CM

## 2022-06-23 NOTE — Telephone Encounter (Signed)
Requested medication (s) are due for refill today: yes  Requested medication (s) are on the active medication list: yes  Last refill:  multiple dates  Future visit scheduled: yes  Notes to clinic:  Unable to refill per protocol, cannot delegate tramadol. Linzess last refilled by historical provider.      Requested Prescriptions  Pending Prescriptions Disp Refills   traMADol (ULTRAM) 50 MG tablet [Pharmacy Med Name: TRAMADOL HCL 50 MG TABLET] 60 tablet 1    Sig: TAKE 1 TABLET BY MOUTH EVERY 8 HOURS AS NEEDED.     Not Delegated - Analgesics:  Opioid Agonists Failed - 06/21/2022  6:56 PM      Failed - This refill cannot be delegated      Failed - Urine Drug Screen completed in last 360 days      Passed - Valid encounter within last 3 months    Recent Outpatient Visits           4 weeks ago Hypertension associated with diabetes [facility])   [place] [name], NP   4 months ago Stage 3b chronic kidney disease [facility])   [place] [name], NP   5 months ago Need for influenza vaccination   [place] [name], NP   5 months ago Generalized abdominal pain   [place] [name], NP   6 months ago Hypertension associated with diabetes [facility])   [place] [name], NP       Future Appointments             Tomorrow [name], NP [place], [facility]   In 1 month Lucilla Lame, MD [facility] at [facility] 72 MCG capsule [Pharmacy Med Name: LINZESS 72 MCG CAPSULE] 90 capsule 1    Sig: TAKE 1 [place].     Gastroenterology: Irritable Bowel Syndrome Passed - 06/21/2022  6:56 PM      Passed - Valid encounter within last 12 months    Recent Outpatient Visits           4 weeks ago Hypertension associated  with diabetes [facility])   [place] [name], NP   4 months ago Stage 3b chronic kidney disease [facility])   [place] [name], NP   5 months ago Need for influenza vaccination   [place] [name], NP   5 months ago Generalized abdominal pain   [place], Karen, NP   6 months ago Hypertension associated with diabetes [facility])   [place] [name], NP       Future Appointments             Tomorrow [name], NP [place], [facility]   In 1 month Lucilla Lame, MD [facility] [facility] at [facility]

## 2022-06-24 ENCOUNTER — Ambulatory Visit: Payer: Medicare PPO | Admitting: Nurse Practitioner

## 2022-06-24 ENCOUNTER — Telehealth: Payer: Self-pay

## 2022-06-24 ENCOUNTER — Encounter: Payer: Self-pay | Admitting: Nurse Practitioner

## 2022-06-24 VITALS — BP 137/79 | HR 108 | Temp 97.9°F | Wt 279.3 lb

## 2022-06-24 DIAGNOSIS — K5900 Constipation, unspecified: Secondary | ICD-10-CM

## 2022-06-24 MED ORDER — LINACLOTIDE 145 MCG PO CAPS: 145.0000 ug | ORAL_CAPSULE | Freq: Every day | ORAL | 1 refills | Status: DC

## 2022-06-24 MED ORDER — PREGABALIN ER 165 MG PO TB24: 1.0000 | ORAL_TABLET | Freq: Two times a day (BID) | ORAL | 1 refills | Status: DC

## 2022-06-24 NOTE — Assessment & Plan Note (Signed)
Chronic. Ongoing. Constipation has improved with the Linzess 17mcg dose.  However, she is still having abdominal pain that worsens in the evening.  Will change Lyrica to CR to see if pain improves.  Keep appt with GI in May.  Follow up in 2 months.  Call soon if concerns.

## 2022-06-24 NOTE — Progress Notes (Signed)
BP 137/79   Pulse (!) 108   Temp 97.9 F (36.6 C) (Oral)   Wt 279 lb 4.8 oz (126.7 kg)   SpO2 96%   BMI 51.08 kg/m    Subjective:    Patient ID: Anna Juarez, female    DOB: Feb 20, 1951, 72 y.o.   MRN: [id number]  HPI: Anna Juarez is a 72 y.o. female  Chief Complaint  Patient presents with   Abdominal Pain    1 month f/up- pt states she is still having abdominal pains, mainly when she eats. States she feels the pain more at her evening meal.    STOMACH PAIN Patient states she still has some stomach pain.  Patient states the stomach pain isn't any better.  She is trying not to eat too much so it doesn't hurt then by supper time she is hungry and over eats and then her stomach pain is worse.  Patient is taking the Linzess everyday that she isn't going out.  When she doesn't take it she doesn't poop.  She isn't straining as much with the increased dose of medication.      Relevant past medical, surgical, family and social history reviewed and updated as indicated. Interim medical history since our last visit reviewed. Allergies and medications reviewed and updated.  Review of Systems  Gastrointestinal:  Positive for abdominal pain.    Per HPI unless specifically indicated above     Objective:    BP 137/79   Pulse (!) 108   Temp 97.9 F (36.6 C) (Oral)   Wt 279 lb 4.8 oz (126.7 kg)   SpO2 96%   BMI 51.08 kg/m   Wt Readings from Last 3 Encounters:  06/24/22 279 lb 4.8 oz (126.7 kg)  06/15/22 270 lb (122.5 kg)  05/25/22 278 lb 9.6 oz (126.4 kg)    Physical Exam Vitals and nursing note reviewed.  Constitutional:      General: She is not in acute distress.    Appearance: Normal appearance. She is normal weight. She is not ill-appearing, toxic-appearing or diaphoretic.  HENT:     Head: Normocephalic.     Right Ear: External ear normal.     Left Ear: External ear normal.     Nose: Nose normal.     Mouth/Throat:     Mouth: Mucous membranes are moist.      Pharynx: Oropharynx is clear.  Eyes:     General:        Right eye: No discharge.        Left eye: No discharge.     Extraocular Movements: Extraocular movements intact.     Conjunctiva/sclera: Conjunctivae normal.     Pupils: Pupils are equal, round, and reactive to light.  Cardiovascular:     Rate and Rhythm: Normal rate and regular rhythm.     Heart sounds: No murmur heard. Pulmonary:     Effort: Pulmonary effort is normal. No respiratory distress.     Breath sounds: Normal breath sounds. No wheezing or rales.  Abdominal:     General: Abdomen is flat. Bowel sounds are normal.     Palpations: Abdomen is soft.  Musculoskeletal:     Cervical back: Normal range of motion and neck supple.  Skin:    General: Skin is warm and dry.     Capillary Refill: Capillary refill takes less than 2 seconds.  Neurological:     General: No focal deficit present.     Mental Status: She is alert  and oriented to person, place, and time. Mental status is at baseline.  Psychiatric:        Mood and Affect: Mood normal.        Behavior: Behavior normal.        Thought Content: Thought content normal.        Judgment: Judgment normal.     Results for orders placed or performed in visit on 05/25/22  Comp Met (CMET)  Result Value Ref Range   Glucose 127 (H) 70 - 99 mg/dL   BUN 14 8 - 27 mg/dL   Creatinine, Ser 1.32 (H) 0.57 - 1.00 mg/dL   eGFR 43 (L) >59 mL/min/1.73   BUN/Creatinine Ratio 11 (L) 12 - 28   Sodium 140 134 - 144 mmol/L   Potassium 4.4 3.5 - 5.2 mmol/L   Chloride 101 96 - 106 mmol/L   CO2 23 20 - 29 mmol/L   Calcium 9.6 8.7 - 10.3 mg/dL   Total Protein 6.8 6.0 - 8.5 g/dL   Albumin 4.4 3.8 - 4.8 g/dL   Globulin, Total 2.4 1.5 - 4.5 g/dL   Albumin/Globulin Ratio 1.8 1.2 - 2.2   Bilirubin Total 0.5 0.0 - 1.2 mg/dL   Alkaline Phosphatase 69 44 - 121 IU/L   AST 24 0 - 40 IU/L   ALT 12 0 - 32 IU/L  Lipid Profile  Result Value Ref Range   Cholesterol, Total 216 (H) 100 - 199 mg/dL    Triglycerides 232 (H) 0 - 149 mg/dL   HDL 55 >39 mg/dL   VLDL Cholesterol Cal 40 5 - 40 mg/dL   LDL Chol Calc (NIH) 121 (H) 0 - 99 mg/dL   Chol/HDL Ratio 3.9 0.0 - 4.4 ratio  HgB A1c  Result Value Ref Range   Hgb A1c MFr Bld 7.3 (H) 4.8 - 5.6 %   Est. average glucose Bld gHb Est-mCnc 163 mg/dL  Urine Microalbumin w/creat. ratio  Result Value Ref Range   Creatinine, Urine 75.6 Not Estab. mg/dL   Microalbumin, Urine 13.4 Not Estab. ug/mL   Microalb/Creat Ratio 18 0 - 29 mg/g creat      Assessment & Plan:   Problem List Items Addressed This Visit       Other   Constipation - Primary    Chronic. Ongoing. Constipation has improved with the Linzess 157mcg dose.  However, she is still having abdominal pain that worsens in the evening.  Will change Lyrica to CR to see if pain improves.  Keep appt with GI in May.  Follow up in 2 months.  Call soon if concerns.         Follow up plan: Return in about 2 months (around 08/24/2022) for HTN, HLD, DM2 FU.

## 2022-06-24 NOTE — Telephone Encounter (Signed)
PA for Lyrica initiated and submitted via Cover My Meds. Key: Delorise Jackson

## 2022-07-07 ENCOUNTER — Telehealth: Payer: Self-pay | Admitting: Nurse Practitioner

## 2022-07-07 NOTE — Telephone Encounter (Signed)
Pt is calling in because per pt , Santiago Glad changed her medication. Pt says she is on Pregabalin ER (LYRICA CR) 165 MG TB24 [id number] and was taking the immediate release version and she wanted to try the slow release, but pt's insurance won't cover it. PT says her insurance said she had to try at least 2 of the medications before they would cover it. Pt says her insurance told her that her PCP should've received a letter explaining this. Pt would like to confirm receipt of that letter as well as have someone follow up with her regarding this. Please advise.

## 2022-07-08 NOTE — Telephone Encounter (Signed)
She has also tried gapabentin.

## 2022-07-08 NOTE — Telephone Encounter (Signed)
Looked up the denial letter in cover my meds. Patient must try 2 of the following: gabapentin, duloxetine, lidocaine 5% patches, and lyrica immediate release. Patient has tried the immediate release lyrica but must try a second option. Routing to provider to advise.

## 2022-07-09 NOTE — Telephone Encounter (Signed)
Since she has tried gabapentin and Immediate release lyrica can we appeal the decision.

## 2022-07-09 NOTE — Telephone Encounter (Signed)
Called and spoke to patient regarding medication. She states that she thinks she may have tried gabapentin before but it has been a long time ago. Patient states she is willing to try one of the options listed to see how she does with it.

## 2022-07-11 ENCOUNTER — Encounter (INDEPENDENT_AMBULATORY_CARE_PROVIDER_SITE_OTHER): Payer: Medicare PPO | Admitting: Internal Medicine

## 2022-07-11 DIAGNOSIS — G4719 Other hypersomnia: Secondary | ICD-10-CM

## 2022-07-11 DIAGNOSIS — G4733 Obstructive sleep apnea (adult) (pediatric): Secondary | ICD-10-CM

## 2022-07-16 ENCOUNTER — Other Ambulatory Visit: Payer: Self-pay | Admitting: Nurse Practitioner

## 2022-07-17 NOTE — Telephone Encounter (Signed)
Requested Prescriptions  Pending Prescriptions Disp Refills   FARXIGA 10 MG TABS tablet [Pharmacy Med Name: FARXIGA 10 MG TABLET] 90 tablet 0    Sig: TAKE 1 TABLET BY MOUTH DAILY BEFORE BREAKFAST.     Endocrinology:  Diabetes - SGLT2 Inhibitors Failed - 07/16/2022  5:22 PM      Failed - Cr in normal range and within 360 days    Creatinine, Ser  Date Value Ref Range Status  05/25/2022 1.32 (H) 0.57 - 1.00 mg/dL Final         Failed - eGFR in normal range and within 360 days    GFR calc Af Amer  Date Value Ref Range Status  05/17/2020 55 (L) >59 mL/min/1.73 Final    Comment:    **In accordance with recommendations from the NKF-ASN Task force,**   [facility] is in the process of updating its eGFR calculation to the   2021 CKD-EPI creatinine equation that estimates kidney function   without a race variable.    GFR, Estimated  Date Value Ref Range Status  01/27/2022 47 (L) >60 mL/min Final    Comment:    (NOTE) Calculated using the CKD-EPI Creatinine Equation (2021)    eGFR  Date Value Ref Range Status  05/25/2022 43 (L) >59 mL/min/1.73 Final         Passed - HBA1C is between 0 and 7.9 and within 180 days    Hemoglobin A1C  Date Value Ref Range Status  09/13/2018 8.0  Final   HB A1C (BAYER DCA - WAIVED)  Date Value Ref Range Status  12/27/2020 7.9 (H) 4.8 - 5.6 % Final    Comment:             Prediabetes: 5.7 - 6.4          Diabetes: >6.4          Glycemic control for adults with diabetes: <7.0               **Please note reference interval change**    Hgb A1c MFr Bld  Date Value Ref Range Status  05/25/2022 7.3 (H) 4.8 - 5.6 % Final    Comment:             Prediabetes: 5.7 - 6.4          Diabetes: >6.4          Glycemic control for adults with diabetes: <7.0          Passed - Valid encounter within last 6 months    Recent Outpatient Visits           3 weeks ago Constipation, unspecified constipation type   [place] [facility] Larae Grooms, NP   1 month ago Hypertension associated with diabetes [facility])   [place] [facility] Larae Grooms, NP   4 months ago Stage 3b chronic kidney disease [facility])   [place] [facility] Larae Grooms, NP   5 months ago Need for influenza vaccination   [place] [facility] Larae Grooms, NP   6 months ago Generalized abdominal pain   [place] [facility] Larae Grooms, NP       Future Appointments             In 1 month [name], MD [facility] [place]   In 1 month Larae Grooms, NP [facility]

## 2022-07-21 NOTE — Procedures (Signed)
[facility]  Polysomnogram Report Part I                                                               Phone: [phone] Fax: [phone]  Patient Name: Anna Juarez, Anna Juarez. Acquisition Number: [id number]  Date of Birth: [birth year] Acquisition Date: 07/11/2022  Referring Physician: Clydie Braun L. Caren Griffins, NP     History: The patient is a 72 year old  who was referred for re-evaluation of obstructive sleep apnea. Medical History: arthritis, CKD, diabetes, fatty liver, GERD, hyperlipidemia, hypertension, palpitations, PONV, sleep apnea and extreme obesity.  Medications: acetaminophen, benazepril, dapagliflozin propanediol, linzess, metoprolol succinate, ondansetron, pantoprazole, pregabalin, sodium flouride, tramadol and trazodone.  Procedure: This routine overnight polysomnogram was performed on the [facility] 5 using the standard diagnostic protocol. This included 6 channels of EEG, 2 channels of EOG, chin EMG, bilateral anterior tibialis EMG, nasal/oral thermistor, PTAF (nasal pressure transducer), chest and abdominal wall movements, EKG, and pulse oximetry.  Description: The total recording time was 386.4 minutes. The total sleep time was 218.5 minutes. There were a total of 124.8 minutes of wakefulness after sleep onset for a poorsleep efficiency of 56.5%. The latency to sleep onset wasprolonged at 43.1 minutes. The R sleep onset latency was N/A.  Sleep parameters, as a percentage of the total sleep time, demonstrated 31.8% of sleep was in N1 sleep, 68.2% N2, 0.0% N3 and 0.0% R sleep. There were a total of 115 arousals for an arousal index of 31.6 arousals per hour of sleep that was elevated.  Respiratory monitoring demonstrated   snoring . There were 12 apneas and hypopneas for an Apnea Hypopnea Index of 3.3 apneas and hypopneas per hour of sleep.  The Respiratory Disturbance Index, which includes 18 respiratory effort related arousals (RERAs), was 8.2 respiratory events per hour of sleep.  The  average duration of the respiratory events was 37.2 seconds with a maximum duration of 77.0 seconds. The respiratory events occurred only in the supine position. The respiratory events were associated with peripheral oxygen desaturations on the average to 90%. The lowest oxygen desaturation associated with a respiratory event was 85%. Additionally, the baseline oxygen saturation during wakefulness was 95%, during NREM sleep averaged 94%, and during REM sleep averaged N/A. The total duration of oxygen < 90% was 0.6 minutes.  Cardiac monitoring-  demonstrate transient cardiac decelerations associated with the apneas.  significant cardiac rhythm irregularities.   Periodic limb movement monitoring- did not demonstrate periodic limb movements.   Impression: This routine overnight polysomnogram did not demonstrate significant obstructive sleep apnea due to a low Apnea Hypopnea Index of 3.3 apneas and hypopneas per hour. However, it may suggest mildly increased upper airway resistance due to increased RERAs with a respiratory disturbance index of 8.2/hr.The lowest desaturation was to 85%.  As REM sleep was not observed and as the patient has been using CPAP nightly, the findings may underestimate the severity of the sleep apnea.  poor sleep efficiency with an elevated arousal index and no REM or slow wave sleep.  Recommendations:     Would recommend a repeat study after CPAP has been discontinued for a minimum of 4 nights. The use of a non-benzodiazepine sleep aid is suggested for the study. Products containing diphenhydramine such as Benadryl or "PM"  medications and medications which suppress REM sleep should be avoided.  Weight loss is recommended in a patient with a BMI of 49.4.    Yevonne Pax, MD, [facility] Diplomate ABMS-Pulmonary, Critical Care and Sleep Medicine  Electronically reviewed and digitally signed  [facility] Polysomnogram Report Part II  Phone: [phone] Fax: [phone]  Patient last name Juarez Neck Size 16.5 in. Acquisition [phone]  Patient first name Anna K. Weight 270.0 lbs. Started 07/11/2022 at 10:55:00 PM  Birth date 28-Apr-1950 Height 62.0 in. Stopped 07/12/2022 at 6:32:36 AM  Age 43 BMI 49.4 lb/in2 Duration 386.4  Study Type Adult      Report generated by Otho Perl, RPSGT  Reviewed by: Valentino Hue. Henke, PhD, ABSM, FAASM Sleep Data: Lights Out: 11:03:54 PM Sleep Onset: 11:47:00 PM  Lights On: 5:30:18 AM Sleep Effiiency: 56.5 %  Total Recording Time: 386.4 min Sleep Latency (from Lights Off) 43.1 min  Total Sleep Time (TST): 218.5 min R Latency (from Sleep Onset): N/A  Sleep Period Time: 315.5 min Total number of awakenings: 13  Wake during sleep: 97.0 min Wake After Sleep Onset (WASO): 124.8 min   Sleep Data:         Arousal Summary: Stage  Latency from lights out (min) Latency from sleep onset (min) Duration (min) % Total Sleep Time  Normal values  N 1 43.1 0.0 69.5 31.8 (5%)  N 2 64.6 21.5 149.0 68.2 (50%)  N 3       0.0 0.0 (20%)  R N/A N/A 0.0 0.0 (25%)   Number Index  Spontaneous 89 24.4  Apneas & Hypopneas 10 2.7  RERAs 18 4.9       (Apneas & Hypopneas & RERAs)  (28) (7.7)  Limb Movement 16 4.4  Snore 0 0.0  TOTAL 133 36.5     Respiratory Data:  CA OA MA Apnea Hypopnea* A+ H RERA Total  Number 0 0 1 1 11 12 18 30   Mean Dur (sec) 0.0 0.0 28.5 28.5 35.7 35.1 38.7 37.2  Max Dur (sec) 0.0 0.0 28.5 28.5 55.0 55.0 77.0 77.0  Total Dur (min) 0.0 0.0 0.5 0.5 6.5 7.0 11.6 18.6  % of TST 0.0 0.0 0.2 0.2 3.0 3.2 5.3 8.5  Index (#/h TST) 0.0 0.0 0.3 0.3 3.0 3.3 4.9 8.2  *Hypopneas scored based on 4% or greater desaturation.  Sleep Stage:        REM NREM TST  AHI N/A 3.3 3.3  RDI N/A 8.2 8.2           Body Position Data:  Sleep (min) TST (%) REM (min) NREM (min) CA (#) OA (#) MA (#) HYP (#) AHI (#/h) RERA (#) RDI (#/h) Desat (#)  Supine 127.0 58.12 0.0 127.0 0 0 1 11 5.7 18 14.2 23  Non-Supine 91.50 41.88  0.00 91.50 0.00 0.00 0.00 0.00 0.00 0 0.00 0.00  Left: 91.5 41.88 0.0 91.5 0 0 0 0 0.0 0 0.00 0     Snoring: Total number of snoring episodes  0  Total time with snoring    min (   % of sleep)   Oximetry Distribution:             WK REM NREM TOTAL  Average (%)   95    94 95  < 90% 0.1 0.0 0.5 0.6  < 80% 0.0 0.0 0.0 0.0  < 70% 0.0 0.0 0.0 0.0  # of Desaturations* 0 0 17 17  Desat Index (#/hour) 0.0    4.7 4.7  Desat Max (%) 0 0 9 9  Desat Max Dur (sec) 0.0 0.0 98.0 98.0  Approx Min O2 during sleep 85  Approx min O2 during a respiratory event 85  Was Oxygen added (Y/N) and final rate :    LPM  *Desaturations based on 3% or greater drop from baseline.   Cheyne Stokes Breathing: None Present   Heart Rate Summary:  Average Heart Rate During Sleep 56.4 bpm      Highest Heart Rate During Sleep (95th %) 255.0 bpm (artifact)  Highest Heart Rate During Sleep 255 bpm (artifact)  Highest Heart Rate During Recording (TIB) 255 bpm (artifact)   Heart Rate Observations: Event Type # Events   Bradycardia 0 Lowest HR Scored: N/A  Sinus Tachycardia During Sleep 0 Highest HR Scored: N/A  Narrow Complex Tachycardia 0 Highest HR Scored: N/A  Wide Complex Tachycardia 0 Highest HR Scored: N/A  Asystole 0 Longest Pause: N/A  Atrial Fibrillation 0 Duration Longest Event: N/A  Other Arrythmias   Type:    Periodic Limb Movement Data: (Primary legs unless otherwise noted) Total # Limb Movement 21 Limb Movement Index 5.8  Total # PLMS    PLMS Index     Total # PLMS Arousals    PLMS Arousal Index     Percentage Sleep Time with PLMS   min (   % sleep)  Mean Duration limb movements (secs)

## 2022-07-23 ENCOUNTER — Ambulatory Visit: Payer: Self-pay | Admitting: *Deleted

## 2022-07-23 ENCOUNTER — Encounter: Payer: Self-pay | Admitting: Nurse Practitioner

## 2022-07-23 ENCOUNTER — Ambulatory Visit: Payer: Medicare PPO | Admitting: Nurse Practitioner

## 2022-07-23 VITALS — BP 114/76 | HR 79 | Temp 98.3°F | Wt 276.9 lb

## 2022-07-23 DIAGNOSIS — W19XXXA Unspecified fall, initial encounter: Secondary | ICD-10-CM

## 2022-07-23 DIAGNOSIS — G8929 Other chronic pain: Secondary | ICD-10-CM

## 2022-07-23 DIAGNOSIS — K5900 Constipation, unspecified: Secondary | ICD-10-CM

## 2022-07-23 DIAGNOSIS — R519 Headache, unspecified: Secondary | ICD-10-CM | POA: Diagnosis not present

## 2022-07-23 DIAGNOSIS — M545 Low back pain, unspecified: Secondary | ICD-10-CM | POA: Diagnosis not present

## 2022-07-23 DIAGNOSIS — R42 Dizziness and giddiness: Secondary | ICD-10-CM | POA: Diagnosis not present

## 2022-07-23 LAB — URINALYSIS, ROUTINE W REFLEX MICROSCOPIC
Bilirubin, UA: NEGATIVE
Ketones, UA: NEGATIVE
Leukocytes,UA: NEGATIVE
Nitrite, UA: NEGATIVE
Protein,UA: NEGATIVE
RBC, UA: NEGATIVE
Specific Gravity, UA: <1.005 — ABNORMAL LOW (ref 1.005–1.030)
Urobilinogen, Ur: 0.2 mg/dL (ref 0.2–1.0)
pH, UA: 5 (ref 5.0–7.5)

## 2022-07-23 MED ORDER — TRAMADOL HCL 50 MG PO TABS: 50.0000 mg | ORAL_TABLET | Freq: Three times a day (TID) | ORAL | 1 refills | Status: DC | PRN

## 2022-07-23 MED ORDER — TRULANCE 3 MG PO TABS: 3.0000 mg | ORAL_TABLET | Freq: Every day | ORAL | 1 refills | Status: DC

## 2022-07-23 NOTE — Assessment & Plan Note (Signed)
Refill sent of Tramadol.  Patient has been in more pain since her fall last week.

## 2022-07-23 NOTE — Telephone Encounter (Signed)
Called and spoke to the appeals department at [facility]. Expedited appeal has been completed via the phone. Determination will be received within 72 hours.

## 2022-07-23 NOTE — Assessment & Plan Note (Signed)
Chronic. Ongoing.  Taking 3 days before patient has a bowel movement with Linzess.  Will change from Linzess to Trulance.  Side effects and benefits of medication discussed during visit.

## 2022-07-23 NOTE — Telephone Encounter (Signed)
  Chief Complaint: dizziness- new onset- patient fell last week Symptoms: dizziness with bending, turning head Frequency: 2 days- but fall was last week Pertinent Negatives: Patient denies fever, chest pain, vomiting, diarrhea, bleeding Disposition: ED /[] Urgent Care (no appt availability in office) / Appointment(In office/virtual)/  [place] [facility]/ Home Care/ Refused Recommended Disposition /[] [place] [facility]/  Follow-up with PCP Additional Notes: Appointment scheduled for evaluation. Patient advised call office if she gets worse.

## 2022-07-23 NOTE — Telephone Encounter (Signed)
Reason for Disposition  [1] MODERATE dizziness (e.g., interferes with normal activities) AND [2] has NOT been evaluated by doctor (or NP/PA) for this  (Exception: Dizziness caused by heat exposure, sudden standing, or poor fluid intake.)  Answer Assessment - Initial Assessment Questions 1. DESCRIPTION: "Describe your dizziness."     Dizzy with bending over, turning 2. LIGHTHEADED: "Do you feel lightheaded?" (e.g., somewhat faint, woozy, weak upon standing)     weak and off balance 3. VERTIGO: "Do you feel like either you or the room is spinning or tilting?" (i.e. vertigo)     no 4. SEVERITY: "How bad is it?"  "Do you feel like you are going to faint?" "Can you stand and walk?"   - MILD: Feels slightly dizzy, but walking normally.   - MODERATE: Feels unsteady when walking, but not falling; interferes with normal activities (e.g., school, work).   - SEVERE: Unable to walk without falling, or requires assistance to walk without falling; feels like passing out now.      Mild/moderate 5. ONSET:  "When did the dizziness begin?"     2 days 6. AGGRAVATING FACTORS: "Does anything make it worse?" (e.g., standing, change in head position)     Bending, changing head position 7. HEART RATE: "Can you tell me your heart rate?" "How many beats in 15 seconds?"  (Note: not all patients can do this)       BP/P has been normal 8. CAUSE: "What do you think is causing the dizziness?"     unsure 9. RECURRENT SYMPTOM: "Have you had dizziness before?" If Yes, ask: "When was the last time?" "What happened that time?"     no 10. OTHER SYMPTOMS: "Do you have any other symptoms?" (e.g., fever, chest pain, vomiting, diarrhea, bleeding)       no  Protocols used: Dizziness - Lightheadedness-A-AH

## 2022-07-23 NOTE — Progress Notes (Signed)
BP 114/76   Pulse 79   Temp 98.3 F (36.8 C) (Oral)   Wt 276 lb 14.4 oz (125.6 kg)   SpO2 98%   BMI 50.65 kg/m    Subjective:    Patient ID: Anna Juarez, female    DOB: 1951-01-11, 72 y.o.   MRN: [id number]  HPI: Anna Juarez is a 72 y.o. female  Chief Complaint  Patient presents with   Dizziness    Pt states she started having dizziness 07/13/22, states it was constant for 2 days when it started but then off and on since then    DIZZINESS Duration:  1 week On last Monday she started having bad dizziness then after that her head started having a lot of pain.  Eating did help her symptoms. Hard pain on the right side of her head. Then increases when she bends down. Description of symptoms: off kilter Duration of episode: seconds Dizziness frequency: no history of the same Provoking factors:  bending down Aggravating factors:   turning her head  Triggered by rolling over in bed: no Triggered by bending over: yes Aggravated by head movement: yes Aggravated by exertion, coughing, loud noises: yes Recent head injury: no Recent or current viral symptoms: no History of vasovagal episodes: no Nausea: yes Vomiting: no Tinnitus: no Hearing loss: no Aural fullness: no Headache: yes Photophobia/phonophobia: yes Unsteady gait: yes Postural instability: no Diplopia, dysarthria, dysphagia or weakness: no Related to exertion: no Pallor: no Diaphoresis: no Dyspnea: no Chest pain: no  Relevant past medical, surgical, family and social history reviewed and updated as indicated. Interim medical history since our last visit reviewed. Allergies and medications reviewed and updated.  Review of Systems  Eyes:  Positive for photophobia and visual disturbance.  Respiratory:  Negative for shortness of breath.   Cardiovascular:  Negative for chest pain.  Gastrointestinal:  Positive for abdominal pain.  Neurological:  Positive for dizziness and headaches.    Per HPI unless  specifically indicated above     Objective:    BP 114/76   Pulse 79   Temp 98.3 F (36.8 C) (Oral)   Wt 276 lb 14.4 oz (125.6 kg)   SpO2 98%   BMI 50.65 kg/m   Wt Readings from Last 3 Encounters:  07/23/22 276 lb 14.4 oz (125.6 kg)  06/24/22 279 lb 4.8 oz (126.7 kg)  06/15/22 270 lb (122.5 kg)    Physical Exam Vitals and nursing note reviewed.  Constitutional:      General: She is not in acute distress.    Appearance: Normal appearance. She is obese. She is not ill-appearing, toxic-appearing or diaphoretic.  HENT:     Head: Normocephalic.      Right Ear: External ear normal.     Left Ear: External ear normal.     Nose: Nose normal.     Mouth/Throat:     Mouth: Mucous membranes are moist.     Pharynx: Oropharynx is clear.  Eyes:     General: No visual field deficit.       Right eye: No discharge.        Left eye: No discharge.     Extraocular Movements: Extraocular movements intact.     Conjunctiva/sclera: Conjunctivae normal.     Pupils: Pupils are equal, round, and reactive to light.  Cardiovascular:     Rate and Rhythm: Normal rate and regular rhythm.     Heart sounds: No murmur heard. Pulmonary:     Effort: Pulmonary  effort is normal. No respiratory distress.     Breath sounds: Normal breath sounds. No wheezing or rales.  Musculoskeletal:     Cervical back: Normal range of motion and neck supple.  Skin:    General: Skin is warm and dry.     Capillary Refill: Capillary refill takes less than 2 seconds.  Neurological:     General: No focal deficit present.     Mental Status: She is alert and oriented to person, place, and time. Mental status is at baseline.     Cranial Nerves: No cranial nerve deficit.     Sensory: No sensory deficit.     Motor: No weakness.     Gait: Gait normal.  Psychiatric:        Mood and Affect: Mood normal.        Behavior: Behavior normal.        Thought Content: Thought content normal.        Judgment: Judgment normal.      Results for orders placed or performed in visit on 05/25/22  Comp Met (CMET)  Result Value Ref Range   Glucose 127 (H) 70 - 99 mg/dL   BUN 14 8 - 27 mg/dL   Creatinine, Ser 9.60 (H) 0.57 - 1.00 mg/dL   eGFR 43 (L) >45 WU/JWJ/1.91   BUN/Creatinine Ratio 11 (L) 12 - 28   Sodium 140 134 - 144 mmol/L   Potassium 4.4 3.5 - 5.2 mmol/L   Chloride 101 96 - 106 mmol/L   CO2 23 20 - 29 mmol/L   Calcium 9.6 8.7 - 10.3 mg/dL   Total Protein 6.8 6.0 - 8.5 g/dL   Albumin 4.4 3.8 - 4.8 g/dL   Globulin, Total 2.4 1.5 - 4.5 g/dL   Albumin/Globulin Ratio 1.8 1.2 - 2.2   Bilirubin Total 0.5 0.0 - 1.2 mg/dL   Alkaline Phosphatase 69 44 - 121 IU/L   AST 24 0 - 40 IU/L   ALT 12 0 - 32 IU/L  Lipid Profile  Result Value Ref Range   Cholesterol, Total 216 (H) 100 - 199 mg/dL   Triglycerides 478 (H) 0 - 149 mg/dL   HDL 55 >29 mg/dL   VLDL Cholesterol Cal 40 5 - 40 mg/dL   LDL Chol Calc (NIH) 562 (H) 0 - 99 mg/dL   Chol/HDL Ratio 3.9 0.0 - 4.4 ratio  HgB A1c  Result Value Ref Range   Hgb A1c MFr Bld 7.3 (H) 4.8 - 5.6 %   Est. average glucose Bld gHb Est-mCnc 163 mg/dL  Urine Microalbumin w/creat. ratio  Result Value Ref Range   Creatinine, Urine 75.6 Not Estab. mg/dL   Microalbumin, Urine 13.0 Not Estab. ug/mL   Microalb/Creat Ratio 18 0 - 29 mg/g creat      Assessment & Plan:   Problem List Items Addressed This Visit       Other   Back pain    Refill sent of Tramadol.  Patient has been in more pain since her fall last week.      Relevant Medications   traMADol (ULTRAM) 50 MG tablet   Constipation    Chronic. Ongoing.  Taking 3 days before patient has a bowel movement with Linzess.  Will change from Linzess to Trulance.  Side effects and benefits of medication discussed during visit.      Acute intractable headache - Primary    Headache started 1 week ago.  Associated with dizziness, visual disturbance.  Patient fell last week and  symptoms have been exacerbated.  Eyes hurt to  look at the computer.  Pain behind eyes with palpation of right temple.  Labs ordered today. Will rule out temporal arteritis.  MRI ordered for evaluation.  Will follow up once results are back.      Relevant Medications   traMADol (ULTRAM) 50 MG tablet   Other Relevant Orders   Sed Rate (ESR)   Other Visit Diagnoses     Dizziness       Relevant Orders   Comp Met (CMET)   CBC w/Diff   Urinalysis, Routine w reflex microscopic   Fall, initial encounter            Follow up plan: No follow-ups on file.

## 2022-07-23 NOTE — Assessment & Plan Note (Signed)
Headache started 1 week ago.  Associated with dizziness, visual disturbance.  Patient fell last week and symptoms have been exacerbated.  Eyes hurt to look at the computer.  Pain behind eyes with palpation of right temple.  Labs ordered today. Will rule out temporal arteritis.  MRI ordered for evaluation.  Will follow up once results are back.

## 2022-07-24 LAB — CBC WITH DIFFERENTIAL/PLATELET
Basophils Absolute: 0.1 10*3/uL (ref 0.0–0.2)
Basos: 1 %
EOS (ABSOLUTE): 0.1 10*3/uL (ref 0.0–0.4)
Eos: 1 %
Hematocrit: 45 % (ref 34.0–46.6)
Hemoglobin: 14.8 g/dL (ref 11.1–15.9)
Immature Grans (Abs): 0 10*3/uL (ref 0.0–0.1)
Immature Granulocytes: 0 %
Lymphocytes Absolute: 1.9 10*3/uL (ref 0.7–3.1)
Lymphs: 22 %
MCH: 30.5 pg (ref 26.6–33.0)
MCHC: 32.9 g/dL (ref 31.5–35.7)
MCV: 93 fL (ref 79–97)
Monocytes Absolute: 0.9 10*3/uL (ref 0.1–0.9)
Monocytes: 11 %
Neutrophils Absolute: 5.8 10*3/uL (ref 1.4–7.0)
Neutrophils: 65 %
Platelets: 181 10*3/uL (ref 150–450)
RBC: 4.85 x10E6/uL (ref 3.77–5.28)
RDW: 13 % (ref 11.7–15.4)
WBC: 8.9 10*3/uL (ref 3.4–10.8)

## 2022-07-24 LAB — COMPREHENSIVE METABOLIC PANEL
ALT: 12 IU/L (ref 0–32)
AST: 19 IU/L (ref 0–40)
Albumin/Globulin Ratio: 1.7 (ref 1.2–2.2)
Albumin: 4.5 g/dL (ref 3.8–4.8)
Alkaline Phosphatase: 80 IU/L (ref 44–121)
BUN/Creatinine Ratio: 12 (ref 12–28)
BUN: 16 mg/dL (ref 8–27)
Bilirubin Total: 0.7 mg/dL (ref 0.0–1.2)
CO2: 25 mmol/L (ref 20–29)
Calcium: 9.6 mg/dL (ref 8.7–10.3)
Chloride: 100 mmol/L (ref 96–106)
Creatinine, Ser: 1.37 mg/dL — ABNORMAL HIGH (ref 0.57–1.00)
Globulin, Total: 2.7 g/dL (ref 1.5–4.5)
Glucose: 115 mg/dL — ABNORMAL HIGH (ref 70–99)
Potassium: 4.4 mmol/L (ref 3.5–5.2)
Sodium: 141 mmol/L (ref 134–144)
Total Protein: 7.2 g/dL (ref 6.0–8.5)
eGFR: 41 mL/min/{1.73_m2} — ABNORMAL LOW (ref >59)

## 2022-07-24 LAB — SEDIMENTATION RATE: Sed Rate: 28 mm/hr (ref 0–40)

## 2022-07-24 NOTE — Telephone Encounter (Signed)
Received fax stating that appeal has been approved. Called and notified patient of approval.

## 2022-07-24 NOTE — Progress Notes (Signed)
HI Anna Juarez.  Your lab work looks good.  No reason for your symptoms.  The inflammatory marker I checked was normal. We will proceed with the MRI.

## 2022-07-27 ENCOUNTER — Telehealth: Payer: Self-pay

## 2022-07-27 NOTE — Telephone Encounter (Signed)
PA for Trulance submitted and approved via Cover My Meds. Key: [id number]  Patient notified of approval.

## 2022-08-04 DIAGNOSIS — I10 Essential (primary) hypertension: Secondary | ICD-10-CM | POA: Diagnosis not present

## 2022-08-04 DIAGNOSIS — R600 Localized edema: Secondary | ICD-10-CM | POA: Diagnosis not present

## 2022-08-04 DIAGNOSIS — N1832 Chronic kidney disease, stage 3b: Secondary | ICD-10-CM | POA: Diagnosis not present

## 2022-08-04 DIAGNOSIS — E1122 Type 2 diabetes mellitus with diabetic chronic kidney disease: Secondary | ICD-10-CM | POA: Diagnosis not present

## 2022-08-06 ENCOUNTER — Telehealth: Payer: Self-pay | Admitting: Nurse Practitioner

## 2022-08-06 DIAGNOSIS — R519 Headache, unspecified: Secondary | ICD-10-CM

## 2022-08-06 DIAGNOSIS — R42 Dizziness and giddiness: Secondary | ICD-10-CM

## 2022-08-06 NOTE — Telephone Encounter (Signed)
Copied from CRM [id number]. Topic: General - Inquiry >> Aug 06, 2022 11:26 AM Patsy Lager T wrote: Reason for CRM: patient is calling to f/u on when she will get the MRI as her symptoms are the same if not getting worse. When she went to Kidney doctor he would not let her leave per her BP was low and she was dizzy. Please advise.

## 2022-08-06 NOTE — Telephone Encounter (Signed)
MRI has been ordered. We are waiting for authorization from her insurance company.

## 2022-08-06 NOTE — Telephone Encounter (Signed)
Called and notified patient of Karen's message.  

## 2022-08-07 ENCOUNTER — Other Ambulatory Visit: Payer: Self-pay

## 2022-08-07 DIAGNOSIS — D472 Monoclonal gammopathy: Secondary | ICD-10-CM

## 2022-08-10 ENCOUNTER — Inpatient Hospital Stay: Payer: Medicare PPO | Attending: Internal Medicine

## 2022-08-10 ENCOUNTER — Inpatient Hospital Stay (HOSPITAL_BASED_OUTPATIENT_CLINIC_OR_DEPARTMENT_OTHER): Payer: Medicare PPO | Admitting: Internal Medicine

## 2022-08-10 ENCOUNTER — Encounter: Payer: Self-pay | Admitting: Internal Medicine

## 2022-08-10 VITALS — BP 106/76 | HR 69 | Temp 96.4°F | Ht 62.0 in | Wt 283.8 lb

## 2022-08-10 DIAGNOSIS — I129 Hypertensive chronic kidney disease with stage 1 through stage 4 chronic kidney disease, or unspecified chronic kidney disease: Secondary | ICD-10-CM | POA: Diagnosis not present

## 2022-08-10 DIAGNOSIS — D472 Monoclonal gammopathy: Secondary | ICD-10-CM | POA: Insufficient documentation

## 2022-08-10 DIAGNOSIS — E1122 Type 2 diabetes mellitus with diabetic chronic kidney disease: Secondary | ICD-10-CM | POA: Diagnosis not present

## 2022-08-10 DIAGNOSIS — N1832 Chronic kidney disease, stage 3b: Secondary | ICD-10-CM | POA: Diagnosis not present

## 2022-08-10 DIAGNOSIS — M1990 Unspecified osteoarthritis, unspecified site: Secondary | ICD-10-CM | POA: Diagnosis not present

## 2022-08-10 LAB — CMP (CANCER CENTER ONLY)
ALT: 11 U/L (ref 0–44)
AST: 22 U/L (ref 15–41)
Albumin: 4.1 g/dL (ref 3.5–5.0)
Alkaline Phosphatase: 64 U/L (ref 38–126)
Anion gap: 9 (ref 5–15)
BUN: 17 mg/dL (ref 8–23)
CO2: 26 mmol/L (ref 22–32)
Calcium: 8.9 mg/dL (ref 8.9–10.3)
Chloride: 102 mmol/L (ref 98–111)
Creatinine: 1.35 mg/dL — ABNORMAL HIGH (ref 0.44–1.00)
GFR, Estimated: 41 mL/min — ABNORMAL LOW (ref >60)
Glucose, Bld: 141 mg/dL — ABNORMAL HIGH (ref 70–99)
Potassium: 4 mmol/L (ref 3.5–5.1)
Sodium: 137 mmol/L (ref 135–145)
Total Bilirubin: 0.7 mg/dL (ref 0.3–1.2)
Total Protein: 7.3 g/dL (ref 6.5–8.1)

## 2022-08-10 LAB — CBC WITH DIFFERENTIAL (CANCER CENTER ONLY)
Abs Immature Granulocytes: 0.03 10*3/uL (ref 0.00–0.07)
Basophils Absolute: 0.1 10*3/uL (ref 0.0–0.1)
Basophils Relative: 1 %
Eosinophils Absolute: 0.1 10*3/uL (ref 0.0–0.5)
Eosinophils Relative: 1 %
HCT: 45.4 % (ref 36.0–46.0)
Hemoglobin: 14.6 g/dL (ref 12.0–15.0)
Immature Granulocytes: 1 %
Lymphocytes Relative: 28 %
Lymphs Abs: 1.8 10*3/uL (ref 0.7–4.0)
MCH: 30.4 pg (ref 26.0–34.0)
MCHC: 32.2 g/dL (ref 30.0–36.0)
MCV: 94.4 fL (ref 80.0–100.0)
Monocytes Absolute: 0.6 10*3/uL (ref 0.1–1.0)
Monocytes Relative: 9 %
Neutro Abs: 3.8 10*3/uL (ref 1.7–7.7)
Neutrophils Relative %: 60 %
Platelet Count: 183 10*3/uL (ref 150–400)
RBC: 4.81 MIL/uL (ref 3.87–5.11)
RDW: 12.9 % (ref 11.5–15.5)
WBC Count: 6.3 10*3/uL (ref 4.0–10.5)
nRBC: 0 % (ref 0.0–0.2)

## 2022-08-10 NOTE — Assessment & Plan Note (Addendum)
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 CBC-hemoglobin normal; normal calcium.  I reviewed the natural history of MGUS; low risk of transformation to multiple myeloma.MM Labs pending today.   # Stage 3b chronic kidney disease- GFR 42- stable. [Dr.[name]]   # Poorly controlled DM- HbA1c- 7.4-recommend close monitoring of blood sugars/compliance with medications. Stable.   # Hip pain- awaiting hip MRI w/o contrast [PCP]-10/26-MRI hip shows no evidence or concerns of myelomatous involvement; more consistent with osteoarthritis. Stable.   mychart- will call  # DISPOSITION: # follow up in 6 months- MD; labs- cbc/cmp;MM panel; K/l light chains-Dr.B

## 2022-08-10 NOTE — Progress Notes (Signed)
No concerns today 

## 2022-08-10 NOTE — Progress Notes (Signed)
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  Ambulating in motorized wheel chair.   Bernadene Dimple Nanas 72 y.o.  female history of monoclonal gammopathy of unknown significance is here for follow-up.  Patient complains of chronic worsening fatigue. Has CPAP; awaiting repeat sleep study.   Patient continues to have chronic back pain joint pains. Denies any tingling or numbness.  Denies any weight loss.  Patient also has chronic abdominal pain not any worse.    Review of Systems  Constitutional:  Positive for malaise/fatigue. Negative for chills, diaphoresis, fever and weight loss.  HENT:  Negative for nosebleeds and sore throat.   Eyes:  Negative for double vision.  Respiratory:  Negative for cough, hemoptysis, sputum production, shortness of breath and wheezing.   Cardiovascular:  Negative for chest pain, palpitations, orthopnea and leg swelling.  Gastrointestinal:  Negative for blood in stool, constipation, diarrhea, heartburn, melena, nausea and vomiting.  Genitourinary:  Negative for dysuria, frequency and urgency.  Musculoskeletal:  Positive for back pain and joint pain.  Skin: Negative.  Negative for itching and rash.  Neurological:  Negative for dizziness, tingling, focal weakness, weakness and headaches.  Endo/Heme/Allergies:  Does not bruise/bleed easily.  Psychiatric/Behavioral:  Negative for depression. The patient is not nervous/anxious and does not have insomnia.      MEDICAL HISTORY:  Past Medical History:  Diagnosis Date   Arthritis    Chronic kidney disease     Complication of anesthesia    Diabetes mellitus without complication (HCC)    Extreme obesity    Fatty liver    GERD (gastroesophageal reflux disease)    Hyperlipidemia    Hypertension    Palpitations    PONV (postoperative nausea and vomiting)    Radiculopathy, cervical    Renal insufficiency    stage 3   Sleep apnea    uses cpap   Tear of acetabular labrum 01/2021   right    SURGICAL HISTORY: Past Surgical History:  Procedure Laterality Date   ABDOMINAL HYSTERECTOMY     CATARACT EXTRACTION W/PHACO Right 06/02/2021   Procedure: CATARACT EXTRACTION PHACO AND INTRAOCULAR LENS PLACEMENT (IOC) RIGHT DIABETIC;  Surgeon: Nevada Crane, MD;  Location: [facility];  Service: Ophthalmology;  Laterality: Right;  2.52 0:22.8   CATARACT EXTRACTION W/PHACO Left 06/16/2021   Procedure: CATARACT EXTRACTION PHACO AND INTRAOCULAR LENS PLACEMENT (IOC) LEFT DIABETIC 3.51 00:30.9;  Surgeon: Nevada Crane, MD;  Location: [facility];  Service: Ophthalmology;  Laterality: Left;   CERVICAL FUSION     x2   CHOLECYSTECTOMY     COLONOSCOPY     COLONOSCOPY WITH PROPOFOL N/A 09/09/2021   Procedure: COLONOSCOPY WITH PROPOFOL;  Surgeon: [name], MD;  Location: [facility];  Service: Endoscopy;  Laterality: N/A;   ESOPHAGOGASTRODUODENOSCOPY (EGD) WITH PROPOFOL N/A 09/09/2021   Procedure: ESOPHAGOGASTRODUODENOSCOPY (EGD) WITH PROPOFOL;  Surgeon: [name], MD;  Location: [facility] ENDOSCOPY;  Service: Endoscopy;  Laterality: N/A;   JOINT REPLACEMENT Bilateral    knees    SOCIAL HISTORY: Social History   Socioeconomic History   Marital status: Married    Spouse name: Not on file   Number of children: Not on  file   Years of education: Not on file   Highest education level: 12th grade  Occupational History   Occupation: retired   Tobacco Use   Smoking status: Never    Passive exposure: Never   Smokeless tobacco: Never  Vaping Use   Vaping Use: Never used  Substance and  Sexual Activity   Alcohol use: No   Drug use: No   Sexual activity: Not Currently  Other Topics Concern   Not on file  Social History Narrative   [place]; lives with husband; never smoker; no alcohol; retd. Manufacturing.    Social Determinants of Health   Financial Resource Strain: Low Risk  (09/29/2021)   Overall Financial Resource Strain (CARDIA)    Difficulty of Paying Living Expenses: Not hard at all  Food Insecurity: No Food Insecurity (09/29/2021)   Hunger Vital Sign    Worried About Running Out of Food in the Last Year: Never true    Ran Out of Food in the Last Year: Never true  Transportation Needs: No Transportation Needs (09/29/2021)   PRAPARE - Administrator, Civil Service (Medical): No    Lack of Transportation (Non-Medical): No  Physical Activity: Insufficiently Active (09/29/2021)   Exercise Vital Sign    Days of Exercise per Week: 2 days    Minutes of Exercise per Session: 10 min  Stress: No Stress Concern Present (09/29/2021)   [employer] of Occupational Health - Occupational Stress Questionnaire    Feeling of Stress : Only a little  Social Connections: Moderately Isolated (09/29/2021)   Social Connection and Isolation Panel [NHANES]    Frequency of Communication with Friends and Family: More than three times a week    Frequency of Social Gatherings with Friends and Family: Once a week    Attends Religious Services: More than 4 times per year    Active Member of [employer] or Organizations: No    Attends Banker Meetings: Never    Marital Status: Widowed  Intimate Partner Violence: Not At Risk (09/29/2021)   Humiliation, Afraid, Rape, and Kick questionnaire    Fear of Current or Ex-Partner: No    Emotionally Abused: No    Physically Abused: No    Sexually Abused: No    FAMILY HISTORY: Family History  Problem Relation Age of Onset   Diabetes Mother    Heart disease Mother    Hypertension Mother    Heart disease  Father    Cancer Brother    Hypertension Brother    Breast cancer Maternal Aunt    Breast cancer Paternal Aunt     ALLERGIES:  is allergic to actos [pioglitazone], metformin and related, scopolamine, and victoza [liraglutide].  MEDICATIONS:  Current Outpatient Medications  Medication Sig Dispense Refill   ACCU-CHEK GUIDE test strip USE AS INSTRUCTED 100 strip 0   acetaminophen (TYLENOL) 500 MG tablet Take 2 tablets (1,000 mg total) by mouth every 6 (six) hours as needed for mild pain.     benazepril (LOTENSIN) 40 MG tablet Take 1 tablet (40 mg total) by mouth daily. 90 tablet 1   dapagliflozin propanediol (FARXIGA) 10 MG TABS tablet TAKE 1 TABLET BY MOUTH DAILY BEFORE BREAKFAST. 90 tablet 0   metoprolol succinate (TOPROL-XL) 50 MG 24 hr tablet Take 1 tablet (50 mg total) by mouth daily. Take with or immediately following a meal. 90 tablet 1   ondansetron (ZOFRAN) 4 MG tablet TAKE 1 TABLET BY MOUTH EVERY 6 HOURS. 18 tablet 1  pantoprazole (PROTONIX) 40 MG tablet Take 1 tablet (40 mg total) by mouth 2 (two) times daily before a meal. 180 tablet 1   Plecanatide (TRULANCE) 3 MG TABS Take 1 tablet (3 mg total) by mouth daily. 90 tablet 1   pregabalin (LYRICA) 150 MG capsule Take 150 mg by mouth 3 (three) times daily.     traMADol (ULTRAM) 50 MG tablet Take 1 tablet (50 mg total) by mouth every 8 (eight) hours as needed. 60 tablet 1   traZODone (DESYREL) 100 MG tablet Take 1 tablet (100 mg total) by mouth at bedtime as needed. for sleep 90 tablet 1   SODIUM FLUORIDE 5000 PPM 1.1 % PSTE See admin instructions. (Patient not taking: Reported on 08/10/2022)     No current facility-administered medications for this visit.      Marland Kitchen  PHYSICAL EXAMINATION:  Vitals:   08/10/22 1500  BP: 106/76  Pulse: 69  Temp: (!) 96.4 F (35.8 C)  SpO2: 99%   Filed Weights   08/10/22 1500  Weight: 283 lb 12.8 oz (128.7 kg)    Physical Exam Vitals and nursing note reviewed.  HENT:     Head:  Normocephalic and atraumatic.     Mouth/Throat:     Pharynx: Oropharynx is clear.  Eyes:     Extraocular Movements: Extraocular movements intact.     Pupils: Pupils are equal, round, and reactive to light.  Cardiovascular:     Rate and Rhythm: Normal rate and regular rhythm.  Pulmonary:     Comments: Decreased breath sounds bilaterally.  Abdominal:     Palpations: Abdomen is soft.  Musculoskeletal:        General: Normal range of motion.     Cervical back: Normal range of motion.  Skin:    General: Skin is warm.  Neurological:     General: No focal deficit present.     Mental Status: She is alert and oriented to person, place, and time.  Psychiatric:        Behavior: Behavior normal.        Judgment: Judgment normal.      LABORATORY DATA:  I have reviewed the data as listed Lab Results  Component Value Date   WBC 6.3 08/10/2022   HGB 14.6 08/10/2022   HCT 45.4 08/10/2022   MCV 94.4 08/10/2022   PLT 183 08/10/2022   Recent Labs    09/16/21 1119 10/16/21 1122 01/27/22 1454 02/19/22 1403 05/25/22 1412 07/23/22 1412 08/10/22 1452  NA 140   < > 138   < > 140 141 137  K 3.9   < > 3.9   < > 4.4 4.4 4.0  CL 101   < > 107   < > 101 100 102  CO2 28   < > 24   < > 23 25 26   GLUCOSE 182*   < > 138*   < > 127* 115* 141*  BUN 12   < > 14   < > 14 16 17   CREATININE 1.31*   < > 1.21*   < > 1.32* 1.37* 1.35*  CALCIUM 9.1   < > 8.9   < > 9.6 9.6 8.9  GFRNONAA 43*  --  47*  --   --   --  41*  PROT  --    < > 7.6   < > 6.8 7.2 7.3  ALBUMIN  --    < > 4.1   < > 4.4 4.5 4.1  AST  --    < >  23   < > 24 19 22   ALT  --    < > 13   < > 12 12 11   ALKPHOS  --    < > 54   < > 69 80 64  BILITOT  --    < > 0.7   < > 0.5 0.7 0.7   < > = values in this interval not displayed.    RADIOGRAPHIC STUDIES: I have personally reviewed the radiological images as listed and agreed with the findings in the report. No results found.  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 CBC-hemoglobin normal; normal calcium.  I reviewed the natural history of MGUS; low risk of transformation to multiple myeloma.MM Labs pending today.   # Stage 3b chronic kidney disease- GFR 42- stable. [Dr.[name]]   # Poorly controlled DM- HbA1c- 7.4-recommend close monitoring of blood sugars/compliance with medications. Stable.   # Hip pain- awaiting hip MRI w/o contrast [PCP]-10/26-MRI hip shows no evidence or concerns of myelomatous involvement; more consistent with osteoarthritis. Stable.   mychart-  # DISPOSITION: # follow up in 6 months- MD; labs- cbc/cmp;MM panel; K/l light chains-Dr.B   All questions were answered. The patient knows to call the clinic with any problems, questions or concerns.    Earna Coder, MD 08/10/2022 3:38 PM

## 2022-08-11 DIAGNOSIS — H43813 Vitreous degeneration, bilateral: Secondary | ICD-10-CM | POA: Diagnosis not present

## 2022-08-11 DIAGNOSIS — E119 Type 2 diabetes mellitus without complications: Secondary | ICD-10-CM | POA: Diagnosis not present

## 2022-08-11 DIAGNOSIS — H26493 Other secondary cataract, bilateral: Secondary | ICD-10-CM | POA: Diagnosis not present

## 2022-08-11 DIAGNOSIS — Z0100 Encounter for examination of eyes and vision without abnormal findings: Secondary | ICD-10-CM | POA: Diagnosis not present

## 2022-08-11 DIAGNOSIS — Z961 Presence of intraocular lens: Secondary | ICD-10-CM | POA: Diagnosis not present

## 2022-08-11 LAB — KAPPA/LAMBDA LIGHT CHAINS
Kappa free light chain: 22.2 mg/L — ABNORMAL HIGH (ref 3.3–19.4)
Kappa, lambda light chain ratio: 0.15 — ABNORMAL LOW (ref 0.26–1.65)
Lambda free light chains: 148.7 mg/L — ABNORMAL HIGH (ref 5.7–26.3)

## 2022-08-11 LAB — HM DIABETES EYE EXAM

## 2022-08-13 ENCOUNTER — Encounter: Payer: Self-pay | Admitting: Nurse Practitioner

## 2022-08-13 ENCOUNTER — Ambulatory Visit
Admission: RE | Admit: 2022-08-13 | Discharge: 2022-08-13 | Disposition: A | Discharge status: Home / Self Care | Payer: Medicare PPO | Source: Ambulatory Visit | Attending: Nurse Practitioner | Admitting: Nurse Practitioner

## 2022-08-13 DIAGNOSIS — R42 Dizziness and giddiness: Secondary | ICD-10-CM | POA: Diagnosis not present

## 2022-08-13 DIAGNOSIS — R519 Headache, unspecified: Secondary | ICD-10-CM | POA: Insufficient documentation

## 2022-08-13 NOTE — Progress Notes (Signed)
Please let patient know that overall her MRI was unremarkable.  She has some chronic changes indicating some small vessel damage which is consistent with aging.  There was some mild mucosal thickening noted.  This could indicate some sinusitis and may explain her symptoms.  If should would like we could treat this with some antibiotics to see if the symptoms improve.

## 2022-08-14 LAB — MULTIPLE MYELOMA PANEL, SERUM
Albumin SerPl Elph-Mcnc: 3.8 g/dL (ref 2.9–4.4)
Albumin/Glob SerPl: 1.2 (ref 0.7–1.7)
Alpha 1: 0.2 g/dL (ref 0.0–0.4)
Alpha2 Glob SerPl Elph-Mcnc: 0.9 g/dL (ref 0.4–1.0)
B-Globulin SerPl Elph-Mcnc: 1 g/dL (ref 0.7–1.3)
Gamma Glob SerPl Elph-Mcnc: 1.1 g/dL (ref 0.4–1.8)
Globulin, Total: 3.2 g/dL (ref 2.2–3.9)
IgA: 433 mg/dL — ABNORMAL HIGH (ref 64–422)
IgG (Immunoglobin G), Serum: 887 mg/dL (ref 586–1602)
IgM (Immunoglobulin M), Srm: 57 mg/dL (ref 26–217)
M Protein SerPl Elph-Mcnc: 0.2 g/dL — ABNORMAL HIGH
Total Protein ELP: 7 g/dL (ref 6.0–8.5)

## 2022-08-17 ENCOUNTER — Ambulatory Visit: Payer: Self-pay | Admitting: *Deleted

## 2022-08-17 MED ORDER — DOXYCYCLINE HYCLATE 100 MG PO TABS: 100.0000 mg | ORAL_TABLET | Freq: Two times a day (BID) | ORAL | 0 refills | Status: DC

## 2022-08-17 MED ORDER — LINACLOTIDE 145 MCG PO CAPS: 145.0000 ug | ORAL_CAPSULE | Freq: Every day | ORAL | 0 refills | Status: DC

## 2022-08-17 NOTE — Telephone Encounter (Signed)
Summary: medication not working   Medication: Plecanatide (TRULANCE) 3 MG TABS  Patient has been on medication  for 3 wks.  Patient is having bowel movements every 5 to 7 days ...  Patient is not feeling well          Chief Complaint: requesting medication antibiotics for sinus issues per message from PCP 08/13/22 regarding MRI Symptoms: sinus pain, abdominal bloating persists. Constipation x 5-7 days and had to take "some liquid" that "tastes awful" to have BM. Unknown name of OTC medication possibly mag citrate. Reports trulance not working. Linzess works better, per patient. Appetite poor  Frequency: 2-3 weeks Pertinent Negatives: Patient denies fever no vomiting  Disposition: [] ED /[] Urgent Care (no appt availability in office) / [] Appointment(In office/virtual)/ []  [place] [facility]/ [] Home Care/ [] Refused Recommended Disposition /[] [place] [facility]/ [x]  Follow-up with PCP Additional Notes:   Please advise if antibiotic offered from 08/13/22 message from PCP can be prescribed. Please advise regarding medication trulance. Please advise if appt needed. Patient feels she has been seen and everything tried she does not feel appt needed. Please advise      Reason for Disposition  Abdomen BLOATING is a chronic symptom (recurrent or ongoing AND present > 4 weeks)  Answer Assessment - Initial Assessment Questions 1. SYMPTOM: "What's the main symptom you're concerned about?" (e.g., abdomen bloating, swelling)     Abdominal bloating  2. ONSET: "When did bloating   start?"     2-3 weeks  3. SEVERITY: "How bad is the bloating or swelling?"    - BLOATING: Feels gassy or bloated. No visible swelling.     - MILD SWELLING: Feels gassy or bloated. Abdomen looks mildly distended or swollen.    - MODERATE - SEVERE SWELLING: Abdomen looks very distended or swollen.      Mild swelling  4. ABDOMEN PAIN:  "Is there any abdomen pain?" If Yes, ask: "How bad is the pain?"  (e.g., Scale 1-10;  mild, moderate, or severe)   - NONE (0): No pain.   - MILD (1-3): Doesn't interfere with normal activities, abdomen soft and not tender to touch.    - MODERATE (4-7): Interferes with normal activities or awakens from sleep, abdomen tender to touch.    - SEVERE (8-10): Excruciating pain, doubled over, unable to do any normal activities.       Wakes up from sleep  5. RELIEVING AND AGGRAVATING FACTORS: "What makes it better or worse?" (e.g., certain foods, lactose, medicines)     Took something  6. GI HISTORY: "Do you have any history of stomach or intestine problems?" (e.g., bowel obstruction, cancer, irritable bowel)      IBS 7. CAUSE: "What do you think is causing the bloating?"      Not sure  8. OTHER SYMPTOMS: "Do you have any other symptoms?" (e.g., belching, blood in stool, breathing difficulty, constipation, diarrhea, fever, passing gas, vomiting, weight loss, white of eyes have turned yellow)     Abdominal bloating , constipation, sinus pain 9. PREGNANCY: "Is there any chance you are pregnant?" "When was your last menstrual period?"     na  Protocols used: Abdomen Bloating and Swelling-A-AH

## 2022-08-17 NOTE — Telephone Encounter (Signed)
I sent in the antibiotic.  Ask her if she is okay with changing from Trulance back to Linzess?  Ask her if she was able to get in to see the GI doctor?

## 2022-08-17 NOTE — Telephone Encounter (Signed)
Updated patient's medication list to change from trulance back to linzess.

## 2022-08-17 NOTE — Telephone Encounter (Signed)
Called and spoke with patient. Let her know that Anna Juarez sent in an antibiotic for her. Also discussed the Linzess and Trulance, patient states she is ok with going back to Linzess. States she has plenty at home and does not need a new prescription right now. Also states that she has an appointment with GI this Thursday, 08/10/22.

## 2022-08-17 NOTE — Telephone Encounter (Signed)
Routing to provider to advise. Patient is requesting an antibiotic to be called in from recent results. Also states that trulance is not working for her, states Linzess was working better.

## 2022-08-19 ENCOUNTER — Other Ambulatory Visit: Payer: Self-pay | Admitting: Nurse Practitioner

## 2022-08-19 DIAGNOSIS — E119 Type 2 diabetes mellitus without complications: Secondary | ICD-10-CM

## 2022-08-19 NOTE — Telephone Encounter (Signed)
Requested Prescriptions  Pending Prescriptions Disp Refills   ACCU-CHEK GUIDE test strip [Pharmacy Med Name: ACCU-CHEK GUIDE TEST STRIP] 100 strip 0    Sig: USE AS INSTRUCTED     Endocrinology: Diabetes - Testing Supplies Passed - 08/19/2022  2:29 AM      Passed - Valid encounter within last 12 months    Recent Outpatient Visits           3 weeks ago Acute intractable headache, unspecified headache type   [place] [facility] Larae Grooms, NP   1 month ago Constipation, unspecified constipation type   [place] [facility] Larae Grooms, NP   2 months ago Hypertension associated with diabetes [facility])   [place] [facility] Larae Grooms, NP   6 months ago Stage 3b chronic kidney disease [facility])   [place] [facility] Larae Grooms, NP   7 months ago Need for influenza vaccination   [place] [facility] Larae Grooms, NP       Future Appointments             Tomorrow [name], MD [facility] [place]   In 5 days Larae Grooms, NP [facility]

## 2022-08-20 ENCOUNTER — Ambulatory Visit: Payer: Medicare PPO | Admitting: Gastroenterology

## 2022-08-20 ENCOUNTER — Encounter: Payer: Self-pay | Admitting: Gastroenterology

## 2022-08-20 VITALS — BP 123/82 | HR 89 | Temp 98.4°F | Wt 282.0 lb

## 2022-08-20 DIAGNOSIS — R1012 Left upper quadrant pain: Secondary | ICD-10-CM | POA: Diagnosis not present

## 2022-08-20 DIAGNOSIS — R109 Unspecified abdominal pain: Secondary | ICD-10-CM | POA: Diagnosis not present

## 2022-08-20 DIAGNOSIS — K7460 Unspecified cirrhosis of liver: Secondary | ICD-10-CM | POA: Diagnosis not present

## 2022-08-20 DIAGNOSIS — G8929 Other chronic pain: Secondary | ICD-10-CM

## 2022-08-20 NOTE — Progress Notes (Signed)
Primary Care Physician: Larae Grooms, NP  Primary Gastroenterologist:  Dr. [name]  Chief Complaint  Patient presents with   Abdominal Pain    LUQ stabbing pain    HPI: Anna Juarez is a 72 y.o. female here with chronic abdominal pain.  The patient also reports that she has this pain after she eats.  The pain is on the left side and starts in the front of her left side and wraps around to her back.  She states that if it lasts more than hours she will know that it will last 3 to 4 hours but most the time it abates at about an hour.  She also states that it is not associate with movement.  She also denies any unexplained weight loss.  She has had a CT scan and upper endoscopy and was tried on Bentyl with no relief.  The patient is very frustrated with this pain and reports it to be a stabbing-like pain.   Past Medical History:  Diagnosis Date   Arthritis    Chronic kidney disease    Complication of anesthesia    Diabetes mellitus without complication (HCC)    Extreme obesity    Fatty liver    GERD (gastroesophageal reflux disease)    Hyperlipidemia    Hypertension    Palpitations    PONV (postoperative nausea and vomiting)    Radiculopathy, cervical    Renal insufficiency    stage 3   Sleep apnea    uses cpap   Tear of acetabular labrum 01/2021   right    Current Outpatient Medications  Medication Sig Dispense Refill   ACCU-CHEK GUIDE test strip USE AS INSTRUCTED 100 strip 0   acetaminophen (TYLENOL) 500 MG tablet Take 2 tablets (1,000 mg total) by mouth every 6 (six) hours as needed for mild pain.     benazepril (LOTENSIN) 40 MG tablet Take 1 tablet (40 mg total) by mouth daily. 90 tablet 1   dapagliflozin propanediol (FARXIGA) 10 MG TABS tablet TAKE 1 TABLET BY MOUTH DAILY BEFORE BREAKFAST. 90 tablet 0   doxycycline (VIBRA-TABS) 100 MG tablet Take 1 tablet (100 mg total) by mouth 2 (two) times daily. 20 tablet 0   linaclotide (LINZESS) 145 MCG CAPS capsule  Take 1 capsule (145 mcg total) by mouth daily before breakfast. 30 capsule 0   metoprolol succinate (TOPROL-XL) 50 MG 24 hr tablet Take 1 tablet (50 mg total) by mouth daily. Take with or immediately following a meal. 90 tablet 1   ondansetron (ZOFRAN) 4 MG tablet TAKE 1 TABLET BY MOUTH EVERY 6 HOURS. 18 tablet 1   pantoprazole (PROTONIX) 40 MG tablet Take 1 tablet (40 mg total) by mouth 2 (two) times daily before a meal. 180 tablet 1   pregabalin (LYRICA) 150 MG capsule Take 150 mg by mouth 3 (three) times daily.     SODIUM FLUORIDE 5000 PPM 1.1 % PSTE See admin instructions.     traMADol (ULTRAM) 50 MG tablet Take 1 tablet (50 mg total) by mouth every 8 (eight) hours as needed. 60 tablet 1   traZODone (DESYREL) 100 MG tablet Take 1 tablet (100 mg total) by mouth at bedtime as needed. for sleep 90 tablet 1   No current facility-administered medications for this visit.    Allergies as of 08/20/2022 - Review Complete 08/20/2022  Allergen Reaction Noted   Actos [pioglitazone] Nausea And Vomiting 10/02/2014   Metformin and related Diarrhea 10/02/2014   Scopolamine Nausea  And Vomiting 01/31/2021   Victoza [liraglutide] Nausea And Vomiting 10/02/2014    ROS:  General: Negative for anorexia, weight loss, fever, chills, fatigue, weakness. ENT: Negative for hoarseness, difficulty swallowing , nasal congestion. CV: Negative for chest pain, angina, palpitations, dyspnea on exertion, peripheral edema.  Respiratory: Negative for dyspnea at rest, dyspnea on exertion, cough, sputum, wheezing.  GI: See history of present illness. GU:  Negative for dysuria, hematuria, urinary incontinence, urinary frequency, nocturnal urination.  Endo: Negative for unusual weight change.    Physical Examination:   There were no vitals taken for this visit.  General: Well-nourished, well-developed in no acute distress.  Eyes: No icterus. Conjunctivae pink. Lungs: Clear to auscultation bilaterally.  Non-labored. Heart: Regular rate and rhythm, no murmurs rubs or gallops.  Abdomen: Bowel sounds are normal, nontender, nondistended, no hepatosplenomegaly or masses, no abdominal bruits or hernia , no rebound or guarding.   Extremities: No lower extremity edema. No clubbing or deformities. Neuro: Alert and oriented x 3.  Grossly intact. Skin: Warm and dry, no jaundice.   Psych: Alert and cooperative, normal mood and affect.  Labs:    Imaging Studies: MR Brain Wo Contrast  Result Date: 08/13/2022 CLINICAL DATA:  Provided history: Acute intractable headache, unspecified headache type. Headache, tension-type. Dizziness. EXAM: MRI HEAD WITHOUT CONTRAST TECHNIQUE: Multiplanar, multiecho pulse sequences of the brain and surrounding structures were obtained without intravenous contrast. COMPARISON:  Head CT 12/07/2017. Report from brain MRI 11/11/1995 (images unavailable). FINDINGS: Brain: No age advanced or lobar predominant parenchymal atrophy. Moderate multifocal T2 FLAIR hyperintense signal abnormality within the bilateral cerebral white matter, nonspecific but most often secondary to chronic small vessel ischemia. There is no acute infarct. No evidence of an intracranial mass No extra-axial fluid collection. No midline shift. Vascular: Maintained flow voids within the proximal large arterial vessels. Skull and upper cervical spine: No focal suspicious marrow lesion. Susceptibility artifact arising from ACDF hardware. Sinuses/Orbits: No mass or acute finding within the imaged orbits. Moderate mucosal thickening within the left sphenoid sinus. IMPRESSION: 1.  No evidence of an acute intracranial abnormality. 2. Moderate multifocal T2 FLAIR hyperintense signal abnormality within the cerebral white matter, nonspecific but most often secondary to chronic small vessel ischemia. 3. Moderate left sphenoid sinus mucosal thickening. Electronically Signed   By: Jackey Loge D.O.   On: 08/13/2022 09:35    Assessment  and Plan:   Anna Juarez is a 72 y.o. y/o female who comes here for follow-up of abdominal pain.  The patient has had this abdominal pain for some time and is very frustrated.  The patient has the pain after eating and had gastritis on her previous EGD.  The patient also has cirrhosis and renal insufficiency.  The patient will be set up for an MRI of the abdomen due to her abdominal pain and cirrhosis.  The patient will also be set up for an upper endoscopy to see if there is any changes or something that may have developed that would explain her significant abdominal pain.  The possibility of using a TCA was discussed with the patient but there is cross reaction with this medication and some of her other medications.  The patient has been explained the plan and agree with it.     [name], MD. Clementeen Graham    Note: This dictation was prepared with Dragon dictation along with smaller phrase technology. Any transcriptional errors that result from this process are unintentional.

## 2022-08-20 NOTE — Patient Instructions (Signed)
Your MRI has been scheduled for Aug 24, 2022, you will need to arrive at 10:30am to register to [facility].  You can not have anything to eat or drink 4 hours prior.  If you need to cancel or reschedule, you can call scheduling directly at [phone]

## 2022-08-21 LAB — AFP TUMOR MARKER: AFP, Serum, Tumor Marker: 2.4 ng/mL (ref 0.0–9.2)

## 2022-08-24 ENCOUNTER — Ambulatory Visit
Admission: RE | Admit: 2022-08-24 | Discharge: 2022-08-24 | Disposition: A | Discharge status: Home / Self Care | Payer: Medicare PPO | Source: Ambulatory Visit | Attending: Gastroenterology | Admitting: Gastroenterology

## 2022-08-24 ENCOUNTER — Other Ambulatory Visit: Payer: Self-pay | Admitting: Gastroenterology

## 2022-08-24 ENCOUNTER — Ambulatory Visit: Payer: Medicare PPO | Admitting: Nurse Practitioner

## 2022-08-24 DIAGNOSIS — K7460 Unspecified cirrhosis of liver: Secondary | ICD-10-CM

## 2022-08-24 DIAGNOSIS — R1012 Left upper quadrant pain: Secondary | ICD-10-CM

## 2022-08-24 DIAGNOSIS — G8929 Other chronic pain: Secondary | ICD-10-CM | POA: Diagnosis not present

## 2022-08-24 DIAGNOSIS — R109 Unspecified abdominal pain: Secondary | ICD-10-CM | POA: Insufficient documentation

## 2022-08-24 NOTE — Addendum Note (Signed)
Addended by: Roena Malady on: 08/24/2022 02:50 PM   Modules accepted: Orders

## 2022-08-26 ENCOUNTER — Ambulatory Visit: Payer: Medicare PPO | Admitting: Nurse Practitioner

## 2022-08-26 ENCOUNTER — Encounter: Payer: Self-pay | Admitting: Nurse Practitioner

## 2022-08-26 VITALS — BP 130/75 | Temp 97.9°F | Wt 282.2 lb

## 2022-08-26 DIAGNOSIS — R1084 Generalized abdominal pain: Secondary | ICD-10-CM | POA: Diagnosis not present

## 2022-08-26 DIAGNOSIS — E1142 Type 2 diabetes mellitus with diabetic polyneuropathy: Secondary | ICD-10-CM | POA: Diagnosis not present

## 2022-08-26 DIAGNOSIS — Z7984 Long term (current) use of oral hypoglycemic drugs: Secondary | ICD-10-CM

## 2022-08-26 DIAGNOSIS — E1159 Type 2 diabetes mellitus with other circulatory complications: Secondary | ICD-10-CM | POA: Diagnosis not present

## 2022-08-26 DIAGNOSIS — I152 Hypertension secondary to endocrine disorders: Secondary | ICD-10-CM | POA: Diagnosis not present

## 2022-08-26 DIAGNOSIS — E1169 Type 2 diabetes mellitus with other specified complication: Secondary | ICD-10-CM

## 2022-08-26 DIAGNOSIS — E119 Type 2 diabetes mellitus without complications: Secondary | ICD-10-CM

## 2022-08-26 DIAGNOSIS — E785 Hyperlipidemia, unspecified: Secondary | ICD-10-CM | POA: Diagnosis not present

## 2022-08-26 DIAGNOSIS — N1832 Chronic kidney disease, stage 3b: Secondary | ICD-10-CM

## 2022-08-26 NOTE — Assessment & Plan Note (Signed)
Chronic.  Controlled.  Continue with current medication regimen.  Rechecked CMP at visit today.  Continue with farxiga.  Continue to follow up with Nephrologist. Return to clinic in 3 months for reevaluation.  Call sooner if concerns arise.

## 2022-08-26 NOTE — Assessment & Plan Note (Signed)
Chronic.  Controlled.  Continue with current medication regimen of Lyrica BID.  Labs ordered today.  Return to clinic in 3 months for reevaluation.  Call sooner if concerns arise.   

## 2022-08-26 NOTE — Assessment & Plan Note (Signed)
Chronic.  Controlled.  Need to restart statin therapy at next visit.  Labs ordered today.  Return to clinic in 3 months for reevaluation.  Call sooner if concerns arise.

## 2022-08-26 NOTE — Assessment & Plan Note (Signed)
Recommended eating smaller high protein, low fat meals more frequently and exercising 30 mins a day 5 times a week with a goal of 10-15lb weight loss in the next 3 months.  

## 2022-08-26 NOTE — Assessment & Plan Note (Signed)
Chronic.  Controlled.  Continue with current medication regimen of Benzapril 40mg  daily.  Recommend checking blood pressures at home.  Labs ordered today.  Return to clinic in 3 months for reevaluation.  Call sooner if concerns arise.

## 2022-08-26 NOTE — Progress Notes (Signed)
BP 130/75   Temp 97.9 F (36.6 C) (Oral)   Wt 282 lb 3.2 oz (128 kg)   SpO2 97%   BMI 51.62 kg/m    Subjective:    Patient ID: Anna Juarez, female    DOB: 1950-08-03, 72 y.o.   MRN: [id number]  HPI: Anna Juarez is a 72 y.o. female  Chief Complaint  Patient presents with   Hypertension   Diabetes   DIABETES Hypoglycemic episodes: no Polydipsia/polyuria: no Visual disturbance: no Chest pain: no Paresthesias: no Glucose Monitoring: yes  Accucheck frequency: Daily  Fasting glucose:   Post prandial:  Evening:  Before meals: Taking Insulin?: no  Long acting insulin:  Short acting insulin: Blood Pressure Monitoring: not checking Retinal Examination: Up to Date Foot Exam: Not up to Date Diabetic Education: Not Completed Pneumovax: Up to Date Influenza: Not up to Date Aspirin: no  HYPERTENSION / HYPERLIPIDEMIA Satisfied with current treatment? no Duration of hypertension: years BP monitoring frequency: not checking BP range:  BP medication side effects: no Past BP meds: benazepril Duration of hyperlipidemia: years Cholesterol medication side effects: no Cholesterol supplements: none Past cholesterol medications: lovastatin Medication compliance: excellent compliance Aspirin: no Recent stressors: no Recurrent headaches: no Visual changes: no Palpitations: no Dyspnea: no Chest pain: no Lower extremity edema: no Dizzy/lightheaded: no  CHRONIC KIDNEY DISEASE CKD status: controlled Medications renally dose: yes Previous renal evaluation: yes Pneumovax:  Up to Date Influenza Vaccine:  Not up to Date  STOMACH PAIN Patient states she saw GI and they have ordered an MRI and EGD.  Still looking for a cause of her symptoms. She states the Trulance didn't work for her.  It made her stool feel really firm and hard. She went back to the [facility].     Relevant past medical, surgical, family and social history reviewed and updated as indicated. Interim medical  history since our last visit reviewed. Allergies and medications reviewed and updated.  Review of Systems  Eyes:  Negative for visual disturbance.  Respiratory:  Negative for chest tightness and shortness of breath.   Cardiovascular:  Negative for chest pain, palpitations and leg swelling.  Gastrointestinal:  Positive for abdominal pain and nausea.  Endocrine: Negative for polydipsia and polyuria.  Musculoskeletal:        Hip pain  Neurological:  Negative for dizziness, light-headedness, numbness and headaches.  Psychiatric/Behavioral:  Negative for dysphoric mood and sleep disturbance.     Per HPI unless specifically indicated above     Objective:    BP 130/75   Temp 97.9 F (36.6 C) (Oral)   Wt 282 lb 3.2 oz (128 kg)   SpO2 97%   BMI 51.62 kg/m   Wt Readings from Last 3 Encounters:  08/26/22 282 lb 3.2 oz (128 kg)  08/20/22 282 lb (127.9 kg)  08/10/22 283 lb 12.8 oz (128.7 kg)    Physical Exam Vitals and nursing note reviewed.  Constitutional:      General: She is not in acute distress.    Appearance: Normal appearance. She is obese. She is not ill-appearing, toxic-appearing or diaphoretic.  HENT:     Head: Normocephalic.     Right Ear: External ear normal.     Left Ear: External ear normal.     Nose: Nose normal.     Mouth/Throat:     Mouth: Mucous membranes are moist.     Pharynx: Oropharynx is clear.  Eyes:     General:  Right eye: No discharge.        Left eye: No discharge.     Extraocular Movements: Extraocular movements intact.     Conjunctiva/sclera: Conjunctivae normal.     Pupils: Pupils are equal, round, and reactive to light.  Cardiovascular:     Rate and Rhythm: Normal rate and regular rhythm.     Heart sounds: No murmur heard. Pulmonary:     Effort: Pulmonary effort is normal. No respiratory distress.     Breath sounds: Normal breath sounds. No wheezing or rales.  Abdominal:     Tenderness: There is abdominal tenderness in the right  upper quadrant. There is no right CVA tenderness or guarding. Negative signs include Murphy's sign.  Musculoskeletal:     Cervical back: Normal range of motion and neck supple.  Skin:    General: Skin is warm and dry.     Capillary Refill: Capillary refill takes less than 2 seconds.  Neurological:     General: No focal deficit present.     Mental Status: She is alert and oriented to person, place, and time. Mental status is at baseline.  Psychiatric:        Mood and Affect: Mood normal.        Behavior: Behavior normal.        Thought Content: Thought content normal.        Judgment: Judgment normal.     Comments: tearful     Results for orders placed or performed in visit on 08/20/22  AFP tumor marker  Result Value Ref Range   AFP, Serum, Tumor Marker 2.4 0.0 - 9.2 ng/mL      Assessment & Plan:   Problem List Items Addressed This Visit       Cardiovascular and Mediastinum   Hypertension associated with diabetes (HCC) - Primary    Chronic.  Controlled.  Continue with current medication regimen of Benzapril 40mg  daily.  Recommend checking blood pressures at home.  Labs ordered today.  Return to clinic in 3 months for reevaluation.  Call sooner if concerns arise.        Relevant Orders   Comp Met (CMET)     Endocrine   DM type 2 with diabetic peripheral neuropathy (HCC)    Chronic.  Controlled.  Continue with current medication regimen of Lyrica BID.  Labs ordered today.  Return to clinic in 3 months for reevaluation.  Call sooner if concerns arise.        Hyperlipidemia associated with type 2 diabetes mellitus (HCC)    Chronic.  Controlled.  Need to restart statin therapy at next visit.  Labs ordered today.  Return to clinic in 3 months for reevaluation.  Call sooner if concerns arise.        Relevant Orders   Lipid Profile   Diabetes mellitus without complication (HCC)    Chronic.  Controlled.  A1c is 7.0%.  Continue with current medication regimen on Farxiga 10mg   daily.  Microalbumin ordered today.  Labs ordered today.  Return to clinic in 3 months for reevaluation.  Call sooner if concerns arise.        Relevant Orders   HgB A1c     Genitourinary   CKD (chronic kidney disease) stage 3, GFR 30-59 ml/min (HCC)    Chronic.  Controlled.  Continue with current medication regimen.  Rechecked CMP at visit today.  Continue with farxiga.  Continue to follow up with Nephrologist. Return to clinic in 3 months for reevaluation.  Call sooner if concerns arise.          Other   Generalized abdominal pain    Chronic.  Recently saw GI.  Has MRI done on 5/20. Results haven't been read yet.  Has EGD scheduled for June 11.      Morbid obesity (HCC)    Recommended eating smaller high protein, low fat meals more frequently and exercising 30 mins a day 5 times a week with a goal of 10-15lb weight loss in the next 3 months.         Follow up plan: Return in about 3 months (around 11/26/2022) for HTN, HLD, DM2 FU.

## 2022-08-26 NOTE — Assessment & Plan Note (Signed)
Chronic.  Controlled.  A1c is 7.0%.  Continue with current medication regimen on Farxiga 10mg  daily.  Microalbumin ordered today.  Labs ordered today.  Return to clinic in 3 months for reevaluation.  Call sooner if concerns arise.

## 2022-08-26 NOTE — Assessment & Plan Note (Signed)
Chronic.  Recently saw GI.  Has MRI done on 5/20. Results haven't been read yet.  Has EGD scheduled for June 11.

## 2022-08-27 LAB — COMPREHENSIVE METABOLIC PANEL
ALT: 10 IU/L (ref 0–32)
AST: 17 IU/L (ref 0–40)
Albumin/Globulin Ratio: 1.7 (ref 1.2–2.2)
Albumin: 4.3 g/dL (ref 3.8–4.8)
Alkaline Phosphatase: 75 IU/L (ref 44–121)
BUN/Creatinine Ratio: 15 (ref 12–28)
BUN: 22 mg/dL (ref 8–27)
Bilirubin Total: 0.6 mg/dL (ref 0.0–1.2)
CO2: 24 mmol/L (ref 20–29)
Calcium: 9.6 mg/dL (ref 8.7–10.3)
Chloride: 101 mmol/L (ref 96–106)
Creatinine, Ser: 1.49 mg/dL — ABNORMAL HIGH (ref 0.57–1.00)
Globulin, Total: 2.6 g/dL (ref 1.5–4.5)
Glucose: 127 mg/dL — ABNORMAL HIGH (ref 70–99)
Potassium: 4.7 mmol/L (ref 3.5–5.2)
Sodium: 141 mmol/L (ref 134–144)
Total Protein: 6.9 g/dL (ref 6.0–8.5)
eGFR: 37 mL/min/{1.73_m2} — ABNORMAL LOW (ref >59)

## 2022-08-27 LAB — LIPID PANEL
Chol/HDL Ratio: 3.7 ratio (ref 0.0–4.4)
Cholesterol, Total: 194 mg/dL (ref 100–199)
HDL: 52 mg/dL (ref >39)
LDL Chol Calc (NIH): 106 mg/dL — ABNORMAL HIGH (ref 0–99)
Triglycerides: 207 mg/dL — ABNORMAL HIGH (ref 0–149)
VLDL Cholesterol Cal: 36 mg/dL (ref 5–40)

## 2022-08-27 LAB — HEMOGLOBIN A1C
Est. average glucose Bld gHb Est-mCnc: 169 mg/dL
Hgb A1c MFr Bld: 7.5 % — ABNORMAL HIGH (ref 4.8–5.6)

## 2022-08-28 DIAGNOSIS — H26493 Other secondary cataract, bilateral: Secondary | ICD-10-CM | POA: Diagnosis not present

## 2022-08-28 NOTE — Progress Notes (Signed)
Please let patient know that overall her lab work looks good.  Her A1c keeps increasing slightly.  If elevated at next visit, we will need to add additional medication.  Kidney function is stable.  Follow up as discussed.

## 2022-08-30 ENCOUNTER — Other Ambulatory Visit: Payer: Self-pay | Admitting: Nurse Practitioner

## 2022-08-30 DIAGNOSIS — I10 Essential (primary) hypertension: Secondary | ICD-10-CM

## 2022-08-30 DIAGNOSIS — E1142 Type 2 diabetes mellitus with diabetic polyneuropathy: Secondary | ICD-10-CM

## 2022-09-01 NOTE — Telephone Encounter (Signed)
Requested medication (s) are due for refill today - provider review   Requested medication (s) are on the active medication list -yes  Future visit scheduled -yes  Last refill: 07/29/22  Notes to clinic: non delegated Rx, listed as historical provider  Requested Prescriptions  Pending Prescriptions Disp Refills   pregabalin (LYRICA) 150 MG capsule [Pharmacy Med Name: PREGABALIN 150 MG CAPSULE] 270 capsule     Sig: TAKE 1 CAPSULE BY MOUTH 3 TIMES DAILY.     Not Delegated - Neurology:  Anticonvulsants - Controlled - pregabalin Failed - 08/30/2022  5:50 PM      Failed - This refill cannot be delegated      Failed - Cr in normal range and within 360 days    Creatinine  Date Value Ref Range Status  08/10/2022 1.35 (H) 0.44 - 1.00 mg/dL Final   Creatinine, Ser  Date Value Ref Range Status  08/26/2022 1.49 (H) 0.57 - 1.00 mg/dL Final         Passed - Completed PHQ-2 or PHQ-9 in the last 360 days      Passed - Valid encounter within last 12 months    Recent Outpatient Visits           6 days ago Hypertension associated with diabetes [facility])   [place] [facility] Larae Grooms, NP   1 month ago Acute intractable headache, unspecified headache type   [place] [facility] Larae Grooms, NP   2 months ago Constipation, unspecified constipation type   [place] [facility] Larae Grooms, NP   3 months ago Hypertension associated with diabetes [facility])   [place] [facility] Larae Grooms, NP   6 months ago Stage 3b chronic kidney disease [facility])   [place] [facility] Larae Grooms, NP       Future Appointments             In 2 months Laural Benes, Oralia Rud, DO [place] [facility]            Signed Prescriptions Disp Refills   metoprolol succinate (TOPROL-XL) 50 MG 24 hr tablet 90 tablet 1    Sig: TAKE 1 TABLET BY MOUTH DAILY. TAKE WITH OR IMMEDIATELY FOLLOWING A  MEAL.     Cardiovascular:  Beta Blockers Passed - 08/30/2022  5:50 PM      Passed - Last BP in normal range    BP Readings from Last 1 Encounters:  08/26/22 130/75         Passed - Last Heart Rate in normal range    Pulse Readings from Last 1 Encounters:  08/20/22 89         Passed - Valid encounter within last 6 months    Recent Outpatient Visits           6 days ago Hypertension associated with diabetes [facility])   [place] [facility] Larae Grooms, NP   1 month ago Acute intractable headache, unspecified headache type   [place] [facility] Larae Grooms, NP   2 months ago Constipation, unspecified constipation type   [place] [facility] Grooms, NP   3 months ago Hypertension associated with diabetes [facility])   [place] [facility] Larae Grooms, NP   6 months ago Stage 3b chronic kidney disease [facility])   [place] [facility] Larae Grooms, NP       Future Appointments  In 2 months Johnson, Megan P, DO [place] [facility]             benazepril (LOTENSIN) 40 MG tablet 90 tablet 1    Sig: TAKE 1 TABLET BY MOUTH EVERY DAY     Cardiovascular:  ACE Inhibitors Failed - 08/30/2022  5:50 PM      Failed - Cr in normal range and within 180 days    Creatinine  Date Value Ref Range Status  08/10/2022 1.35 (H) 0.44 - 1.00 mg/dL Final   Creatinine, Ser  Date Value Ref Range Status  08/26/2022 1.49 (H) 0.57 - 1.00 mg/dL Final         Passed - K in normal range and within 180 days    Potassium  Date Value Ref Range Status  08/26/2022 4.7 3.5 - 5.2 mmol/L Final         Passed - Patient is not pregnant      Passed - Last BP in normal range    BP Readings from Last 1 Encounters:  08/26/22 130/75         Passed - Valid encounter within last 6 months    Recent Outpatient Visits           6 days ago Hypertension associated with  diabetes [facility])   [place] [facility] Larae Grooms, NP   1 month ago Acute intractable headache, unspecified headache type   [place] [facility] Larae Grooms, NP   2 months ago Constipation, unspecified constipation type   [place] [facility] Larae Grooms, NP   3 months ago Hypertension associated with diabetes [facility])   [place] [facility] Larae Grooms, NP   6 months ago Stage 3b chronic kidney disease [facility])   [place] [facility] Larae Grooms, NP       Future Appointments             In 2 months Laural Benes, Oralia Rud, DO [place] [facility]               Requested Prescriptions  Pending Prescriptions Disp Refills   pregabalin (LYRICA) 150 MG capsule [Pharmacy Med Name: PREGABALIN 150 MG CAPSULE] 270 capsule     Sig: TAKE 1 CAPSULE BY MOUTH 3 TIMES DAILY.     Not Delegated - Neurology:  Anticonvulsants - Controlled - pregabalin Failed - 08/30/2022  5:50 PM      Failed - This refill cannot be delegated      Failed - Cr in normal range and within 360 days    Creatinine  Date Value Ref Range Status  08/10/2022 1.35 (H) 0.44 - 1.00 mg/dL Final   Creatinine, Ser  Date Value Ref Range Status  08/26/2022 1.49 (H) 0.57 - 1.00 mg/dL Final         Passed - Completed PHQ-2 or PHQ-9 in the last 360 days      Passed - Valid encounter within last 12 months    Recent Outpatient Visits           6 days ago Hypertension associated with diabetes [facility])   [place] [facility] Larae Grooms, NP   1 month ago Acute intractable headache, unspecified headache type   [place] [facility] Larae Grooms, NP   2 months ago Constipation, unspecified constipation type   [place] [facility] Larae Grooms, NP   3 months ago Hypertension associated with diabetes (  HCC)   [place] [facility]  Larae Grooms, NP   6 months ago Stage 3b chronic kidney disease [facility])   [place] [facility] Larae Grooms, NP       Future Appointments             In 2 months Laural Benes, Oralia Rud, DO [place] [facility]            Signed Prescriptions Disp Refills   metoprolol succinate (TOPROL-XL) 50 MG 24 hr tablet 90 tablet 1    Sig: TAKE 1 TABLET BY MOUTH DAILY. TAKE WITH OR IMMEDIATELY FOLLOWING A MEAL.     Cardiovascular:  Beta Blockers Passed - 08/30/2022  5:50 PM      Passed - Last BP in normal range    BP Readings from Last 1 Encounters:  08/26/22 130/75         Passed - Last Heart Rate in normal range    Pulse Readings from Last 1 Encounters:  08/20/22 89         Passed - Valid encounter within last 6 months    Recent Outpatient Visits           6 days ago Hypertension associated with diabetes (HCC)   [facility] Larae Grooms, NP   1 month ago Acute intractable headache, unspecified headache type   [place] [facility] Larae Grooms, NP   2 months ago Constipation, unspecified constipation type   [place] [facility] Larae Grooms, NP   3 months ago Hypertension associated with diabetes [facility])   [place] [facility] Larae Grooms, NP   6 months ago Stage 3b chronic kidney disease (HCC)   [place] [facility] Larae Grooms, NP       Future Appointments             In 2 months Johnson, Megan P, DO [place] [facility]             benazepril (LOTENSIN) 40 MG tablet 90 tablet 1    Sig: TAKE 1 TABLET BY MOUTH EVERY DAY     Cardiovascular:  ACE Inhibitors Failed - 08/30/2022  5:50 PM      Failed - Cr in normal range and within 180 days    Creatinine  Date Value Ref Range Status  08/10/2022 1.35 (H) 0.44 - 1.00 mg/dL Final   Creatinine, Ser  Date Value Ref Range Status  08/26/2022 1.49 (H)  0.57 - 1.00 mg/dL Final         Passed - K in normal range and within 180 days    Potassium  Date Value Ref Range Status  08/26/2022 4.7 3.5 - 5.2 mmol/L Final         Passed - Patient is not pregnant      Passed - Last BP in normal range    BP Readings from Last 1 Encounters:  08/26/22 130/75         Passed - Valid encounter within last 6 months    Recent Outpatient Visits           6 days ago Hypertension associated with diabetes [facility])   [place] [facility] Larae Grooms, NP   1 month ago Acute intractable headache, unspecified headache type   [place] [facility] Larae Grooms, NP   2 months ago Constipation, unspecified constipation type    [facility] Larae Grooms, NP   3 months ago Hypertension associated with diabetes [facility])   [place] [facility] Larae Grooms, NP   6 months ago Stage 3b chronic kidney disease [facility])   [place] [facility] Larae Grooms, NP       Future Appointments             In 2 months Laural Benes, Oralia Rud, DO  [facility]

## 2022-09-01 NOTE — Telephone Encounter (Signed)
Requested Prescriptions  Pending Prescriptions Disp Refills   pregabalin (LYRICA) 150 MG capsule [Pharmacy Med Name: PREGABALIN 150 MG CAPSULE] 270 capsule     Sig: TAKE 1 CAPSULE BY MOUTH 3 TIMES DAILY.     Not Delegated - Neurology:  Anticonvulsants - Controlled - pregabalin Failed - 08/30/2022  5:50 PM      Failed - This refill cannot be delegated      Failed - Cr in normal range and within 360 days    Creatinine  Date Value Ref Range Status  08/10/2022 1.35 (H) 0.44 - 1.00 mg/dL Final   Creatinine, Ser  Date Value Ref Range Status  08/26/2022 1.49 (H) 0.57 - 1.00 mg/dL Final         Passed - Completed PHQ-2 or PHQ-9 in the last 360 days      Passed - Valid encounter within last 12 months    Recent Outpatient Visits           6 days ago Hypertension associated with diabetes [facility])   [place] [facility] G[facility], NP   1 month ago Acute intractable headache, unspecified headache type   [place] [facility] Larae Grooms, NP   2 months ago Constipation, unspecified constipation type   [place] [facility] Larae Grooms, NP   3 months ago Hypertension associated with diabetes [facility])   [place] [facility] Larae Grooms, NP   6 months ago Stage 3b chronic kidney disease [facility])   [place] [facility] Larae Grooms, NP       Future Appointments             In 2 months Laural Benes, Megan P, DO [place] [facility]             metoprolol succinate (TOPROL-XL) 50 MG 24 hr tablet [Pharmacy Med Name: METOPROLOL SUCC ER 50 MG TAB] 90 tablet 1    Sig: TAKE 1 TABLET BY MOUTH DAILY. TAKE WITH OR IMMEDIATELY FOLLOWING A MEAL.     Cardiovascular:  Beta Blockers Passed - 08/30/2022  5:50 PM      Passed - Last BP in normal range    BP Readings from Last 1 Encounters:  08/26/22 130/75         Passed - Last Heart Rate in normal range    Pulse Readings from Last  1 Encounters:  08/20/22 89         Passed - Valid encounter within last 6 months    Recent Outpatient Visits           6 days ago Hypertension associated with diabetes [facility])   [place] [facility] Larae Grooms, NP   1 month ago Acute intractable headache, unspecified headache type   [place] [facility] Lar[facility] G[facility], NP   2 months ago Constipation, unspecified constipation type   [place] [facility] Larae Grooms, NP   3 months ago Hypertension associated with diabetes [facility])   [place] [facility] Larae Grooms, NP   6 months ago Stage 3b chronic kidney disease [facility])   [place] Larae Grooms, NP       Future Appointments             In 2 months Laural Benes, Oralia Rud, DO  [facility]             benazepril (LOTENSIN) 40 MG tablet [place] Med  Name: BENAZEPRIL HCL 40 MG TABLET] 90 tablet 1    Sig: TAKE 1 TABLET BY MOUTH EVERY DAY     Cardiovascular:  ACE Inhibitors Failed - 08/30/2022  5:50 PM      Failed - Cr in normal range and within 180 days    Creatinine  Date Value Ref Range Status  08/10/2022 1.35 (H) 0.44 - 1.00 mg/dL Final   Creatinine, Ser  Date Value Ref Range Status  08/26/2022 1.49 (H) 0.57 - 1.00 mg/dL Final         Passed - K in normal range and within 180 days    Potassium  Date Value Ref Range Status  08/26/2022 4.7 3.5 - 5.2 mmol/L Final         Passed - Patient is not pregnant      Passed - Last BP in normal range    BP Readings from Last 1 Encounters:  08/26/22 130/75         Passed - Valid encounter within last 6 months    Recent Outpatient Visits           6 days ago Hypertension associated with diabetes [facility])   [place] [facility] Larae Grooms, NP   1 month ago Acute intractable headache, unspecified headache type   [place] [facility] Larae Grooms,  NP   2 months ago Constipation, unspecified constipation type   [place] [facility] Larae Grooms, NP   3 months ago Hypertension associated with diabetes [facility])   [place] [facility] Larae Grooms, NP   6 months ago Stage 3b chronic kidney disease [facility])   [place] [facility] Larae Grooms, NP       Future Appointments             In 2 months Laural Benes, Oralia Rud, DO [place] [facility]

## 2022-09-02 ENCOUNTER — Encounter (INDEPENDENT_AMBULATORY_CARE_PROVIDER_SITE_OTHER): Payer: Medicare PPO | Admitting: Internal Medicine

## 2022-09-02 DIAGNOSIS — G4733 Obstructive sleep apnea (adult) (pediatric): Secondary | ICD-10-CM | POA: Diagnosis not present

## 2022-09-15 ENCOUNTER — Ambulatory Visit: Payer: Medicare PPO | Admitting: Certified Registered"

## 2022-09-15 ENCOUNTER — Encounter: Payer: Self-pay | Admitting: Gastroenterology

## 2022-09-15 ENCOUNTER — Ambulatory Visit
Admission: RE | Admit: 2022-09-15 | Discharge: 2022-09-15 | Disposition: A | Discharge status: Home / Self Care | Payer: Medicare PPO | Attending: Gastroenterology | Admitting: Gastroenterology

## 2022-09-15 ENCOUNTER — Encounter
Admission: RE | Disposition: A | Discharge status: Home / Self Care | Payer: Self-pay | Source: Home / Self Care | Attending: Gastroenterology

## 2022-09-15 DIAGNOSIS — I129 Hypertensive chronic kidney disease with stage 1 through stage 4 chronic kidney disease, or unspecified chronic kidney disease: Secondary | ICD-10-CM | POA: Diagnosis not present

## 2022-09-15 DIAGNOSIS — K219 Gastro-esophageal reflux disease without esophagitis: Secondary | ICD-10-CM | POA: Diagnosis not present

## 2022-09-15 DIAGNOSIS — K2970 Gastritis, unspecified, without bleeding: Secondary | ICD-10-CM | POA: Insufficient documentation

## 2022-09-15 DIAGNOSIS — N183 Chronic kidney disease, stage 3 unspecified: Secondary | ICD-10-CM | POA: Insufficient documentation

## 2022-09-15 DIAGNOSIS — Z6841 Body Mass Index (BMI) 40.0 and over, adult: Secondary | ICD-10-CM | POA: Diagnosis not present

## 2022-09-15 DIAGNOSIS — E1122 Type 2 diabetes mellitus with diabetic chronic kidney disease: Secondary | ICD-10-CM | POA: Diagnosis not present

## 2022-09-15 DIAGNOSIS — I4891 Unspecified atrial fibrillation: Secondary | ICD-10-CM | POA: Diagnosis not present

## 2022-09-15 DIAGNOSIS — R1012 Left upper quadrant pain: Secondary | ICD-10-CM | POA: Diagnosis not present

## 2022-09-15 DIAGNOSIS — Z7984 Long term (current) use of oral hypoglycemic drugs: Secondary | ICD-10-CM | POA: Diagnosis not present

## 2022-09-15 DIAGNOSIS — R1084 Generalized abdominal pain: Secondary | ICD-10-CM | POA: Diagnosis not present

## 2022-09-15 DIAGNOSIS — G4730 Sleep apnea, unspecified: Secondary | ICD-10-CM | POA: Insufficient documentation

## 2022-09-15 DIAGNOSIS — I10 Essential (primary) hypertension: Secondary | ICD-10-CM | POA: Diagnosis not present

## 2022-09-15 HISTORY — PX: ESOPHAGOGASTRODUODENOSCOPY (EGD) WITH PROPOFOL: SHX5813

## 2022-09-15 LAB — GLUCOSE, CAPILLARY: Glucose-Capillary: 138 mg/dL — ABNORMAL HIGH (ref 70–99)

## 2022-09-15 SURGERY — ESOPHAGOGASTRODUODENOSCOPY (EGD) WITH PROPOFOL
Anesthesia: General

## 2022-09-15 MED ORDER — SODIUM CHLORIDE 0.9 % IV SOLN: INTRAVENOUS | Status: DC

## 2022-09-15 MED ORDER — DEXMEDETOMIDINE HCL IN NACL 80 MCG/20ML IV SOLN
INTRAVENOUS | Status: DC | PRN
  Administered 2022-09-15: 4 ug via INTRAVENOUS

## 2022-09-15 MED ORDER — LIDOCAINE HCL (CARDIAC) PF 100 MG/5ML IV SOSY
PREFILLED_SYRINGE | INTRAVENOUS | Status: DC | PRN
  Administered 2022-09-15: 60 mg via INTRAVENOUS

## 2022-09-15 MED ORDER — SODIUM CHLORIDE 0.9 % IV SOLN: INTRAVENOUS | Status: DC | PRN

## 2022-09-15 MED ORDER — ONDANSETRON HCL 4 MG/2ML IJ SOLN
INTRAMUSCULAR | Status: DC | PRN
  Administered 2022-09-15: 4 mg via INTRAVENOUS

## 2022-09-15 MED ORDER — GLYCOPYRROLATE 0.2 MG/ML IJ SOLN
INTRAMUSCULAR | Status: DC | PRN
  Administered 2022-09-15: .2 mg via INTRAVENOUS

## 2022-09-15 MED ORDER — PROPOFOL 500 MG/50ML IV EMUL
INTRAVENOUS | Status: DC | PRN
  Administered 2022-09-15: 150 ug/kg/min via INTRAVENOUS
  Administered 2022-09-15: 20 mg via INTRAVENOUS

## 2022-09-15 NOTE — Anesthesia Preprocedure Evaluation (Signed)
Anesthesia Evaluation  Patient identified by MRN, date of birth, ID band Patient awake    Reviewed: Allergy & Precautions, H&P , NPO status , Patient's Chart, lab work & pertinent test results, reviewed documented beta blocker date and time   History of Anesthesia Complications (+) PONV, AWARENESS UNDER ANESTHESIA and history of anesthetic complications  Airway Mallampati: III   Neck ROM: full    Dental  (+) Poor Dentition, Dental Advidsory Given   Pulmonary neg pulmonary ROS, sleep apnea and Continuous Positive Airway Pressure Ventilation    Pulmonary exam normal        Cardiovascular Exercise Tolerance: Poor hypertension, On Medications (-) angina negative cardio ROS Normal cardiovascular exam(-) dysrhythmias  Rhythm:regular Rate:Normal     Neuro/Psych  Neuromuscular disease negative neurological ROS  negative psych ROS   GI/Hepatic negative GI ROS, Neg liver ROS,GERD  Medicated,,  Endo/Other  negative endocrine ROSdiabetes    Renal/GU Renal diseasenegative Renal ROS  negative genitourinary   Musculoskeletal   Abdominal   Peds  Hematology negative hematology ROS (+)   Anesthesia Other Findings Past Medical History: No date: Arthritis No date: Chronic kidney disease No date: Complication of anesthesia No date: Diabetes mellitus without complication (HCC) No date: Extreme obesity No date: Fatty liver No date: GERD (gastroesophageal reflux disease) No date: Hyperlipidemia No date: Hypertension No date: Palpitations No date: PONV (postoperative nausea and vomiting) No date: Radiculopathy, cervical No date: Renal insufficiency     Comment:  stage 3 No date: Sleep apnea     Comment:  uses cpap 01/2021: Tear of acetabular labrum     Comment:  right Past Surgical History: No date: ABDOMINAL HYSTERECTOMY 06/02/2021: CATARACT EXTRACTION W/PHACO; Right     Comment:  Procedure: CATARACT EXTRACTION PHACO AND  INTRAOCULAR               LENS PLACEMENT (IOC) RIGHT DIABETIC;  Surgeon: Nevada Crane, MD;  Location: [facility];                Service: Ophthalmology;  Laterality: Right;  2.52 0:22.8 06/16/2021: CATARACT EXTRACTION W/PHACO; Left     Comment:  Procedure: CATARACT EXTRACTION PHACO AND INTRAOCULAR               LENS PLACEMENT (IOC) LEFT DIABETIC 3.51 00:30.9;                Surgeon: Nevada Crane, MD;  Location: [facility]               SURGERY [facility];  Service: Ophthalmology;  Laterality: Left; No date: CERVICAL FUSION     Comment:  x2 No date: CHOLECYSTECTOMY No date: COLONOSCOPY No date: JOINT REPLACEMENT; Bilateral     Comment:  knees BMI    Body Mass Index: 53.96 kg/m     Reproductive/Obstetrics negative OB ROS                             Anesthesia Physical Anesthesia Plan  ASA: 3  Anesthesia Plan: General   Post-op Pain Management: Minimal or no pain anticipated   Induction: Intravenous  PONV Risk Score and Plan: 3 and Propofol infusion, TIVA and Ondansetron  Airway Management Planned: Nasal Cannula  Additional Equipment: None  Intra-op Plan:   Post-operative Plan:   Informed Consent: I have reviewed the patients History and Physical, chart, labs and discussed the  procedure including the risks, benefits and alternatives for the proposed anesthesia with the patient or authorized representative who has indicated his/her understanding and acceptance.     Dental advisory given  Plan Discussed with: CRNA and Surgeon  Anesthesia Plan Comments: (Discussed risks of anesthesia with patient, including possibility of difficulty with spontaneous ventilation under anesthesia necessitating airway intervention, PONV, and rare risks such as cardiac or respiratory or neurological events, and allergic reactions. Discussed the role of CRNA in patient's perioperative care. Patient understands.)        Anesthesia Quick  Evaluation

## 2022-09-15 NOTE — Op Note (Signed)
[facility] Gastroenterology Patient Name: Anna Juarez Procedure Date: 09/15/2022 11:43 AM MRN: [id number] Account #: [id number] Date of Birth: [birth year] Admit Type: Outpatient Age: 72 Room: [facility] ENDO ROOM 4 Gender: Female Note Status: Finalized Instrument Name: Upper Endoscope [id number] Procedure:             Upper GI endoscopy Indications:           Generalized abdominal pain Providers:             [name] MD, MD Referring MD:          No Local Md, MD (Referring MD) Medicines:             Propofol per Anesthesia Complications:         No immediate complications. Procedure:             Pre-Anesthesia Assessment:                        - Prior to the procedure, a History and Physical was                         performed, and patient medications and allergies were                         reviewed. The patient's tolerance of previous                         anesthesia was also reviewed. The risks and benefits                         of the procedure and the sedation options and risks                         were discussed with the patient. All questions were                         answered, and informed consent was obtained. Prior                         Anticoagulants: The patient has taken no anticoagulant                         or antiplatelet agents. ASA Grade Assessment: II - A                         patient with mild systemic disease. After reviewing                         the risks and benefits, the patient was deemed in                         satisfactory condition to undergo the procedure.                        After obtaining informed consent, the endoscope was                         passed under direct vision. Throughout the procedure,  the patient's blood pressure, pulse, and oxygen                         saturations were monitored continuously. The Endoscope                         was introduced through the mouth, and  advanced to the                         second part of duodenum. The upper GI endoscopy was                         accomplished without difficulty. The patient tolerated                         the procedure well. Findings:      The examined esophagus was normal.      Localized mild inflammation characterized by erythema was found in the       gastric antrum. Biopsies were taken with a cold forceps for histology.      The examined duodenum was normal. Impression:            - Normal esophagus.                        - Gastritis. Biopsied.                        - Normal examined duodenum. Recommendation:        - Discharge patient to home.                        - Resume previous diet.                        - Continue present medications.                        - Await pathology results. Procedure Code(s):     --- Professional ---                        [phone], Esophagogastroduodenoscopy, flexible,                         transoral; with biopsy, single or multiple Diagnosis Code(s):     --- Professional ---                        R10.84, Generalized abdominal pain                        K29.70, Gastritis, unspecified, without bleeding CPT copyright 2022 American Medical Association. All rights reserved. The codes documented in this report are preliminary and upon coder review may  be revised to meet current compliance requirements. [name] MD, MD 09/15/2022 11:55:30 AM This report has been signed electronically. Number of Addenda: 0 Note Initiated On: 09/15/2022 11:43 AM Estimated Blood Loss:  Estimated blood loss: none.      [facility]

## 2022-09-15 NOTE — Progress Notes (Addendum)
Anesthesiologist was notified that heart rhythm showed afib post procedure. A 12 lead EKG was performed per Dr. Henriette Combs order and has been reviewed by him. Dr. Laural Benes has also contacted cardiology. Dr. Jari Sportsman office will set up appointment to see patient. Patients sister was present to hear discussion with Dr. Laural Benes. Vital signs remained stable throughout post procedure recovery. Ms. Dyckman denied chest pain or not feeling well. Okay to discharge. Dr. Jari Sportsman office telephone number and address was provided as well.

## 2022-09-15 NOTE — Transfer of Care (Signed)
Immediate Anesthesia Transfer of Care Note  Patient: Anna Juarez  Procedure(s) Performed: ESOPHAGOGASTRODUODENOSCOPY (EGD) WITH PROPOFOL  Patient Location: PACU  Anesthesia Type:MAC  Level of Consciousness: drowsy  Airway & Oxygen Therapy: Patient Spontanous Breathing  Post-op Assessment: Report given to RN and Post -op Vital signs reviewed and stable  Post vital signs: Reviewed  Last Vitals:  Vitals Value Taken Time  BP 116/78 09/15/22 1201  Temp 35.9 C 09/15/22 1201  Pulse 58 09/15/22 1208  Resp 13 09/15/22 1208  SpO2 96 % 09/15/22 1208  Vitals shown include unvalidated device data.  Last Pain:  Vitals:   09/15/22 1201  TempSrc: Temporal  PainSc: 0-No pain         Complications: No notable events documented.

## 2022-09-15 NOTE — H&P (Signed)
Anna Minium, MD [facility] [address] [place], [place] [address] Phone:[phone] Fax : [phone]  Primary Care Physician:  Larae Grooms, NP Primary Gastroenterologist:  Dr. Servando Snare  Pre-Procedure History & Physical: HPI:  Anna Juarez is a 72 y.o. female is here for an endoscopy.   Past Medical History:  Diagnosis Date   Arthritis    Chronic kidney disease    Complication of anesthesia    Diabetes mellitus without complication (HCC)    Extreme obesity    Fatty liver    GERD (gastroesophageal reflux disease)    Hyperlipidemia    Hypertension    Palpitations    PONV (postoperative nausea and vomiting)    Radiculopathy, cervical    Renal insufficiency    stage 3   Sleep apnea    uses cpap   Tear of acetabular labrum 01/2021   right    Past Surgical History:  Procedure Laterality Date   ABDOMINAL HYSTERECTOMY     CATARACT EXTRACTION W/PHACO Right 06/02/2021   Procedure: CATARACT EXTRACTION PHACO AND INTRAOCULAR LENS PLACEMENT (IOC) RIGHT DIABETIC;  Surgeon: Nevada Crane, MD;  Location: [facility];  Service: Ophthalmology;  Laterality: Right;  2.52 0:22.8   CATARACT EXTRACTION W/PHACO Left 06/16/2021   Procedure: CATARACT EXTRACTION PHACO AND INTRAOCULAR LENS PLACEMENT (IOC) LEFT DIABETIC 3.51 00:30.9;  Surgeon: Nevada Crane, MD;  Location: [facility];  Service: Ophthalmology;  Laterality: Left;   CERVICAL FUSION     x2   CHOLECYSTECTOMY     COLONOSCOPY     COLONOSCOPY WITH PROPOFOL N/A 09/09/2021   Procedure: COLONOSCOPY WITH PROPOFOL;  Surgeon: Anna Minium, MD;  Location: [facility] ENDOSCOPY;  Service: Endoscopy;  Laterality: N/A;   ESOPHAGOGASTRODUODENOSCOPY (EGD) WITH PROPOFOL N/A 09/09/2021   Procedure: ESOPHAGOGASTRODUODENOSCOPY (EGD) WITH PROPOFOL;  Surgeon: Anna Minium, MD;  Location: [facility] ENDOSCOPY;  Service: Endoscopy;  Laterality: N/A;   JOINT REPLACEMENT Bilateral    knees    Prior to Admission medications    Medication Sig Start Date End Date Taking? Authorizing Provider  benazepril (LOTENSIN) 40 MG tablet TAKE 1 TABLET BY MOUTH EVERY DAY 09/01/22  Yes Larae Grooms, NP  dapagliflozin propanediol (FARXIGA) 10 MG TABS tablet TAKE 1 TABLET BY MOUTH DAILY BEFORE BREAKFAST. 07/17/22  Yes Larae Grooms, NP  linaclotide [facility]) 145 MCG CAPS capsule Take 1 capsule (145 mcg total) by mouth daily before breakfast. 08/17/22  Yes Larae Grooms, NP  metoprolol succinate (TOPROL-XL) 50 MG 24 hr tablet TAKE 1 TABLET BY MOUTH DAILY. TAKE WITH OR IMMEDIATELY FOLLOWING A MEAL. 09/01/22  Yes Larae Grooms, NP  pantoprazole (PROTONIX) 40 MG tablet Take 1 tablet (40 mg total) by mouth 2 (two) times daily before a meal. 02/19/22  Yes Larae Grooms, NP  pregabalin (LYRICA) 150 MG capsule TAKE 1 CAPSULE BY MOUTH 3 TIMES DAILY. 09/01/22  Yes Larae Grooms, NP  traMADol (ULTRAM) 50 MG tablet Take 1 tablet (50 mg total) by mouth every 8 (eight) hours as needed. 07/23/22  Yes Larae Grooms, NP  ACCU-CHEK GUIDE test strip USE AS INSTRUCTED 08/19/22   Larae Grooms, NP  acetaminophen (TYLENOL) 500 MG tablet Take 2 tablets (1,000 mg total) by mouth every 6 (six) hours as needed for mild pain. 02/06/21   Henrene Dodge, MD  doxycycline (VIBRA-TABS) 100 MG tablet Take 1 tablet (100 mg total) by mouth 2 (two) times daily. 08/17/22   Larae Grooms, NP  ondansetron (ZOFRAN) 4 MG tablet TAKE 1 TABLET BY MOUTH EVERY 6 HOURS. 04/07/22  Larae Grooms, NP  SODIUM FLUORIDE 5000 PPM 1.1 % PSTE See admin instructions. 05/14/22   [provider]  traZODone (DESYREL) 100 MG tablet Take 1 tablet (100 mg total) by mouth at bedtime as needed. for sleep 02/19/22   Larae Grooms, NP    Allergies as of 08/21/2022 - Review Complete 08/20/2022  Allergen Reaction Noted   Actos [pioglitazone] Nausea And Vomiting 10/02/2014   Metformin and related Diarrhea 10/02/2014   Scopolamine Nausea And Vomiting 01/31/2021    Victoza [liraglutide] Nausea And Vomiting 10/02/2014    Family History  Problem Relation Age of Onset   Diabetes Mother    Heart disease Mother    Hypertension Mother    Heart disease Father    Cancer Brother    Hypertension Brother    Breast cancer Maternal Aunt    Breast cancer Paternal Aunt     Social History   Socioeconomic History   Marital status: Married    Spouse name: Not on file   Number of children: Not on file   Years of education: Not on file   Highest education level: 12th grade  Occupational History   Occupation: retired   Tobacco Use   Smoking status: Never    Passive exposure: Never   Smokeless tobacco: Never  Vaping Use   Vaping Use: Never used  Substance and Sexual Activity   Alcohol use: No   Drug use: No   Sexual activity: Not Currently  Other Topics Concern   Not on file  Social History Narrative   [place]; lives with husband; never smoker; no alcohol; retd. Manufacturing.    Social Determinants of Health   Financial Resource Strain: Low Risk  (09/29/2021)   Overall Financial Resource Strain (CARDIA)    Difficulty of Paying Living Expenses: Not hard at all  Food Insecurity: No Food Insecurity (09/29/2021)   Hunger Vital Sign    Worried About Running Out of Food in the Last Year: Never true    Ran Out of Food in the Last Year: Never true  Transportation Needs: No Transportation Needs (09/29/2021)   PRAPARE - Administrator, Civil Service (Medical): No    Lack of Transportation (Non-Medical): No  Physical Activity: Insufficiently Active (09/29/2021)   Exercise Vital Sign    Days of Exercise per Week: 2 days    Minutes of Exercise per Session: 10 min  Stress: No Stress Concern Present (09/29/2021)   [employer] of Occupational Health - Occupational Stress Questionnaire    Feeling of Stress : Only a little  Social Connections: Moderately Isolated (09/29/2021)   Social Connection and Isolation Panel [NHANES]     Frequency of Communication with Friends and Family: More than three times a week    Frequency of Social Gatherings with Friends and Family: Once a week    Attends Religious Services: More than 4 times per year    Active Member of [employer] or Organizations: No    Attends Banker Meetings: Never    Marital Status: Widowed  Intimate Partner Violence: Not At Risk (09/29/2021)   Humiliation, Afraid, Rape, and Kick questionnaire    Fear of Current or Ex-Partner: No    Emotionally Abused: No    Physically Abused: No    Sexually Abused: No    Review of Systems: See HPI, otherwise negative ROS  Physical Exam: BP 119/70   Pulse 74   Temp (!) 96.6 F (35.9 C) (Temporal)   Resp 20  Ht 5\' 3"  (1.6 m)   Wt 129.7 kg   SpO2 99%   BMI 50.66 kg/m  General:   Alert,  pleasant and cooperative in NAD Head:  Normocephalic and atraumatic. Neck:  Supple; no masses or thyromegaly. Lungs:  Clear throughout to auscultation.    Heart:  Regular rate and rhythm. Abdomen:  Soft, nontender and nondistended. Normal bowel sounds, without guarding, and without rebound.   Neurologic:  Alert and  oriented x4;  grossly normal neurologically.  Impression/Plan: [name] is here for an endoscopy to be performed for epigastric pain  Risks, benefits, limitations, and alternatives regarding  endoscopy have been reviewed with the patient.  Questions have been answered.  All parties agreeable.   Anna Minium, MD  09/15/2022, 10:20 AM

## 2022-09-16 ENCOUNTER — Encounter: Payer: Self-pay | Admitting: Gastroenterology

## 2022-09-16 ENCOUNTER — Telehealth: Payer: Self-pay | Admitting: *Deleted

## 2022-09-16 NOTE — Telephone Encounter (Signed)
Patient called in requesting an appointment with Dr. Kirke Corin. She was in afib post procedure and advised that she needed to be seen. An appointment was offered for Friday but the patient stated that she was leaving for the beach tomorrow. It was advised that she should come in for an appointment this week but she declined.   Heart rate while on the phone was 75. She is currently on Metoprolol Succinate 50 mg once daily. She has been advised that if she develops any shortness of breath, chest pain and/or heart rate sustaining over 100 to be seen at the local hospital or urgent care.   Appointment made for 6/18 with Dr. Mariah Milling.

## 2022-09-16 NOTE — Anesthesia Postprocedure Evaluation (Signed)
Anesthesia Post Note  Patient: Anna Juarez  Procedure(s) Performed: ESOPHAGOGASTRODUODENOSCOPY (EGD) WITH PROPOFOL  Patient location during evaluation: PACU Anesthesia Type: General Level of consciousness: awake and alert Pain management: pain level controlled Vital Signs Assessment: post-procedure vital signs reviewed and stable Respiratory status: spontaneous breathing, nonlabored ventilation and respiratory function stable Cardiovascular status: blood pressure returned to baseline and stable Postop Assessment: no apparent nausea or vomiting Anesthetic complications: no   No notable events documented.   Last Vitals:  Vitals:   09/15/22 1221 09/15/22 1231  BP: 128/69 132/80  Pulse:    Resp:    Temp:    SpO2:      Last Pain:  Vitals:   09/15/22 1301  TempSrc:   PainSc: 0-No pain                 Foye Deer

## 2022-09-16 NOTE — Anesthesia Postprocedure Evaluation (Signed)
Anesthesia Post Note  Patient: [name]  Procedure(s) Performed: ESOPHAGOGASTRODUODENOSCOPY (EGD) WITH PROPOFOL  Patient location during evaluation: PACU Anesthesia Type: General Level of consciousness: awake and alert Pain management: pain level controlled Vital Signs Assessment: post-procedure vital signs reviewed and stable Respiratory status: spontaneous breathing, nonlabored ventilation and respiratory function stable Cardiovascular status: blood pressure returned to baseline and stable Postop Assessment: no apparent nausea or vomiting Anesthetic complications: no Comments: PACU monitor showed atrial fibrillation rate in the 70's. Pt is asymptomatic and other vital signs are stable. She does not recall history of palpitations and is on metoprolol already. Cardiology was called who recommended patient be discharged with close follow-up with cardiology. Pt understands and was given return to hospital precautions including palpitations or shortness of breath or chest pain.    No notable events documented.   Last Vitals:  Vitals:   09/15/22 1221 09/15/22 1231  BP: 128/69 132/80  Pulse:    Resp:    Temp:    SpO2:      Last Pain:  Vitals:   09/15/22 1301  TempSrc:   PainSc: 0-No pain                 Foye Deer

## 2022-09-16 NOTE — Telephone Encounter (Signed)
Ok

## 2022-09-16 NOTE — Addendum Note (Signed)
Addendum  created 09/16/22 0945 by Foye Deer, MD   Clinical Note Signed

## 2022-09-21 DIAGNOSIS — I4891 Unspecified atrial fibrillation: Secondary | ICD-10-CM | POA: Insufficient documentation

## 2022-09-21 NOTE — Progress Notes (Unsigned)
Cardiology Office Note  Date:  09/22/2022   ID:  Anna Juarez, DOB 08-19-1950, MRN [id number]  PCP:  Larae Grooms, NP   Chief Complaint  Patient presents with   New Patient (Initial Visit)    Ref by Dr. Servando Snare for follow up post-op A-Fib; endoscopy. Patient c/o several spells of a stabbing pain in chest with difficulty breathing & bilateral LE edema.  Medications reviewed by the patient verbally.     HPI:  Anna Juarez is a 72 year old woman with past medical history of Hypertension hepatic cirrhosis MGUS diabetes type 2 abdominal pain Presenting by referral from Marlowe Alt for new onset atrial fibrillation  Presenting to the hospital September 15, 2022 for endoscopy Atrial fibrillation documented on EKG September 15, 2022 Prior EKG November 2022 showing normal sinus rhythm  Appreciated palpitations over the past 2 weeks  Also reports having periodic chest pains on left underneath the left breast, sharp pain radiating through to her back and left flank Sx at rest while sitting Several spells Episode yesterday at rest, < , sharp pain under breast  Rib pain on left flank, reproducible on palpation  Uses wheelchair, mobility poor  EKG personally reviewed by myself on todays visit Atrial fibrillation rate 76 bpm unable to exclude old inferior MI  PMH:   has a past medical history of Arthritis, Chronic kidney disease, Complication of anesthesia, Diabetes mellitus without complication (HCC), Extreme obesity, Fatty liver, GERD (gastroesophageal reflux disease), Hyperlipidemia, Hypertension, Palpitations, PONV (postoperative nausea and vomiting), Radiculopathy, cervical, Renal insufficiency, Sleep apnea, and Tear of acetabular labrum (01/2021).  PSH:    Past Surgical History:  Procedure Laterality Date   ABDOMINAL HYSTERECTOMY     CARDIAC CATHETERIZATION     [facility]   CATARACT EXTRACTION W/PHACO Right 06/02/2021   Procedure: CATARACT EXTRACTION PHACO AND INTRAOCULAR LENS  PLACEMENT (IOC) RIGHT DIABETIC;  Surgeon: Nevada Crane, MD;  Location: [facility];  Service: Ophthalmology;  Laterality: Right;  2.52 0:22.8   CATARACT EXTRACTION W/PHACO Left 06/16/2021   Procedure: CATARACT EXTRACTION PHACO AND INTRAOCULAR LENS PLACEMENT (IOC) LEFT DIABETIC 3.51 00:30.9;  Surgeon: Nevada Crane, MD;  Location: [facility];  Service: Ophthalmology;  Laterality: Left;   CERVICAL FUSION     x2   CHOLECYSTECTOMY     COLONOSCOPY     COLONOSCOPY WITH PROPOFOL N/A 09/09/2021   Procedure: COLONOSCOPY WITH PROPOFOL;  Surgeon: [name], MD;  Location: [facility] ENDOSCOPY;  Service: Endoscopy;  Laterality: N/A;   ESOPHAGOGASTRODUODENOSCOPY (EGD) WITH PROPOFOL N/A 09/09/2021   Procedure: ESOPHAGOGASTRODUODENOSCOPY (EGD) WITH PROPOFOL;  Surgeon: [name], MD;  Location: [facility] ENDOSCOPY;  Service: Endoscopy;  Laterality: N/A;   ESOPHAGOGASTRODUODENOSCOPY (EGD) WITH PROPOFOL N/A 09/15/2022   Procedure: ESOPHAGOGASTRODUODENOSCOPY (EGD) WITH PROPOFOL;  Surgeon: [name], MD;  Location: [facility] ENDOSCOPY;  Service: Endoscopy;  Laterality: N/A;   JOINT REPLACEMENT Bilateral    knees    Current Outpatient Medications  Medication Sig Dispense Refill   ACCU-CHEK GUIDE test strip USE AS INSTRUCTED 100 strip 0   acetaminophen (TYLENOL) 500 MG tablet Take 2 tablets (1,000 mg total) by mouth every 6 (six) hours as needed for mild pain.     apixaban (ELIQUIS) 5 MG TABS tablet Take 1 tablet (5 mg total) by mouth 2 (two) times daily. 180 tablet 3   benazepril (LOTENSIN) 40 MG tablet TAKE 1 TABLET BY MOUTH EVERY DAY 90 tablet 1   dapagliflozin propanediol (FARXIGA) 10 MG TABS tablet TAKE 1 TABLET BY MOUTH DAILY BEFORE BREAKFAST.  90 tablet 0   doxycycline (VIBRA-TABS) 100 MG tablet Take 1 tablet (100 mg total) by mouth 2 (two) times daily. 20 tablet 0   linaclotide (LINZESS) 145 MCG CAPS capsule Take 1 capsule (145 mcg total) by mouth daily before breakfast. 30 capsule 0    metoprolol succinate (TOPROL-XL) 50 MG 24 hr tablet TAKE 1 TABLET BY MOUTH DAILY. TAKE WITH OR IMMEDIATELY FOLLOWING A MEAL. 90 tablet 1   ondansetron (ZOFRAN) 4 MG tablet TAKE 1 TABLET BY MOUTH EVERY 6 HOURS. 18 tablet 1   pantoprazole (PROTONIX) 40 MG tablet Take 1 tablet (40 mg total) by mouth 2 (two) times daily before a meal. 180 tablet 1   pregabalin (LYRICA) 150 MG capsule TAKE 1 CAPSULE BY MOUTH 3 TIMES DAILY. 270 capsule 1   SODIUM FLUORIDE 5000 PPM 1.1 % PSTE See admin instructions.     traMADol (ULTRAM) 50 MG tablet Take 1 tablet (50 mg total) by mouth every 8 (eight) hours as needed. 60 tablet 1   traZODone (DESYREL) 100 MG tablet Take 1 tablet (100 mg total) by mouth at bedtime as needed. for sleep 90 tablet 1   No current facility-administered medications for this visit.    Allergies:   Actos [pioglitazone], Metformin and related, Scopolamine, and Victoza [liraglutide]   Social History:  The patient  reports that she has never smoked. She has never been exposed to tobacco smoke. She has never used smokeless tobacco. She reports that she does not drink alcohol and does not use drugs.   Family History:   family history includes Breast cancer in her maternal aunt and paternal aunt; Cancer in her brother; Diabetes in her mother; Heart disease in her father and mother; Hypertension in her brother and mother; Stroke in her daughter; Stroke (age of onset: 47) in her sister.    Review of Systems: Review of Systems  Constitutional: Negative.   HENT: Negative.    Respiratory:  Positive for shortness of breath.   Cardiovascular:  Positive for palpitations.  Gastrointestinal: Negative.   Musculoskeletal: Negative.   Neurological: Negative.   Psychiatric/Behavioral: Negative.    All other systems reviewed and are negative.    PHYSICAL EXAM: VS:  BP 120/64 (BP Location: Right Arm, Patient Position: Sitting, Cuff Size: Large)   Pulse 76   Ht 5\' 2"  (1.575 m)   Wt 290 lb 2 oz  (131.6 kg)   SpO2 96%   BMI 53.06 kg/m  , BMI Body mass index is 53.06 kg/m. GEN: Well nourished, well developed, in no acute distress, obese HEENT: normal Neck: no JVD, carotid bruits, or masses Cardiac: Irregularly irregular; no murmurs, rubs, or gallops,no edema  Respiratory:  clear to auscultation bilaterally, normal work of breathing GI: soft, nontender, nondistended, + BS MS: no deformity or atrophy Skin: warm and dry, no rash Neuro:  Strength and sensation are intact Psych: euthymic mood, full affect  Recent Labs: 08/10/2022: Hemoglobin 14.6; Platelet Count 183 08/26/2022: ALT 10; BUN 22; Creatinine, Ser 1.49; Potassium 4.7; Sodium 141    Lipid Panel Lab Results  Component Value Date   CHOL 194 08/26/2022   HDL 52 08/26/2022   LDLCALC 106 (H) 08/26/2022   TRIG 207 (H) 08/26/2022      Wt Readings from Last 3 Encounters:  09/22/22 290 lb 2 oz (131.6 kg)  09/15/22 286 lb (129.7 kg)  08/26/22 282 lb 3.2 oz (128 kg)       ASSESSMENT AND PLAN:  Problem List Items Addressed This Visit  Cardiology Problems   Atrial fibrillation (HCC) - Primary   Relevant Medications   apixaban (ELIQUIS) 5 MG TABS tablet   Other Relevant Orders   EKG 12-Lead   ECHOCARDIOGRAM COMPLETE   Hypertension associated with diabetes (HCC)   Relevant Medications   apixaban (ELIQUIS) 5 MG TABS tablet   Other Relevant Orders   EKG 12-Lead     Other   OSA (obstructive sleep apnea)   Relevant Orders   EKG 12-Lead   New onset atrial fibrillation Noted she reports after EGD Reports having mild palpitations, exact timing unclear Mild leg swelling, likely exacerbated after recent trip to the beach Recommended she start Eliquis 5 twice daily Echocardiogram ordered In follow-up will discuss options for atrial fibrillation such as rate control versus rhythm control  Left chest pain Atypical in nature, likely rib wall pain, musculoskeletal Presenting at rest, sharp pain radiating  through to her back, reproducible on palpation of left ribs Recommended heat pack, light stretching  Essential hypertension Blood pressure is well controlled on today's visit. No changes made to the medications.  Diabetes type 2 A1c 7.5, low carbohydrate diet recommended   Total encounter time more than 60 minutes  Greater than 50% was spent in counseling and coordination of care with the patient    Signed, Dossie Arbour, M.D., Ph.D. [facility] [place], [place] [phone]

## 2022-09-22 ENCOUNTER — Encounter: Payer: Self-pay | Admitting: Cardiovascular Disease

## 2022-09-22 ENCOUNTER — Ambulatory Visit: Payer: Medicare PPO | Attending: Cardiovascular Disease | Admitting: Cardiovascular Disease

## 2022-09-22 VITALS — BP 120/64 | HR 76 | Ht 62.0 in | Wt 290.1 lb

## 2022-09-22 DIAGNOSIS — I4819 Other persistent atrial fibrillation: Secondary | ICD-10-CM | POA: Diagnosis not present

## 2022-09-22 DIAGNOSIS — Z7984 Long term (current) use of oral hypoglycemic drugs: Secondary | ICD-10-CM | POA: Diagnosis not present

## 2022-09-22 DIAGNOSIS — I152 Hypertension secondary to endocrine disorders: Secondary | ICD-10-CM

## 2022-09-22 DIAGNOSIS — E1159 Type 2 diabetes mellitus with other circulatory complications: Secondary | ICD-10-CM | POA: Diagnosis not present

## 2022-09-22 DIAGNOSIS — G4733 Obstructive sleep apnea (adult) (pediatric): Secondary | ICD-10-CM | POA: Diagnosis not present

## 2022-09-22 MED ORDER — APIXABAN 5 MG PO TABS: 5.0000 mg | ORAL_TABLET | Freq: Two times a day (BID) | ORAL | 3 refills | Status: DC

## 2022-09-22 NOTE — Procedures (Signed)
[facility]  Polysomnogram Report Part I  Phone: [phone] Fax: [phone]  Patient Name: Anna, Juarez. Acquisition Number: [id number]  Date of Birth: 1950-10-14 Acquisition Date: 09/02/2022  Referring Physician: Larae Grooms NP     History: The patient is a 72 year old  . Medical History: arhtritis, CKD, diabetes, fatty liver, GERD, hyperlipidemia, hypertension, palpitations, PONV, OSA, obesity, EDS.  Medications: acetaminophen, benazepril, dapaglifozin propanediol, linzess, metoprolol succinate, ondansetron, pantoprazole, pregabalin, sodium flouride, tramadol, trazodone.  Procedure: This routine overnight polysomnogram was performed on the [facility] 5 using the standard CPAP/ protocol. This included 6 channels of EEG, 2 channels of EOG, chin EMG, bilateral anterior tibialis EMG, nasal/oral thermistor, PTAF (nasal pressure transducer), chest and abdominal wall movements, EKG, and pulse oximetry.  Description: The total recording time was 477.5 minutes. The total sleep time was 240.0 minutes. There were a total of 45.5 minutes of wakefulness after sleep onset for a poorsleep efficiency of 50.3%. The latency to sleep onset was prolonged at 192.0 minutes. The R sleep onset latency was slightly prolonged at 121.5 minutes. Sleep parameters, as a percentage of the total sleep time, demonstrated 3.1% of sleep was in N1 sleep, 86.9% N2, 0.0% N3 and 10.0% R sleep. There were a total of 14 arousals for an arousal index of 3.5 arousals per hour of sleep that was normal.  Overall, there were a total of 9 respiratory events for a respiratory disturbance index, which includes apneas, hypopneas and RERAs (increased respiratory effort) of 2.3 respiratory events per hour of sleep during the pressure titration.  was initiated at 6 cm H2O at lights out, 9:52 p.m. It was titrated in 1-2 cm increments for occasional respiratory events to the final pressure of 11 cm H2O. The apnea was controlled at the  final pressure with the exception of a few non-obstructive hypopneas in REM during elevated mask leak. Supine, REM sleep was observed.  Additionally, the baseline oxygen saturation during wakefulness was 96%, during NREM sleep averaged 93%, and during REM sleep averaged 93%. The total duration of oxygen < 90% was 2.5 minutes.  Cardiac monitoring- atrial fibrillation as observed throughout the study.   Periodic limb movement monitoring- did not demonstrate periodic limb movements.     Impression: This patient's obstructive sleep apnea demonstrated significant improvement with the utilization of nasal  at 11 cm H2O.   Limited EKG monitoring demonstrated atrial fibrillation throughout the study. Cardiac evaluation is suggested if this is a new finding.  Recommendations: Would recommend utilization of nasal  at 11 cm H2O.      An AirFit N20, small mask was used. Chin strap used during study- no. Humidifier used during study- yes.     Yevonne Pax, MD, [facility] Diplomate ABMS-Pulmonary, Critical Care and Sleep Medicine  Electronically reviewed and digitally signed [facility] CPAP/BIPAP Polysomnogram Report Part II Phone: [phone] Fax: [phone]  Patient last name Juarez Neck Size 16.5 in. Acquisition [id number]  Patient first name Anna K. Weight 270.0 lbs. Started 09/02/2022 at 9:46:16 PM  Birth date [birth year] Height 62.0 in. Stopped 09/03/2022 at 5:52:52 AM  Age 49      Type Adult BMI 49.4 lb/in2 Duration 477.5  Christopher S. RPSGT.  Reviewed by: Valentino Hue Henke, PhD, ABSM, FAASM Sleep Data: Lights Out: 9:52:16 PM Sleep Onset: 1:04:16 AM  Lights On: 5:49:46 AM Sleep Efficiency: 50.3 %  Total Recording Time: 477.5 min Sleep Latency (from Lights Off) 192.0 min  Total Sleep Time (TST): 240.0 min  R Latency (from Sleep Onset): 121.5 min  Sleep Period Time: 285.0 min Total number of awakenings: 6  Wake during sleep: 45.0 min Wake After Sleep Onset (WASO): 45.5 min    Sleep Data:         Arousal Summary: Stage  Latency from lights out (min) Latency from sleep onset (min) Duration (min) % Total Sleep Time  Normal values  N 1 192.0 0.0 7.5 3.1 (5%)  N 2 193.0 1.0 208.5 86.9 (50%)  N 3       0.0 0.0 (20%)  R 313.5 121.5 24.0 10.0 (25%)   Number Index  Spontaneous 14 3.5  Apneas & Hypopneas 0 0.0  RERAs 0 0.0       (Apneas & Hypopneas & RERAs)  (0) (0.0)  Limb Movement 0 0.0  Snore 0 0.0  TOTAL 14 3.5     Respiratory Data:  CA OA MA Apnea Hypopnea* A+ H RERA Total  Number 0 0 0 0 9 9 0 9  Mean Dur (sec) 0.0 0.0 0.0 0.0 21.2 21.2 0.0 21.2  Max Dur (sec) 0.0 0.0 0.0 0.0 25.0 25.0 0.0 25.0  Total Dur (min) 0.0 0.0 0.0 0.0 3.2 3.2 0.0 3.2  % of TST 0.0 0.0 0.0 0.0 1.3 1.3 0.0 1.3  Index (#/h TST) 0.0 0.0 0.0 0.0 2.3 2.3 0.0 2.3  *Hypopneas scored based on 4% or greater desaturation.  Sleep Stage:         REM NREM TST  AHI 10.0 1.4 2.3  RDI 10.0 1.4 2.3   Sleep (min) TST (%) REM (min) NREM (min) CA (#) OA (#) MA (#) HYP (#) AHI (#/h) RERA (#) RDI (#/h) Desat (#)  Supine 240.0 100.00 24.0 216.0 0 0 0 9 2.3 0 2.3 17  Non-Supine 0.00 0.00 0.00 0.00 0.00 0.00 0.00 0.00 0.00 0 0.00 0.00     Snoring: Total number of snoring episodes  0  Total time with snoring    min (   % of sleep)   Oximetry Distribution:             WK REM NREM TOTAL  Average (%)   96 93 93 94  < 90% 1.0 0.0 1.5 2.5  < 80% 0.1 0.0 0.0 0.1  < 70% 0.0 0.0 0.0 0.0  # of Desaturations* 1 7 9 17   Desat Index (#/hour) 0.3 17.5 2.5 4.3  Desat Max (%) 7 6 6 7   Desat Max Dur (sec) 25.0 53.0 45.0 53.0  Approx Min O2 during sleep 87  Approx min O2 during a respiratory event 87  Was Oxygen added (Y/N) and final rate :    LPM  *Desaturations based on 4% or greater drop from baseline.   Cheyne Stokes Breathing: None Present    Heart Rate Summary:  Average Heart Rate During Sleep 70.8 bpm      Highest Heart Rate During Sleep (95th %) 80.0 bpm      Highest  Heart Rate During Sleep 132 bpm      Highest Heart Rate During Recording (TIB) 230 bpm (artifact)   Heart Rate Observations: Event Type # Events   Bradycardia 0 Lowest HR Scored: N/A  Sinus Tachycardia During Sleep 0 Highest HR Scored: N/A  Narrow Complex Tachycardia 0 Highest HR Scored: N/A  Wide Complex Tachycardia 0 Highest HR Scored: N/A  Asystole 0 Longest Pause: N/A  Atrial Fibrillation 0 Duration Longest Event: N/A  Other Arrythmias   Type:   Periodic Limb  Movement Data: (Primary legs unless otherwise noted) Total # Limb Movement 0 Limb Movement Index 0.0  Total # PLMS    PLMS Index     Total # PLMS Arousals    PLMS Arousal Index     Percentage Sleep Time with PLMS   min (   % sleep)  Mean Duration limb movements (secs)       IPAP Level (cmH2O) EPAP Level (cmH2O) Total Duration (min) Sleep Duration (min) Sleep (%) REM (%) CA  #) OA # MA # HYP #) AHI (#/hr) RERAs # RERAs (#/hr) RDI (#/hr)  7 7 89.4 84.9 95.0 0.0 0 0 0 4 2.8 0 0.0 2.8  8 8  91.4 84.4 92.3 6.0 0 0 0 2 1.4 0 0.0 1.4  9 9  63.6 30.1 47.3 0.0 0 0 0 0 0.0 0 0.0 0.0  10 10 33.3 33.3 100.0 36.6 0 0 0 2 3.6 0 0.0 3.6  11 11  6.0 6.0 100.0 100.0 0 0 0 1 10.0 0 0.0 10.0

## 2022-09-22 NOTE — Patient Instructions (Addendum)
If legs swell more, call the office We might need a diuretic    Medication Instructions:  Please start eliquis 5 mg twice  day  If you need a refill on your cardiac medications before your next appointment, please call your pharmacy.   Lab work: No new labs needed  Testing/Procedures:  Your physician has requested that you have an echocardiogram. Echocardiography is a painless test that uses sound waves to create images of your heart. It provides your doctor with information about the size and shape of your heart and how well your heart's chambers and valves are working.   You may receive an ultrasound enhancing agent through an IV if needed to better visualize your heart during the echo. This procedure takes approximately one hour.  There are no restrictions for this procedure.  This will take place at [address] [facility], [place] [address]   Follow-Up: At [facility], you and your health needs are our priority.  As part of our continuing mission to provide you with exceptional heart care, we have created designated Provider Care Teams.  These Care Teams include your primary Cardiologist (physician) and Advanced Practice Providers (APPs -  Physician Assistants and Nurse Practitioners) who all work together to provide you with the care you need, when you need it.  You will need a follow up appointment in 2 months  Providers on your designated Care Team:   Nicolasa Ducking, NP Eula Listen, PA-C Cadence Fransico Michael, [place]  COVID-19 Vaccine Information can be found at: [email or web address] For questions related to vaccine distribution or appointments, please email [email or web address] or call [phone].

## 2022-09-23 ENCOUNTER — Other Ambulatory Visit: Payer: Self-pay | Admitting: Nurse Practitioner

## 2022-09-23 DIAGNOSIS — G8929 Other chronic pain: Secondary | ICD-10-CM

## 2022-09-23 NOTE — Telephone Encounter (Signed)
Requested medication (s) are due for refill today: Yes  Requested medication (s) are on the active medication list: Yes  Last refill:  07/23/22  Future visit scheduled: Yes  Notes to clinic:  Unable to refill per protocol, cannot delegate.      Requested Prescriptions  Pending Prescriptions Disp Refills   traMADol (ULTRAM) 50 MG tablet [Pharmacy Med Name: TRAMADOL HCL 50 MG TABLET] 60 tablet 1    Sig: TAKE 1 TABLET BY MOUTH EVERY 8 HOURS AS NEEDED.     Not Delegated - Analgesics:  Opioid Agonists Failed - 09/23/2022  1:09 PM      Failed - This refill cannot be delegated      Failed - Urine Drug Screen completed in last 360 days      Passed - Valid encounter within last 3 months    Recent Outpatient Visits           4 weeks ago Hypertension associated with diabetes [facility])   [place] [facility] Larae Grooms, NP   2 months ago Acute intractable headache, unspecified headache type   [place] [facility] Larae Grooms, NP   3 months ago Constipation, unspecified constipation type   [facility] Larae Grooms, NP   4 months ago Hypertension associated with diabetes [facility])   [place] [facility] Larae Grooms, NP   7 months ago Stage 3b chronic kidney disease [facility])   [place] [facility] Larae Grooms, NP       Future Appointments             In 2 months Laural Benes, Oralia Rud, DO [place] [facility]   In 2 months Gollan, Tollie Pizza, MD [facility]

## 2022-09-29 ENCOUNTER — Encounter (INDEPENDENT_AMBULATORY_CARE_PROVIDER_SITE_OTHER): Payer: Medicare PPO | Admitting: Internal Medicine

## 2022-09-29 DIAGNOSIS — G4733 Obstructive sleep apnea (adult) (pediatric): Secondary | ICD-10-CM | POA: Diagnosis not present

## 2022-09-29 DIAGNOSIS — G4719 Other hypersomnia: Secondary | ICD-10-CM

## 2022-10-05 ENCOUNTER — Ambulatory Visit (INDEPENDENT_AMBULATORY_CARE_PROVIDER_SITE_OTHER): Payer: Medicare PPO

## 2022-10-05 VITALS — Ht 62.0 in | Wt 290.0 lb

## 2022-10-05 DIAGNOSIS — Z0000 Encounter for general adult medical examination without abnormal findings: Secondary | ICD-10-CM | POA: Diagnosis not present

## 2022-10-05 NOTE — Patient Instructions (Signed)
Anna Juarez , Thank you for taking time to come for your Medicare Wellness Visit. I appreciate your ongoing commitment to your health goals. Please review the following plan we discussed and let me know if I can assist you in the future.   These are the goals we discussed:  Goals      DIET - EAT MORE FRUITS AND VEGETABLES     DIET - INCREASE WATER INTAKE     Recommend drinking at least 6-8 glasses of water a day      Manage Chronic Pain     Timeframe:  Long-Range Goal Priority:  Medium Start Date:                             Expected End Date:                       Follow Up Date 2 month follow up   - call for medicine refill 2 or 3 days before it runs out - develop a personal pain management plan - track times pain is worst and when it is best - use ice or heat for pain relief    Why is this important?   Day-to-day life can be hard when you have chronic pain.  Pain medicine is just one piece of the treatment puzzle.  You can try these action steps to help you manage your pain.    Notes:      Monitor and Manage My Blood Sugar-Diabetes Type 2     Timeframe:  Long-Range Goal Priority:  High Start Date:                             Expected End Date:                       Follow Up Date 2 month follow up   - check blood sugar at prescribed times - enter blood sugar readings and medication or insulin into daily log - take the blood sugar log to all doctor visits - take the blood sugar meter to all doctor visits    Why is this important?   Checking your blood sugar at home helps to keep it from getting very high or very low.  Writing the results in a diary or log helps the doctor know how to care for you.  Your blood sugar log should have the time, date and the results.  Also, write down the amount of insulin or other medicine that you take.  Other information, like what you ate, exercise done and how you were feeling, will also be helpful.     Notes:      Obtain Eye  Exam-Diabetes Type 2     Follow Up Date 2 month follow up   - schedule appointment with eye doctor    Why is this important?   Eye check-ups are important when you have diabetes.  Vision loss can be prevented.    Notes:      Patient Stated     08/05/2020, no goals     Patient Stated     Would like to be able to stand longer     Perform Foot Care-Diabetes Type 2     Timeframe:  Long-Range Goal Priority:  Medium Start Date:  Expected End Date:                       Follow Up Date 2 month follow up   - check feet daily for cuts, sores or redness - keep feet up while sitting - trim toenails straight across - wear comfortable, well-fitting shoes    Why is this important?   Good foot care is very important when you have diabetes.  There are many things you can do to keep your feet healthy and catch a problem early.    Notes:         This is a list of the screening recommended for you and due dates:  Health Maintenance  Topic Date Due   COVID-19 Vaccine (4 - 2023-24 season) 12/05/2021   Flu Shot  11/05/2022   Mammogram  12/19/2022   Complete foot exam   01/20/2023   Hemoglobin A1C  02/26/2023   Yearly kidney health urinalysis for diabetes  05/26/2023   Eye exam for diabetics  08/11/2023   Yearly kidney function blood test for diabetes  08/26/2023   DTaP/Tdap/Td vaccine (2 - Td or Tdap) 09/14/2023   Medicare Annual Wellness Visit  10/05/2023   Colon Cancer Screening  09/10/2031   Pneumonia Vaccine  Completed   DEXA scan (bone density measurement)  Completed   Hepatitis C Screening  Completed   Zoster (Shingles) Vaccine  Completed   HPV Vaccine  Aged Out    Advanced directives: no  Conditions/risks identified: none  Next appointment: Follow up in one year for your annual wellness visit 10/11/23 @ 10:45 am by phone   Preventive Care 65 Years and Older, Female Preventive care refers to lifestyle choices and visits with your health care  provider that can promote health and wellness. What does preventive care include? A yearly physical exam. This is also called an annual well check. Dental exams once or twice a year. Routine eye exams. Ask your health care provider how often you should have your eyes checked. Personal lifestyle choices, including: Daily care of your teeth and gums. Regular physical activity. Eating a healthy diet. Avoiding tobacco and drug use. Limiting alcohol use. Practicing safe sex. Taking low-dose aspirin every day. Taking vitamin and mineral supplements as recommended by your health care provider. What happens during an annual well check? The services and screenings done by your health care provider during your annual well check will depend on your age, overall health, lifestyle risk factors, and family history of disease. Counseling  Your health care provider may ask you questions about your: Alcohol use. Tobacco use. Drug use. Emotional well-being. Home and relationship well-being. Sexual activity. Eating habits. History of falls. Memory and ability to understand (cognition). Work and work Astronomer. Reproductive health. Screening  You may have the following tests or measurements: Height, weight, and BMI. Blood pressure. Lipid and cholesterol levels. These may be checked every 5 years, or more frequently if you are over 9 years old. Skin check. Lung cancer screening. You may have this screening every year starting at age 53 if you have a 30-pack-year history of smoking and currently smoke or have quit within the past 15 years. Fecal occult blood test (FOBT) of the stool. You may have this test every year starting at age 85. Flexible sigmoidoscopy or colonoscopy. You may have a sigmoidoscopy every 5 years or a colonoscopy every 10 years starting at age 49. Hepatitis C blood test. Hepatitis B blood test. Sexually transmitted disease (  STD) testing. Diabetes screening. This is done by  checking your blood sugar (glucose) after you have not eaten for a while (fasting). You may have this done every 1-3 years. Bone density scan. This is done to screen for osteoporosis. You may have this done starting at age 78. Mammogram. This may be done every 1-2 years. Talk to your health care provider about how often you should have regular mammograms. Talk with your health care provider about your test results, treatment options, and if necessary, the need for more tests. Vaccines  Your health care provider may recommend certain vaccines, such as: Influenza vaccine. This is recommended every year. Tetanus, diphtheria, and acellular pertussis (Tdap, Td) vaccine. You may need a Td booster every 10 years. Zoster vaccine. You may need this after age 46. Pneumococcal 13-valent conjugate (PCV13) vaccine. One dose is recommended after age 84. Pneumococcal polysaccharide (PPSV23) vaccine. One dose is recommended after age 19. Talk to your health care provider about which screenings and vaccines you need and how often you need them. This information is not intended to replace advice given to you by your health care provider. Make sure you discuss any questions you have with your health care provider. Document Released: 04/19/2015 Document Revised: 12/11/2015 Document Reviewed: 01/22/2015 Elsevier Interactive Patient Education  2017 [employer].  Fall Prevention in the Home Falls can cause injuries. They can happen to people of all ages. There are many things you can do to make your home safe and to help prevent falls. What can I do on the outside of my home? Regularly fix the edges of walkways and driveways and fix any cracks. Remove anything that might make you trip as you walk through a door, such as a raised step or threshold. Trim any bushes or trees on the path to your home. Use bright outdoor lighting. Clear any walking paths of anything that might make someone trip, such as rocks or  tools. Regularly check to see if handrails are loose or broken. Make sure that both sides of any steps have handrails. Any raised decks and porches should have guardrails on the edges. Have any leaves, snow, or ice cleared regularly. Use sand or salt on walking paths during winter. Clean up any spills in your garage right away. This includes oil or grease spills. What can I do in the bathroom? Use night lights. Install grab bars by the toilet and in the tub and shower. Do not use towel bars as grab bars. Use non-skid mats or decals in the tub or shower. If you need to sit down in the shower, use a plastic, non-slip stool. Keep the floor dry. Clean up any water that spills on the floor as soon as it happens. Remove soap buildup in the tub or shower regularly. Attach bath mats securely with double-sided non-slip rug tape. Do not have throw rugs and other things on the floor that can make you trip. What can I do in the bedroom? Use night lights. Make sure that you have a light by your bed that is easy to reach. Do not use any sheets or blankets that are too big for your bed. They should not hang down onto the floor. Have a firm chair that has side arms. You can use this for support while you get dressed. Do not have throw rugs and other things on the floor that can make you trip. What can I do in the kitchen? Clean up any spills right away. Avoid walking on  wet floors. Keep items that you use a lot in easy-to-reach places. If you need to reach something above you, use a strong step stool that has a grab bar. Keep electrical cords out of the way. Do not use floor polish or wax that makes floors slippery. If you must use wax, use non-skid floor wax. Do not have throw rugs and other things on the floor that can make you trip. What can I do with my stairs? Do not leave any items on the stairs. Make sure that there are handrails on both sides of the stairs and use them. Fix handrails that are  broken or loose. Make sure that handrails are as long as the stairways. Check any carpeting to make sure that it is firmly attached to the stairs. Fix any carpet that is loose or worn. Avoid having throw rugs at the top or bottom of the stairs. If you do have throw rugs, attach them to the floor with carpet tape. Make sure that you have a light switch at the top of the stairs and the bottom of the stairs. If you do not have them, ask someone to add them for you. What else can I do to help prevent falls? Wear shoes that: Do not have high heels. Have rubber bottoms. Are comfortable and fit you well. Are closed at the toe. Do not wear sandals. If you use a stepladder: Make sure that it is fully opened. Do not climb a closed stepladder. Make sure that both sides of the stepladder are locked into place. Ask someone to hold it for you, if possible. Clearly mark and make sure that you can see: Any grab bars or handrails. First and last steps. Where the edge of each step is. Use tools that help you move around (mobility aids) if they are needed. These include: Canes. Walkers. Scooters. Crutches. Turn on the lights when you go into a dark area. Replace any light bulbs as soon as they burn out. Set up your furniture so you have a clear path. Avoid moving your furniture around. If any of your floors are uneven, fix them. If there are any pets around you, be aware of where they are. Review your medicines with your doctor. Some medicines can make you feel dizzy. This can increase your chance of falling. Ask your doctor what other things that you can do to help prevent falls. This information is not intended to replace advice given to you by your health care provider. Make sure you discuss any questions you have with your health care provider. Document Released: 01/17/2009 Document Revised: 08/29/2015 Document Reviewed: 04/27/2014 Elsevier Interactive Patient Education  2017 [employer].

## 2022-10-05 NOTE — Progress Notes (Signed)
Subjective:   Anna Juarez is a 72 y.o. female who presents for Medicare Annual (Subsequent) preventive examination.  Visit Complete: Virtual  I connected with  Anna Juarez on 10/05/22 by a audio enabled telemedicine application and verified that I am speaking with the correct person using two identifiers.  Patient Location: Home  Provider Location: Office/Clinic  I discussed the limitations of evaluation and management by telemedicine. The patient expressed understanding and agreed to proceed.   Review of Systems     Cardiac Risk Factors include: advanced age (>65men, >3 women);diabetes mellitus;family history of premature cardiovascular disease;hypertension;obesity (BMI >30kg/m2);sedentary lifestyle;dyslipidemia     Objective:    Today's Vitals   10/05/22 1320  Weight: 290 lb (131.5 kg)  Height: 5\' 2"  (1.575 m)   Body mass index is 53.04 kg/m.     10/05/2022    1:10 PM 09/15/2022    9:37 AM 01/27/2022    3:28 PM 09/29/2021   10:30 AM 06/02/2021    6:34 AM 02/06/2021    6:43 AM 02/05/2021    9:40 AM  Advanced Directives  Does Patient Have a Medical Advance Directive? No Yes Yes Yes Yes No Yes  Type of Advance Directive   Living will;Healthcare Power of [employer] Power of [employer] Power of [place];Living will    Does patient want to make changes to medical advance directive?     No - Patient declined    Copy of Healthcare Power of Attorney in Chart?    No - copy requested No - copy requested    Would patient like information on creating a medical advance directive? No - Patient declined     No - Patient declined     Current Medications (verified) Outpatient Encounter Medications as of 10/05/2022  Medication Sig   ACCU-CHEK GUIDE test strip USE AS INSTRUCTED   acetaminophen (TYLENOL) 500 MG tablet Take 2 tablets (1,000 mg total) by mouth every 6 (six) hours as needed for mild pain.   apixaban (ELIQUIS) 5 MG TABS tablet Take 1 tablet (5 mg total)  by mouth 2 (two) times daily.   benazepril (LOTENSIN) 40 MG tablet TAKE 1 TABLET BY MOUTH EVERY DAY   dapagliflozin propanediol (FARXIGA) 10 MG TABS tablet TAKE 1 TABLET BY MOUTH DAILY BEFORE BREAKFAST.   linaclotide (LINZESS) 145 MCG CAPS capsule Take 1 capsule (145 mcg total) by mouth daily before breakfast.   metoprolol succinate (TOPROL-XL) 50 MG 24 hr tablet TAKE 1 TABLET BY MOUTH DAILY. TAKE WITH OR IMMEDIATELY FOLLOWING A MEAL.   ondansetron (ZOFRAN) 4 MG tablet TAKE 1 TABLET BY MOUTH EVERY 6 HOURS.   pantoprazole (PROTONIX) 40 MG tablet Take 1 tablet (40 mg total) by mouth 2 (two) times daily before a meal.   pregabalin (LYRICA) 150 MG capsule TAKE 1 CAPSULE BY MOUTH 3 TIMES DAILY.   SODIUM FLUORIDE 5000 PPM 1.1 % PSTE See admin instructions.   traMADol (ULTRAM) 50 MG tablet TAKE 1 TABLET BY MOUTH EVERY 8 HOURS AS NEEDED.   traZODone (DESYREL) 100 MG tablet Take 1 tablet (100 mg total) by mouth at bedtime as needed. for sleep   doxycycline (VIBRA-TABS) 100 MG tablet Take 1 tablet (100 mg total) by mouth 2 (two) times daily. (Patient not taking: Reported on 10/05/2022)   No facility-administered encounter medications on file as of 10/05/2022.    Allergies (verified) Actos [pioglitazone], Metformin and related, Scopolamine, and Victoza [liraglutide]   History: Past Medical History:  Diagnosis Date   Arthritis  Chronic kidney disease    Complication of anesthesia    Diabetes mellitus without complication (HCC)    Extreme obesity    Fatty liver    GERD (gastroesophageal reflux disease)    Hyperlipidemia    Hypertension    Palpitations    PONV (postoperative nausea and vomiting)    Radiculopathy, cervical    Renal insufficiency    stage 3   Sleep apnea    uses cpap   Tear of acetabular labrum 01/2021   right   Past Surgical History:  Procedure Laterality Date   ABDOMINAL HYSTERECTOMY     CARDIAC CATHETERIZATION     [facility]   CATARACT EXTRACTION W/PHACO Right 06/02/2021    Procedure: CATARACT EXTRACTION PHACO AND INTRAOCULAR LENS PLACEMENT (IOC) RIGHT DIABETIC;  Surgeon: Nevada Crane, MD;  Location: [facility];  Service: Ophthalmology;  Laterality: Right;  2.52 0:22.8   CATARACT EXTRACTION W/PHACO Left 06/16/2021   Procedure: CATARACT EXTRACTION PHACO AND INTRAOCULAR LENS PLACEMENT (IOC) LEFT DIABETIC 3.51 00:30.9;  Surgeon: Nevada Crane, MD;  Location: [facility];  Service: Ophthalmology;  Laterality: Left;   CERVICAL FUSION     x2   CHOLECYSTECTOMY     COLONOSCOPY     COLONOSCOPY WITH PROPOFOL N/A 09/09/2021   Procedure: COLONOSCOPY WITH PROPOFOL;  Surgeon: [name], MD;  Location: [facility];  Service: Endoscopy;  Laterality: N/A;   ESOPHAGOGASTRODUODENOSCOPY (EGD) WITH PROPOFOL N/A 09/09/2021   Procedure: ESOPHAGOGASTRODUODENOSCOPY (EGD) WITH PROPOFOL;  Surgeon: [name], MD;  Location: [facility] ENDOSCOPY;  Service: Endoscopy;  Laterality: N/A;   ESOPHAGOGASTRODUODENOSCOPY (EGD) WITH PROPOFOL N/A 09/15/2022   Procedure: ESOPHAGOGASTRODUODENOSCOPY (EGD) WITH PROPOFOL;  Surgeon: [name], MD;  Location: [facility] ENDOSCOPY;  Service: Endoscopy;  Laterality: N/A;   JOINT REPLACEMENT Bilateral    knees   Family History  Problem Relation Age of Onset   Diabetes Mother    Heart disease Mother    Hypertension Mother    Heart disease Father    Stroke Sister 55   Cancer Brother    Hypertension Brother    Breast cancer Maternal Aunt    Breast cancer Paternal Aunt    Stroke Daughter    Social History   Socioeconomic History   Marital status: Married    Spouse name: Not on file   Number of children: Not on file   Years of education: Not on file   Highest education level: 12th grade  Occupational History   Occupation: retired   Tobacco Use   Smoking status: Never    Passive exposure: Never   Smokeless tobacco: Never  Vaping Use   Vaping Use: Never used  Substance and Sexual Activity   Alcohol use: No   Drug  use: No   Sexual activity: Not Currently  Other Topics Concern   Not on file  Social History Narrative   [place]; lives with husband; never smoker; no alcohol; retd. Manufacturing.    Social Determinants of Health   Financial Resource Strain: Low Risk  (10/05/2022)   Overall Financial Resource Strain (CARDIA)    Difficulty of Paying Living Expenses: Not hard at all  Food Insecurity: No Food Insecurity (10/05/2022)   Hunger Vital Sign    Worried About Running Out of Food in the Last Year: Never true    Ran Out of Food in the Last Year: Never true  Transportation Needs: No Transportation Needs (10/05/2022)   PRAPARE - Administrator, Civil Service (Medical): No  Lack of Transportation (Non-Medical): No  Physical Activity: Inactive (10/05/2022)   Exercise Vital Sign    Days of Exercise per Week: 0 days    Minutes of Exercise per Session: 0 min  Stress: No Stress Concern Present (10/05/2022)   [employer] of Occupational Health - Occupational Stress Questionnaire    Feeling of Stress : Not at all  Social Connections: Moderately Isolated (10/05/2022)   Social Connection and Isolation Panel [NHANES]    Frequency of Communication with Friends and Family: More than three times a week    Frequency of Social Gatherings with Friends and Family: Once a week    Attends Religious Services: More than 4 times per year    Active Member of [employer] or Organizations: No    Attends Banker Meetings: Never    Marital Status: Widowed    Tobacco Counseling Counseling given: Not Answered   Clinical Intake:  Pre-visit preparation completed: Yes  Pain : No/denies pain     Nutritional Risks: None Diabetes: No  How often do you need to have someone help you when you read instructions, pamphlets, or other written materials from your doctor or pharmacy?: 1 - Never  Interpreter Needed?: No  Information entered by :: Kennedy Bucker, LPN   Activities of Daily  Living    10/05/2022    1:10 PM  In your present state of health, do you have any difficulty performing the following activities:  Hearing? 0  Vision? 0  Difficulty concentrating or making decisions? 0  Walking or climbing stairs? 0  Dressing or bathing? 0  Doing errands, shopping? 0  Preparing Food and eating ? N  Using the Toilet? N  In the past six months, have you accidently leaked urine? N  Do you have problems with loss of bowel control? N  Managing your Medications? N  Managing your Finances? N  Housekeeping or managing your Housekeeping? N    Patient Care Team: Larae Grooms, NP as PCP - General [name], MD as Consulting Physician (Gastroenterology) Teryl Lucy, MD as Consulting Physician (Orthopedic Surgery) Dahlia Byes, [facility] (Inactive) (Pharmacist)  Indicate any recent Medical Services you may have received from other than [facility] providers in the past year (date may be approximate).     Assessment:   This is a routine wellness examination for Makaiah.  Hearing/Vision screen Hearing Screening - Comments:: No aids Vision Screening - Comments:: Wears glasses- [facility]  Dietary issues and exercise activities discussed:     Goals Addressed             This Visit's Progress    DIET - EAT MORE FRUITS AND VEGETABLES         Depression Screen    10/05/2022    1:08 PM 08/26/2022    2:56 PM 05/25/2022    2:14 PM 02/19/2022    1:40 PM 01/19/2022    1:26 PM 01/06/2022    4:16 PM 12/18/2021   10:57 AM  PHQ 2/9 Scores  PHQ - 2 Score 0 2 1 0 0 1 0  PHQ- 9 Score 0 4 10 0 5 4 6     Fall Risk    10/05/2022    1:10 PM 08/26/2022    2:55 PM 05/25/2022    2:14 PM 02/19/2022    1:40 PM 01/19/2022    1:25 PM  Fall Risk   Falls in the past year? 1 1 0 0 0  Number falls in past yr: 0 0 0 0 0  Injury with Fall? 0 0 0 0 0  Risk for fall due to : History of fall(s) No Fall Risks No Fall Risks No Fall Risks No Fall Risks  Follow up Falls evaluation  completed;Falls prevention discussed Falls evaluation completed Falls evaluation completed Falls evaluation completed Falls evaluation completed    MEDICARE RISK AT HOME:  Medicare Risk at Home - 10/05/22 1311     Any stairs in or around the home? Yes    If so, are there any without handrails? No    Home free of loose throw rugs in walkways, pet beds, electrical cords, etc? Yes    Adequate lighting in your home to reduce risk of falls? Yes    Life alert? No    Use of a cane, walker or w/c? No    Grab bars in the bathroom? Yes    Shower chair or bench in shower? Yes    Elevated toilet seat or a handicapped toilet? No             TIMED UP AND GO:  Was the test performed?  No    Cognitive Function:        10/05/2022    1:12 PM 09/29/2021   10:23 AM 08/05/2020    1:49 PM 01/03/2018    2:11 PM  6CIT Screen  What Year? 0 points 0 points 0 points 0 points  What month? 0 points 0 points 0 points 0 points  What time? 0 points 0 points 0 points 0 points  Count back from 20 2 points 0 points 0 points 0 points  Months in reverse 2 points 2 points 2 points 0 points  Repeat phrase 8 points 2 points 8 points 0 points  Total Score 12 points 4 points 10 points 0 points    Immunizations Immunization History  Administered Date(s) Administered   Fluad Quad(high Dose 65+) 01/24/2021, 01/19/2022   Hepatitis A, Adult 02/26/2021   Hepatitis B, ADULT 02/26/2021, 03/27/2021   Influenza, High Dose Seasonal PF 01/07/2016, 01/19/2017, 01/03/2018, 01/13/2019   Influenza,inj,Quad PF,6+ Mos 01/02/2015   Influenza-Unspecified 02/28/2014, 01/19/2017, 02/05/2020   Moderna Sars-Covid-2 Vaccination 04/21/2019, 05/19/2019, 02/19/2020   Pneumococcal Conjugate-13 02/28/2014   Pneumococcal Polysaccharide-23 10/31/2015   Pneumococcal-Unspecified 11/14/2004   Tdap 09/13/2013   Zoster Recombinant(Shingrix) 04/02/2021, 07/22/2021    TDAP status: Up to date  Flu Vaccine status: Up to date  Pneumococcal  vaccine status: Up to date  Covid-19 vaccine status: Completed vaccines  Qualifies for Shingles Vaccine? Yes   Zostavax completed No   Shingrix Completed?: Yes  Screening Tests Health Maintenance  Topic Date Due   COVID-19 Vaccine (4 - 2023-24 season) 12/05/2021   INFLUENZA VACCINE  11/05/2022   MAMMOGRAM  12/19/2022   FOOT EXAM  01/20/2023   HEMOGLOBIN A1C  02/26/2023   Diabetic kidney evaluation - Urine ACR  05/26/2023   OPHTHALMOLOGY EXAM  08/11/2023   Diabetic kidney evaluation - eGFR measurement  08/26/2023   DTaP/Tdap/Td (2 - Td or Tdap) 09/14/2023   Medicare Annual Wellness (AWV)  10/05/2023   Colonoscopy  09/10/2031   Pneumonia Vaccine 47+ Years old  Completed   DEXA SCAN  Completed   Hepatitis C Screening  Completed   Zoster Vaccines- Shingrix  Completed   HPV VACCINES  Aged Out    Health Maintenance  Health Maintenance Due  Topic Date Due   COVID-19 Vaccine (4 - 2023-24 season) 12/05/2021    Colorectal cancer screening: Type of screening: Colonoscopy. Completed 09/09/21. Repeat every  10 years  Mammogram status: Completed 12/18/20. Repeat every year- declined referral  Bone Density status: Completed 12/18/20. Results reflect: Bone density results: NORMAL. Repeat every 5 years.  Lung Cancer Screening: (Low Dose CT Chest recommended if Age 53-80 years, 20 pack-year currently smoking OR have quit w/in 15years.) does not qualify.    Additional Screening:  Hepatitis C Screening: does qualify; Completed 01/20/21  Vision Screening: Recommended annual ophthalmology exams for early detection of glaucoma and other disorders of the eye. Is the patient up to date with their annual eye exam?  Yes  Who is the provider or what is the name of the office in which the patient attends annual eye exams? [facility]  If pt is not established with a provider, would they like to be referred to a provider to establish care? No .   Dental Screening: Recommended annual dental exams  for proper oral hygiene  Diabetic Foot Exam: Diabetic Foot Exam: Completed 01/19/22  Community Resource Referral / Chronic Care Management: CRR required this visit?  No   CCM required this visit?  No     Plan:     I have personally reviewed and noted the following in the patient's chart:   Medical and social history Use of alcohol, tobacco or illicit drugs  Current medications and supplements including opioid prescriptions. Patient is not currently taking opioid prescriptions. Functional ability and status Nutritional status Physical activity Advanced directives List of other physicians Hospitalizations, surgeries, and ER visits in previous 12 months Vitals Screenings to include cognitive, depression, and falls Referrals and appointments  In addition, I have reviewed and discussed with patient certain preventive protocols, quality metrics, and best practice recommendations. A written personalized care plan for preventive services as well as general preventive health recommendations were provided to patient.     Hal Hope, LPN   0/12/8117   After Visit Summary: (MyChart) Due to this being a telephonic visit, the after visit summary with patients personalized plan was offered to patient via MyChart   Nurse Notes: none

## 2022-10-06 ENCOUNTER — Ambulatory Visit: Payer: Medicare PPO | Attending: Cardiovascular Disease

## 2022-10-06 DIAGNOSIS — I4819 Other persistent atrial fibrillation: Secondary | ICD-10-CM | POA: Diagnosis not present

## 2022-10-06 LAB — ECHOCARDIOGRAM COMPLETE

## 2022-10-09 NOTE — Procedures (Signed)
[facility]  Polysomnogram Report Part I                                                               Phone: [phone] Fax: [phone]  Patient Name: Anna Juarez, Anna Juarez. Acquisition Number: [id number]  Date of Birth: October 13, 1950 Acquisition Date: 09/29/2022  Referring Physician: Clydie Braun L. Caren Griffins, NP     History: The patient is a 72 year old  who was referred for evaluation of . Medical History: arthritis, CKD, diabetes, fatty liver, GERD, hyperlipidemia, hypertension, palpitations, PONV, OSA, obesity and EDS.  Medications: acetaminophen, benazepril, dapaglifozin, propanediol, Linzess, metopolol succinate, ondansetron, pantoprazole, pregabalin, sodium flouride, tramadol and trazodone.  Procedure: This routine overnight polysomnogram was performed on the [facility] 5 using the standard diagnostic protocol. This included 6 channels of EEG, 2 channels of EOG, chin EMG, bilateral anterior tibialis EMG, nasal/oral thermistor, PTAF (nasal pressure transducer), chest and abdominal wall movements, EKG, and pulse oximetry.  Description: The total recording time was 397.5 minutes. The total sleep time was 313.5 minutes. There were a total of 18.7 minutes of wakefulness after sleep onset for areducedsleep efficiency of 78.9%. The latency to sleep onset was prolonged at 65.3 minutes. The R sleep onset latency wasshort at 52.0 minutes. Sleep parameters, as a percentage of the total sleep time, demonstrated 22.6% of sleep was in N1 sleep, 59.0% N2, 0.0% N3 and 18.3% R sleep. There were a total of 113 arousals for an arousal index of 21.6 arousals per hour of sleep that was elevated.  Respiratory monitoring demonstrated   snoring . There were 34 apneas and hypopneas for an Apnea Hypopnea Index of 6.5 apneas and hypopneas per hour of sleep. The REM related apnea hypopnea index was 17.7/hr of REM sleep compared to a NREM AHI of 4.0/hr. The Respiratory Disturbance Index, which includes 40 respiratory effort  related arousals (RERAs), was 14.2 respiratory events per hour of sleep.  The average duration of the respiratory events was 34.3 seconds with a maximum duration of 85.5 seconds. The respiratory events occurred in all positions. The respiratory events were associated with peripheral oxygen desaturations on the average to 92%. The lowest oxygen desaturation associated with a respiratory event was 77%. Additionally, the baseline oxygen saturation during wakefulness was 98%, during NREM sleep averaged 96%, and during REM sleep averaged  95%. The total duration of oxygen < 90% was 2.6 minutes.  Cardiac monitoring-  demonstrate transient cardiac decelerations associated with the apneas.  significant cardiac rhythm irregularities.   Periodic limb movement monitoring- did not demonstrate periodic limb movements.    Impression: This routine overnight polysomnogram demonstrated significant, REM-related obstructive sleep apnea with an overall Apnea Hypopnea Index of 6.5 apneas and hypopneas per hour of sleep, which increased to 17.7 in REM sleep. The lowest desaturation was to 77%.    reduced sleep efficiency with anelevated arousal index,increased awakeningsand no slow wave sleep. These findings would appear to be due to the obstructive sleep apnea.  Recommendations:    A CPAP titration would be recommended for the sleep apnea.  Would recommend weight loss in a patient with a BMI of 49.4.  Alternative treatment options may include an oral appliance, a nasal resistance device, or ENT surgery in the appropriate clinical context.     [name]  Crista Luria, MD, [facility] Diplomate ABMS-Pulmonary, Critical Care and Sleep Medicine  Electronically reviewed and digitally signed   [facility] Polysomnogram Report Part II  Phone: [phone] Fax: [phone]  Patient last name [place] Size 16.5 in. Acquisition [phone]  Patient first name [name] Weight 270.0 lbs. Started 09/29/2022 at 11:41:39 PM   Birth date [birth year] Height 62.0 in. Stopped 09/30/2022 at 7:40:21 AM  Age 61 BMI 49.4 lb/in2 Duration 397.5  Study Type Adult      Report generated by Otho Perl, RPSGT  Reviewed by: Valentino Hue. Henke, PhD, ABSM, FAASM Sleep Data: Lights Out: 11:49:51 PM Sleep Onset: 12:55:09 AM  Lights On: 6:27:21 AM Sleep Efficiency: 78.9 %  Total Recording Time: 397.5 min Sleep Latency (from Lights Off) 65.3 min  Total Sleep Time (TST): 313.5 min R Latency (from Sleep Onset): 52.0 min  Sleep Period Time: 332.0 min Total number of awakenings: 17  Wake during sleep: 18.5 min Wake After Sleep Onset (WASO): 18.7 min   Sleep Data:         Arousal Summary: Stage  Latency from lights out (min) Latency from sleep onset (min) Duration (min) % Total Sleep Time  Normal values  N 1 65.3 0.0 71.0 22.6 (5%)  N 2 69.3 4.0 185.0 59.0 (50%)  N 3       0.0 0.0 (20%)  R 117.3 52.0 57.5 18.3 (25%)   Number Index  Spontaneous 83 15.9  Apneas & Hypopneas 25 4.8  RERAs 40 7.7       (Apneas & Hypopneas & RERAs)  (65) (12.4)  Limb Movement 5 1.0  Snore 0 0.0  TOTAL 153 29.3     Respiratory Data:  CA OA MA Apnea Hypopnea* A+ H RERA Total  Number 0 5 0 5 29 34 40 74  Mean Dur (sec) 0.0 19.2 0.0 19.2 33.8 31.7 36.5 34.3  Max Dur (sec) 0.0 21.5 0.0 21.5 85.5 85.5 68.0 85.5  Total Dur (min) 0.0 1.6 0.0 1.6 16.4 17.9 24.3 42.3  % of TST 0.0 0.5 0.0 0.5 5.2 5.7 7.8 13.5  Index (#/h TST) 0.0 1.0 0.0 1.0 5.6 6.5 7.7 14.2  *Hypopneas scored based on 4% or greater desaturation.  Sleep Stage:        REM NREM TST  AHI 17.7 4.0 6.5  RDI 30.3 10.5 14.2           Body Position Data:  Sleep (min) TST (%) REM (min) NREM (min) CA (#) OA (#) MA (#) HYP (#) AHI (#/h) RERA (#) RDI (#/h) Desat (#)  Supine 27.2 8.68 0.0 27.2 0 0 0 2 4.4 5 15.4 1  Non-Supine 286.30 91.32 57.50 228.80 0.00 5.00 0.00 27.00 6.71 35.00 14.04 38.00  Left: 245.9 78.44 57.5 188.4 0 5 0 27 7.8 35 16.3 37  Right: 40.4 12.89 0.0 40.4  0 0 0 0 0.0 0 0.00 1    Snoring: Total number of snoring episodes  0  Total time with snoring    min (   % of sleep)   Oximetry Distribution:             WK REM NREM TOTAL  Average (%)   98 95 96 96  < 90% 1.0 1.5 0.1 2.6  < 80% 0.1 0.1 0.0 0.2  < 70% 0.0 0.0 0.0 0.0  # of Desaturations* 4 13 12 29   Desat Index (#/hour) 2.9 13.6 2.8 5.6  Desat Max (%) 14 17 10  17  Desat Max Dur (sec) 114.0 48.0 105.0 114.0  Approx Min O2 during sleep 77  Approx min O2 during a respiratory event 77  Was Oxygen added (Y/N) and final rate :    LPM  *Desaturations based on 4% or greater drop from baseline.   Cheyne Stokes Breathing: None Present   Heart Rate Summary:  Average Heart Rate During Sleep 54.9 bpm      Highest Heart Rate During Sleep (95th %) 255.0 bpm (artifact)  Highest Heart Rate During Sleep 255 bpm (artifact)  Highest Heart Rate During Recording (TIB) 255 bpm (artifact)   Heart Rate Observations: Event Type # Events   Bradycardia 0 Lowest HR Scored: N/A  Sinus Tachycardia During Sleep 0 Highest HR Scored: N/A  Narrow Complex Tachycardia 0 Highest HR Scored: N/A  Wide Complex Tachycardia 0 Highest HR Scored: N/A  Asystole 0 Longest Pause: N/A  Atrial Fibrillation 0 Duration Longest Event: N/A  Other Arrythmias   Type:    Periodic Limb Movement Data: (Primary legs unless otherwise noted) Total # Limb Movement 9 Limb Movement Index 1.7  Total # PLMS    PLMS Index     Total # PLMS Arousals    PLMS Arousal Index     Percentage Sleep Time with PLMS   min (   % sleep)  Mean Duration limb movements (secs)

## 2022-10-18 ENCOUNTER — Other Ambulatory Visit: Payer: Self-pay | Admitting: Nurse Practitioner

## 2022-10-19 NOTE — Telephone Encounter (Signed)
Requested Prescriptions  Pending Prescriptions Disp Refills   dapagliflozin propanediol (FARXIGA) 10 MG TABS tablet [Pharmacy Med Name: FARXIGA 10 MG TABLET] 90 tablet 1    Sig: TAKE 1 TABLET BY MOUTH EVERY DAY BEFORE BREAKFAST     Endocrinology:  Diabetes - SGLT2 Inhibitors Failed - 10/18/2022  9:55 PM      Failed - Cr in normal range and within 360 days    Creatinine  Date Value Ref Range Status  08/10/2022 1.35 (H) 0.44 - 1.00 mg/dL Final   Creatinine, Ser  Date Value Ref Range Status  08/26/2022 1.49 (H) 0.57 - 1.00 mg/dL Final         Failed - eGFR in normal range and within 360 days    GFR calc Af Amer  Date Value Ref Range Status  05/17/2020 55 (L) >59 mL/min/1.73 Final    Comment:    **In accordance with recommendations from the NKF-ASN Task force,**   [facility] is in the process of updating its eGFR calculation to the   2021 CKD-EPI creatinine equation that estimates kidney function   without a race variable.    GFR, Estimated  Date Value Ref Range Status  08/10/2022 41 (L) >60 mL/min Final    Comment:    (NOTE) Calculated using the CKD-EPI Creatinine Equation (2021)    eGFR  Date Value Ref Range Status  08/26/2022 37 (L) >59 mL/min/1.73 Final         Passed - HBA1C is between 0 and 7.9 and within 180 days    Hemoglobin A1C  Date Value Ref Range Status  09/13/2018 8.0  Final   HB A1C (BAYER DCA - WAIVED)  Date Value Ref Range Status  12/27/2020 7.9 (H) 4.8 - 5.6 % Final    Comment:             Prediabetes: 5.7 - 6.4          Diabetes: >6.4          Glycemic control for adults with diabetes: <7.0               **Please note reference interval change**    Hgb A1c MFr Bld  Date Value Ref Range Status  08/26/2022 7.5 (H) 4.8 - 5.6 % Final    Comment:             Prediabetes: 5.7 - 6.4          Diabetes: >6.4          Glycemic control for adults with diabetes: <7.0          Passed - Valid encounter within last 6 months    Recent Outpatient Visits            1 month ago Hypertension associated with diabetes [facility])   [place] [facility] Larae Grooms, NP   2 months ago Acute intractable headache, unspecified headache type   [place] [facility] Larae Grooms, NP   3 months ago Constipation, unspecified constipation type   [facility] Larae Grooms, NP   4 months ago Hypertension associated with diabetes [facility])   [place] [facility] Larae Grooms, NP   8 months ago Stage 3b chronic kidney disease [facility])   [place] [facility] Larae Grooms, NP       Future Appointments             In 1 month Laural Benes, Oralia Rud, DO [place] [facility], [facility]   In 1 month Gollan, Tollie Pizza, MD [facility]

## 2022-10-21 ENCOUNTER — Ambulatory Visit: Payer: Self-pay

## 2022-10-21 ENCOUNTER — Telehealth: Payer: Self-pay | Admitting: Nurse Practitioner

## 2022-10-21 DIAGNOSIS — K219 Gastro-esophageal reflux disease without esophagitis: Secondary | ICD-10-CM | POA: Diagnosis not present

## 2022-10-21 DIAGNOSIS — E785 Hyperlipidemia, unspecified: Secondary | ICD-10-CM | POA: Diagnosis not present

## 2022-10-21 DIAGNOSIS — I129 Hypertensive chronic kidney disease with stage 1 through stage 4 chronic kidney disease, or unspecified chronic kidney disease: Secondary | ICD-10-CM | POA: Diagnosis not present

## 2022-10-21 DIAGNOSIS — E1122 Type 2 diabetes mellitus with diabetic chronic kidney disease: Secondary | ICD-10-CM | POA: Diagnosis not present

## 2022-10-21 DIAGNOSIS — N189 Chronic kidney disease, unspecified: Secondary | ICD-10-CM | POA: Diagnosis not present

## 2022-10-21 DIAGNOSIS — R9431 Abnormal electrocardiogram [ECG] [EKG]: Secondary | ICD-10-CM | POA: Diagnosis not present

## 2022-10-21 DIAGNOSIS — M79603 Pain in arm, unspecified: Secondary | ICD-10-CM | POA: Diagnosis not present

## 2022-10-21 DIAGNOSIS — R079 Chest pain, unspecified: Secondary | ICD-10-CM | POA: Diagnosis not present

## 2022-10-21 DIAGNOSIS — M25522 Pain in left elbow: Secondary | ICD-10-CM | POA: Diagnosis not present

## 2022-10-21 DIAGNOSIS — R0789 Other chest pain: Secondary | ICD-10-CM | POA: Diagnosis not present

## 2022-10-21 DIAGNOSIS — Z7901 Long term (current) use of anticoagulants: Secondary | ICD-10-CM | POA: Diagnosis not present

## 2022-10-21 DIAGNOSIS — I4891 Unspecified atrial fibrillation: Secondary | ICD-10-CM | POA: Diagnosis not present

## 2022-10-21 DIAGNOSIS — M79602 Pain in left arm: Secondary | ICD-10-CM | POA: Diagnosis not present

## 2022-10-21 NOTE — Telephone Encounter (Signed)
    Chief Complaint: Numbness, tingling left arm. Constant today. Has weakness in arm. Having chest pain that comes and goes. Has a-fib. "I'm having speech problems and I feel really bad." Symptoms: Above Frequency: 2 days ago Pertinent Negatives: Patient denies chest pain now. Disposition: [x] ED /[] Urgent Care (no appt availability in office) / [] Appointment(In office/virtual)/ []  [place] [facility]/ [] Home Care/ [] Refused Recommended Disposition /[] [place] [facility]/ []  Follow-up with PCP Additional Notes: Pt. Will call 911.  Reason for Disposition  Sounds like a life-threatening emergency to the triager  Answer Assessment - Initial Assessment Questions 1. SYMPTOM: "What is the main symptom you are concerned about?" (e.g., weakness, numbness)     Left arm numbness and tingling 2. ONSET: "When did this start?" (minutes, hours, days; while sleeping)     2 days 3. LAST NORMAL: "When was the last time you (the patient) were normal (no symptoms)?"     2 days ago 4. PATTERN "Does this come and go, or has it been constant since it started?"  "Is it present now?"     Constant 5. CARDIAC SYMPTOMS: "Have you had any of the following symptoms: chest pain, difficulty breathing, palpitations?"    Pain at breast, left 6. NEUROLOGIC SYMPTOMS: "Have you had any of the following symptoms: headache, dizziness, vision loss, double vision, changes in speech, unsteady on your feet?"     Maybe speech problem 7. OTHER SYMPTOMS: "Do you have any other symptoms?"     "I feel bad." 8. PREGNANCY: "Is there any chance you are pregnant?" "When was your last menstrual period?"     No  Protocols used: Neurologic Deficit-A-AH

## 2022-10-22 ENCOUNTER — Encounter (INDEPENDENT_AMBULATORY_CARE_PROVIDER_SITE_OTHER): Payer: Medicare PPO | Admitting: Internal Medicine

## 2022-10-22 DIAGNOSIS — G4733 Obstructive sleep apnea (adult) (pediatric): Secondary | ICD-10-CM

## 2022-10-22 DIAGNOSIS — R079 Chest pain, unspecified: Secondary | ICD-10-CM | POA: Diagnosis not present

## 2022-10-22 DIAGNOSIS — I4891 Unspecified atrial fibrillation: Secondary | ICD-10-CM | POA: Diagnosis not present

## 2022-10-22 DIAGNOSIS — R9431 Abnormal electrocardiogram [ECG] [EKG]: Secondary | ICD-10-CM | POA: Diagnosis not present

## 2022-10-22 NOTE — Telephone Encounter (Signed)
Reviewed patient's chart. Patient went to ED for symptoms as instructed.

## 2022-10-28 NOTE — Procedures (Signed)
[facility]  Polysomnogram Report Part I  Phone: [phone] Fax: [phone]  Patient Name: Anna Juarez, Anna Juarez. Acquisition Number: [id number]  Date of Birth: 1950-12-06 Acquisition Date: 10/22/2022  Referring Physician: Clydie Braun L. Caren Griffins, NP     History: The patient is a 72 year old  . Medical History: arthritis, CKD, diabetes, fatty liver, GERD, hyperlipidemia, hypertension, palpitations, PONV, OSA, obesity, EDS.  Medications: acetaminophen, benazepril, dapaglifozin, Linzess, metopolol, ondansetron, pantoprazole, pregabalin, tramadol, trazodone.  Procedure: This routine overnight polysomnogram was performed on the Alice 5 using the standard CPAP protocol. This included 6 channels of EEG, 2 channels of EOG, chin EMG, bilateral anterior tibialis EMG, nasal/oral thermistor, PTAF (nasal pressure transducer), chest and abdominal wall movements, EKG, and pulse oximetry.  Description: The total recording time was 477.0 minutes. The total sleep time was 431.5 minutes. There were a total of 6.0 minutes of wakefulness after sleep onset for a slightly reducedsleep efficiency of 90.5%. The latency to sleep onset was prolonged at 39.5 minutes. The R sleep onset latency was within normal limits at 93.5 minutes. Sleep parameters, as a percentage of the total sleep time, demonstrated 0.8% of sleep was in N1 sleep, 81.7% N2, 3.4% N3 and 14.1% R sleep. There were a total of 51 arousals for an arousal index of 7.1 arousals per hour of sleep that was normal.  Overall, there were a total of 7 respiratory events for a respiratory disturbance index, which includes apneas, hypopneas and RERAs (increased respiratory effort) of 1.0 respiratory events per hour of sleep during the pressure titration.  was initiated at 5 cm H2O at lights out, 9:49 p.m. It was titrated in 1-2 cm increments for intermittent respiratory events to the final pressure of 8 cm H2O. The apnea was well controlled at the final pressure and  supine, REM sleep was observed.  Additionally, the baseline oxygen saturation during wakefulness was 94%, during NREM sleep averaged 94%, and during REM sleep averaged 93%. The total duration of oxygen < 90% was 2.9 minutes.  Cardiac monitoring-  significant cardiac rhythm irregularities.   Periodic limb movement monitoring- did not demonstrate periodic limb movements.     Impression: This patient's obstructive sleep apnea demonstrated significant improvement with the utilization of nasal  at 8 cm H2O.   Recommendations: Would recommend utilization of nasal  at 8 cm H2O.      An Airfit N2O small maskwas used. Chin strap used during study- no. Humidifier used during study- yes.     Yevonne Pax, MD, [facility] Diplomate ABMS-Pulmonary, Critical Care and Sleep Medicine  Electronically reviewed and digitally signed   [facility] CPAP/BIPAP Polysomnogram Report Part II Phone: [phone] Fax: [phone]  Patient last name Garry Neck Size 16.5 in. Acquisition [id number]  Patient first name Anna K. Weight 270.0 lbs. Started 10/22/2022 at 9:44:29 PM  Birth date [birth year] Height 62.0 in. Stopped 10/23/2022 at 5:51:17 AM  Age 43      Type Adult BMI 49.4 lb/in2 Duration 477.0  Robbi Garter RPSGT & Ja'Net Rennis Harding  Reviewed by: Valentino Hue. Henke, PhD, ABSM, FAASM Sleep Data: Lights Out: 9:49:59 PM Sleep Onset: 10:29:29 PM  Lights On: 5:46:59 AM Sleep Efficiency: 90.5 %  Total Recording Time: 477.0 min Sleep Latency (from Lights Off) 39.5 min  Total Sleep Time (TST): 431.5 min R Latency (from Sleep Onset): 93.5 min  Sleep Period Time: 436.5 min Total number of awakenings: 5  Wake during sleep: 5.0 min Wake After Sleep Onset (WASO): 6.0 min  Sleep Data:         Arousal Summary: Stage  Latency from lights out (min) Latency from sleep onset (min) Duration (min) % Total Sleep Time  Normal values  N 1 39.5 0.0 3.5 0.8 (5%)  N 2 43.0 3.5 352.5 81.7 (50%)  N 3 73.5 34.0 14.5 3.4  (20%)  R 133.0 93.5 61.0 14.1 (25%)   Number Index  Spontaneous 38 5.3  Apneas & Hypopneas 1 0.1  RERAs 0 0.0       (Apneas & Hypopneas & RERAs)  (1) (0.1)  Limb Movement 15 2.1  Snore 0 0.0  TOTAL 54 7.5     Respiratory Data:  CA OA MA Apnea Hypopnea* A+ H RERA Total  Number 2 0 0 2 5 7  0 7  Mean Dur (sec) 12.8 0.0 0.0 12.8 21.3 18.9 0.0 18.9  Max Dur (sec) 15.0 0.0 0.0 15.0 35.5 35.5 0.0 35.5  Total Dur (min) 0.4 0.0 0.0 0.4 1.8 2.2 0.0 2.2  % of TST 0.1 0.0 0.0 0.1 0.4 0.5 0.0 0.5  Index (#/h TST) 0.3 0.0 0.0 0.3 0.7 1.0 0.0 1.0  *Hypopneas scored based on 4% or greater desaturation.  Sleep Stage:         REM NREM TST  AHI 3.9 0.5 1.0  RDI 3.9 0.5 1.0   Sleep (min) TST (%) REM (min) NREM (min) CA (#) OA (#) MA (#) HYP (#) AHI (#/h) RERA (#) RDI (#/h) Desat (#)  Supine 235.0 54.46 28.0 207.0 0 0 0 1 0.3 0 0.3 12  Non-Supine 196.50 45.54 33.00 163.50 2.00 0.00 0.00 4.00 1.83 0 1.83 20.00  Right: 196.5 45.54 33.0 163.5 2 0 0 4 1.8 0 1.8 20     Snoring: Total number of snoring episodes  0  Total time with snoring    min (   % of sleep)   Oximetry Distribution:             WK REM NREM TOTAL  Average (%)   94 93 94 94  < 90% 0.4 2.0 0.5 2.9  < 80% 0.0 0.0 0.0 0.0  < 70% 0.0 0.0 0.0 0.0  # of Desaturations* 0 18 14 32  Desat Index (#/hour) 0.0 17.7 2.3 4.4  Desat Max (%) 0 13 11 13   Desat Max Dur (sec) 0.0 93.0 109.0 109.0  Approx Min O2 during sleep 80  Approx min O2 during a respiratory event 80  Was Oxygen added (Y/N) and final rate :    LPM  *Desaturations based on 3% or greater drop from baseline.   Cheyne Stokes Breathing: None Present    Heart Rate Summary:  Average Heart Rate During Sleep 72.5 bpm      Highest Heart Rate During Sleep (95th %) 80.0 bpm      Highest Heart Rate During Sleep 167 bpm (artifact)  Highest Heart Rate During Recording (TIB) 203 bpm (artifact)   Heart Rate Observations: Event Type # Events   Bradycardia 0 Lowest  HR Scored: N/A  Sinus Tachycardia During Sleep 0 Highest HR Scored: N/A  Narrow Complex Tachycardia 0 Highest HR Scored: N/A  Wide Complex Tachycardia 0 Highest HR Scored: N/A  Asystole 0 Longest Pause: N/A  Atrial Fibrillation 0 Duration Longest Event: N/A  Other Arrythmias   Type:   Periodic Limb Movement Data: (Primary legs unless otherwise noted) Total # Limb Movement 21 Limb Movement Index 2.9  Total # PLMS    PLMS Index  Total # PLMS Arousals    PLMS Arousal Index     Percentage Sleep Time with PLMS   min (   % sleep)  Mean Duration limb movements (secs)       IPAP Level (cmH2O) EPAP Level (cmH2O) Total Duration (min) Sleep Duration (min) Sleep (%) REM (%) CA  #) OA # MA # HYP #) AHI (#/hr) RERAs # RERAs (#/hr) RDI (#/hr)  5 5 14.1 14.1 100.0 0.0 0 0 0 1 4.3 0 0.0 4.3  6 6  110.0 108.5 98.6 28.1 2 0 0 3 2.8 0 0.0 2.8  8 8  311.8 308.3 98.9 9.6 0 0 0 1 0.2 0 0.0 0.2

## 2022-11-04 ENCOUNTER — Other Ambulatory Visit: Payer: Self-pay | Admitting: Nurse Practitioner

## 2022-11-04 NOTE — Telephone Encounter (Signed)
Requested Prescriptions  Pending Prescriptions Disp Refills   traZODone (DESYREL) 100 MG tablet [Pharmacy Med Name: TRAZODONE 100 MG TABLET] 90 tablet 0    Sig: TAKE 1 TABLET (100 MG TOTAL) BY MOUTH AT BEDTIME AS NEEDED. FOR SLEEP     Psychiatry: Antidepressants - Serotonin Modulator Passed - 11/04/2022  2:13 AM      Passed - Valid encounter within last 6 months    Recent Outpatient Visits           2 months ago Hypertension associated with diabetes [facility])   [place] [facility] Larae Grooms, NP   3 months ago Acute intractable headache, unspecified headache type   [place] [facility] Larae Grooms, NP   4 months ago Constipation, unspecified constipation type   [place] [facility] Larae Grooms, NP   5 months ago Hypertension associated with diabetes [facility])   [place] [facility] Larae Grooms, NP   8 months ago Stage 3b chronic kidney disease [facility])   [place] [facility] Larae Grooms, NP       Future Appointments             In 3 weeks Laural Benes, Oralia Rud, DO [place] [facility], [facility]   In 3 weeks Gollan, Tollie Pizza, MD [facility]

## 2022-11-22 ENCOUNTER — Other Ambulatory Visit: Payer: Self-pay | Admitting: Nurse Practitioner

## 2022-11-22 DIAGNOSIS — E119 Type 2 diabetes mellitus without complications: Secondary | ICD-10-CM

## 2022-11-24 NOTE — Telephone Encounter (Signed)
Requested Prescriptions  Pending Prescriptions Disp Refills   ACCU-CHEK GUIDE test strip [Pharmacy Med Name: ACCU-CHEK GUIDE TEST STRIP] 100 strip 0    Sig: USE AS INSTRUCTED     Endocrinology: Diabetes - Testing Supplies Passed - 11/22/2022  1:09 AM      Passed - Valid encounter within last 12 months    Recent Outpatient Visits           3 months ago Hypertension associated with diabetes [facility])   [place] [facility] Larae Grooms, NP   4 months ago Acute intractable headache, unspecified headache type   [place] [facility] Larae Grooms, NP   5 months ago Constipation, unspecified constipation type   [place] [facility] Larae Grooms, NP   6 months ago Hypertension associated with diabetes [facility])   [place] [facility] Larae Grooms, NP   9 months ago Stage 3b chronic kidney disease [facility])   [place] [facility] Larae Grooms, NP       Future Appointments             In 2 days Laural Benes, Oralia Rud, DO [place] [facility], [facility]   In 6 days Gollan, Tollie Pizza, MD [facility]

## 2022-11-26 ENCOUNTER — Encounter: Payer: Self-pay | Admitting: Family Medicine

## 2022-11-26 ENCOUNTER — Ambulatory Visit: Payer: Medicare PPO | Admitting: Family Medicine

## 2022-11-26 VITALS — BP 131/75 | HR 71 | Temp 97.7°F | Wt 290.2 lb

## 2022-11-26 DIAGNOSIS — G8929 Other chronic pain: Secondary | ICD-10-CM | POA: Diagnosis not present

## 2022-11-26 DIAGNOSIS — I152 Hypertension secondary to endocrine disorders: Secondary | ICD-10-CM | POA: Diagnosis not present

## 2022-11-26 DIAGNOSIS — E1169 Type 2 diabetes mellitus with other specified complication: Secondary | ICD-10-CM

## 2022-11-26 DIAGNOSIS — M545 Low back pain, unspecified: Secondary | ICD-10-CM

## 2022-11-26 DIAGNOSIS — E1142 Type 2 diabetes mellitus with diabetic polyneuropathy: Secondary | ICD-10-CM | POA: Diagnosis not present

## 2022-11-26 DIAGNOSIS — E1159 Type 2 diabetes mellitus with other circulatory complications: Secondary | ICD-10-CM

## 2022-11-26 DIAGNOSIS — N1832 Chronic kidney disease, stage 3b: Secondary | ICD-10-CM | POA: Diagnosis not present

## 2022-11-26 DIAGNOSIS — E785 Hyperlipidemia, unspecified: Secondary | ICD-10-CM

## 2022-11-26 DIAGNOSIS — Z7984 Long term (current) use of oral hypoglycemic drugs: Secondary | ICD-10-CM | POA: Diagnosis not present

## 2022-11-26 LAB — BAYER DCA HB A1C WAIVED: HB A1C (BAYER DCA - WAIVED): 7.2 % — ABNORMAL HIGH (ref 4.8–5.6)

## 2022-11-26 MED ORDER — ATORVASTATIN CALCIUM 40 MG PO TABS: 40.0000 mg | ORAL_TABLET | ORAL | 0 refills | Status: DC

## 2022-11-26 MED ORDER — TRAMADOL HCL 50 MG PO TABS
50.0000 mg | ORAL_TABLET | Freq: Three times a day (TID) | ORAL | 2 refills | Status: DC | PRN
Start: 2022-12-24 — End: 2023-03-01

## 2022-11-26 MED ORDER — LINACLOTIDE 145 MCG PO CAPS: 145.0000 ug | ORAL_CAPSULE | Freq: Every day | ORAL | 1 refills | Status: DC

## 2022-11-26 NOTE — Progress Notes (Signed)
BP 131/75   Pulse 71   Temp 97.7 F (36.5 C) (Oral)   Wt 290 lb 3.2 oz (131.6 kg)   SpO2 97%   BMI 53.08 kg/m    Subjective:    Patient ID: Anna Juarez, female    DOB: [birth year], 72 y.o.   MRN: [id number]  HPI: Anna Juarez is a 72 y.o. female  Chief Complaint  Patient presents with   Diabetes   Hypertension   Hyperlipidemia   DIABETES Hypoglycemic episodes:no Polydipsia/polyuria: no Visual disturbance: no Chest pain: no Paresthesias: no Glucose Monitoring: yes Taking Insulin?: no Blood Pressure Monitoring: not checking Retinal Examination: Up to Date Foot Exam: Up to Date Diabetic Education: Completed Pneumovax:  up to date Influenza: will get in September Aspirin: no  HYPERTENSION / HYPERLIPIDEMIA Satisfied with current treatment? yes Duration of hypertension: chronic BP monitoring frequency: not checking BP medication side effects: no Past BP meds: benazepril, metoprolol,  Duration of hyperlipidemia: chronic Cholesterol medication side effects: no Cholesterol supplements: none Medication compliance: excellent compliance Aspirin: no Recent stressors: no Recurrent headaches: no Visual changes: no Palpitations: no Dyspnea: no Chest pain: no Lower extremity edema: no Dizzy/lightheaded: no  CHRONIC PAIN  Present dose: 15 Morphine equivalents Pain control status: controlled Duration: chronic Location: back Quality: aching, sore Current Pain Level: mild Previous Pain Level: moderate Breakthrough pain: no Benefit from narcotic medications: yes What Activities task can be accomplished with current medication? Able to do her ADLs Interested in weaning off narcotics:yes   Stool softners/OTC fiber: yes  Previous pain specialty evaluation: no Non-narcotic analgesic meds: yes Narcotic contract: yes   Relevant past medical, surgical, family and social history reviewed and updated as indicated. Interim medical history since our last visit  reviewed. Allergies and medications reviewed and updated.  Review of Systems  Constitutional: Negative.   Respiratory: Negative.    Cardiovascular: Negative.   Gastrointestinal:  Positive for abdominal pain. Negative for abdominal distention, anal bleeding, blood in stool, constipation, diarrhea, nausea, rectal pain and vomiting.  Musculoskeletal: Negative.   Skin: Negative.   Neurological: Negative.   Psychiatric/Behavioral: Negative.      Per HPI unless specifically indicated above     Objective:    BP 131/75   Pulse 71   Temp 97.7 F (36.5 C) (Oral)   Wt 290 lb 3.2 oz (131.6 kg)   SpO2 97%   BMI 53.08 kg/m   Wt Readings from Last 3 Encounters:  11/26/22 290 lb 3.2 oz (131.6 kg)  10/05/22 290 lb (131.5 kg)  09/22/22 290 lb 2 oz (131.6 kg)    Physical Exam Vitals and nursing note reviewed.  Constitutional:      General: She is not in acute distress.    Appearance: Normal appearance. She is obese. She is not ill-appearing, toxic-appearing or diaphoretic.  HENT:     Head: Normocephalic and atraumatic.     Right Ear: External ear normal.     Left Ear: External ear normal.     Nose: Nose normal.     Mouth/Throat:     Mouth: Mucous membranes are moist.     Pharynx: Oropharynx is clear.  Eyes:     General: No scleral icterus.       Right eye: No discharge.        Left eye: No discharge.     Extraocular Movements: Extraocular movements intact.     Conjunctiva/sclera: Conjunctivae normal.     Pupils: Pupils are equal, round, and reactive to light.  Cardiovascular:     Rate and Rhythm: Normal rate and regular rhythm.     Pulses: Normal pulses.     Heart sounds: Normal heart sounds. No murmur heard.    No friction rub. No gallop.  Pulmonary:     Effort: Pulmonary effort is normal. No respiratory distress.     Breath sounds: Normal breath sounds. No stridor. No wheezing, rhonchi or rales.  Chest:     Chest wall: No tenderness.  Musculoskeletal:        General:  Normal range of motion.     Cervical back: Normal range of motion and neck supple.  Skin:    General: Skin is warm and dry.     Capillary Refill: Capillary refill takes less than 2 seconds.     Coloration: Skin is not jaundiced or pale.     Findings: No bruising, erythema, lesion or rash.  Neurological:     General: No focal deficit present.     Mental Status: She is alert and oriented to person, place, and time. Mental status is at baseline.  Psychiatric:        Mood and Affect: Mood normal.        Behavior: Behavior normal.        Thought Content: Thought content normal.        Judgment: Judgment normal.     Results for orders placed or performed in visit on 11/26/22  Bayer DCA Hb A1c Waived  Result Value Ref Range   HB A1C (BAYER DCA - WAIVED) 7.2 (H) 4.8 - 5.6 %      Assessment & Plan:   Problem List Items Addressed This Visit       Cardiovascular and Mediastinum   Hypertension associated with diabetes (HCC) - Primary    Under good control on current regimen. Continue current regimen. Continue to monitor. Call with any concerns. Refills up to date. Labs drawn today.        Relevant Medications   atorvastatin (LIPITOR) 40 MG tablet   Other Relevant Orders   CBC with Differential/Platelet   Comprehensive metabolic panel     Endocrine   DM type 2 with diabetic peripheral neuropathy (HCC)    Doing well with A1c of 7.3 down from 7.5. Continue current regimen. Continue to monitor. Call with any concerns.       Relevant Medications   atorvastatin (LIPITOR) 40 MG tablet   Other Relevant Orders   Bayer DCA Hb A1c Waived (Completed)   CBC with Differential/Platelet   Comprehensive metabolic panel   Hyperlipidemia associated with type 2 diabetes mellitus (HCC)    Will restart atorvastatin weekly. Recheck 3 months at follow up.      Relevant Medications   atorvastatin (LIPITOR) 40 MG tablet   Other Relevant Orders   CBC with Differential/Platelet   Comprehensive  metabolic panel   Lipid Panel w/o Chol/HDL Ratio     Genitourinary   CKD (chronic kidney disease) stage 3, GFR 30-59 ml/min (HCC)    Rechecking labs today. Await results. Treat as needed.       Relevant Orders   CBC with Differential/Platelet   Comprehensive metabolic panel     Other   Back pain    Under good control on current regimen. Continue current regimen. Continue to monitor. Call with any concerns. Refills given for 3 months. Follow up 3 months.        Relevant Medications   traMADol (ULTRAM) 50 MG tablet (Start on 12/24/2022)  Follow up plan: Return in about 3 months (around 02/26/2023) for with Clydie Braun.

## 2022-11-26 NOTE — Assessment & Plan Note (Deleted)
Chronic. Following with GI. Refill of pain medicine given today. Follow up 3 months.

## 2022-11-26 NOTE — Assessment & Plan Note (Signed)
Doing well with A1c of 7.3 down from 7.5. Continue current regimen. Continue to monitor. Call with any concerns.

## 2022-11-26 NOTE — Assessment & Plan Note (Signed)
Will restart atorvastatin weekly. Recheck 3 months at follow up.

## 2022-11-26 NOTE — Assessment & Plan Note (Signed)
Rechecking labs today. Await results. Treat as needed.  °

## 2022-11-26 NOTE — Assessment & Plan Note (Signed)
Under good control on current regimen. Continue current regimen. Continue to monitor. Call with any concerns. Refills given for 3 months. Follow up 3 months.    

## 2022-11-26 NOTE — Assessment & Plan Note (Signed)
Under good control on current regimen. Continue current regimen. Continue to monitor. Call with any concerns. Refills up to date. Labs drawn today.  

## 2022-11-27 LAB — COMPREHENSIVE METABOLIC PANEL
ALT: 16 IU/L (ref 0–32)
AST: 29 IU/L (ref 0–40)
Albumin: 4.3 g/dL (ref 3.8–4.8)
Alkaline Phosphatase: 69 IU/L (ref 44–121)
BUN/Creatinine Ratio: 10 — ABNORMAL LOW (ref 12–28)
BUN: 14 mg/dL (ref 8–27)
Bilirubin Total: 0.6 mg/dL (ref 0.0–1.2)
CO2: 23 mmol/L (ref 20–29)
Calcium: 9.1 mg/dL (ref 8.7–10.3)
Chloride: 102 mmol/L (ref 96–106)
Creatinine, Ser: 1.45 mg/dL — ABNORMAL HIGH (ref 0.57–1.00)
Globulin, Total: 2.7 g/dL (ref 1.5–4.5)
Glucose: 124 mg/dL — ABNORMAL HIGH (ref 70–99)
Potassium: 4.2 mmol/L (ref 3.5–5.2)
Sodium: 141 mmol/L (ref 134–144)
Total Protein: 7 g/dL (ref 6.0–8.5)
eGFR: 38 mL/min/{1.73_m2} — ABNORMAL LOW (ref >59)

## 2022-11-27 LAB — CBC WITH DIFFERENTIAL/PLATELET
Basophils Absolute: 0.1 10*3/uL (ref 0.0–0.2)
Basos: 1 %
EOS (ABSOLUTE): 0.1 10*3/uL (ref 0.0–0.4)
Eos: 1 %
Hematocrit: 45.2 % (ref 34.0–46.6)
Hemoglobin: 15.2 g/dL (ref 11.1–15.9)
Immature Grans (Abs): 0 10*3/uL (ref 0.0–0.1)
Immature Granulocytes: 0 %
Lymphocytes Absolute: 1.5 10*3/uL (ref 0.7–3.1)
Lymphs: 26 %
MCH: 31.1 pg (ref 26.6–33.0)
MCHC: 33.6 g/dL (ref 31.5–35.7)
MCV: 93 fL (ref 79–97)
Monocytes Absolute: 0.6 10*3/uL (ref 0.1–0.9)
Monocytes: 10 %
Neutrophils Absolute: 3.6 10*3/uL (ref 1.4–7.0)
Neutrophils: 62 %
Platelets: 175 10*3/uL (ref 150–450)
RBC: 4.88 x10E6/uL (ref 3.77–5.28)
RDW: 13.5 % (ref 11.7–15.4)
WBC: 5.8 10*3/uL (ref 3.4–10.8)

## 2022-11-27 LAB — LIPID PANEL W/O CHOL/HDL RATIO
Cholesterol, Total: 227 mg/dL — ABNORMAL HIGH (ref 100–199)
HDL: 51 mg/dL (ref >39)
LDL Chol Calc (NIH): 134 mg/dL — ABNORMAL HIGH (ref 0–99)
Triglycerides: 234 mg/dL — ABNORMAL HIGH (ref 0–149)
VLDL Cholesterol Cal: 42 mg/dL — ABNORMAL HIGH (ref 5–40)

## 2022-11-28 NOTE — H&P (View-Only) (Signed)
 Cardiology Office Note  Date:  11/30/2022   ID:  Anna Juarez, DOB Jan 27, 1951, MRN [id number]  PCP:  Melvin Pao, NP   Chief Complaint  Patient presents with   2 month follow up     "Doing well." Medications reviewed by the patient verbally.     HPI:  Anna Juarez is a 72 year old woman with past medical history of Hypertension hepatic cirrhosis MGUS diabetes type 2 abdominal pain Presenting for follow-up of her atrial fibrillation  Last seen by myself in clinic September 22, 2022  Echocardiogram results reviewed in detail with Normal left and right ventricular size and function Mild to moderate mitral valve regurgitation Mildly dilated left atrium  Reports having some shortness of breath and cough when laying down at night Weight unchanged from June 2024, last clinic visit Appreciates palpitations  Poor mobility, uses a wheelchair Able to walk short distances  hospital September 15, 2022 for endoscopy Atrial fibrillation documented on EKG September 15, 2022 Prior EKG November 2022 showing normal sinus rhythm  EKG personally reviewed by myself on todays visit EKG Interpretation Date/Time:  Monday November 30 2022 16:04:07 EDT Ventricular Rate:  85 PR Interval:    QRS Duration:  72 QT Interval:  362 QTC Calculation: 430 R Axis:   -15  Text Interpretation: Atrial fibrillation Low voltage QRS Cannot rule out Anterior infarct , age undetermined When compared with ECG of 15-Sep-2022 12:51, Nonspecific T wave abnormality no longer evident in Anterior leads Confirmed by Perla Lye [id number]) on 11/30/2022 4:24:12 PM    PMH:   has a past medical history of Arthritis, Chronic kidney disease, Complication of anesthesia, Diabetes mellitus without complication (HCC), Extreme obesity, Fatty liver, GERD (gastroesophageal reflux disease), Hyperlipidemia, Hypertension, Palpitations, PONV (postoperative nausea and vomiting), Radiculopathy, cervical, Renal insufficiency, Sleep apnea, and  Tear of acetabular labrum (01/2021).  PSH:    Past Surgical History:  Procedure Laterality Date   ABDOMINAL HYSTERECTOMY     CARDIAC CATHETERIZATION     [facility]   CATARACT EXTRACTION W/PHACO Right 06/02/2021   Procedure: CATARACT EXTRACTION PHACO AND INTRAOCULAR LENS PLACEMENT (IOC) RIGHT DIABETIC;  Surgeon: Myrna Adine Anes, MD;  Location: [facility];  Service: Ophthalmology;  Laterality: Right;  2.52 0:22.8   CATARACT EXTRACTION W/PHACO Left 06/16/2021   Procedure: CATARACT EXTRACTION PHACO AND INTRAOCULAR LENS PLACEMENT (IOC) LEFT DIABETIC 3.51 00:30.9;  Surgeon: Myrna Adine Anes, MD;  Location: [facility];  Service: Ophthalmology;  Laterality: Left;   CERVICAL FUSION     x2   CHOLECYSTECTOMY     COLONOSCOPY     COLONOSCOPY WITH PROPOFOL  N/A 09/09/2021   Procedure: COLONOSCOPY WITH PROPOFOL ;  Surgeon: Jinny Carmine, MD;  Location: [facility] ENDOSCOPY;  Service: Endoscopy;  Laterality: N/A;   ESOPHAGOGASTRODUODENOSCOPY (EGD) WITH PROPOFOL  N/A 09/09/2021   Procedure: ESOPHAGOGASTRODUODENOSCOPY (EGD) WITH PROPOFOL ;  Surgeon: Jinny Carmine, MD;  Location: [facility] ENDOSCOPY;  Service: Endoscopy;  Laterality: N/A;   ESOPHAGOGASTRODUODENOSCOPY (EGD) WITH PROPOFOL  N/A 09/15/2022   Procedure: ESOPHAGOGASTRODUODENOSCOPY (EGD) WITH PROPOFOL ;  Surgeon: Jinny Carmine, MD;  Location: [facility] ENDOSCOPY;  Service: Endoscopy;  Laterality: N/A;   JOINT REPLACEMENT Bilateral    knees    Current Outpatient Medications  Medication Sig Dispense Refill   ACCU-CHEK GUIDE test strip USE AS INSTRUCTED 100 strip 0   acetaminophen  (TYLENOL ) 500 MG tablet Take 2 tablets (1,000 mg total) by mouth every 6 (six) hours as needed for mild pain.     apixaban  (ELIQUIS ) 5 MG TABS tablet Take 1 tablet (5 mg  total) by mouth 2 (two) times daily. 180 tablet 3   atorvastatin  (LIPITOR) 40 MG tablet Take 1 tablet (40 mg total) by mouth once a week. 52 tablet 0   benazepril  (LOTENSIN ) 40 MG tablet TAKE 1 TABLET BY MOUTH  EVERY DAY 90 tablet 1   dapagliflozin  propanediol (FARXIGA ) 10 MG TABS tablet TAKE 1 TABLET BY MOUTH EVERY DAY BEFORE BREAKFAST 90 tablet 1   linaclotide  (LINZESS ) 145 MCG CAPS capsule Take 1 capsule (145 mcg total) by mouth daily before breakfast. 90 capsule 1   metoprolol  succinate (TOPROL -XL) 50 MG 24 hr tablet TAKE 1 TABLET BY MOUTH DAILY. TAKE WITH OR IMMEDIATELY FOLLOWING A MEAL. 90 tablet 1   ondansetron  (ZOFRAN ) 4 MG tablet TAKE 1 TABLET BY MOUTH EVERY 6 HOURS. 18 tablet 1   pantoprazole  (PROTONIX ) 40 MG tablet Take 1 tablet (40 mg total) by mouth 2 (two) times daily before a meal. 180 tablet 1   pregabalin  (LYRICA ) 150 MG capsule TAKE 1 CAPSULE BY MOUTH 3 TIMES DAILY. 270 capsule 1   SODIUM FLUORIDE 5000 PPM 1.1 % PSTE See admin instructions.     [START ON 12/24/2022] traMADol  (ULTRAM ) 50 MG tablet Take 1 tablet (50 mg total) by mouth every 8 (eight) hours as needed. 60 tablet 2   traZODone  (DESYREL ) 100 MG tablet TAKE 1 TABLET (100 MG TOTAL) BY MOUTH AT BEDTIME AS NEEDED. FOR SLEEP 90 tablet 0   No current facility-administered medications for this visit.    Allergies:   Actos [pioglitazone], Metformin and related, Scopolamine, and Victoza [liraglutide]   Social History:  The patient  reports that she has never smoked. She has never been exposed to tobacco smoke. She has never used smokeless tobacco. She reports that she does not drink alcohol and does not use drugs.   Family History:   family history includes Breast cancer in her maternal aunt and paternal aunt; Cancer in her brother; Diabetes in her mother; Heart disease in her father and mother; Hypertension in her brother and mother; Stroke in her daughter; Stroke (age of onset: 92) in her sister.    Review of Systems: Review of Systems  Constitutional: Negative.   HENT: Negative.    Respiratory:  Positive for shortness of breath.   Cardiovascular:  Positive for palpitations.  Gastrointestinal: Negative.   Musculoskeletal:  Negative.   Neurological: Negative.   Psychiatric/Behavioral: Negative.    All other systems reviewed and are negative.    PHYSICAL EXAM: VS:  BP 100/65 (BP Location: Right Wrist, Patient Position: Sitting, Cuff Size: Normal)   Pulse 85   Ht 5\' 3"  (1.6 m)   Wt 291 lb 8 oz (132.2 kg)   SpO2 96%   BMI 51.64 kg/m  , BMI Body mass index is 51.64 kg/m. Constitutional:  oriented to person, place, and time. No distress.  HENT:  Head: Grossly normal Eyes:  no discharge. No scleral icterus.  Neck: No JVD, no carotid bruits  Cardiovascular: Irregularly irregular no murmurs appreciated Pulmonary/Chest: Clear to auscultation bilaterally, no wheezes or rails Abdominal: Soft.  no distension.  no tenderness.  Musculoskeletal: Normal range of motion Neurological:  normal muscle tone. Coordination normal. No atrophy Skin: Skin warm and dry Psychiatric: normal affect, pleasant  Recent Labs: 11/26/2022: ALT 16; BUN 14; Creatinine, Ser 1.45; Hemoglobin 15.2; Platelets 175; Potassium 4.2; Sodium 141    Lipid Panel Lab Results  Component Value Date   CHOL 227 (H) 11/26/2022   HDL 51 11/26/2022   LDLCALC 134 (H)  11/26/2022   TRIG 234 (H) 11/26/2022      Wt Readings from Last 3 Encounters:  11/30/22 291 lb 8 oz (132.2 kg)  11/26/22 290 lb 3.2 oz (131.6 kg)  10/05/22 290 lb (131.5 kg)       ASSESSMENT AND PLAN:  Problem List Items Addressed This Visit       Cardiology Problems   Hyperlipidemia associated with type 2 diabetes mellitus (HCC)   Hypertension associated with diabetes (HCC)   Relevant Orders   EKG 12-Lead (Completed)   Atrial fibrillation (HCC) - Primary   Relevant Orders   EKG 12-Lead (Completed)     Other   DM type 2 with diabetic peripheral neuropathy (HCC)   CKD (chronic kidney disease) stage 3, GFR 30-59 ml/min (HCC)   Morbid obesity (HCC)   OSA (obstructive sleep apnea)    New onset atrial fibrillation Noted at the time of her EGD several months  ago Recommend she increase metoprolol  succinate up to 50 in the morning 20 5 in the evening Continue Eliquis  5 twice daily We will schedule a cardioversion  Left chest pain Atypical in nature, likely rib wall pain, musculoskeletal Symptoms improved  Essential hypertension Recommend she decrease benazepril  down to 20 mg daily Add additional metoprolol  succinate 25 mg for total of 75 mg daily Diabetes type 2 A1c 7.5, low carbohydrate diet recommended    Total encounter time more than 30 minutes  Greater than 50% was spent in counseling and coordination of care with the patient    Signed, Velinda Lunger, M.D., Ph.D. [facility] [place], [place] [phone]

## 2022-11-28 NOTE — Progress Notes (Unsigned)
Cardiology Office Note  Date:  11/30/2022   ID:  Anna, Juarez 06-13-1950, MRN [id number]  PCP:  Larae Grooms, NP   Chief Complaint  Patient presents with   2 month follow up     "Doing well." Medications reviewed by the patient verbally.     HPI:  [name] is a 72 year old woman with past medical history of Hypertension hepatic cirrhosis MGUS diabetes type 2 abdominal pain Presenting for follow-up of her atrial fibrillation  Last seen by myself in clinic September 22, 2022  Echocardiogram results reviewed in detail with Normal left and right ventricular size and function Mild to moderate mitral valve regurgitation Mildly dilated left atrium  Reports having some shortness of breath and cough when laying down at night Weight unchanged from June 2024, last clinic visit Appreciates palpitations  Poor mobility, uses a wheelchair Able to walk short distances  hospital September 15, 2022 for endoscopy Atrial fibrillation documented on EKG September 15, 2022 Prior EKG November 2022 showing normal sinus rhythm  EKG personally reviewed by myself on todays visit EKG Interpretation Date/Time:  Monday November 30 2022 16:04:07 EDT Ventricular Rate:  85 PR Interval:    QRS Duration:  72 QT Interval:  362 QTC Calculation: 430 R Axis:   -15  Text Interpretation: Atrial fibrillation Low voltage QRS Cannot rule out Anterior infarct , age undetermined When compared with ECG of 15-Sep-2022 12:51, Nonspecific T wave abnormality no longer evident in Anterior leads Confirmed by Julien Nordmann [phone]) on 11/30/2022 4:24:12 PM    PMH:   has a past medical history of Arthritis, Chronic kidney disease, Complication of anesthesia, Diabetes mellitus without complication (HCC), Extreme obesity, Fatty liver, GERD (gastroesophageal reflux disease), Hyperlipidemia, Hypertension, Palpitations, PONV (postoperative nausea and vomiting), Radiculopathy, cervical, Renal insufficiency, Sleep apnea, and  Tear of acetabular labrum (01/2021).  PSH:    Past Surgical History:  Procedure Laterality Date   ABDOMINAL HYSTERECTOMY     CARDIAC CATHETERIZATION     [facility]   CATARACT EXTRACTION W/PHACO Right 06/02/2021   Procedure: CATARACT EXTRACTION PHACO AND INTRAOCULAR LENS PLACEMENT (IOC) RIGHT DIABETIC;  Surgeon: Nevada Crane, MD;  Location: [facility];  Service: Ophthalmology;  Laterality: Right;  2.52 0:22.8   CATARACT EXTRACTION W/PHACO Left 06/16/2021   Procedure: CATARACT EXTRACTION PHACO AND INTRAOCULAR LENS PLACEMENT (IOC) LEFT DIABETIC 3.51 00:30.9;  Surgeon: Nevada Crane, MD;  Location: [facility];  Service: Ophthalmology;  Laterality: Left;   CERVICAL FUSION     x2   CHOLECYSTECTOMY     COLONOSCOPY     COLONOSCOPY WITH PROPOFOL N/A 09/09/2021   Procedure: COLONOSCOPY WITH PROPOFOL;  Surgeon: [name], MD;  Location: [facility];  Service: Endoscopy;  Laterality: N/A;   ESOPHAGOGASTRODUODENOSCOPY (EGD) WITH PROPOFOL N/A 09/09/2021   Procedure: ESOPHAGOGASTRODUODENOSCOPY (EGD) WITH PROPOFOL;  Surgeon: [name], MD;  Location: [facility] END[facility]COPY;  Service: Endoscopy;  Laterality: N/A;   ESOPHAGOGASTRODUODENOSCOPY (EGD) WITH PROPOFOL N/A 09/15/2022   Procedure: ESOPHAGOGASTRODUODENOSCOPY (EGD) WITH PROPOFOL;  Surgeon: [name], MD;  Location: [facility] END[facility];  Service: Endoscopy;  Laterality: N/A;   JOINT REPLACEMENT Bilateral    knees    Current Outpatient Medications  Medication Sig Dispense Refill   ACCU-CHEK GUIDE test strip USE AS INSTRUCTED 100 strip 0   acetaminophen (TYLENOL) 500 MG tablet Take 2 tablets (1,000 mg total) by mouth every 6 (six) hours as needed for mild pain.     apixaban (ELIQUIS) 5 MG TABS tablet Take 1 tablet (5 mg  total) by mouth 2 (two) times daily. 180 tablet 3   atorvastatin (LIPITOR) 40 MG tablet Take 1 tablet (40 mg total) by mouth once a week. 52 tablet 0   benazepril (LOTENSIN) 40 MG tablet TAKE 1 TABLET BY MOUTH  EVERY DAY 90 tablet 1   dapagliflozin propanediol (FARXIGA) 10 MG TABS tablet TAKE 1 TABLET BY MOUTH EVERY DAY BEFORE BREAKFAST 90 tablet 1   linaclotide (LINZESS) 145 MCG CAPS capsule Take 1 capsule (145 mcg total) by mouth daily before breakfast. 90 capsule 1   metoprolol succinate (TOPROL-XL) 50 MG 24 hr tablet TAKE 1 TABLET BY MOUTH DAILY. TAKE WITH OR IMMEDIATELY FOLLOWING A MEAL. 90 tablet 1   ondansetron (ZOFRAN) 4 MG tablet TAKE 1 TABLET BY MOUTH EVERY 6 HOURS. 18 tablet 1   pantoprazole (PROTONIX) 40 MG tablet Take 1 tablet (40 mg total) by mouth 2 (two) times daily before a meal. 180 tablet 1   pregabalin (LYRICA) 150 MG capsule TAKE 1 CAPSULE BY MOUTH 3 TIMES DAILY. 270 capsule 1   SODIUM FLUORIDE 5000 PPM 1.1 % PSTE See admin instructions.     [START ON 12/24/2022] traMADol (ULTRAM) 50 MG tablet Take 1 tablet (50 mg total) by mouth every 8 (eight) hours as needed. 60 tablet 2   traZODone (DESYREL) 100 MG tablet TAKE 1 TABLET (100 MG TOTAL) BY MOUTH AT BEDTIME AS NEEDED. FOR SLEEP 90 tablet 0   No current facility-administered medications for this visit.    Allergies:   Actos [pioglitazone], Metformin and related, Scopolamine, and Victoza [liraglutide]   Social History:  The patient  reports that she has never smoked. She has never been exposed to tobacco smoke. She has never used smokeless tobacco. She reports that she does not drink alcohol and does not use drugs.   Family History:   family history includes Breast cancer in her maternal aunt and paternal aunt; Cancer in her brother; Diabetes in her mother; Heart disease in her father and mother; Hypertension in her brother and mother; Stroke in her daughter; Stroke (age of onset: 30) in her sister.    Review of Systems: Review of Systems  Constitutional: Negative.   HENT: Negative.    Respiratory:  Positive for shortness of breath.   Cardiovascular:  Positive for palpitations.  Gastrointestinal: Negative.   Musculoskeletal:  Negative.   Neurological: Negative.   Psychiatric/Behavioral: Negative.    All other systems reviewed and are negative.    PHYSICAL EXAM: VS:  BP 100/65 (BP Location: Right Wrist, Patient Position: Sitting, Cuff Size: Normal)   Pulse 85   Ht 5\' 3"  (1.6 m)   Wt 291 lb 8 oz (132.2 kg)   SpO2 96%   BMI 51.64 kg/m  , BMI Body mass index is 51.64 kg/m. Constitutional:  oriented to person, place, and time. No distress.  HENT:  Head: Grossly normal Eyes:  no discharge. No scleral icterus.  Neck: No JVD, no carotid bruits  Cardiovascular: Irregularly irregular no murmurs appreciated Pulmonary/Chest: Clear to auscultation bilaterally, no wheezes or rails Abdominal: Soft.  no distension.  no tenderness.  Musculoskeletal: Normal range of motion Neurological:  normal muscle tone. Coordination normal. No atrophy Skin: Skin warm and dry Psychiatric: normal affect, pleasant  Recent Labs: 11/26/2022: ALT 16; BUN 14; Creatinine, Ser 1.45; Hemoglobin 15.2; Platelets 175; Potassium 4.2; Sodium 141    Lipid Panel Lab Results  Component Value Date   CHOL 227 (H) 11/26/2022   HDL 51 11/26/2022   LDLCALC 134 (H)  11/26/2022   TRIG 234 (H) 11/26/2022      Wt Readings from Last 3 Encounters:  11/30/22 291 lb 8 oz (132.2 kg)  11/26/22 290 lb 3.2 oz (131.6 kg)  10/05/22 290 lb (131.5 kg)       ASSESSMENT AND PLAN:  Problem List Items Addressed This Visit       Cardiology Problems   Hyperlipidemia associated with type 2 diabetes mellitus (HCC)   Hypertension associated with diabetes (HCC)   Relevant Orders   EKG 12-Lead (Completed)   Atrial fibrillation (HCC) - Primary   Relevant Orders   EKG 12-Lead (Completed)     Other   DM type 2 with diabetic peripheral neuropathy (HCC)   CKD (chronic kidney disease) stage 3, GFR 30-59 ml/min (HCC)   Morbid obesity (HCC)   OSA (obstructive sleep apnea)    New onset atrial fibrillation Noted at the time of her EGD several months  ago Recommend she increase metoprolol succinate up to 50 in the morning 20 5 in the evening Continue Eliquis 5 twice daily We will schedule a cardioversion  Left chest pain Atypical in nature, likely rib wall pain, musculoskeletal Symptoms improved  Essential hypertension Recommend she decrease benazepril down to 20 mg daily Add additional metoprolol succinate 25 mg for total of 75 mg daily Diabetes type 2 A1c 7.5, low carbohydrate diet recommended    Total encounter time more than 30 minutes  Greater than 50% was spent in counseling and coordination of care with the patient    Signed, Dossie Arbour, M.D., Ph.D. [facility] [place], [place] [phone]

## 2022-11-30 ENCOUNTER — Ambulatory Visit: Payer: Medicare PPO | Attending: Cardiovascular Disease | Admitting: Cardiovascular Disease

## 2022-11-30 ENCOUNTER — Encounter: Payer: Self-pay | Admitting: Cardiovascular Disease

## 2022-11-30 VITALS — BP 100/65 | HR 85 | Ht 63.0 in | Wt 291.5 lb

## 2022-11-30 DIAGNOSIS — N1832 Chronic kidney disease, stage 3b: Secondary | ICD-10-CM

## 2022-11-30 DIAGNOSIS — Z79899 Other long term (current) drug therapy: Secondary | ICD-10-CM

## 2022-11-30 DIAGNOSIS — G4733 Obstructive sleep apnea (adult) (pediatric): Secondary | ICD-10-CM | POA: Diagnosis not present

## 2022-11-30 DIAGNOSIS — E1142 Type 2 diabetes mellitus with diabetic polyneuropathy: Secondary | ICD-10-CM

## 2022-11-30 DIAGNOSIS — I152 Hypertension secondary to endocrine disorders: Secondary | ICD-10-CM

## 2022-11-30 DIAGNOSIS — E1169 Type 2 diabetes mellitus with other specified complication: Secondary | ICD-10-CM | POA: Diagnosis not present

## 2022-11-30 DIAGNOSIS — I10 Essential (primary) hypertension: Secondary | ICD-10-CM | POA: Diagnosis not present

## 2022-11-30 DIAGNOSIS — E1159 Type 2 diabetes mellitus with other circulatory complications: Secondary | ICD-10-CM | POA: Diagnosis not present

## 2022-11-30 DIAGNOSIS — I4819 Other persistent atrial fibrillation: Secondary | ICD-10-CM | POA: Diagnosis not present

## 2022-11-30 DIAGNOSIS — E785 Hyperlipidemia, unspecified: Secondary | ICD-10-CM

## 2022-11-30 MED ORDER — BENAZEPRIL HCL 20 MG PO TABS
20.0000 mg | ORAL_TABLET | Freq: Every day | ORAL | 3 refills | Status: DC
Start: 2022-11-30 — End: 2023-12-16

## 2022-11-30 MED ORDER — METOPROLOL SUCCINATE ER 50 MG PO TB24
50.0000 mg | ORAL_TABLET | Freq: Every day | ORAL | 3 refills | Status: DC
Start: 2022-11-30 — End: 2024-01-10

## 2022-11-30 NOTE — Patient Instructions (Signed)
Medication Instructions:  Please decrease the benzepril down to 20 mg daily (1/2 pill)  Please add extra metoprolol 1/2 pill in the PM Continue metoprolol 50 in the AM  If you need a refill on your cardiac medications before your next appointment, please call your pharmacy.   Lab work: Your provider would like for you to return this week to have the following labs drawn: BMP and  CBC.   Please go to [facility] [address] ([facility] [address], [place] [address] You do not need an appointment.  They are open from 7:30 am-4 pm.  Lunch from 1:00 pm- 2:00 pm You will not need to be fasting.   You may also go to any of these [facility] locations:  [employer]  - [address] [employer] [address] ([facility])      Testing/Procedures: Cardioversion for atrial fibrillation You are scheduled for a Cardioversion on December 09, 2022 with Dr. Mariah Milling.    Please arrive at the [facility] Entrance of [facility], [address] [place], [place] [address] at 06:30 am (This is 1 hour prior to your procedure time).  Proceed to the Check-In Desk directly inside the entrance.  Procedure Parking: Use the entrance off of the [address] side of the hospital. Turn right upon entering and follow the driveway to parking that is directly in front of the [facility] Vascular Center. There is no valet parking available at this entrance, however there is an awning directly in front of the [facility] Vascular Center for drop off/ pick up for patients  DIET: Nothing to eat or drink after midnight except a sip of water with medications (see medication instructions below)  Medication Instructions: Hold Farxiga for 3 days prior to procedure.  Continue your anticoagulant: Eliquis If you miss a dose, please call us at [phone] You will need to continue your anticoagulant after your procedure until you are told by your provider that it is safe to stop.    FYI: For your safety,  and to allow [place] to monitor your vital signs accurately during the surgery/procedure we request that if you have artificial nails, gel coating, SNS etc. Please have those removed prior to your surgery/procedure. Not having the nail coverings /polish removed may result in cancellation or delay of your surgery/procedure.  You must have a responsible person to drive you home and stay in the waiting area during your procedure. Failure to do so could result in cancellation.  Bring your insurance cards.  If you have any questions after you get home, please call the office at [phone]  *Special Note: Every effort is made to have your procedure done on time. Occasionally there are emergencies that occur at the hospital that may cause delays. Please be patient if a delay does occur.       Follow-Up: At [facility], you and your health needs are our priority.  As part of our continuing mission to provide you with exceptional heart care, we have created designated Provider Care Teams.  These Care Teams include your primary Cardiologist (physician) and Advanced Practice Providers (APPs -  Physician Assistants and Nurse Practitioners) who all work together to provide you with the care you need, when you need it.  You will need a follow up appointment in 1 month  Providers on your designated Care Team:   Nicolasa Ducking, NP Eula Listen, PA-C Cadence Fransico Michael, [place]  COVID-19 Vaccine Information can be found at: [email or web address] For  questions related to vaccine distribution or appointments, please email [email or web address] or call [phone].

## 2022-12-01 ENCOUNTER — Other Ambulatory Visit: Payer: Self-pay | Admitting: Cardiovascular Disease

## 2022-12-01 DIAGNOSIS — I4819 Other persistent atrial fibrillation: Secondary | ICD-10-CM

## 2022-12-01 DIAGNOSIS — N1832 Chronic kidney disease, stage 3b: Secondary | ICD-10-CM | POA: Diagnosis not present

## 2022-12-01 DIAGNOSIS — E1122 Type 2 diabetes mellitus with diabetic chronic kidney disease: Secondary | ICD-10-CM | POA: Diagnosis not present

## 2022-12-08 ENCOUNTER — Telehealth: Payer: Self-pay | Admitting: *Deleted

## 2022-12-08 DIAGNOSIS — R600 Localized edema: Secondary | ICD-10-CM | POA: Diagnosis not present

## 2022-12-08 DIAGNOSIS — I10 Essential (primary) hypertension: Secondary | ICD-10-CM | POA: Diagnosis not present

## 2022-12-08 DIAGNOSIS — N1832 Chronic kidney disease, stage 3b: Secondary | ICD-10-CM | POA: Diagnosis not present

## 2022-12-08 DIAGNOSIS — E1122 Type 2 diabetes mellitus with diabetic chronic kidney disease: Secondary | ICD-10-CM | POA: Diagnosis not present

## 2022-12-08 NOTE — Telephone Encounter (Signed)
Spoke with patient and reviewed her procedure scheduled for tomorrow. Discussed instructions and she had no further questions at this time.

## 2022-12-09 ENCOUNTER — Encounter: Payer: Self-pay | Admitting: Cardiovascular Disease

## 2022-12-09 ENCOUNTER — Ambulatory Visit: Payer: Medicare PPO | Admitting: Anesthesiology

## 2022-12-09 ENCOUNTER — Other Ambulatory Visit: Payer: Self-pay

## 2022-12-09 ENCOUNTER — Encounter
Admission: RE | Disposition: A | Discharge status: Home / Self Care | Payer: Self-pay | Source: Home / Self Care | Attending: Cardiovascular Disease

## 2022-12-09 ENCOUNTER — Ambulatory Visit
Admission: RE | Admit: 2022-12-09 | Discharge: 2022-12-09 | Disposition: A | Discharge status: Home / Self Care | Payer: Medicare PPO | Attending: Cardiovascular Disease | Admitting: Cardiovascular Disease

## 2022-12-09 DIAGNOSIS — I340 Nonrheumatic mitral (valve) insufficiency: Secondary | ICD-10-CM | POA: Insufficient documentation

## 2022-12-09 DIAGNOSIS — K219 Gastro-esophageal reflux disease without esophagitis: Secondary | ICD-10-CM | POA: Insufficient documentation

## 2022-12-09 DIAGNOSIS — N189 Chronic kidney disease, unspecified: Secondary | ICD-10-CM | POA: Diagnosis not present

## 2022-12-09 DIAGNOSIS — R0602 Shortness of breath: Secondary | ICD-10-CM

## 2022-12-09 DIAGNOSIS — G4730 Sleep apnea, unspecified: Secondary | ICD-10-CM | POA: Diagnosis not present

## 2022-12-09 DIAGNOSIS — D472 Monoclonal gammopathy: Secondary | ICD-10-CM | POA: Diagnosis not present

## 2022-12-09 DIAGNOSIS — K7460 Unspecified cirrhosis of liver: Secondary | ICD-10-CM | POA: Insufficient documentation

## 2022-12-09 DIAGNOSIS — I4891 Unspecified atrial fibrillation: Secondary | ICD-10-CM | POA: Diagnosis not present

## 2022-12-09 DIAGNOSIS — E1122 Type 2 diabetes mellitus with diabetic chronic kidney disease: Secondary | ICD-10-CM | POA: Diagnosis not present

## 2022-12-09 DIAGNOSIS — N183 Chronic kidney disease, stage 3 unspecified: Secondary | ICD-10-CM | POA: Diagnosis not present

## 2022-12-09 DIAGNOSIS — I129 Hypertensive chronic kidney disease with stage 1 through stage 4 chronic kidney disease, or unspecified chronic kidney disease: Secondary | ICD-10-CM | POA: Insufficient documentation

## 2022-12-09 DIAGNOSIS — Z6841 Body Mass Index (BMI) 40.0 and over, adult: Secondary | ICD-10-CM | POA: Diagnosis not present

## 2022-12-09 DIAGNOSIS — I4819 Other persistent atrial fibrillation: Secondary | ICD-10-CM | POA: Insufficient documentation

## 2022-12-09 HISTORY — PX: CARDIOVERSION: SHX1299

## 2022-12-09 LAB — GLUCOSE, CAPILLARY: Glucose-Capillary: 153 mg/dL — ABNORMAL HIGH (ref 70–99)

## 2022-12-09 SURGERY — CARDIOVERSION
Anesthesia: General

## 2022-12-09 MED ORDER — SODIUM CHLORIDE 0.9 % IV SOLN: INTRAVENOUS | Status: DC

## 2022-12-09 MED ORDER — PROPOFOL 10 MG/ML IV BOLUS
INTRAVENOUS | Status: DC | PRN
  Administered 2022-12-09: 50 mg via INTRAVENOUS

## 2022-12-09 NOTE — Transfer of Care (Signed)
Immediate Anesthesia Transfer of Care Note  Patient: Anna Juarez  Procedure(s) Performed: CARDIOVERSION  Patient Location:  spu  Anesthesia Type:General  Level of Consciousness: drowsy  Airway & Oxygen Therapy: Patient Spontanous Breathing and Patient connected to nasal cannula oxygen  Post-op Assessment: Report given to RN and Post -op Vital signs reviewed and stable  Post vital signs: Reviewed  Last Vitals:  Vitals Value Taken Time  BP 127/81 12/09/22 0800  Temp    Pulse 60 12/09/22 0801  Resp 20 12/09/22 0801  SpO2 100 % 12/09/22 0801  Vitals shown include unfiled device data.  Last Pain:  Vitals:   12/09/22 0655  TempSrc: Oral  PainSc: 0-No pain         Complications: No notable events documented.

## 2022-12-09 NOTE — Anesthesia Postprocedure Evaluation (Signed)
Anesthesia Post Note  Patient: Anna Juarez  Procedure(s) Performed: CARDIOVERSION  Patient location during evaluation: PACU Anesthesia Type: General Level of consciousness: awake and alert Pain management: pain level controlled Vital Signs Assessment: post-procedure vital signs reviewed and stable Respiratory status: spontaneous breathing, nonlabored ventilation and respiratory function stable Cardiovascular status: blood pressure returned to baseline and stable Postop Assessment: no apparent nausea or vomiting Anesthetic complications: no   No notable events documented.   Last Vitals:  Vitals:   12/09/22 0815 12/09/22 0830  BP: 121/70 119/71  Pulse: 60 60  Resp: 13 16  Temp:    SpO2: 97% 97%    Last Pain:  Vitals:   12/09/22 0655  TempSrc: Oral  PainSc: 0-No pain                 Foye Deer

## 2022-12-09 NOTE — CV Procedure (Signed)
Cardioversion procedure note °For atrial fibrillation, persistent ° °Procedure Details: ° °Consent: Risks of procedure as well as the alternatives and risks of each were explained to the (patient/caregiver).  Consent for procedure obtained. ° °Time Out: Verified patient identification, verified procedure, site/side was marked, verified correct patient position, special equipment/implants available, medications/allergies/relevent history reviewed, required imaging and test results available.  Performed ° °Patient placed on cardiac monitor, pulse oximetry, supplemental oxygen as necessary.   °Sedation given: propofol IV, Dr. Johnson °Pacer pads placed anterior and posterior chest. ° ° °Cardioverted 1 time(s).   °Cardioverted at  150 J. Synchronized biphasic °Converted to NSR ° ° °Evaluation: °Findings: Post procedure EKG shows: NSR °Complications: None °Patient did tolerate procedure well. ° °Time Spent Directly with the Patient: ° °45 minutes  ° °Tim Gollan, M.D., Ph.D.  °

## 2022-12-09 NOTE — Anesthesia Preprocedure Evaluation (Addendum)
Anesthesia Evaluation  Patient identified by MRN, date of birth, ID band Patient awake    History of Anesthesia Complications (+) PONV and history of anesthetic complications  Airway Mallampati: IV  TM Distance: >3 FB Neck ROM: Full    Dental no notable dental hx.    Pulmonary sleep apnea and Continuous Positive Airway Pressure Ventilation    Pulmonary exam normal        Cardiovascular Exercise Tolerance: Poor hypertension, + Orthopnea  + dysrhythmias Atrial Fibrillation + Valvular Problems/Murmurs MR  Rhythm:Irregular Rate:Normal  ECHO 7/24:  1. Left ventricular ejection fraction, by estimation, is 60 to 65%. The  left ventricle has normal function. The left ventricle has no regional  wall motion abnormalities. Left ventricular diastolic parameters are  indeterminate. The average left  ventricular global longitudinal strain is -14.4 %.   2. Right ventricular systolic function is normal. The right ventricular  size is normal. There is mildly elevated pulmonary artery systolic  pressure. The estimated right ventricular systolic pressure is 38.2 mmHg.   3. Left atrial size was mildly dilated.   4. The mitral valve is normal in structure. Mild to moderate mitral valve  regurgitation. No evidence of mitral stenosis.   5. The aortic valve is normal in structure. Aortic valve regurgitation is  mild. No aortic stenosis is present.   6. The inferior vena cava is normal in size with greater than 50%  respiratory variability, suggesting right atrial pressure of 3 mmHg.     Neuro/Psych negative neurological ROS     GI/Hepatic ,GERD  ,,Hepatic cirrhosis   Endo/Other  diabetes, Well Controlled, Type 2  Morbid obesity  Renal/GU Renal InsufficiencyRenal disease     Musculoskeletal   Abdominal  (+) + obese  Peds  Hematology   Anesthesia Other Findings   Reproductive/Obstetrics                              Anesthesia Physical Anesthesia Plan  ASA: 3  Anesthesia Plan: General   Post-op Pain Management:    Induction: Intravenous  PONV Risk Score and Plan: 3 and TIVA and Propofol infusion  Airway Management Planned: Nasal Cannula and Natural Airway  Additional Equipment: None  Intra-op Plan:   Post-operative Plan:   Informed Consent: I have reviewed the patients History and Physical, chart, labs and discussed the procedure including the risks, benefits and alternatives for the proposed anesthesia with the patient or authorized representative who has indicated his/her understanding and acceptance.       Plan Discussed with: CRNA  Anesthesia Plan Comments:         Anesthesia Quick Evaluation

## 2022-12-13 NOTE — H&P (Signed)
H&P Addendum, pre-cardioversion ° °Patient was seen and evaluated prior to -cardioversion procedure °Symptoms, prior testing details again confirmed with the patient °Patient examined, no significant change from prior exam °Lab work reviewed in detail personally by myself °Patient understands risk and benefit of the procedure,  °The risks (stroke, cardiac arrhythmias rarely resulting in the need for a temporary or permanent pacemaker, skin irritation or burns and complications associated with conscious sedation including aspiration, arrhythmia, respiratory failure and death), benefits (restoration of normal sinus rhythm) and alternatives of a direct current cardioversion were explained in detail °Patient willing to proceed. ° °Signed, °Tim Gollan, MD, Ph.D °[facility]  °

## 2022-12-13 NOTE — Interval H&P Note (Signed)
History and Physical Interval Note:  12/13/2022 4:25 PM  Anna Juarez  has presented today for surgery, with the diagnosis of Cardioversion  Afib.  The various methods of treatment have been discussed with the patient and family. After consideration of risks, benefits and other options for treatment, the patient has consented to  Procedure(s): CARDIOVERSION (N/A) as a surgical intervention.  The patient's history has been reviewed, patient examined, no change in status, stable for surgery.  I have reviewed the patient's chart and labs.  Questions were answered to the patient's satisfaction.     Julien Nordmann

## 2022-12-23 DIAGNOSIS — G4733 Obstructive sleep apnea (adult) (pediatric): Secondary | ICD-10-CM | POA: Diagnosis not present

## 2022-12-28 ENCOUNTER — Other Ambulatory Visit: Payer: Self-pay | Admitting: Nurse Practitioner

## 2022-12-29 NOTE — Telephone Encounter (Signed)
D/C 01/19/22. Requested Prescriptions  Refused Prescriptions Disp Refills   sucralfate (CARAFATE) 1 g tablet [Pharmacy Med Name: SUCRALFATE 1 GM TABLET] 90 tablet 1    Sig: TAKE 1 TABLET (1 G TOTAL) BY MOUTH 4 TIMES A DAY WITH MEALS AND AT BEDTIME     Gastroenterology: Antiacids Passed - 12/28/2022  5:18 PM      Passed - Valid encounter within last 12 months    Recent Outpatient Visits           1 month ago Hypertension associated with diabetes [facility])   [facility] [facility], Megan P, DO   4 months ago Hypertension associated with diabetes [facility])   [place] [facility] Larae Grooms, NP   5 months ago Acute intractable headache, unspecified headache type   [place] [facility] Larae Grooms, NP   6 months ago Constipation, unspecified constipation type   [place] [facility] Larae Grooms, NP   7 months ago Hypertension associated with diabetes [facility])   [place] [facility] Larae Grooms, NP       Future Appointments             In 2 weeks Gollan, Tollie Pizza, MD [facility] [place]   In 2 months Larae Grooms, NP [place] [facility]

## 2023-01-06 ENCOUNTER — Other Ambulatory Visit: Payer: Self-pay | Admitting: Nurse Practitioner

## 2023-01-06 NOTE — Telephone Encounter (Signed)
Requested Prescriptions  Pending Prescriptions Disp Refills   pantoprazole (PROTONIX) 40 MG tablet [Pharmacy Med Name: PANTOPRAZOLE SOD DR 40 MG TAB] 180 tablet 0    Sig: TAKE 1 TABLET (40 MG TOTAL) BY MOUTH TWICE A DAY BEFORE MEALS     Gastroenterology: Proton Pump Inhibitors Passed - 01/06/2023  1:37 AM      Passed - Valid encounter within last 12 months    Recent Outpatient Visits           1 month ago Hypertension associated with diabetes [facility])   [place] [facility] [place], Megan P, DO   4 months ago Hypertension associated with diabetes [facility])   [place] [facility] Larae Grooms, NP   5 months ago Acute intractable headache, unspecified headache type   [place] [facility] Larae Grooms, NP   6 months ago Constipation, unspecified constipation type   [place] [facility] Larae Grooms, NP   7 months ago Hypertension associated with diabetes [facility])   [place] [facility] Larae Grooms, NP       Future Appointments             In 1 week Gollan, Tollie Pizza, MD [place] [facility] [place]   In 1 month Larae Grooms, NP [place] [facility], [facility]

## 2023-01-13 NOTE — Progress Notes (Unsigned)
Cardiology Office Note  Date:  01/13/2023   ID:  Anna Juarez, DOB 21-May-1950, MRN [id number]  PCP:  Larae Grooms, NP   No chief complaint on file.   HPI:  [name] is a 72 year old woman with past medical history of Hypertension hepatic cirrhosis MGUS diabetes type 2 abdominal pain Cardioversion for atrial fibrillation December 09, 2022 Presenting for follow-up of her atrial fibrillation  Last seen by myself in clinic November 30, 2022  Underwent cardioversion December 09, 2022, normal sinus rhythm restored  Echocardiogram Normal left and right ventricular size and function Mild to moderate mitral valve regurgitation Mildly dilated left atrium  Reports having some shortness of breath and cough when laying down at night Weight unchanged from June 2024, last clinic visit Appreciates palpitations  Poor mobility, uses a wheelchair Able to walk short distances  hospital September 15, 2022 for endoscopy Atrial fibrillation documented on EKG September 15, 2022 Prior EKG November 2022 showing normal sinus rhythm  EKG personally reviewed by myself on todays visit      PMH:   has a past medical history of Arthritis, Chronic kidney disease, Complication of anesthesia, Diabetes mellitus without complication (HCC), Extreme obesity, Fatty liver, GERD (gastroesophageal reflux disease), Hyperlipidemia, Hypertension, Palpitations, PONV (postoperative nausea and vomiting), Radiculopathy, cervical, Renal insufficiency, Sleep apnea, and Tear of acetabular labrum (01/2021).  PSH:    Past Surgical History:  Procedure Laterality Date   ABDOMINAL HYSTERECTOMY     CARDIAC CATHETERIZATION     [facility]   CARDIOVERSION N/A 12/09/2022   Procedure: CARDIOVERSION;  Surgeon: Antonieta Iba, MD;  Location: [facility] ORS;  Service: Cardiovascular;  Laterality: N/A;   CATARACT EXTRACTION W/PHACO Right 06/02/2021   Procedure: CATARACT EXTRACTION PHACO AND INTRAOCULAR LENS PLACEMENT (IOC) RIGHT DIABETIC;   Surgeon: Nevada Crane, MD;  Location: [facility];  Service: Ophthalmology;  Laterality: Right;  2.52 0:22.8   CATARACT EXTRACTION W/PHACO Left 06/16/2021   Procedure: CATARACT EXTRACTION PHACO AND INTRAOCULAR LENS PLACEMENT (IOC) LEFT DIABETIC 3.51 00:30.9;  Surgeon: Nevada Crane, MD;  [facility];  Service: Ophthalmology;  Laterality: Left;   CERVICAL FUSION     x2   CHOLECYSTECTOMY     COLONOSCOPY     COLONOSCOPY WITH PROPOFOL N/A 09/09/2021   Procedure: COLONOSCOPY WITH PROPOFOL;  Surgeon: [name], MD;  Location: [facility];  Service: Endoscopy;  Laterality: N/A;   ESOPHAGOGASTRODUODENOSCOPY (EGD) WITH PROPOFOL N/A 09/09/2021   Procedure: ESOPHAGOGASTRODUODENOSCOPY (EGD) WITH PROPOFOL;  Surgeon: [name], MD;  Location: [facility] ENDOSCOPY;  Service: Endoscopy;  Laterality: N/A;   ESOPHAGOGASTRODUODENOSCOPY (EGD) WITH PROPOFOL N/A 09/15/2022   Procedure: ESOPHAGOGASTRODUODENOSCOPY (EGD) WITH PROPOFOL;  Surgeon: [name], MD;  Location: [facility] ENDOSCOPY;  Service: Endoscopy;  Laterality: N/A;   JOINT REPLACEMENT Bilateral    knees    Current Outpatient Medications  Medication Sig Dispense Refill   ACCU-CHEK GUIDE test strip USE AS INSTRUCTED 100 strip 0   acetaminophen (TYLENOL) 500 MG tablet Take 2 tablets (1,000 mg total) by mouth every 6 (six) hours as needed for mild pain.     apixaban (ELIQUIS) 5 MG TABS tablet Take 1 tablet (5 mg total) by mouth 2 (two) times daily. 180 tablet 3   atorvastatin (LIPITOR) 40 MG tablet Take 1 tablet (40 mg total) by mouth once a week. 52 tablet 0   benazepril (LOTENSIN) 20 MG tablet Take 1 tablet (20 mg total) by mouth daily. 90 tablet 3   dapagliflozin propanediol (FARXIGA) 10 MG TABS tablet  TAKE 1 TABLET BY MOUTH EVERY DAY BEFORE BREAKFAST 90 tablet 1   linaclotide (LINZESS) 145 MCG CAPS capsule Take 1 capsule (145 mcg total) by mouth daily before breakfast. 90 capsule 1   metoprolol succinate  (TOPROL-XL) 50 MG 24 hr tablet Take 1 tablet (50 mg total) by mouth daily. TAKE WITH OR IMMEDIATELY FOLLOWING A MEAL in AM and take 25 mg ( 0.5 tablet) in the PM 135 tablet 3   ondansetron (ZOFRAN) 4 MG tablet TAKE 1 TABLET BY MOUTH EVERY 6 HOURS. 18 tablet 1   pantoprazole (PROTONIX) 40 MG tablet TAKE 1 TABLET (40 MG TOTAL) BY MOUTH TWICE A DAY BEFORE MEALS 180 tablet 0   pregabalin (LYRICA) 150 MG capsule TAKE 1 CAPSULE BY MOUTH 3 TIMES DAILY. 270 capsule 1   SODIUM FLUORIDE 5000 PPM 1.1 % PSTE See admin instructions. (Patient not taking: Reported on 12/09/2022)     traMADol (ULTRAM) 50 MG tablet Take 1 tablet (50 mg total) by mouth every 8 (eight) hours as needed. 60 tablet 2   traZODone (DESYREL) 100 MG tablet TAKE 1 TABLET (100 MG TOTAL) BY MOUTH AT BEDTIME AS NEEDED. FOR SLEEP 90 tablet 0   No current facility-administered medications for this visit.    Allergies:   Actos [pioglitazone], Metformin and related, Scopolamine, and Victoza [liraglutide]   Social History:  The patient  reports that she has never smoked. She has never been exposed to tobacco smoke. She has never used smokeless tobacco. She reports that she does not drink alcohol and does not use drugs.   Family History:   family history includes Breast cancer in her maternal aunt and paternal aunt; Cancer in her brother; Diabetes in her mother; Heart disease in her father and mother; Hypertension in her brother and mother; Stroke in her daughter; Stroke (age of onset: 27) in her sister.    Review of Systems: Review of Systems  Constitutional: Negative.   HENT: Negative.    Respiratory:  Positive for shortness of breath.   Cardiovascular:  Positive for palpitations.  Gastrointestinal: Negative.   Musculoskeletal: Negative.   Neurological: Negative.   Psychiatric/Behavioral: Negative.    All other systems reviewed and are negative.    PHYSICAL EXAM: VS:  There were no vitals taken for this visit. , BMI There is no height  or weight on file to calculate BMI. Constitutional:  oriented to person, place, and time. No distress.  HENT:  Head: Grossly normal Eyes:  no discharge. No scleral icterus.  Neck: No JVD, no carotid bruits  Cardiovascular: Irregularly irregular no murmurs appreciated Pulmonary/Chest: Clear to auscultation bilaterally, no wheezes or rails Abdominal: Soft.  no distension.  no tenderness.  Musculoskeletal: Normal range of motion Neurological:  normal muscle tone. Coordination normal. No atrophy Skin: Skin warm and dry Psychiatric: normal affect, pleasant  Recent Labs: 11/26/2022: ALT 16; BUN 14; Creatinine, Ser 1.45; Hemoglobin 15.2; Platelets 175; Potassium 4.2; Sodium 141    Lipid Panel Lab Results  Component Value Date   CHOL 227 (H) 11/26/2022   HDL 51 11/26/2022   LDLCALC 134 (H) 11/26/2022   TRIG 234 (H) 11/26/2022      Wt Readings from Last 3 Encounters:  12/09/22 290 lb (131.5 kg)  11/30/22 291 lb 8 oz (132.2 kg)  11/26/22 290 lb 3.2 oz (131.6 kg)       ASSESSMENT AND PLAN:  Problem List Items Addressed This Visit   None    New onset atrial fibrillation Noted at the time  of her EGD several months ago Recommend she increase metoprolol succinate up to 50 in the morning 25 in the evening Continue Eliquis 5 twice daily We will schedule a cardioversion  Left chest pain Atypical in nature, likely rib wall pain, musculoskeletal Symptoms improved  Essential hypertension Recommend she decrease benazepril down to 20 mg daily Add additional metoprolol succinate 25 mg for total of 75 mg daily Diabetes type 2 A1c 7.5, low carbohydrate diet recommended    Total encounter time more than 30 minutes  Greater than 50% was spent in counseling and coordination of care with the patient    Signed, Dossie Arbour, M.D., Ph.D. [facility] [place], [place] [phone]

## 2023-01-14 ENCOUNTER — Encounter: Payer: Self-pay | Admitting: Cardiovascular Disease

## 2023-01-14 ENCOUNTER — Ambulatory Visit: Payer: Medicare PPO | Attending: Cardiovascular Disease | Admitting: Cardiovascular Disease

## 2023-01-14 VITALS — BP 132/70 | HR 52 | Ht 62.0 in | Wt 290.5 lb

## 2023-01-14 DIAGNOSIS — E1142 Type 2 diabetes mellitus with diabetic polyneuropathy: Secondary | ICD-10-CM | POA: Diagnosis not present

## 2023-01-14 DIAGNOSIS — N1832 Chronic kidney disease, stage 3b: Secondary | ICD-10-CM

## 2023-01-14 DIAGNOSIS — I10 Essential (primary) hypertension: Secondary | ICD-10-CM

## 2023-01-14 DIAGNOSIS — E1159 Type 2 diabetes mellitus with other circulatory complications: Secondary | ICD-10-CM

## 2023-01-14 DIAGNOSIS — I152 Hypertension secondary to endocrine disorders: Secondary | ICD-10-CM | POA: Diagnosis not present

## 2023-01-14 DIAGNOSIS — G4733 Obstructive sleep apnea (adult) (pediatric): Secondary | ICD-10-CM | POA: Diagnosis not present

## 2023-01-14 DIAGNOSIS — E785 Hyperlipidemia, unspecified: Secondary | ICD-10-CM

## 2023-01-14 DIAGNOSIS — E1169 Type 2 diabetes mellitus with other specified complication: Secondary | ICD-10-CM

## 2023-01-14 DIAGNOSIS — I4819 Other persistent atrial fibrillation: Secondary | ICD-10-CM

## 2023-01-14 NOTE — Patient Instructions (Addendum)
Medication Instructions:  Monitor heart rate For rates in the 40s, Please cut the morning metoprolol down to 1/2 pill (50 down to 25 mg), stay on 25 in the PM  If you need a refill on your cardiac medications before your next appointment, please call your pharmacy.   Lab work: No new labs needed  Testing/Procedures: No new testing needed  Follow-Up: At [facility], you and your health needs are our priority.  As part of our continuing mission to provide you with exceptional heart care, we have created designated Provider Care Teams.  These Care Teams include your primary Cardiologist (physician) and Advanced Practice Providers (APPs -  Physician Assistants and Nurse Practitioners) who all work together to provide you with the care you need, when you need it.  You will need a follow up appointment in 6 months  Providers on your designated Care Team:   Nicolasa Ducking, NP Eula Listen, PA-C Cadence Fransico [place], [place]  COVID-19 Vaccine Information can be found at: [email or web address] For questions related to vaccine distribution or appointments, please email [email or web address] or call [phone].

## 2023-01-22 DIAGNOSIS — G4733 Obstructive sleep apnea (adult) (pediatric): Secondary | ICD-10-CM | POA: Diagnosis not present

## 2023-01-29 ENCOUNTER — Ambulatory Visit: Payer: Medicare PPO | Admitting: Internal Medicine

## 2023-01-29 ENCOUNTER — Other Ambulatory Visit: Payer: Medicare PPO

## 2023-01-31 ENCOUNTER — Other Ambulatory Visit: Payer: Self-pay | Admitting: Physician Assistant

## 2023-02-01 NOTE — Telephone Encounter (Signed)
Requested Prescriptions  Pending Prescriptions Disp Refills   traZODone (DESYREL) 100 MG tablet [Pharmacy Med Name: TRAZODONE 100 MG TABLET] 90 tablet 0    Sig: TAKE 1 TABLET (100 MG TOTAL) BY MOUTH AT BEDTIME AS NEEDED. FOR SLEEP     Psychiatry: Antidepressants - Serotonin Modulator Passed - 01/31/2023  1:30 AM      Passed - Valid encounter within last 6 months    Recent Outpatient Visits           2 months ago Hypertension associated with diabetes [facility])   [place] [facility] [place], Megan P, DO   5 months ago Hypertension associated with diabetes [facility])   [place] [facility] Larae Grooms, NP   6 months ago Acute intractable headache, unspecified headache type   [place] [facility] Larae Grooms, NP   7 months ago Constipation, unspecified constipation type   [place] [facility] Larae Grooms, NP   8 months ago Hypertension associated with diabetes [facility])   [place] [facility] Larae Grooms, NP       Future Appointments             In 4 weeks Larae Grooms, NP  [facility], [facility]

## 2023-02-14 ENCOUNTER — Other Ambulatory Visit: Payer: Self-pay | Admitting: Family Medicine

## 2023-02-14 DIAGNOSIS — G8929 Other chronic pain: Secondary | ICD-10-CM

## 2023-02-15 ENCOUNTER — Inpatient Hospital Stay (HOSPITAL_BASED_OUTPATIENT_CLINIC_OR_DEPARTMENT_OTHER): Payer: Medicare PPO | Admitting: Internal Medicine

## 2023-02-15 ENCOUNTER — Inpatient Hospital Stay: Payer: Medicare PPO | Attending: Internal Medicine

## 2023-02-15 ENCOUNTER — Encounter: Payer: Self-pay | Admitting: Internal Medicine

## 2023-02-15 VITALS — BP 136/55 | HR 56 | Temp 96.9°F | Resp 17 | Wt 290.0 lb

## 2023-02-15 DIAGNOSIS — E1122 Type 2 diabetes mellitus with diabetic chronic kidney disease: Secondary | ICD-10-CM | POA: Insufficient documentation

## 2023-02-15 DIAGNOSIS — M1990 Unspecified osteoarthritis, unspecified site: Secondary | ICD-10-CM | POA: Diagnosis not present

## 2023-02-15 DIAGNOSIS — I129 Hypertensive chronic kidney disease with stage 1 through stage 4 chronic kidney disease, or unspecified chronic kidney disease: Secondary | ICD-10-CM | POA: Insufficient documentation

## 2023-02-15 DIAGNOSIS — D472 Monoclonal gammopathy: Secondary | ICD-10-CM

## 2023-02-15 DIAGNOSIS — N183 Chronic kidney disease, stage 3 unspecified: Secondary | ICD-10-CM | POA: Insufficient documentation

## 2023-02-15 LAB — CBC WITH DIFFERENTIAL (CANCER CENTER ONLY)
Abs Immature Granulocytes: 0.02 10*3/uL (ref 0.00–0.07)
Basophils Absolute: 0 10*3/uL (ref 0.0–0.1)
Basophils Relative: 0 %
Eosinophils Absolute: 0.1 10*3/uL (ref 0.0–0.5)
Eosinophils Relative: 1 %
HCT: 46.1 % — ABNORMAL HIGH (ref 36.0–46.0)
Hemoglobin: 14.9 g/dL (ref 12.0–15.0)
Immature Granulocytes: 0 %
Lymphocytes Relative: 26 %
Lymphs Abs: 1.8 10*3/uL (ref 0.7–4.0)
MCH: 30.9 pg (ref 26.0–34.0)
MCHC: 32.3 g/dL (ref 30.0–36.0)
MCV: 95.6 fL (ref 80.0–100.0)
Monocytes Absolute: 0.5 10*3/uL (ref 0.1–1.0)
Monocytes Relative: 7 %
Neutro Abs: 4.4 10*3/uL (ref 1.7–7.7)
Neutrophils Relative %: 66 %
Platelet Count: 174 10*3/uL (ref 150–400)
RBC: 4.82 MIL/uL (ref 3.87–5.11)
RDW: 12.5 % (ref 11.5–15.5)
WBC Count: 6.9 10*3/uL (ref 4.0–10.5)
nRBC: 0 % (ref 0.0–0.2)

## 2023-02-15 LAB — CMP (CANCER CENTER ONLY)
ALT: 21 U/L (ref 0–44)
AST: 36 U/L (ref 15–41)
Albumin: 4 g/dL (ref 3.5–5.0)
Alkaline Phosphatase: 66 U/L (ref 38–126)
Anion gap: 12 (ref 5–15)
BUN: 18 mg/dL (ref 8–23)
CO2: 22 mmol/L (ref 22–32)
Calcium: 8.6 mg/dL — ABNORMAL LOW (ref 8.9–10.3)
Chloride: 103 mmol/L (ref 98–111)
Creatinine: 1.44 mg/dL — ABNORMAL HIGH (ref 0.44–1.00)
GFR, Estimated: 38 mL/min — ABNORMAL LOW (ref >60)
Glucose, Bld: 241 mg/dL — ABNORMAL HIGH (ref 70–99)
Potassium: 4.1 mmol/L (ref 3.5–5.1)
Sodium: 137 mmol/L (ref 135–145)
Total Bilirubin: 1 mg/dL (ref <1.2)
Total Protein: 7.3 g/dL (ref 6.5–8.1)

## 2023-02-15 NOTE — Progress Notes (Signed)
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  Walking independently.   Anna Juarez 72 y.o.  female history of monoclonal gammopathy of unknown significance is here for follow-up.  sensitive stomach, eating has improved. Energy is okay. Left shoulder pain. Linzess is helping her bowels not to be comstipated. Denies cough..    Patient continues to have chronic back pain/ shoudler joint pains. Denies any tingling or numbness.  Denies any weight loss.  Patient also has chronic abdominal pain not any worse.    Review of Systems  Constitutional:  Positive for malaise/fatigue. Negative for chills, diaphoresis, fever and weight loss.  HENT:  Negative for nosebleeds and sore throat.   Eyes:  Negative for double vision.  Respiratory:  Negative for cough, hemoptysis, sputum production, shortness of breath and wheezing.   Cardiovascular:  Negative for chest pain, palpitations, orthopnea and leg swelling.  Gastrointestinal:  Negative for blood in stool, constipation, diarrhea, heartburn, melena, nausea and vomiting.  Genitourinary:  Negative for dysuria, frequency and urgency.  Musculoskeletal:  Positive for back pain and joint pain.  Skin: Negative.  Negative for itching and rash.  Neurological:  Negative for dizziness, tingling, focal weakness, weakness and headaches.  Endo/Heme/Allergies:  Does not bruise/bleed easily.  Psychiatric/Behavioral:  Negative for depression. The patient is not nervous/anxious and does not have insomnia.       MEDICAL HISTORY:  Past Medical History:  Diagnosis Date   Arthritis    Chronic kidney disease    Complication of anesthesia    Diabetes mellitus without complication (HCC)    Extreme obesity    Fatty liver    GERD (gastroesophageal reflux disease)    Hyperlipidemia    Hypertension    Palpitations    PONV (postoperative nausea and vomiting)    Radiculopathy, cervical    Renal insufficiency    stage 3   Sleep apnea    uses cpap   Tear of acetabular labrum 01/2021   right    SURGICAL HISTORY: Past Surgical History:  Procedure Laterality Date   ABDOMINAL HYSTERECTOMY     CARDIAC CATHETERIZATION     [facility]   CARDIOVERSION N/A 12/09/2022   Procedure: CARDIOVERSION;  Surgeon: Antonieta Iba, MD;  Location: [facility] ORS;  Service: Cardiovascular;  Laterality: N/A;   CATARACT EXTRACTION W/PHACO Right 06/02/2021   Procedure: CATARACT EXTRACTION PHACO AND INTRAOCULAR LENS PLACEMENT (IOC) RIGHT DIABETIC;  Surgeon: Nevada Crane, MD;  Location: [facility];  Service: Ophthalmology;  Laterality: Right;  2.52 0:22.8   CATARACT EXTRACTION W/PHACO Left 06/16/2021   Procedure: CATARACT EXTRACTION PHACO AND INTRAOCULAR LENS PLACEMENT (IOC) LEFT DIABETIC 3.51 00:30.9;  Surgeon: Nevada Crane, MD;  Location: [facility];  Service: Ophthalmology;  Laterality: Left;   CERVICAL FUSION     x2   CHOLECYSTECTOMY     COLONOSCOPY     COLONOSCOPY WITH PROPOFOL N/A 09/09/2021   Procedure: COLONOSCOPY WITH PROPOFOL;  Surgeon: [name], MD;  Location: [facility] ENDOSCOPY;  Service: Endoscopy;  Laterality: N/A;   ESOPHAGOGASTRODUODENOSCOPY (EGD) WITH PROPOFOL N/A 09/09/2021   Procedure: ESOPHAGOGASTRODUODENOSCOPY (EGD) WITH PROPOFOL;  Surgeon: [name],  MD;  Location: [facility] ENDOSCOPY;  Service: Endoscopy;  Laterality: N/A;   ESOPHAGOGASTRODUODENOSCOPY (EGD) WITH PROPOFOL N/A 09/15/2022   Procedure: ESOPHAGOGASTRODUODENOSCOPY (EGD) WITH PROPOFOL;  Surgeon: [name], MD;   Location: [facility] ENDOSCOPY;  Service: Endoscopy;  Laterality: N/A;   JOINT REPLACEMENT Bilateral    knees    SOCIAL HISTORY: Social History   Socioeconomic History   Marital status: Widowed    Spouse name: Not on file   Number of children: Not on file   Years of education: Not on file   Highest education level: 12th grade  Occupational History   Occupation: retired   Tobacco Use   Smoking status: Never    Passive exposure: Never   Smokeless tobacco: Never  Vaping Use   Vaping status: Never Used  Substance and Sexual Activity   Alcohol use: No   Drug use: No   Sexual activity: Not Currently  Other Topics Concern   Not on file  Social History Narrative   [place]; lives alone; never smoker; no alcohol; retitred.   Social Determinants of Health   Financial Resource Strain: Low Risk  (10/05/2022)   Overall Financial Resource Strain (CARDIA)    Difficulty of Paying Living Expenses: Not hard at all  Food Insecurity: No Food Insecurity (10/05/2022)   Hunger Vital Sign    Worried About Running Out of Food in the Last Year: Never true    Ran Out of Food in the Last Year: Never true  Transportation Needs: No Transportation Needs (10/05/2022)   PRAPARE - Administrator, Civil Service (Medical): No    Lack of Transportation (Non-Medical): No  Physical Activity: Inactive (10/05/2022)   Exercise Vital Sign    Days of Exercise per Week: 0 days    Minutes of Exercise per Session: 0 min  Stress: No Stress Concern Present (10/05/2022)   [employer] of Occupational Health - Occupational Stress Questionnaire    Feeling of Stress : Not at all  Social Connections: Moderately Isolated (10/05/2022)   Social Connection and Isolation Panel [NHANES]    Frequency of Communication with Friends and Family: More than three times a week    Frequency of Social Gatherings with Friends and Family: Once a week    Attends Religious Services: More than 4 times per year    Active  Member of [employer] or Organizations: No    Attends Banker Meetings: Never    Marital Status: Widowed  Intimate Partner Violence: Not At Risk (10/05/2022)   Humiliation, Afraid, Rape, and Kick questionnaire    Fear of Current or Ex-Partner: No    Emotionally Abused: No    Physically Abused: No    Sexually Abused: No    FAMILY HISTORY: Family History  Problem Relation Age of Onset   Diabetes Mother    Heart disease Mother    Hypertension Mother    Heart disease Father    Stroke Sister 32   Cancer Brother    Hypertension Brother    Breast cancer Maternal Aunt    Breast cancer Paternal Aunt    Stroke Daughter     ALLERGIES:  is allergic to actos [pioglitazone], metformin and related, scopolamine, and victoza [liraglutide].  MEDICATIONS:  Current Outpatient Medications  Medication Sig Dispense Refill   ACCU-CHEK GUIDE test strip USE AS INSTRUCTED 100 strip 0   acetaminophen (TYLENOL) 500 MG tablet Take 2 tablets (1,000 mg total) by mouth every 6 (six) hours as needed for mild pain.  apixaban (ELIQUIS) 5 MG TABS tablet Take 1 tablet (5 mg total) by mouth 2 (two) times daily. 180 tablet 3   atorvastatin (LIPITOR) 40 MG tablet Take 1 tablet (40 mg total) by mouth once a week. 52 tablet 0   benazepril (LOTENSIN) 20 MG tablet Take 1 tablet (20 mg total) by mouth daily. 90 tablet 3   benazepril (LOTENSIN) 40 MG tablet Take 40 mg by mouth daily.     dapagliflozin propanediol (FARXIGA) 10 MG TABS tablet TAKE 1 TABLET BY MOUTH EVERY DAY BEFORE BREAKFAST 90 tablet 1   linaclotide (LINZESS) 145 MCG CAPS capsule Take 1 capsule (145 mcg total) by mouth daily before breakfast. 90 capsule 1   metoprolol succinate (TOPROL-XL) 50 MG 24 hr tablet Take 1 tablet (50 mg total) by mouth daily. TAKE WITH OR IMMEDIATELY FOLLOWING A MEAL in AM and take 25 mg ( 0.5 tablet) in the PM 135 tablet 3   ondansetron (ZOFRAN) 4 MG tablet TAKE 1 TABLET BY MOUTH EVERY 6 HOURS. 18 tablet 1   pantoprazole  (PROTONIX) 40 MG tablet TAKE 1 TABLET (40 MG TOTAL) BY MOUTH TWICE A DAY BEFORE MEALS 180 tablet 0   pregabalin (LYRICA) 150 MG capsule TAKE 1 CAPSULE BY MOUTH 3 TIMES DAILY. 270 capsule 1   SODIUM FLUORIDE 5000 PPM 1.1 % PSTE See admin instructions.     traMADol (ULTRAM) 50 MG tablet Take 1 tablet (50 mg total) by mouth every 8 (eight) hours as needed. 60 tablet 2   No current facility-administered medications for this visit.      Marland Kitchen  PHYSICAL EXAMINATION:  Vitals:   02/15/23 1321  BP: (!) 136/55  Pulse: (!) 56  Resp: 17  Temp: (!) 96.9 F (36.1 C)  SpO2: 97%   Filed Weights   02/15/23 1321  Weight: 290 lb (131.5 kg)    Physical Exam Vitals and nursing note reviewed.  HENT:     Head: Normocephalic and atraumatic.     Mouth/Throat:     Pharynx: Oropharynx is clear.  Eyes:     Extraocular Movements: Extraocular movements intact.     Pupils: Pupils are equal, round, and reactive to light.  Cardiovascular:     Rate and Rhythm: Normal rate and regular rhythm.  Pulmonary:     Comments: Decreased breath sounds bilaterally.  Abdominal:     Palpations: Abdomen is soft.  Musculoskeletal:        General: Normal range of motion.     Cervical back: Normal range of motion.  Skin:    General: Skin is warm.  Neurological:     General: No focal deficit present.     Mental Status: She is alert and oriented to person, place, and time.  Psychiatric:        Behavior: Behavior normal.        Judgment: Judgment normal.      LABORATORY DATA:  I have reviewed the data as listed Lab Results  Component Value Date   WBC 6.9 02/15/2023   HGB 14.9 02/15/2023   HCT 46.1 (H) 02/15/2023   MCV 95.6 02/15/2023   PLT 174 02/15/2023   Recent Labs    08/10/22 1452 08/26/22 1514 11/26/22 1117 02/15/23 1301  NA 137 141 141 137  K 4.0 4.7 4.2 4.1  CL 102 101 102 103  CO2 26 24 23 22   GLUCOSE 141* 127* 124* 241*  BUN 17 22 14 18   CREATININE 1.35* 1.49* 1.45* 1.44*  CALCIUM 8.9  9.6  9.1 8.6*  GFRNONAA 41*  --   --  38*  PROT 7.3 6.9 7.0 7.3  ALBUMIN 4.1 4.3 4.3 4.0  AST 22 17 29  36  ALT 11 10 16 21   ALKPHOS 64 75 69 66  BILITOT 0.7 0.6 0.6 1.0    RADIOGRAPHIC STUDIES: I have personally reviewed the radiological images as listed and agreed with the findings in the report. No results found.  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 CBC-hemoglobin normal; normal calcium.  I reviewed the natural history of MGUS; low risk of transformation to multiple myeloma.MM Labs pending today.   # Stage 3b chronic kidney disease- GFR 38 - stable. [Dr.[name]]   # Poorly controlled DM- HbA1c- 7.4-recommend close monitoring of blood sugars/compliance with medications. Stable.   # Hip pain- hip MRI w/o contrast [PCP]-10/26-MRI hip shows no evidence or concerns of myelomatous involvement; more consistent with osteoarthritis. Stable.   # DISPOSITION: # follow up in 6 months- MD; labs- cbc/cmp;MM panel; K/l light chains-Dr.B   All questions were answered. The patient knows to call the clinic with any problems, questions or concerns.    Earna Coder, MD 02/15/2023 2:03 PM

## 2023-02-15 NOTE — Progress Notes (Signed)
Sensitive stomach, eating has improved. Energy is okay. Left shoulder pain.  Linzess is helping her bowels not to be comstipated. Denies cough.

## 2023-02-15 NOTE — Assessment & Plan Note (Signed)
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 CBC-hemoglobin normal; normal calcium.  I reviewed the natural history of MGUS; low risk of transformation to multiple myeloma.MM Labs pending today.   # Stage 3b chronic kidney disease- GFR 38 - stable. [Dr.[name]]   # Poorly controlled DM- HbA1c- 7.4-recommend close monitoring of blood sugars/compliance with medications. Stable.   # Hip pain- hip MRI w/o contrast [PCP]-10/26-MRI hip shows no evidence or concerns of myelomatous involvement; more consistent with osteoarthritis. Stable.   # DISPOSITION: # follow up in 6 months- MD; labs- cbc/cmp;MM panel; K/l light chains-Dr.B

## 2023-02-16 LAB — KAPPA/LAMBDA LIGHT CHAINS
Kappa free light chain: 21.5 mg/L — ABNORMAL HIGH (ref 3.3–19.4)
Kappa, lambda light chain ratio: 0.11 — ABNORMAL LOW (ref 0.26–1.65)
Lambda free light chains: 204.6 mg/L — ABNORMAL HIGH (ref 5.7–26.3)

## 2023-02-16 NOTE — Telephone Encounter (Signed)
Requested medication (s) are due for refill today:   Provider to review  Requested medication (s) are on the active medication list:   Yes  Future visit scheduled:   Yes 11/25   Last ordered: 12/24/2022 #60, 2 refills  Non delegated refill   Requested Prescriptions  Pending Prescriptions Disp Refills   traMADol (ULTRAM) 50 MG tablet [Pharmacy Med Name: TRAMADOL HCL 50 MG TABLET] 60 tablet 2    Sig: TAKE 1 TABLET BY MOUTH EVERY 8 HOURS AS NEEDED.     Not Delegated - Analgesics:  Opioid Agonists Failed - 02/14/2023 10:33 PM      Failed - This refill cannot be delegated      Failed - Urine Drug Screen completed in last 360 days      Passed - Valid encounter within last 3 months    Recent Outpatient Visits           2 months ago Hypertension associated with diabetes [facility])   [place] [facility] [place], Megan P, DO   5 months ago Hypertension associated with diabetes [facility])   [facility] Larae Grooms, NP   6 months ago Acute intractable headache, unspecified headache type   [place] [facility] Larae Grooms, NP   7 months ago Constipation, unspecified constipation type   [place] [facility] Larae Grooms, NP   8 months ago Hypertension associated with diabetes [facility])   [place] [facility] Larae Grooms, NP       Future Appointments             In 1 week Larae Grooms, NP [place] [facility], [facility]

## 2023-02-18 ENCOUNTER — Encounter: Payer: Self-pay | Admitting: Internal Medicine

## 2023-02-18 ENCOUNTER — Other Ambulatory Visit: Payer: Self-pay | Admitting: Nurse Practitioner

## 2023-02-18 DIAGNOSIS — G8929 Other chronic pain: Secondary | ICD-10-CM

## 2023-02-18 LAB — MULTIPLE MYELOMA PANEL, SERUM
Albumin SerPl Elph-Mcnc: 3.7 g/dL (ref 2.9–4.4)
Albumin/Glob SerPl: 1.4 (ref 0.7–1.7)
Alpha 1: 0.2 g/dL (ref 0.0–0.4)
Alpha2 Glob SerPl Elph-Mcnc: 0.8 g/dL (ref 0.4–1.0)
B-Globulin SerPl Elph-Mcnc: 1.2 g/dL (ref 0.7–1.3)
Gamma Glob SerPl Elph-Mcnc: 0.6 g/dL (ref 0.4–1.8)
Globulin, Total: 2.8 g/dL (ref 2.2–3.9)
IgA: 358 mg/dL (ref 64–422)
IgG (Immunoglobin G), Serum: 791 mg/dL (ref 586–1602)
IgM (Immunoglobulin M), Srm: 53 mg/dL (ref 26–217)
M Protein SerPl Elph-Mcnc: 0.1 g/dL — ABNORMAL HIGH
Total Protein ELP: 6.5 g/dL (ref 6.0–8.5)

## 2023-02-18 NOTE — Telephone Encounter (Signed)
Requested medications are due for refill today.  Unsure  Requested medications are on the active medications list.  yes  Last refill. 12/24/2022 #60 2 rf  Future visit scheduled.   yes  Notes to clinic.  Refill/refusal not delegated.    Requested Prescriptions  Pending Prescriptions Disp Refills   traMADol (ULTRAM) 50 MG tablet 60 tablet 2    Sig: Take 1 tablet (50 mg total) by mouth every 8 (eight) hours as needed.     Not Delegated - Analgesics:  Opioid Agonists Failed - 02/18/2023 10:50 AM      Failed - This refill cannot be delegated      Failed - Urine Drug Screen completed in last 360 days      Passed - Valid encounter within last 3 months    Recent Outpatient Visits           2 months ago Hypertension associated with diabetes [facility])   [place] [facility], Megan P, DO   5 months ago Hypertension associated with diabetes [facility])   [place] [facility] Larae Grooms, NP   7 months ago Acute intractable headache, unspecified headache type   [place] [facility] Larae Grooms, NP   7 months ago Constipation, unspecified constipation type   [place] [facility] Larae Grooms, NP   8 months ago Hypertension associated with diabetes [facility])   [place] [facility] Larae Grooms, NP       Future Appointments             In 1 week Larae Grooms, NP [place] [facility], [facility]

## 2023-02-18 NOTE — Telephone Encounter (Signed)
Medication Refill -  Most Recent Primary Care Visit:  Provider: Dorcas Carrow  Department: [facility]  Visit Type: OFFICE VISIT  Date: 11/26/2022  Medication: traMADol (ULTRAM) 50 MG tablet   Has the patient contacted their pharmacy? yes (Agent: If yes, when and what did the pharmacy advise?)contact pcp  Is this the correct pharmacy for this prescription? yes   This is the patient's preferred pharmacy:  [facility] [phone] Dan Humphreys, [place] - [address] [place] [address] Phone: [phone] Fax: [phone]   Has the prescription been filled recently? no Pt hurt shoulder so was taking 3 a day for a week , is why she has ran out  Is the patient out of the medication? yes  Has the patient been seen for an appointment in the last year OR does the patient have an upcoming appointment? yes  Can we respond through MyChart? yes  Agent: Please be advised that Rx refills may take up to 3 business days. We ask that you follow-up with your pharmacy.

## 2023-02-19 ENCOUNTER — Other Ambulatory Visit: Payer: Self-pay | Admitting: Nurse Practitioner

## 2023-02-19 DIAGNOSIS — E119 Type 2 diabetes mellitus without complications: Secondary | ICD-10-CM

## 2023-02-19 NOTE — Telephone Encounter (Signed)
Requested medication (s) are due for refill today: Yes  Requested medication (s) are on the active medication list: Yes  Last refill:  11/24/22  Future visit scheduled: Yes  Notes to clinic:  No protocol.    Requested Prescriptions  Pending Prescriptions Disp Refills   ACCU-CHEK GUIDE TEST test strip [Pharmacy Med Name: ACCU-CHEK GUIDE TEST STRIP] 100 strip 0    Sig: USE AS INSTRUCTED     There is no refill protocol information for this order

## 2023-02-22 ENCOUNTER — Other Ambulatory Visit: Payer: Self-pay | Admitting: Nurse Practitioner

## 2023-02-22 DIAGNOSIS — G4733 Obstructive sleep apnea (adult) (pediatric): Secondary | ICD-10-CM | POA: Diagnosis not present

## 2023-02-22 DIAGNOSIS — E1142 Type 2 diabetes mellitus with diabetic polyneuropathy: Secondary | ICD-10-CM

## 2023-02-23 NOTE — Telephone Encounter (Signed)
Requested medication (s) are due for refill today: yes  Requested medication (s) are on the active medication list: yes  Last refill:  09/01/22 #270/1  Future visit scheduled: yes  Notes to clinic:  Unable to refill per protocol, cannot delegate.'     Requested Prescriptions  Pending Prescriptions Disp Refills   pregabalin (LYRICA) 150 MG capsule [Pharmacy Med Name: PREGABALIN 150 MG CAPSULE] 270 capsule 1    Sig: TAKE 1 CAPSULE BY MOUTH THREE TIMES A DAY     Not Delegated - Neurology:  Anticonvulsants - Controlled - pregabalin Failed - 02/22/2023  9:37 AM      Failed - This refill cannot be delegated      Failed - Cr in normal range and within 360 days    Creatinine  Date Value Ref Range Status  02/15/2023 1.44 (H) 0.44 - 1.00 mg/dL Final         Passed - Completed PHQ-2 or PHQ-9 in the last 360 days      Passed - Valid encounter within last 12 months    Recent Outpatient Visits           2 months ago Hypertension associated with diabetes [facility])   [place] [facility] [place], Megan P, DO   6 months ago Hypertension associated with diabetes [facility])   [place] [facility] Larae Grooms, NP   7 months ago Acute intractable headache, unspecified headache type   [place] [facility] Larae Grooms, NP   8 months ago Constipation, unspecified constipation type   [place] [facility] Larae Grooms, NP   9 months ago Hypertension associated with diabetes [facility])   [place] [facility] Larae Grooms, NP       Future Appointments             In 6 days Larae Grooms, NP [place] [facility], [facility]

## 2023-03-01 ENCOUNTER — Ambulatory Visit: Payer: Medicare PPO | Admitting: Nurse Practitioner

## 2023-03-01 ENCOUNTER — Encounter: Payer: Self-pay | Admitting: Nurse Practitioner

## 2023-03-01 VITALS — BP 133/70 | HR 51 | Temp 98.6°F | Ht 62.0 in | Wt 296.4 lb

## 2023-03-01 DIAGNOSIS — M545 Low back pain, unspecified: Secondary | ICD-10-CM | POA: Diagnosis not present

## 2023-03-01 DIAGNOSIS — Z1231 Encounter for screening mammogram for malignant neoplasm of breast: Secondary | ICD-10-CM | POA: Diagnosis not present

## 2023-03-01 DIAGNOSIS — Z23 Encounter for immunization: Secondary | ICD-10-CM | POA: Diagnosis not present

## 2023-03-01 DIAGNOSIS — E1142 Type 2 diabetes mellitus with diabetic polyneuropathy: Secondary | ICD-10-CM | POA: Diagnosis not present

## 2023-03-01 DIAGNOSIS — E1159 Type 2 diabetes mellitus with other circulatory complications: Secondary | ICD-10-CM | POA: Diagnosis not present

## 2023-03-01 DIAGNOSIS — I4819 Other persistent atrial fibrillation: Secondary | ICD-10-CM

## 2023-03-01 DIAGNOSIS — I152 Hypertension secondary to endocrine disorders: Secondary | ICD-10-CM

## 2023-03-01 DIAGNOSIS — E785 Hyperlipidemia, unspecified: Secondary | ICD-10-CM | POA: Diagnosis not present

## 2023-03-01 DIAGNOSIS — N1832 Chronic kidney disease, stage 3b: Secondary | ICD-10-CM | POA: Diagnosis not present

## 2023-03-01 DIAGNOSIS — Z6841 Body Mass Index (BMI) 40.0 and over, adult: Secondary | ICD-10-CM

## 2023-03-01 DIAGNOSIS — E1169 Type 2 diabetes mellitus with other specified complication: Secondary | ICD-10-CM | POA: Diagnosis not present

## 2023-03-01 DIAGNOSIS — M25512 Pain in left shoulder: Secondary | ICD-10-CM

## 2023-03-01 MED ORDER — ONDANSETRON HCL 4 MG PO TABS: 4.0000 mg | ORAL_TABLET | Freq: Four times a day (QID) | ORAL | 1 refills | Status: DC

## 2023-03-01 MED ORDER — NYSTATIN 100000 UNIT/GM EX POWD: 1.0000 | Freq: Three times a day (TID) | CUTANEOUS | 1 refills | Status: DC

## 2023-03-01 MED ORDER — PREGABALIN 150 MG PO CAPS: 150.0000 mg | ORAL_CAPSULE | Freq: Three times a day (TID) | ORAL | 1 refills | Status: DC

## 2023-03-01 MED ORDER — TRAMADOL HCL 50 MG PO TABS: 50.0000 mg | ORAL_TABLET | Freq: Three times a day (TID) | ORAL | 2 refills | Status: AC | PRN

## 2023-03-01 NOTE — Assessment & Plan Note (Signed)
Recommended eating smaller high protein, low fat meals more frequently and exercising 30 mins a day 5 times a week with a goal of 10-15lb weight loss in the next 3 months.  

## 2023-03-01 NOTE — Assessment & Plan Note (Signed)
Chronic.  Under good control on current regimen. Continue current regimen. Continue to monitor. Call with any concerns. Refills up to date. Labs drawn today.

## 2023-03-01 NOTE — Progress Notes (Signed)
BP 133/70 (BP Location: Right Arm, Patient Position: Sitting, Cuff Size: Large)   Pulse (!) 51   Temp 98.6 F (37 C) (Oral)   Ht 5\' 2"  (1.575 m)   Wt 296 lb 6.4 oz (134.4 kg)   SpO2 98%   BMI 54.21 kg/m    Subjective:    Patient ID: Anna Juarez, female    DOB: 1950-05-08, 72 y.o.   MRN: [id number]  HPI: Anna Juarez is a 72 y.o. female  Chief Complaint  Patient presents with   3 month follow up   Arm Pain    Left arm, has been going through pain for a couple weeks, just woke up one morning when pain occurred, sleeps on the arm, constant pain, burning, shooting pain, pain starts at shoulder and radiates down to the elbow    DIABETES Using Farxiga.  Hypoglycemic episodes:no Polydipsia/polyuria: no Visual disturbance: no Chest pain: no Paresthesias: no Glucose Monitoring: yes Taking Insulin?: no Blood Pressure Monitoring: not checking Retinal Examination: Up to Date Foot Exam: Up to Date Diabetic Education: Completed Pneumovax:  up to date Influenza: will get in September Aspirin: no  HYPERTENSION / HYPERLIPIDEMIA Satisfied with current treatment? yes Duration of hypertension: chronic BP monitoring frequency: not checking BP medication side effects: no Past BP meds: benazepril, metoprolol,  Duration of hyperlipidemia: chronic Cholesterol medication side effects: no Cholesterol supplements: none Medication compliance: excellent compliance Aspirin: no Recent stressors: no Recurrent headaches: no Visual changes: no Palpitations: no Dyspnea: no Chest pain: no Lower extremity edema: no Dizzy/lightheaded: no  CHRONIC PAIN  Present dose: 15 Morphine equivalents Pain control status: controlled Duration: chronic Location: back Quality: aching, sore Current Pain Level: mild Previous Pain Level: moderate Breakthrough pain: no Benefit from narcotic medications: yes What Activities task can be accomplished with current medication? Able to do her  ADLs Interested in weaning off narcotics:yes   Stool softners/OTC fiber: yes  Previous pain specialty evaluation: no Non-narcotic analgesic meds: yes Narcotic contract: yes  Patient states she is having arm pain.  She can't use her left arm.  She isn't able to use it to help get herself out of a chair.  Feels numbness and tingling down her arm.  Feels like she might have a pinched nerve.   Relevant past medical, surgical, family and social history reviewed and updated as indicated. Interim medical history since our last visit reviewed. Allergies and medications reviewed and updated.  Review of Systems  Constitutional: Negative.   Respiratory: Negative.    Cardiovascular: Negative.   Gastrointestinal:  Negative for abdominal distention, abdominal pain, anal bleeding, blood in stool, constipation, diarrhea, nausea, rectal pain and vomiting.  Musculoskeletal: Negative.        Left arm pain  Skin: Negative.   Neurological: Negative.   Psychiatric/Behavioral: Negative.      Per HPI unless specifically indicated above     Objective:    BP 133/70 (BP Location: Right Arm, Patient Position: Sitting, Cuff Size: Large)   Pulse (!) 51   Temp 98.6 F (37 C) (Oral)   Ht 5\' 2"  (1.575 m)   Wt 296 lb 6.4 oz (134.4 kg)   SpO2 98%   BMI 54.21 kg/m   Wt Readings from Last 3 Encounters:  03/01/23 296 lb 6.4 oz (134.4 kg)  02/15/23 290 lb (131.5 kg)  01/14/23 290 lb 8 oz (131.8 kg)    Physical Exam Vitals and nursing note reviewed.  Constitutional:      General: She is not  in acute distress.    Appearance: Normal appearance. She is obese. She is not ill-appearing, toxic-appearing or diaphoretic.  HENT:     Head: Normocephalic and atraumatic.     Right Ear: External ear normal.     Left Ear: External ear normal.     Nose: Nose normal.     Mouth/Throat:     Mouth: Mucous membranes are moist.     Pharynx: Oropharynx is clear.  Eyes:     General: No scleral icterus.       Right eye:  No discharge.        Left eye: No discharge.     Extraocular Movements: Extraocular movements intact.     Conjunctiva/sclera: Conjunctivae normal.     Pupils: Pupils are equal, round, and reactive to light.  Cardiovascular:     Rate and Rhythm: Normal rate and regular rhythm.     Pulses: Normal pulses.     Heart sounds: Normal heart sounds. No murmur heard.    No friction rub. No gallop.  Pulmonary:     Effort: Pulmonary effort is normal. No respiratory distress.     Breath sounds: Normal breath sounds. No stridor. No wheezing, rhonchi or rales.  Chest:     Chest wall: No tenderness.  Musculoskeletal:        General: Normal range of motion.     Cervical back: Normal range of motion and neck supple.  Skin:    General: Skin is warm and dry.     Capillary Refill: Capillary refill takes less than 2 seconds.     Coloration: Skin is not jaundiced or pale.     Findings: No bruising, erythema, lesion or rash.  Neurological:     General: No focal deficit present.     Mental Status: She is alert and oriented to person, place, and time. Mental status is at baseline.  Psychiatric:        Mood and Affect: Mood normal.        Behavior: Behavior normal.        Thought Content: Thought content normal.        Judgment: Judgment normal.     Results for orders placed or performed in visit on 02/15/23  Kappa/lambda light chains  Result Value Ref Range   Kappa free light chain 21.5 (H) 3.3 - 19.4 mg/L   Lambda free light chains 204.6 (H) 5.7 - 26.3 mg/L   Kappa, lambda light chain ratio 0.11 (L) 0.26 - 1.65  Multiple Myeloma Panel (SPEP&IFE w/QIG)  Result Value Ref Range   IgG (Immunoglobin G), Serum 791 586 - 1,602 mg/dL   IgA 284 64 - 132 mg/dL   IgM (Immunoglobulin M), Srm 53 26 - 217 mg/dL   Total Protein ELP 6.5 6.0 - 8.5 g/dL   Albumin SerPl Elph-Mcnc 3.7 2.9 - 4.4 g/dL   Alpha 1 0.2 0.0 - 0.4 g/dL   Alpha2 Glob SerPl Elph-Mcnc 0.8 0.4 - 1.0 g/dL   B-Globulin SerPl Elph-Mcnc 1.2 0.7  - 1.3 g/dL   Gamma Glob SerPl Elph-Mcnc 0.6 0.4 - 1.8 g/dL   M Protein SerPl Elph-Mcnc 0.1 (H) Not Observed g/dL   Globulin, Total 2.8 2.2 - 3.9 g/dL   Albumin/Glob SerPl 1.4 0.7 - 1.7   IFE 1 Comment (A)    Please Note Comment   CMP (Cancer Center only)  Result Value Ref Range   Sodium 137 135 - 145 mmol/L   Potassium 4.1 3.5 - 5.1 mmol/L  Chloride 103 98 - 111 mmol/L   CO2 22 22 - 32 mmol/L   Glucose, Bld 241 (H) 70 - 99 mg/dL   BUN 18 8 - 23 mg/dL   Creatinine 9.62 (H) 9.52 - 1.00 mg/dL   Calcium 8.6 (L) 8.9 - 10.3 mg/dL   Total Protein 7.3 6.5 - 8.1 g/dL   Albumin 4.0 3.5 - 5.0 g/dL   AST 36 15 - 41 U/L   ALT 21 0 - 44 U/L   Alkaline Phosphatase 66 38 - 126 U/L   Total Bilirubin 1.0 <1.2 mg/dL   GFR, Estimated 38 (L) >60 mL/min   Anion gap 12 5 - 15  CBC with Differential (Cancer Center Only)  Result Value Ref Range   WBC Count 6.9 4.0 - 10.5 K/uL   RBC 4.82 3.87 - 5.11 MIL/uL   Hemoglobin 14.9 12.0 - 15.0 g/dL   HCT 84.1 (H) 32.4 - 40.1 %   MCV 95.6 80.0 - 100.0 fL   MCH 30.9 26.0 - 34.0 pg   MCHC 32.3 30.0 - 36.0 g/dL   RDW 02.7 25.3 - 66.4 %   Platelet Count 174 150 - 400 K/uL   nRBC 0.0 0.0 - 0.2 %   Neutrophils Relative % 66 %   Neutro Abs 4.4 1.7 - 7.7 K/uL   Lymphocytes Relative 26 %   Lymphs Abs 1.8 0.7 - 4.0 K/uL   Monocytes Relative 7 %   Monocytes Absolute 0.5 0.1 - 1.0 K/uL   Eosinophils Relative 1 %   Eosinophils Absolute 0.1 0.0 - 0.5 K/uL   Basophils Relative 0 %   Basophils Absolute 0.0 0.0 - 0.1 K/uL   Immature Granulocytes 0 %   Abs Immature Granulocytes 0.02 0.00 - 0.07 K/uL      Assessment & Plan:   Problem List Items Addressed This Visit       Cardiovascular and Mediastinum   Hypertension associated with diabetes (HCC)    Chronic.  Under good control on current regimen. Continue current regimen. Continue to monitor. Call with any concerns. Refills up to date. Labs drawn today.       Relevant Medications   pregabalin (LYRICA) 150  MG capsule   Atrial fibrillation (HCC)    Chronic.  NSR in office today.          Endocrine   DM type 2 with diabetic peripheral neuropathy (HCC)    Chronic.  Controlled.  Last A1c was 7.2%.  Continue with [place].  If elevated > 8 can add Rybelsus to regimen.  Continue with current medication regimen of Lyrica TID for neuropathy.  Refills sent today.  Labs ordered today.  Return to clinic in 3 months for reevaluation.  Call sooner if concerns arise.        Relevant Medications   pregabalin (LYRICA) 150 MG capsule   Other Relevant Orders   HgB A1c   Hyperlipidemia associated with type 2 diabetes mellitus (HCC)    Chronic.  Continue atorvastatin weekly.  Recheck 3 months at follow up.      Relevant Medications   ondansetron (ZOFRAN) 4 MG tablet   Other Relevant Orders   Lipid Profile     Genitourinary   CKD (chronic kidney disease) stage 3, GFR 30-59 ml/min (HCC)    Rechecking labs today. Await results. Treat as needed.  Continue to follow up with Nephrology.  Continue with Marcelline Deist and ACE.      Relevant Orders   Comp Met (CMET)  Other   BMI 50.0-59.9, adult (HCC) - Primary    Recommended eating smaller high protein, low fat meals more frequently and exercising 30 mins a day 5 times a week with a goal of 10-15lb weight loss in the next 3 months.       Back pain    Under good control on current regimen. Continue current regimen. Continue to monitor. Call with any concerns. Refills given for 3 months. Follow up 3 months.       Relevant Medications   traMADol (ULTRAM) 50 MG tablet   Other Visit Diagnoses     Encounter for screening mammogram for malignant neoplasm of breast       Relevant Orders   MM 3D SCREENING MAMMOGRAM BILATERAL BREAST   Acute pain of left shoulder       Xray ordered.  Suspect some arthritis is causing a pinched nerve. Will await results and make recommendations based on results.   Relevant Orders   DG Shoulder Left   Need for influenza  vaccination       Relevant Orders   Flu Vaccine Trivalent High Dose (Fluad)         Follow up plan: Return in about 3 months (around 06/01/2023) for HTN, HLD, DM2 FU.

## 2023-03-01 NOTE — Assessment & Plan Note (Signed)
Rechecking labs today. Await results. Treat as needed.  Continue to follow up with Nephrology.  Continue with Marcelline Deist and ACE.

## 2023-03-01 NOTE — Assessment & Plan Note (Addendum)
Chronic.  Controlled.  Last A1c was 7.2%.  Continue with [place].  If elevated > 8 can add Rybelsus to regimen.  Continue with current medication regimen of Lyrica TID for neuropathy.  Refills sent today.  Labs ordered today.  Return to clinic in 3 months for reevaluation.  Call sooner if concerns arise.

## 2023-03-01 NOTE — Assessment & Plan Note (Signed)
Under good control on current regimen. Continue current regimen. Continue to monitor. Call with any concerns. Refills given for 3 months. Follow up 3 months.

## 2023-03-01 NOTE — Assessment & Plan Note (Signed)
Chronic.  Continue atorvastatin weekly.  Recheck 3 months at follow up.

## 2023-03-01 NOTE — Assessment & Plan Note (Signed)
Chronic.  NSR in office today.

## 2023-03-02 ENCOUNTER — Ambulatory Visit
Admission: RE | Admit: 2023-03-02 | Discharge: 2023-03-02 | Disposition: A | Discharge status: Home / Self Care | Payer: Medicare PPO | Attending: Nurse Practitioner | Admitting: Nurse Practitioner

## 2023-03-02 ENCOUNTER — Ambulatory Visit
Admission: RE | Admit: 2023-03-02 | Discharge: 2023-03-02 | Disposition: A | Discharge status: Home / Self Care | Payer: Medicare PPO | Source: Ambulatory Visit | Attending: Nurse Practitioner

## 2023-03-02 DIAGNOSIS — M25512 Pain in left shoulder: Secondary | ICD-10-CM | POA: Diagnosis not present

## 2023-03-02 DIAGNOSIS — M19012 Primary osteoarthritis, left shoulder: Secondary | ICD-10-CM | POA: Diagnosis not present

## 2023-03-02 LAB — LIPID PANEL
Chol/HDL Ratio: 3.1 {ratio} (ref 0.0–4.4)
Cholesterol, Total: 167 mg/dL (ref 100–199)
HDL: 54 mg/dL (ref >39)
LDL Chol Calc (NIH): 82 mg/dL (ref 0–99)
Triglycerides: 186 mg/dL — ABNORMAL HIGH (ref 0–149)
VLDL Cholesterol Cal: 31 mg/dL (ref 5–40)

## 2023-03-02 LAB — COMPREHENSIVE METABOLIC PANEL
ALT: 31 [IU]/L (ref 0–32)
AST: 46 [IU]/L — ABNORMAL HIGH (ref 0–40)
Albumin: 4.1 g/dL (ref 3.8–4.8)
Alkaline Phosphatase: 75 [IU]/L (ref 44–121)
BUN/Creatinine Ratio: 11 — ABNORMAL LOW (ref 12–28)
BUN: 14 mg/dL (ref 8–27)
Bilirubin Total: 0.6 mg/dL (ref 0.0–1.2)
CO2: 25 mmol/L (ref 20–29)
Calcium: 9.4 mg/dL (ref 8.7–10.3)
Chloride: 102 mmol/L (ref 96–106)
Creatinine, Ser: 1.31 mg/dL — ABNORMAL HIGH (ref 0.57–1.00)
Globulin, Total: 2.7 g/dL (ref 1.5–4.5)
Glucose: 142 mg/dL — ABNORMAL HIGH (ref 70–99)
Potassium: 4.4 mmol/L (ref 3.5–5.2)
Sodium: 142 mmol/L (ref 134–144)
Total Protein: 6.8 g/dL (ref 6.0–8.5)
eGFR: 43 mL/min/{1.73_m2} — ABNORMAL LOW (ref >59)

## 2023-03-02 LAB — HEMOGLOBIN A1C
Est. average glucose Bld gHb Est-mCnc: 203 mg/dL
Hgb A1c MFr Bld: 8.7 % — ABNORMAL HIGH (ref 4.8–5.6)

## 2023-03-02 MED ORDER — RYBELSUS 3 MG PO TABS: 3.0000 mg | ORAL_TABLET | Freq: Every day | ORAL | 0 refills | Status: DC

## 2023-03-02 MED ORDER — RYBELSUS 7 MG PO TABS: 7.0000 mg | ORAL_TABLET | Freq: Every day | ORAL | 0 refills | Status: DC

## 2023-03-02 NOTE — Addendum Note (Signed)
Addended by: Larae Grooms on: 03/02/2023 08:06 AM   Modules accepted: Orders

## 2023-03-03 NOTE — Addendum Note (Signed)
Addended by: Larae Grooms on: 03/03/2023 10:35 AM   Modules accepted: Orders

## 2023-03-10 DIAGNOSIS — M65222 Calcific tendinitis, left upper arm: Secondary | ICD-10-CM | POA: Diagnosis not present

## 2023-03-10 DIAGNOSIS — E119 Type 2 diabetes mellitus without complications: Secondary | ICD-10-CM | POA: Diagnosis not present

## 2023-03-21 ENCOUNTER — Other Ambulatory Visit: Payer: Self-pay | Admitting: Nurse Practitioner

## 2023-03-22 ENCOUNTER — Other Ambulatory Visit: Payer: Self-pay | Admitting: Nurse Practitioner

## 2023-03-23 NOTE — Telephone Encounter (Signed)
Requested medication (s) are due for refill today: Due 04/01/23  Requested medication (s) are on the active medication list: yes    Last refill: 03/02/23  #30  0 refills  Future visit scheduled yes  06/01/23  Notes to clinic:Off protocol, please review. Thank you.  Requested Prescriptions  Pending Prescriptions Disp Refills   RYBELSUS 3 MG TABS [Pharmacy Med Name: RYBELSUS 3 MG TABLET] 30 tablet 0    Sig: TAKE 1 TABLET BY MOUTH DAILY     Off-Protocol Failed - 03/23/2023  7:48 AM      Failed - Medication not assigned to a protocol, review manually.      Passed - Valid encounter within last 12 months    Recent Outpatient Visits           3 weeks ago BMI 50.0-59.9, adult [facility])   [place] [facility] Larae Grooms, NP   3 months ago Hypertension associated with diabetes [facility])   [place] [facility] [place], Megan P, DO   6 months ago Hypertension associated with diabetes [facility])   [place] [facility] Larae Grooms, NP   8 months ago Acute intractable headache, unspecified headache type   [place] [facility] Larae Grooms, NP   9 months ago Constipation, unspecified constipation type   [place] [facility] Larae Grooms, NP       Future Appointments             In 2 months Larae Grooms, NP [facility]

## 2023-03-23 NOTE — Telephone Encounter (Signed)
Requested Prescriptions  Pending Prescriptions Disp Refills   pantoprazole (PROTONIX) 40 MG tablet [Pharmacy Med Name: PANTOPRAZOLE SOD DR 40 MG TAB] 180 tablet 1    Sig: TAKE 1 TABLET (40 MG TOTAL) BY MOUTH TWICE A DAY BEFORE MEALS     Gastroenterology: Proton Pump Inhibitors Passed - 03/23/2023  1:29 PM      Passed - Valid encounter within last 12 months    Recent Outpatient Visits           3 weeks ago BMI 50.0-59.9, adult [facility])   [place] [facility] Larae Grooms, NP   3 months ago Hypertension associated with diabetes [facility])   [place] [facility] [place], Megan P, DO   6 months ago Hypertension associated with diabetes [facility])   [place] [facility] Larae Grooms, NP   8 months ago Acute intractable headache, unspecified headache type   [place] [facility] Larae Grooms, NP   9 months ago Constipation, unspecified constipation type   [place] [facility] Larae Grooms, NP       Future Appointments             In 2 months Larae Grooms, NP  [facility]

## 2023-03-24 DIAGNOSIS — M25512 Pain in left shoulder: Secondary | ICD-10-CM | POA: Diagnosis not present

## 2023-03-24 DIAGNOSIS — M542 Cervicalgia: Secondary | ICD-10-CM | POA: Diagnosis not present

## 2023-04-02 NOTE — Progress Notes (Unsigned)
[facility] [address] [place], [place] [address]  Pulmonary Sleep Medicine   Office Visit Note  Patient Name: Anna Juarez DOB: [birth year] MRN [id number]    Chief Complaint: Obstructive Sleep Apnea visit  Brief History:  Anna Juarez is seen today for a follow up visit for CPAP@ 13 cmH2O. The patient has a 13 year history of sleep apnea. Patient is using PAP nightly.  The patient feels rested after sleeping with PAP.  The patient reports benefiting from PAP use. Reported sleepiness is  improved and the Epworth Sleepiness Score is *** out of 24. The patient *** take naps. The patient complains of the following: ***  The compliance download shows 99% compliance with an average use time of 9 hours 58 minutes. The AHI is 1.8.  The patient *** of limb movements disrupting sleep.The patient continues to require PAP therapy in order to eliminate sleep apnea.   ROS  General: (-) fever, (-) chills, (-) night sweat Nose and Sinuses: (-) nasal stuffiness or itchiness, (-) postnasal drip, (-) nosebleeds, (-) sinus trouble. Mouth and Throat: (-) sore throat, (-) hoarseness. Neck: (-) swollen glands, (-) enlarged thyroid, (-) neck pain. Respiratory: *** cough, *** shortness of breath, *** wheezing. Neurologic: *** numbness, *** tingling. Psychiatric: *** anxiety, *** depression   Current Medication: Outpatient Encounter Medications as of 04/05/2023  Medication Sig   ACCU-CHEK GUIDE TEST test strip USE AS INSTRUCTED   acetaminophen (TYLENOL) 500 MG tablet Take 2 tablets (1,000 mg total) by mouth every 6 (six) hours as needed for mild pain.   apixaban (ELIQUIS) 5 MG TABS tablet Take 1 tablet (5 mg total) by mouth 2 (two) times daily.   atorvastatin (LIPITOR) 40 MG tablet Take 1 tablet (40 mg total) by mouth once a week.   benazepril (LOTENSIN) 20 MG tablet Take 1 tablet (20 mg total) by mouth daily.   dapagliflozin propanediol (FARXIGA) 10 MG TABS tablet TAKE 1 TABLET BY MOUTH EVERY  DAY BEFORE BREAKFAST   linaclotide (LINZESS) 145 MCG CAPS capsule Take 1 capsule (145 mcg total) by mouth daily before breakfast.   metoprolol succinate (TOPROL-XL) 50 MG 24 hr tablet Take 1 tablet (50 mg total) by mouth daily. TAKE WITH OR IMMEDIATELY FOLLOWING A MEAL in AM and take 25 mg ( 0.5 tablet) in the PM   nystatin (MYCOSTATIN/NYSTOP) powder Apply 1 Application topically 3 (three) times daily.   ondansetron (ZOFRAN) 4 MG tablet Take 1 tablet (4 mg total) by mouth every 6 (six) hours.   pantoprazole (PROTONIX) 40 MG tablet TAKE 1 TABLET (40 MG TOTAL) BY MOUTH TWICE A DAY BEFORE MEALS   pregabalin (LYRICA) 150 MG capsule Take 1 capsule (150 mg total) by mouth in the morning, at noon, and at bedtime.   Semaglutide (RYBELSUS) 3 MG TABS Take 1 tablet (3 mg total) by mouth daily.   Semaglutide (RYBELSUS) 7 MG TABS Take 1 tablet (7 mg total) by mouth daily.   SODIUM FLUORIDE 5000 PPM 1.1 % PSTE See admin instructions.   traMADol (ULTRAM) 50 MG tablet Take 1 tablet (50 mg total) by mouth every 8 (eight) hours as needed.   No facility-administered encounter medications on file as of 04/05/2023.    Surgical History: Past Surgical History:  Procedure Laterality Date   ABDOMINAL HYSTERECTOMY     CARDIAC CATHETERIZATION     [facility]   CARDIOVERSION N/A 12/09/2022   Procedure: CARDIOVERSION;  Surgeon: Antonieta Iba, MD;  Location: [facility] ORS;  Service: Cardiovascular;  Laterality:  N/A;   CATARACT EXTRACTION W/PHACO Right 06/02/2021   Procedure: CATARACT EXTRACTION PHACO AND INTRAOCULAR LENS PLACEMENT (IOC) RIGHT DIABETIC;  Surgeon: Nevada Crane, MD;  Location: [facility];  Service: Ophthalmology;  Laterality: Right;  2.52 0:22.8   CATARACT EXTRACTION W/PHACO Left 06/16/2021   Procedure: CATARACT EXTRACTION PHACO AND INTRAOCULAR LENS PLACEMENT (IOC) LEFT DIABETIC 3.51 00:30.9;  Surgeon: Nevada Crane, MD;  Location: [facility];  Service: Ophthalmology;  Laterality: Left;    CERVICAL FUSION     x2   CHOLECYSTECTOMY     COLONOSCOPY     COLONOSCOPY WITH PROPOFOL N/A 09/09/2021   Procedure: COLONOSCOPY WITH PROPOFOL;  Surgeon: [name], MD;  Location: [facility] ENDOSCOPY;  Service: Endoscopy;  Laterality: N/A;   ESOPHAGOGASTRODUODENOSCOPY (EGD) WITH PROPOFOL N/A 09/09/2021   Procedure: ESOPHAGOGASTRODUODENOSCOPY (EGD) WITH PROPOFOL;  Surgeon: [name], MD;  Location: [facility] ENDOSCOPY;  Service: Endoscopy;  Laterality: N/A;   ESOPHAGOGASTRODUODENOSCOPY (EGD) WITH PROPOFOL N/A 09/15/2022   Procedure: ESOPHAGOGASTRODUODENOSCOPY (EGD) WITH PROPOFOL;  Surgeon: [name], MD;  Location: [facility] ENDOSCOPY;  Service: Endoscopy;  Laterality: N/A;   JOINT REPLACEMENT Bilateral    knees    Medical History: Past Medical History:  Diagnosis Date   Arthritis    Chronic kidney disease    Complication of anesthesia    Diabetes mellitus without complication (HCC)    Extreme obesity    Fatty liver    GERD (gastroesophageal reflux disease)    Hyperlipidemia    Hypertension    Palpitations    PONV (postoperative nausea and vomiting)    Radiculopathy, cervical    Renal insufficiency    stage 3   Sleep apnea    uses cpap   Tear of acetabular labrum 01/2021   right    Family History: Non contributory to the present illness  Social History: Social History   Socioeconomic History   Marital status: Widowed    Spouse name: Not on file   Number of children: Not on file   Years of education: Not on file   Highest education level: 12th grade  Occupational History   Occupation: retired   Tobacco Use   Smoking status: Never    Passive exposure: Never   Smokeless tobacco: Never  Vaping Use   Vaping status: Never Used  Substance and Sexual Activity   Alcohol use: No   Drug use: No   Sexual activity: Not Currently  Other Topics Concern   Not on file  Social History Narrative   Live in [place]; lives alone; never smoker; no alcohol; retitred.    Social Drivers of Corporate investment banker Strain: Low Risk  (10/05/2022)   Overall Financial Resource Strain (CARDIA)    Difficulty of Paying Living Expenses: Not hard at all  Food Insecurity: No Food Insecurity (10/05/2022)   Hunger Vital Sign    Worried About Running Out of Food in the Last Year: Never true    Ran Out of Food in the Last Year: Never true  Transportation Needs: No Transportation Needs (10/05/2022)   PRAPARE - Administrator, Civil Service (Medical): No    Lack of Transportation (Non-Medical): No  Physical Activity: Inactive (10/05/2022)   Exercise Vital Sign    Days of Exercise per Week: 0 days    Minutes of Exercise per Session: 0 min  Stress: No Stress Concern Present (10/05/2022)   [employer] of Occupational Health - Occupational Stress Questionnaire    Feeling of Stress : Not  at all  Social Connections: Moderately Isolated (10/05/2022)   Social Connection and Isolation Panel [NHANES]    Frequency of Communication with Friends and Family: More than three times a week    Frequency of Social Gatherings with Friends and Family: Once a week    Attends Religious Services: More than 4 times per year    Active Member of [employer] or Organizations: No    Attends Banker Meetings: Never    Marital Status: Widowed  Intimate Partner Violence: Not At Risk (10/05/2022)   Humiliation, Afraid, Rape, and Kick questionnaire    Fear of Current or Ex-Partner: No    Emotionally Abused: No    Physically Abused: No    Sexually Abused: No    Vital Signs: There were no vitals taken for this visit. There is no height or weight on file to calculate BMI.    Examination: General Appearance: The patient is well-developed, well-nourished, and in no distress. Neck Circumference: 38 cm Skin: Gross inspection of skin unremarkable. Head: normocephalic, no gross deformities. Eyes: no gross deformities noted. ENT: ears appear grossly normal Neurologic: Alert and  oriented. No involuntary movements.  STOP BANG RISK ASSESSMENT S (snore) Have you been told that you snore?     NO   T (tired) Are you often tired, fatigued, or sleepy during the day?   NO  O (obstruction) Do you stop breathing, choke, or gasp during sleep? NO   P (pressure) Do you have or are you being treated for high blood pressure? YES   B (BMI) Is your body index greater than 35 kg/m? YES   A (age) Are you 25 years old or older? YES   N (neck) Do you have a neck circumference greater than 16 inches?   NO   G (gender) Are you a female? NO   TOTAL STOP/BANG "YES" ANSWERS 3       A STOP-Bang score of 2 or less is considered low risk, and a score of 5 or more is high risk for having either moderate or severe OSA. For people who score 3 or 4, doctors may need to perform further assessment to determine how likely they are to have OSA.         EPWORTH SLEEPINESS SCALE:  Scale:  (0)= no chance of dozing; (1)= slight chance of dozing; (2)= moderate chance of dozing; (3)= high chance of dozing  Chance  Situtation    Sitting and reading: ***    Watching TV: ***    Sitting Inactive in public: ***    As a passenger in car: ***      Lying down to rest: ***    Sitting and talking: ***    Sitting quielty after lunch: ***    In a car, stopped in traffic: ***   TOTAL SCORE:   *** out of 24    SLEEP STUDIES:  PSG (07/2022) AHI 3.3/hr, min SpO2 85%, repeat study. PSG (09/2022) AHI 6.5/hr, REM AHI 18/hr, min SpO2 77% Titration (10/2022) CPAP@ 8 cmH2O   CPAP COMPLIANCE DATA:  Date Range: 01/03/2023-04/02/2023   Average Daily Use: 9 hours 58 minutes  Median Use: 9 hours 53 minutes  Compliance for > 4 Hours: 99%  AHI: 1.8 respiratory events per hour  Days Used: 89/90 days  Mask Leak: 1.8  95th Percentile Pressure: 13         LABS: Recent Results (from the past 2160 hours)  Kappa/lambda light chains     Status:  Abnormal   Collection Time: 02/15/23   1:01 PM  Result Value Ref Range   Kappa free light chain 21.5 (H) 3.3 - 19.4 mg/L   Lambda free light chains 204.6 (H) 5.7 - 26.3 mg/L   Kappa, lambda light chain ratio 0.11 (L) 0.26 - 1.65    Comment: (NOTE) Performed At: [facility]  [address] [place], [place] [id number] Jolene Schimke MD [id number]   Multiple Myeloma Panel (SPEP&IFE w/QIG)     Status: Abnormal   Collection Time: 02/15/23  1:01 PM  Result Value Ref Range   IgG (Immunoglobin G), Serum 791 586 - 1,602 mg/dL   IgA 841 64 - 324 mg/dL   IgM (Immunoglobulin M), Srm 53 26 - 217 mg/dL   Total Protein ELP 6.5 6.0 - 8.5 g/dL   Albumin SerPl Elph-Mcnc 3.7 2.9 - 4.4 g/dL   Alpha 1 0.2 0.0 - 0.4 g/dL   Alpha2 Glob SerPl Elph-Mcnc 0.8 0.4 - 1.0 g/dL   B-Globulin SerPl Elph-Mcnc 1.2 0.7 - 1.3 g/dL   Gamma Glob SerPl Elph-Mcnc 0.6 0.4 - 1.8 g/dL   M Protein SerPl Elph-Mcnc 0.1 (H) Not Observed g/dL   Globulin, Total 2.8 2.2 - 3.9 g/dL   Albumin/Glob SerPl 1.4 0.7 - 1.7   IFE 1 Comment (A)     Comment: (NOTE) 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    Please Note Comment     Comment: (NOTE) Protein electrophoresis scan will follow via computer, mail, or courier delivery. Performed At: [facility] [address] [place], [place] [id number] Jolene Schimke MD [id number]   CMP (Cancer Center only)     Status: Abnormal   Collection Time: 02/15/23  1:01 PM  Result Value Ref Range   Sodium 137 135 - 145 mmol/L   Potassium 4.1 3.5 - 5.1 mmol/L   Chloride 103 98 - 111 mmol/L   CO2 22 22 - 32 mmol/L   Glucose, Bld 241 (H) 70 - 99 mg/dL    Comment: Glucose reference range applies only to samples taken after fasting for at least 8 hours.   BUN 18 8 - 23 mg/dL   Creatinine 5.63 (H) 8.75 - 1.00 mg/dL   Calcium 8.6 (L) 8.9 - 10.3 mg/dL   Total Protein 7.3 6.5 - 8.1 g/dL   Albumin 4.0 3.5 - 5.0 g/dL   AST 36 15 - 41 U/L   ALT 21 0 - 44 U/L   Alkaline  Phosphatase 66 38 - 126 U/L   Total Bilirubin 1.0 <1.2 mg/dL   GFR, Estimated 38 (L) >60 mL/min    Comment: (NOTE) Calculated using the CKD-EPI Creatinine Equation (2021)    Anion gap 12 5 - 15    Comment: Performed at [facility], [address] [place], [place] [address]  CBC with Differential (Cancer Center Only)     Status: Abnormal   Collection Time: 02/15/23  1:01 PM  Result Value Ref Range   WBC Count 6.9 4.0 - 10.5 K/uL   RBC 4.82 3.87 - 5.11 MIL/uL   Hemoglobin 14.9 12.0 - 15.0 g/dL   HCT 95.1 (H) 88.4 - 16.6 %   MCV 95.6 80.0 - 100.0 fL   MCH 30.9 26.0 - 34.0 pg   MCHC 32.3 30.0 - 36.0 g/dL   RDW 06.3 01.6 - 01.0 %   Platelet Count 174 150 - 400 K/uL   nRBC 0.0 0.0 - 0.2 %   Neutrophils Relative % 66 %  Neutro Abs 4.4 1.7 - 7.7 K/uL   Lymphocytes Relative 26 %   Lymphs Abs 1.8 0.7 - 4.0 K/uL   Monocytes Relative 7 %   Monocytes Absolute 0.5 0.1 - 1.0 K/uL   Eosinophils Relative 1 %   Eosinophils Absolute 0.1 0.0 - 0.5 K/uL   Basophils Relative 0 %   Basophils Absolute 0.0 0.0 - 0.1 K/uL   Immature Granulocytes 0 %   Abs Immature Granulocytes 0.02 0.00 - 0.07 K/uL    Comment: Performed at [facility], [address] [place], [place] [address]  Comp Met (CMET)     Status: Abnormal   Collection Time: 03/01/23  2:49 PM  Result Value Ref Range   Glucose 142 (H) 70 - 99 mg/dL   BUN 14 8 - 27 mg/dL   Creatinine, Ser 6.04 (H) 0.57 - 1.00 mg/dL   eGFR 43 (L) >54 UJ/WJX/9.14   BUN/Creatinine Ratio 11 (L) 12 - 28   Sodium 142 134 - 144 mmol/L   Potassium 4.4 3.5 - 5.2 mmol/L   Chloride 102 96 - 106 mmol/L   CO2 25 20 - 29 mmol/L   Calcium 9.4 8.7 - 10.3 mg/dL   Total Protein 6.8 6.0 - 8.5 g/dL   Albumin 4.1 3.8 - 4.8 g/dL   Globulin, Total 2.7 1.5 - 4.5 g/dL   Bilirubin Total 0.6 0.0 - 1.2 mg/dL   Alkaline Phosphatase 75 44 - 121 IU/L   AST 46 (H) 0 - 40 IU/L   ALT 31 0 - 32 IU/L  Lipid Profile     Status: Abnormal   Collection Time: 03/01/23   2:49 PM  Result Value Ref Range   Cholesterol, Total 167 100 - 199 mg/dL   Triglycerides 782 (H) 0 - 149 mg/dL   HDL 54 >95 mg/dL   VLDL Cholesterol Cal 31 5 - 40 mg/dL   LDL Chol Calc (NIH) 82 0 - 99 mg/dL   Chol/HDL Ratio 3.1 0.0 - 4.4 ratio    Comment:                                   T. Chol/HDL Ratio                                             Men  Women                               1/2 Avg.Risk  3.4    3.3                                   Avg.Risk  5.0    4.4                                2X Avg.Risk  9.6    7.1                                3X Avg.Risk 23.4   11.0   HgB A1c     Status: Abnormal   Collection Time:  03/01/23  2:49 PM  Result Value Ref Range   Hgb A1c MFr Bld 8.7 (H) 4.8 - 5.6 %    Comment:          Prediabetes: 5.7 - 6.4          Diabetes: >6.4          Glycemic control for adults with diabetes: <7.0    Est. average glucose Bld gHb Est-mCnc 203 mg/dL    Radiology: DG Shoulder Left Result Date: 03/02/2023 CLINICAL DATA:  Left shoulder pain EXAM: LEFT SHOULDER - 2+ VIEW COMPARISON:  None Available. FINDINGS: Glenohumeral joint is in abduction on the [place] view. No evidence of fracture or dislocation. Mild AC joint osteoarthritis. There are 2 adjacent ovoid areas of mineralization adjacent to the greater tuberosity measuring up to 7 mm in size compatible with hydroxyapatite deposition. IMPRESSION: 1. No acute osseous abnormality of the left shoulder. 2. Mild AC joint osteoarthritis. 3. Calcific tendinopathy of the rotator cuff. Electronically Signed   By: Duanne Guess D.O.   On: 03/02/2023 12:57    No results found.  No results found.    Assessment and Plan: Patient Active Problem List   Diagnosis Date Noted   SOB (shortness of breath) 12/09/2022   Atrial fibrillation (HCC) 09/21/2022   Abdominal pain, generalized 09/15/2022   Acute intractable headache 07/23/2022   OSA (obstructive sleep apnea) 06/15/2022   CPAP use counseling 06/15/2022    Morbid obesity (HCC) 06/15/2022   Sleep apnea    Generalized abdominal pain    Polyp of ascending colon    Gastritis without bleeding    BRBPR (bright red blood per rectum) 06/10/2021   Constipation 06/10/2021   Symptomatic cholelithiasis    Monoclonal gammopathy 02/20/2020   MGUS (monoclonal gammopathy of unknown significance) 02/08/2018   CKD (chronic kidney disease) stage 3, GFR 30-59 ml/min (HCC) 01/04/2018   Advanced care planning/counseling discussion 11/24/2016   Back pain 07/22/2016   BMI 50.0-59.9, adult (HCC) 10/02/2014   DM type 2 with diabetic peripheral neuropathy (HCC)    Hyperlipidemia associated with type 2 diabetes mellitus (HCC)    Hypertension associated with diabetes (HCC)       The patient *** tolerate PAP and reports *** benefit from PAP use. The patient was reminded how to *** and advised to ***. The patient was also counselled on ***. The compliance is ***. The AHI is ***.   ***  General Counseling: I have discussed the findings of the evaluation and examination with Anna Juarez.  I have also discussed any further diagnostic evaluation thatmay be needed or ordered today. Anna Juarez verbalizes understanding of the findings of todays visit. We also reviewed her medications today and discussed drug interactions and side effects including but not limited excessive drowsiness and altered mental states. We also discussed that there is always a risk not just to her but also people around her. she has been encouraged to call the office with any questions or concerns that should arise related to todays visit.  No orders of the defined types were placed in this encounter.       I have personally obtained a history, examined the patient, evaluated laboratory and imaging results, formulated the assessment and plan and placed orders.  Yevonne Pax, MD [facility] Diplomate ABMS Pulmonary Critical Care Medicine and Sleep Medicine

## 2023-04-05 ENCOUNTER — Ambulatory Visit (INDEPENDENT_AMBULATORY_CARE_PROVIDER_SITE_OTHER): Payer: Medicare PPO | Admitting: Internal Medicine

## 2023-04-05 VITALS — BP 116/84 | HR 85 | Resp 16 | Ht 62.0 in | Wt 281.0 lb

## 2023-04-05 DIAGNOSIS — G4733 Obstructive sleep apnea (adult) (pediatric): Secondary | ICD-10-CM | POA: Diagnosis not present

## 2023-04-05 DIAGNOSIS — Z7189 Other specified counseling: Secondary | ICD-10-CM

## 2023-04-05 DIAGNOSIS — E1122 Type 2 diabetes mellitus with diabetic chronic kidney disease: Secondary | ICD-10-CM | POA: Diagnosis not present

## 2023-04-05 DIAGNOSIS — N1832 Chronic kidney disease, stage 3b: Secondary | ICD-10-CM | POA: Diagnosis not present

## 2023-04-05 NOTE — Patient Instructions (Signed)

## 2023-04-07 ENCOUNTER — Other Ambulatory Visit: Payer: Self-pay | Admitting: Nurse Practitioner

## 2023-04-07 DIAGNOSIS — I10 Essential (primary) hypertension: Secondary | ICD-10-CM

## 2023-04-10 NOTE — Telephone Encounter (Signed)
 Dosage change on 11/30/22 to 20 mg and it was sent to the pharmacy, will refuse this request.   Requested Prescriptions  Pending Prescriptions Disp Refills   benazepril  (LOTENSIN ) 40 MG tablet [Pharmacy Med Name: BENAZEPRIL  HCL 40 MG TABLET] 90 tablet 1    Sig: TAKE 1 TABLET BY MOUTH EVERY DAY     Cardiovascular:  ACE Inhibitors Failed - 04/10/2023 12:51 PM      Failed - Cr in normal range and within 180 days    Creatinine  Date Value Ref Range Status  02/15/2023 1.44 (H) 0.44 - 1.00 mg/dL Final   Creatinine, Ser  Date Value Ref Range Status  03/01/2023 1.31 (H) 0.57 - 1.00 mg/dL Final         Passed - K in normal range and within 180 days    Potassium  Date Value Ref Range Status  03/01/2023 4.4 3.5 - 5.2 mmol/L Final         Passed - Patient is not pregnant      Passed - Last BP in normal range    BP Readings from Last 1 Encounters:  04/05/23 116/84         Passed - Valid encounter within last 6 months    Recent Outpatient Visits           1 month ago BMI 50.0-59.9, adult [facility])   [place] [facility] Melvin Pao, NP   4 months ago Hypertension associated with diabetes [facility])   [place] [facility], Megan P, DO   7 months ago Hypertension associated with diabetes [facility])   [place] [facility] Melvin Pao, NP   8 months ago Acute intractable headache, unspecified headache type   [place] [facility] Melvin Pao, NP   9 months ago Constipation, unspecified constipation type   [place] Melvin Pao, NP       Future Appointments             In 1 month Melvin Pao, NP [place] [facility]

## 2023-04-12 DIAGNOSIS — I10 Essential (primary) hypertension: Secondary | ICD-10-CM | POA: Diagnosis not present

## 2023-04-12 DIAGNOSIS — R600 Localized edema: Secondary | ICD-10-CM | POA: Diagnosis not present

## 2023-04-12 DIAGNOSIS — N1832 Chronic kidney disease, stage 3b: Secondary | ICD-10-CM | POA: Diagnosis not present

## 2023-04-12 DIAGNOSIS — E1122 Type 2 diabetes mellitus with diabetic chronic kidney disease: Secondary | ICD-10-CM | POA: Diagnosis not present

## 2023-04-13 DIAGNOSIS — M542 Cervicalgia: Secondary | ICD-10-CM | POA: Diagnosis not present

## 2023-04-13 DIAGNOSIS — M25512 Pain in left shoulder: Secondary | ICD-10-CM | POA: Diagnosis not present

## 2023-04-19 DIAGNOSIS — M25512 Pain in left shoulder: Secondary | ICD-10-CM | POA: Diagnosis not present

## 2023-04-19 DIAGNOSIS — M542 Cervicalgia: Secondary | ICD-10-CM | POA: Diagnosis not present

## 2023-04-21 DIAGNOSIS — M25512 Pain in left shoulder: Secondary | ICD-10-CM | POA: Diagnosis not present

## 2023-04-21 DIAGNOSIS — M542 Cervicalgia: Secondary | ICD-10-CM | POA: Diagnosis not present

## 2023-04-24 DIAGNOSIS — G4733 Obstructive sleep apnea (adult) (pediatric): Secondary | ICD-10-CM | POA: Diagnosis not present

## 2023-05-10 DIAGNOSIS — E669 Obesity, unspecified: Secondary | ICD-10-CM | POA: Diagnosis not present

## 2023-05-10 DIAGNOSIS — E785 Hyperlipidemia, unspecified: Secondary | ICD-10-CM | POA: Diagnosis not present

## 2023-05-10 DIAGNOSIS — R112 Nausea with vomiting, unspecified: Secondary | ICD-10-CM | POA: Diagnosis not present

## 2023-05-10 DIAGNOSIS — I10 Essential (primary) hypertension: Secondary | ICD-10-CM | POA: Diagnosis not present

## 2023-05-10 DIAGNOSIS — R059 Cough, unspecified: Secondary | ICD-10-CM | POA: Diagnosis not present

## 2023-05-10 DIAGNOSIS — R531 Weakness: Secondary | ICD-10-CM | POA: Diagnosis not present

## 2023-05-10 DIAGNOSIS — I4891 Unspecified atrial fibrillation: Secondary | ICD-10-CM | POA: Diagnosis not present

## 2023-05-10 DIAGNOSIS — E119 Type 2 diabetes mellitus without complications: Secondary | ICD-10-CM | POA: Diagnosis not present

## 2023-05-10 DIAGNOSIS — K219 Gastro-esophageal reflux disease without esophagitis: Secondary | ICD-10-CM | POA: Diagnosis not present

## 2023-05-10 DIAGNOSIS — J101 Influenza due to other identified influenza virus with other respiratory manifestations: Secondary | ICD-10-CM | POA: Diagnosis not present

## 2023-05-10 DIAGNOSIS — G4733 Obstructive sleep apnea (adult) (pediatric): Secondary | ICD-10-CM | POA: Diagnosis not present

## 2023-05-11 DIAGNOSIS — R6889 Other general symptoms and signs: Secondary | ICD-10-CM | POA: Diagnosis not present

## 2023-05-11 DIAGNOSIS — R21 Rash and other nonspecific skin eruption: Secondary | ICD-10-CM | POA: Diagnosis not present

## 2023-05-11 DIAGNOSIS — E119 Type 2 diabetes mellitus without complications: Secondary | ICD-10-CM | POA: Diagnosis not present

## 2023-05-11 DIAGNOSIS — J09X1 Influenza due to identified novel influenza A virus with pneumonia: Secondary | ICD-10-CM | POA: Diagnosis not present

## 2023-05-11 DIAGNOSIS — R110 Nausea: Secondary | ICD-10-CM | POA: Diagnosis not present

## 2023-05-11 DIAGNOSIS — R062 Wheezing: Secondary | ICD-10-CM | POA: Diagnosis not present

## 2023-05-11 DIAGNOSIS — Z7409 Other reduced mobility: Secondary | ICD-10-CM | POA: Diagnosis not present

## 2023-05-11 DIAGNOSIS — R531 Weakness: Secondary | ICD-10-CM | POA: Diagnosis not present

## 2023-05-11 DIAGNOSIS — R279 Unspecified lack of coordination: Secondary | ICD-10-CM | POA: Diagnosis not present

## 2023-05-11 DIAGNOSIS — M79673 Pain in unspecified foot: Secondary | ICD-10-CM | POA: Diagnosis not present

## 2023-05-11 DIAGNOSIS — R5381 Other malaise: Secondary | ICD-10-CM | POA: Diagnosis not present

## 2023-05-11 DIAGNOSIS — J09X2 Influenza due to identified novel influenza A virus with other respiratory manifestations: Secondary | ICD-10-CM | POA: Diagnosis not present

## 2023-05-11 DIAGNOSIS — E669 Obesity, unspecified: Secondary | ICD-10-CM | POA: Diagnosis not present

## 2023-05-11 DIAGNOSIS — G4730 Sleep apnea, unspecified: Secondary | ICD-10-CM | POA: Diagnosis not present

## 2023-05-11 DIAGNOSIS — R059 Cough, unspecified: Secondary | ICD-10-CM | POA: Diagnosis not present

## 2023-05-11 DIAGNOSIS — A499 Bacterial infection, unspecified: Secondary | ICD-10-CM | POA: Diagnosis not present

## 2023-05-11 DIAGNOSIS — R52 Pain, unspecified: Secondary | ICD-10-CM | POA: Diagnosis not present

## 2023-05-11 DIAGNOSIS — M6281 Muscle weakness (generalized): Secondary | ICD-10-CM | POA: Diagnosis not present

## 2023-05-12 DIAGNOSIS — R062 Wheezing: Secondary | ICD-10-CM | POA: Diagnosis not present

## 2023-05-12 DIAGNOSIS — M79673 Pain in unspecified foot: Secondary | ICD-10-CM | POA: Diagnosis not present

## 2023-05-12 DIAGNOSIS — J09X1 Influenza due to identified novel influenza A virus with pneumonia: Secondary | ICD-10-CM | POA: Diagnosis not present

## 2023-05-12 DIAGNOSIS — R110 Nausea: Secondary | ICD-10-CM | POA: Diagnosis not present

## 2023-05-17 DIAGNOSIS — G4730 Sleep apnea, unspecified: Secondary | ICD-10-CM | POA: Diagnosis not present

## 2023-05-17 DIAGNOSIS — Z7409 Other reduced mobility: Secondary | ICD-10-CM | POA: Diagnosis not present

## 2023-05-17 DIAGNOSIS — E669 Obesity, unspecified: Secondary | ICD-10-CM | POA: Diagnosis not present

## 2023-05-17 DIAGNOSIS — R52 Pain, unspecified: Secondary | ICD-10-CM | POA: Diagnosis not present

## 2023-05-17 DIAGNOSIS — J09X1 Influenza due to identified novel influenza A virus with pneumonia: Secondary | ICD-10-CM | POA: Diagnosis not present

## 2023-05-17 DIAGNOSIS — R531 Weakness: Secondary | ICD-10-CM | POA: Diagnosis not present

## 2023-05-19 ENCOUNTER — Other Ambulatory Visit: Payer: Self-pay | Admitting: Nurse Practitioner

## 2023-05-19 DIAGNOSIS — E119 Type 2 diabetes mellitus without complications: Secondary | ICD-10-CM

## 2023-05-19 DIAGNOSIS — R531 Weakness: Secondary | ICD-10-CM | POA: Diagnosis not present

## 2023-05-19 DIAGNOSIS — R6889 Other general symptoms and signs: Secondary | ICD-10-CM | POA: Diagnosis not present

## 2023-05-19 DIAGNOSIS — R059 Cough, unspecified: Secondary | ICD-10-CM | POA: Diagnosis not present

## 2023-05-19 DIAGNOSIS — Z7409 Other reduced mobility: Secondary | ICD-10-CM | POA: Diagnosis not present

## 2023-05-19 DIAGNOSIS — J09X1 Influenza due to identified novel influenza A virus with pneumonia: Secondary | ICD-10-CM | POA: Diagnosis not present

## 2023-05-19 NOTE — Telephone Encounter (Signed)
Requested medication (s) are due for refill today: Yes  Requested medication (s) are on the active medication list: Yes  Last refill:  02/19/23  Future visit scheduled: Yes  Notes to clinic:  Unable to refill due to no refill protocol for this medication.      Requested Prescriptions  Pending Prescriptions Disp Refills   ACCU-CHEK GUIDE TEST test strip [Pharmacy Med Name: ACCU-CHEK GUIDE TEST STRIP] 100 strip 0    Sig: USE AS INSTRUCTED     There is no refill protocol information for this order

## 2023-05-21 DIAGNOSIS — R531 Weakness: Secondary | ICD-10-CM | POA: Diagnosis not present

## 2023-05-21 DIAGNOSIS — R21 Rash and other nonspecific skin eruption: Secondary | ICD-10-CM | POA: Diagnosis not present

## 2023-05-21 DIAGNOSIS — R059 Cough, unspecified: Secondary | ICD-10-CM | POA: Diagnosis not present

## 2023-05-21 DIAGNOSIS — J09X1 Influenza due to identified novel influenza A virus with pneumonia: Secondary | ICD-10-CM | POA: Diagnosis not present

## 2023-05-22 ENCOUNTER — Other Ambulatory Visit: Payer: Self-pay | Admitting: Nurse Practitioner

## 2023-05-22 DIAGNOSIS — I10 Essential (primary) hypertension: Secondary | ICD-10-CM

## 2023-05-24 NOTE — Telephone Encounter (Signed)
Requested Prescriptions  Refused Prescriptions Disp Refills   metoprolol succinate (TOPROL-XL) 50 MG 24 hr tablet [Pharmacy Med Name: METOPROLOL SUCC ER 50 MG TAB] 90 tablet 1    Sig: TAKE 1 TABLET BY MOUTH EVERY DAY WITH OR IMMEDIATELY FOLLOWING A MEAL     Cardiovascular:  Beta Blockers Passed - 05/24/2023 11:40 AM      Passed - Last BP in normal range    BP Readings from Last 1 Encounters:  04/05/23 116/84         Passed - Last Heart Rate in normal range    Pulse Readings from Last 1 Encounters:  04/05/23 85         Passed - Valid encounter within last 6 months    Recent Outpatient Visits           2 months ago BMI 50.0-59.9, adult [facility])   [place] [facility] Larae Grooms, NP   5 months ago Hypertension associated with diabetes [facility])   [place] [facility], Megan P, DO   9 months ago Hypertension associated with diabetes [facility])   [place] [facility] Larae Grooms, NP   10 months ago Acute intractable headache, unspecified headache type   [place] [facility] Larae Grooms, NP   11 months ago Constipation, unspecified constipation type   [place] [facility] Larae Grooms, NP       Future Appointments             In 1 week Larae Grooms, NP [place] [facility]

## 2023-05-25 DIAGNOSIS — G4733 Obstructive sleep apnea (adult) (pediatric): Secondary | ICD-10-CM | POA: Diagnosis not present

## 2023-06-01 ENCOUNTER — Ambulatory Visit: Payer: Medicare PPO | Admitting: Nurse Practitioner

## 2023-06-01 ENCOUNTER — Encounter: Payer: Self-pay | Admitting: Nurse Practitioner

## 2023-06-01 VITALS — BP 106/70 | HR 79 | Ht 63.0 in | Wt 291.0 lb

## 2023-06-01 DIAGNOSIS — N1832 Chronic kidney disease, stage 3b: Secondary | ICD-10-CM

## 2023-06-01 DIAGNOSIS — E119 Type 2 diabetes mellitus without complications: Secondary | ICD-10-CM | POA: Diagnosis not present

## 2023-06-01 DIAGNOSIS — E1159 Type 2 diabetes mellitus with other circulatory complications: Secondary | ICD-10-CM

## 2023-06-01 DIAGNOSIS — E1142 Type 2 diabetes mellitus with diabetic polyneuropathy: Secondary | ICD-10-CM

## 2023-06-01 DIAGNOSIS — I152 Hypertension secondary to endocrine disorders: Secondary | ICD-10-CM

## 2023-06-01 DIAGNOSIS — Z6841 Body Mass Index (BMI) 40.0 and over, adult: Secondary | ICD-10-CM | POA: Diagnosis not present

## 2023-06-01 DIAGNOSIS — E1169 Type 2 diabetes mellitus with other specified complication: Secondary | ICD-10-CM | POA: Diagnosis not present

## 2023-06-01 DIAGNOSIS — Z7985 Long-term (current) use of injectable non-insulin antidiabetic drugs: Secondary | ICD-10-CM

## 2023-06-01 DIAGNOSIS — Z7984 Long term (current) use of oral hypoglycemic drugs: Secondary | ICD-10-CM

## 2023-06-01 DIAGNOSIS — E785 Hyperlipidemia, unspecified: Secondary | ICD-10-CM | POA: Diagnosis not present

## 2023-06-01 MED ORDER — TIRZEPATIDE 2.5 MG/0.5ML ~~LOC~~ SOAJ: 2.5000 mg | SUBCUTANEOUS | 0 refills | Status: DC

## 2023-06-01 MED ORDER — DAPAGLIFLOZIN PROPANEDIOL 10 MG PO TABS: 10.0000 mg | ORAL_TABLET | Freq: Every day | ORAL | 1 refills | Status: DC

## 2023-06-01 MED ORDER — TIRZEPATIDE 5 MG/0.5ML ~~LOC~~ SOAJ: 5.0000 mg | SUBCUTANEOUS | 1 refills | Status: DC

## 2023-06-01 MED ORDER — ONDANSETRON 4 MG PO TBDP: 4.0000 mg | ORAL_TABLET | Freq: Three times a day (TID) | ORAL | 0 refills | Status: DC | PRN

## 2023-06-01 MED ORDER — TRAMADOL HCL 50 MG PO TABS: 50.0000 mg | ORAL_TABLET | Freq: Two times a day (BID) | ORAL | 1 refills | Status: DC

## 2023-06-01 NOTE — Progress Notes (Signed)
 BP 106/70 (BP Location: Right Arm, Patient Position: Sitting, Cuff Size: Large)   Pulse 79   Ht 5\' 3"  (1.6 m)   Wt 291 lb (132 kg)   SpO2 97%   BMI 51.55 kg/m    Subjective:    Patient ID: Anna Juarez, female    DOB: [birth year], 73 y.o.   MRN: [id number]  HPI: Anna Juarez is a 73 y.o. female  Chief Complaint  Patient presents with   Follow-up    HTN, HLD, DM2 3 monthsFU   DIABETES Using Farxiga. Rybelsus was added at last visit due to A1c increasing to 8.7%.  Is interested in starting injectable GLP1. Hypoglycemic episodes:no Polydipsia/polyuria: no Visual disturbance: no Chest pain: no Paresthesias: no Glucose Monitoring: yes Taking Insulin?: no Blood Pressure Monitoring: not checking Retinal Examination: Up to Date Foot Exam: Up to Date Diabetic Education: Completed Pneumovax:  up to date Influenza: will get in September Aspirin: no  HYPERTENSION / HYPERLIPIDEMIA Satisfied with current treatment? yes Duration of hypertension: chronic BP monitoring frequency: not checking BP medication side effects: no Past BP meds: benazepril, metoprolol,  Duration of hyperlipidemia: chronic Cholesterol medication side effects: no Cholesterol supplements: none Medication compliance: excellent compliance Aspirin: no Recent stressors: no Recurrent headaches: no Visual changes: no Palpitations: no Dyspnea: no Chest pain: no Lower extremity edema: no Dizzy/lightheaded: no  CHRONIC PAIN  Doing well with her Tramadol.  Does need updated prescription.  Present dose: 15 Morphine equivalents Pain control status: controlled Duration: chronic Location: back Quality: aching, sore Current Pain Level: mild Previous Pain Level: moderate Breakthrough pain: no Benefit from narcotic medications: yes What Activities task can be accomplished with current medication? Able to do her ADLs Interested in weaning off narcotics:yes   Stool softners/OTC fiber: yes  Previous pain  specialty evaluation: no Non-narcotic analgesic meds: yes Narcotic contract: yes  Patient states she was recently hospitalized for the flu and stomach bug.  She did go to a SN[facility] for about 13 days after.  She is home and feeling much better.    Relevant past medical, surgical, family and social history reviewed and updated as indicated. Interim medical history since our last visit reviewed. Allergies and medications reviewed and updated.  Review of Systems  Constitutional: Negative.   Respiratory: Negative.    Cardiovascular: Negative.   Gastrointestinal:  Negative for abdominal distention, abdominal pain, anal bleeding, blood in stool, constipation, diarrhea, nausea, rectal pain and vomiting.  Musculoskeletal: Negative.        Left arm pain  Skin: Negative.   Neurological: Negative.   Psychiatric/Behavioral: Negative.      Per HPI unless specifically indicated above     Objective:    BP 106/70 (BP Location: Right Arm, Patient Position: Sitting, Cuff Size: Large)   Pulse 79   Ht 5\' 3"  (1.6 m)   Wt 291 lb (132 kg)   SpO2 97%   BMI 51.55 kg/m   Wt Readings from Last 3 Encounters:  06/01/23 291 lb (132 kg)  04/05/23 281 lb (127.5 kg)  03/01/23 296 lb 6.4 oz (134.4 kg)    Physical Exam Vitals and nursing note reviewed.  Constitutional:      General: She is not in acute distress.    Appearance: Normal appearance. She is obese. She is not ill-appearing, toxic-appearing or diaphoretic.  HENT:     Head: Normocephalic and atraumatic.     Right Ear: External ear normal.     Left Ear: External ear normal.  Nose: Nose normal.     Mouth/Throat:     Mouth: Mucous membranes are moist.     Pharynx: Oropharynx is clear.  Eyes:     General: No scleral icterus.       Right eye: No discharge.        Left eye: No discharge.     Extraocular Movements: Extraocular movements intact.     Conjunctiva/sclera: Conjunctivae normal.     Pupils: Pupils are equal, round, and reactive to  light.  Cardiovascular:     Rate and Rhythm: Normal rate and regular rhythm.     Pulses: Normal pulses.     Heart sounds: Normal heart sounds. No murmur heard.    No friction rub. No gallop.  Pulmonary:     Effort: Pulmonary effort is normal. No respiratory distress.     Breath sounds: Normal breath sounds. No stridor. No wheezing, rhonchi or rales.  Chest:     Chest wall: No tenderness.  Musculoskeletal:        General: Normal range of motion.     Cervical back: Normal range of motion and neck supple.  Skin:    General: Skin is warm and dry.     Capillary Refill: Capillary refill takes less than 2 seconds.     Coloration: Skin is not jaundiced or pale.     Findings: No bruising, erythema, lesion or rash.  Neurological:     General: No focal deficit present.     Mental Status: She is alert and oriented to person, place, and time. Mental status is at baseline.  Psychiatric:        Mood and Affect: Mood normal.        Behavior: Behavior normal.        Thought Content: Thought content normal.        Judgment: Judgment normal.     Results for orders placed or performed in visit on 03/01/23  Comp Met (CMET)   Collection Time: 03/01/23  2:49 PM  Result Value Ref Range   Glucose 142 (H) 70 - 99 mg/dL   BUN 14 8 - 27 mg/dL   Creatinine, Ser 1.61 (H) 0.57 - 1.00 mg/dL   eGFR 43 (L) >09 UE/AVW/0.98   BUN/Creatinine Ratio 11 (L) 12 - 28   Sodium 142 134 - 144 mmol/L   Potassium 4.4 3.5 - 5.2 mmol/L   Chloride 102 96 - 106 mmol/L   CO2 25 20 - 29 mmol/L   Calcium 9.4 8.7 - 10.3 mg/dL   Total Protein 6.8 6.0 - 8.5 g/dL   Albumin 4.1 3.8 - 4.8 g/dL   Globulin, Total 2.7 1.5 - 4.5 g/dL   Bilirubin Total 0.6 0.0 - 1.2 mg/dL   Alkaline Phosphatase 75 44 - 121 IU/L   AST 46 (H) 0 - 40 IU/L   ALT 31 0 - 32 IU/L  Lipid Profile   Collection Time: 03/01/23  2:49 PM  Result Value Ref Range   Cholesterol, Total 167 100 - 199 mg/dL   Triglycerides 119 (H) 0 - 149 mg/dL   HDL 54 >14  mg/dL   VLDL Cholesterol Cal 31 5 - 40 mg/dL   LDL Chol Calc (NIH) 82 0 - 99 mg/dL   Chol/HDL Ratio 3.1 0.0 - 4.4 ratio  HgB A1c   Collection Time: 03/01/23  2:49 PM  Result Value Ref Range   Hgb A1c MFr Bld 8.7 (H) 4.8 - 5.6 %   Est. average glucose Bld gHb Est-mCnc  203 mg/dL      Assessment & Plan:   Problem List Items Addressed This Visit       Cardiovascular and Mediastinum   Hypertension associated with diabetes (HCC)   Chronic.  Under good control on current regimen. Continue current regimen. Continue to monitor. Call with any concerns. Refills up to date. Labs drawn today.       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  Not well controlled.  Last A1c was 8.7%.  Rybelsus was started and increased to 7mg  after last visit and she is tolerating it well.  Patient would like to start [facility] for diabetes control and weight loss.  Discussed side effects of constipation as well as nausea and vomiting.  She has not tolerated Victoza in the past but done well with Rybelsus.  Will start Mounjaro 2.5mg  weekly.  Discussed side effects and benefits of medication.  Discussed how to titrate medication. Follow up in 1 month.  Labs ordered today.        Relevant Medications   tirzepatide (MOUNJARO) 2.5 MG/0.5ML Pen   tirzepatide (MOUNJARO) 5 MG/0.5ML Pen   dapagliflozin propanediol (FARXIGA) 10 MG TABS tablet   Other Relevant Orders   Comprehensive metabolic panel   Hemoglobin A1c   Hyperlipidemia associated with type 2 diabetes mellitus (HCC)   Chronic.  Continue atorvastatin weekly.  Recheck 6 months at follow up.      Relevant Medications   tirzepatide (MOUNJARO) 2.5 MG/0.5ML Pen   tirzepatide (MOUNJARO) 5 MG/0.5ML Pen   dapagliflozin propanediol (FARXIGA) 10 MG TABS tablet    Other Relevant Orders   Lipid panel   Diabetes mellitus treated with injections of non-insulin medication (HCC)   Relevant Medications   tirzepatide (MOUNJARO) 2.5 MG/0.5ML Pen   tirzepatide (MOUNJARO) 5 MG/0.5ML Pen   dapagliflozin propanediol (FARXIGA) 10 MG TABS tablet   RESOLVED: Diabetes mellitus treated with oral medication (HCC)   Relevant Medications   tirzepatide (MOUNJARO) 2.5 MG/0.5ML Pen   tirzepatide (MOUNJARO) 5 MG/0.5ML Pen   dapagliflozin propanediol (FARXIGA) 10 MG TABS tablet     Genitourinary   CKD (chronic kidney disease) stage 3, GFR 30-59 ml/min (HCC)   Rechecking labs today. Await results. Treat as needed.  Continue to follow up with Nephrology.  Continue with Marcelline Deist and ACE.  Starting GLP1 at visit today.         Other   BMI 50.0-59.9, adult (HCC)   Chronic.  Not well controlled.  Patient has tried diet and exercise for weight loss without success.  Will start Mounjaro 2.5mg  weekly.  Will increase to [facility] 5mg  weekly after the first 4 weeks.  Discussed how to inject medication.  Discussed side effects and benefits of medication.  Follow up in 1 months.  Call sooner if concerns arise.        Relevant Medications   tirzepatide (MOUNJARO) 2.5 MG/0.5ML Pen   tirzepatide (MOUNJARO) 5 MG/0.5ML Pen   dapagliflozin propanediol (FARXIGA) 10 MG TABS tablet      Follow up plan: No follow-ups on file.

## 2023-06-01 NOTE — Assessment & Plan Note (Signed)
 Chronic.  Continue atorvastatin weekly.  Recheck 6 months at follow up.

## 2023-06-01 NOTE — Assessment & Plan Note (Signed)
 Chronic.  Under good control on current regimen. Continue current regimen. Continue to monitor. Call with any concerns. Refills up to date. Labs drawn today.

## 2023-06-01 NOTE — Assessment & Plan Note (Signed)
 Chronic.  Not well controlled.  Patient has tried diet and exercise for weight loss without success.  Will start Mounjaro 2.5mg  weekly.  Will increase to [facility] 5mg  weekly after the first 4 weeks.  Discussed how to inject medication.  Discussed side effects and benefits of medication.  Follow up in 1 months.  Call sooner if concerns arise.

## 2023-06-01 NOTE — Assessment & Plan Note (Signed)
 Rechecking labs today. Await results. Treat as needed.  Continue to follow up with Nephrology.  Continue with Marcelline Deist and ACE.  Starting GLP1 at visit today.

## 2023-06-01 NOTE — Assessment & Plan Note (Signed)
 Chronic.  Not well controlled.  Last A1c was 8.7%.  Rybelsus was started and increased to 7mg  after last visit and she is tolerating it well.  Patient would like to start [facility] for diabetes control and weight loss.  Discussed side effects of constipation as well as nausea and vomiting.  She has not tolerated Victoza in the past but done well with Rybelsus.  Will start Mounjaro 2.5mg  weekly.  Discussed side effects and benefits of medication.  Discussed how to titrate medication. Follow up in 1 month.  Labs ordered today.

## 2023-06-02 LAB — COMPREHENSIVE METABOLIC PANEL
ALT: 17 [IU]/L (ref 0–32)
AST: 26 [IU]/L (ref 0–40)
Albumin: 3.9 g/dL (ref 3.8–4.8)
Alkaline Phosphatase: 80 [IU]/L (ref 44–121)
BUN/Creatinine Ratio: 10 — ABNORMAL LOW (ref 12–28)
BUN: 12 mg/dL (ref 8–27)
Bilirubin Total: 0.5 mg/dL (ref 0.0–1.2)
CO2: 22 mmol/L (ref 20–29)
Calcium: 8.7 mg/dL (ref 8.7–10.3)
Chloride: 102 mmol/L (ref 96–106)
Creatinine, Ser: 1.21 mg/dL — ABNORMAL HIGH (ref 0.57–1.00)
Globulin, Total: 2.4 g/dL (ref 1.5–4.5)
Glucose: 135 mg/dL — ABNORMAL HIGH (ref 70–99)
Potassium: 4.6 mmol/L (ref 3.5–5.2)
Sodium: 142 mmol/L (ref 134–144)
Total Protein: 6.3 g/dL (ref 6.0–8.5)
eGFR: 47 mL/min/{1.73_m2} — ABNORMAL LOW (ref >59)

## 2023-06-02 LAB — HEMOGLOBIN A1C
Est. average glucose Bld gHb Est-mCnc: 163 mg/dL
Hgb A1c MFr Bld: 7.3 % — ABNORMAL HIGH (ref 4.8–5.6)

## 2023-06-02 LAB — LIPID PANEL
Chol/HDL Ratio: 3.1 {ratio} (ref 0.0–4.4)
Cholesterol, Total: 129 mg/dL (ref 100–199)
HDL: 42 mg/dL (ref >39)
LDL Chol Calc (NIH): 56 mg/dL (ref 0–99)
Triglycerides: 190 mg/dL — ABNORMAL HIGH (ref 0–149)
VLDL Cholesterol Cal: 31 mg/dL (ref 5–40)

## 2023-06-03 ENCOUNTER — Encounter: Payer: Self-pay | Admitting: Nurse Practitioner

## 2023-06-14 MED ORDER — LINACLOTIDE 145 MCG PO CAPS: 145.0000 ug | ORAL_CAPSULE | Freq: Every day | ORAL | 1 refills | Status: DC

## 2023-06-21 ENCOUNTER — Other Ambulatory Visit: Payer: Self-pay | Admitting: Nurse Practitioner

## 2023-06-21 ENCOUNTER — Other Ambulatory Visit: Payer: Self-pay | Admitting: Cardiovascular Disease

## 2023-06-22 NOTE — Telephone Encounter (Signed)
 Prescription refill request for Eliquis received. Indication:afib Last office visit:10/24 Scr:1.21  2/25 Age: 73 Weight:132  kg  Prescription refilled

## 2023-06-23 NOTE — Telephone Encounter (Signed)
 Unable to refill per protocol, Discontinued on 06/24/22, dose change.  Requested Prescriptions  Pending Prescriptions Disp Refills   LINZESS 72 MCG capsule [Pharmacy Med Name: LINZESS 72 MCG CAPSULE] 90 capsule 1    Sig: TAKE 1 CAPSULE BY MOUTH EVERY DAY BEFORE BREAKFAST     Gastroenterology: Irritable Bowel Syndrome Passed - 06/23/2023  8:58 AM      Passed - Valid encounter within last 12 months    Recent Outpatient Visits           3 months ago BMI 50.0-59.9, adult [facility])   [place] [facility] Larae Grooms, NP   6 months ago Hypertension associated with diabetes [facility])   [place] [facility] [place], Megan P, DO   10 months ago Hypertension associated with diabetes [facility])   [place] [facility] Larae Grooms, NP   11 months ago Acute intractable headache, unspecified headache type   [place] [facility] Larae Grooms, NP   12 months ago Constipation, unspecified constipation type   [place] [facility] Larae Grooms, NP       Future Appointments             In 1 week Larae Grooms, NP [place] [facility]             MOUNJARO 2.5 MG/0.5ML Pen [Pharmacy Med Name: MOUNJARO 2.5 MG/0.5 ML PEN]      Sig: INJECT 2.5 MG SUBCUTANEOUSLY WEEKLY     Off-Protocol Failed - 06/23/2023  8:58 AM      Failed - Medication not assigned to a protocol, review manually.      Passed - Valid encounter within last 12 months    Recent Outpatient Visits           3 months ago BMI 50.0-59.9, adult [facility])   [place] [facility] Larae Grooms, NP   6 months ago Hypertension associated with diabetes [facility])   [place], Megan P, DO   10 months ago Hypertension associated with diabetes [facility])   [place] [facility] Larae Grooms, NP   11 months ago Acute intractable headache, unspecified headache type   [facility] Larae Grooms, NP   12 months ago Constipation, unspecified constipation type   [place] [facility] Larae Grooms, NP       Future Appointments             In 1 week Larae Grooms, NP [facility], [facility]

## 2023-06-23 NOTE — Telephone Encounter (Signed)
 Requested medication (s) are due for refill today: yes  Requested medication (s) are on the active medication list: yes  Last refill:  06/01/23  Future visit scheduled: yes  Notes to clinic:   Medication not assigned to a protocol, review manually.      Requested Prescriptions  Pending Prescriptions Disp Refills   MOUNJARO 2.5 MG/0.5ML Pen [Pharmacy Med Name: MOUNJARO 2.5 MG/0.5 ML PEN]      Sig: INJECT 2.5 MG SUBCUTANEOUSLY WEEKLY     Off-Protocol Failed - 06/23/2023  8:59 AM      Failed - Medication not assigned to a protocol, review manually.      Passed - Valid encounter within last 12 months    Recent Outpatient Visits           3 months ago BMI 50.0-59.9, adult [facility])   [place] [facility]ae Grooms, NP   6 months ago Hypertension associated with diabetes [facility])   [place] [facility] [place], Megan P, DO   10 months ago Hypertension associated with diabetes [facility])   [place] [facility] Larae Grooms, NP   11 months ago Acute intractable headache, unspecified headache type   [place] [facility] Larae Grooms, NP   12 months ago Constipation, unspecified constipation type    [place] [facility] Larae Grooms, NP       Future Appointments             In 1 week Larae Grooms, NP [place] [facility], [facility]            Refused Prescriptions Disp Refills   LINZESS 72 MCG capsule [Pharmacy Med Name: LINZESS 72 MCG CAPSULE] 90 capsule 1    Sig: TAKE 1 CAPSULE BY MOUTH EVERY DAY BEFORE BREAKFAST     Gastroenterology: Irritable Bowel Syndrome Passed - 06/23/2023  8:59 AM      Passed - Valid encounter within last 12 months    Recent Outpatient Visits           3 months ago BMI 50.0-59.9, adult [facility])   [place] [facility] Larae Grooms, NP   6 months ago Hypertension associated with diabetes [facility])   [place] [facility] [place], Megan P, DO   10 months ago Hypertension associated with diabetes [facility])   [place] [facility] Larae Grooms, NP   11 months ago Acute intractable headache, unspecified headache type   [place] [facility] Larae Grooms, NP   12 months ago Constipation, unspecified constipation type   [place] [facility] Larae Grooms, NP       Future Appointments             In 1 week Larae Grooms, NP [place] [facility], [facility]

## 2023-07-01 ENCOUNTER — Ambulatory Visit: Payer: Medicare PPO | Admitting: Nurse Practitioner

## 2023-07-01 ENCOUNTER — Encounter: Payer: Self-pay | Admitting: Nurse Practitioner

## 2023-07-01 VITALS — BP 102/65 | HR 62 | Ht 63.0 in | Wt 277.4 lb

## 2023-07-01 DIAGNOSIS — E1142 Type 2 diabetes mellitus with diabetic polyneuropathy: Secondary | ICD-10-CM

## 2023-07-01 DIAGNOSIS — Z7985 Long-term (current) use of injectable non-insulin antidiabetic drugs: Secondary | ICD-10-CM | POA: Diagnosis not present

## 2023-07-01 NOTE — Assessment & Plan Note (Signed)
 Chronic.  Has lost 14lbs with mounjaro.  However, she is having diarrhea.  Recommend skipping Mounjaro this week.  Giving shot next week, if symptoms return, will change to Ozempic.  Follow up in 2 weeks.  Discussed how to use Imodium safely.

## 2023-07-01 NOTE — Progress Notes (Signed)
 BP 102/65 (BP Location: Right Arm, Patient Position: Sitting, Cuff Size: Large)   Pulse 62   Ht 5\' 3"  (1.6 m)   Wt 277 lb 6.4 oz (125.8 kg)   SpO2 99%   BMI 49.14 kg/m    Subjective:    Patient ID: Anna Juarez, female    DOB: 09-12-1950, 73 y.o.   MRN: [id number]  HPI: Anna Juarez is a 73 y.o. female  Chief Complaint  Patient presents with   Diarrhea    Has had for about 2 weeks, taking immodium but ineffective.    1 month follow up   DIABETES Having Diarrhea x every hour.  Not able to make it through the bathroom at times.   Hypoglycemic episodes:no Polydipsia/polyuria: no Visual disturbance: no Chest pain: no Paresthesias: no Glucose Monitoring: yes Taking Insulin?: no Blood Pressure Monitoring: not checking Retinal Examination: Up to Date Foot Exam: Up to Date Diabetic Education: Completed Pneumovax: up to date Influenza: will get in September Aspirin: no  Relevant past medical, surgical, family and social history reviewed and updated as indicated. Interim medical history since our last visit reviewed. Allergies and medications reviewed and updated.  Review of Systems  Gastrointestinal:  Positive for diarrhea.    Per HPI unless specifically indicated above     Objective:    BP 102/65 (BP Location: Right Arm, Patient Position: Sitting, Cuff Size: Large)   Pulse 62   Ht 5\' 3"  (1.6 m)   Wt 277 lb 6.4 oz (125.8 kg)   SpO2 99%   BMI 49.14 kg/m   Wt Readings from Last 3 Encounters:  07/01/23 277 lb 6.4 oz (125.8 kg)  06/01/23 291 lb (132 kg)  04/05/23 281 lb (127.5 kg)    Physical Exam Vitals and nursing note reviewed.  Constitutional:      General: She is not in acute distress.    Appearance: Normal appearance. She is obese. She is not ill-appearing, toxic-appearing or diaphoretic.  HENT:     Head: Normocephalic.     Right Ear: External ear normal.     Left Ear: External ear normal.     Nose: Nose normal.     Mouth/Throat:     Mouth: Mucous  membranes are moist.     Pharynx: Oropharynx is clear.  Eyes:     General:        Right eye: No discharge.        Left eye: No discharge.     Extraocular Movements: Extraocular movements intact.     Conjunctiva/sclera: Conjunctivae normal.     Pupils: Pupils are equal, round, and reactive to light.  Cardiovascular:     Rate and Rhythm: Normal rate and regular rhythm.     Heart sounds: No murmur heard. Pulmonary:     Effort: Pulmonary effort is normal. No respiratory distress.     Breath sounds: Normal breath sounds. No wheezing or rales.  Musculoskeletal:     Cervical back: Normal range of motion and neck supple.  Skin:    General: Skin is warm and dry.     Capillary Refill: Capillary refill takes less than 2 seconds.  Neurological:     General: No focal deficit present.     Mental Status: She is alert and oriented to person, place, and time. Mental status is at baseline.  Psychiatric:        Mood and Affect: Mood normal.        Behavior: Behavior normal.  Thought Content: Thought content normal.        Judgment: Judgment normal.     Results for orders placed or performed in visit on 06/01/23  Comprehensive metabolic panel   Collection Time: 06/01/23  2:32 PM  Result Value Ref Range   Glucose 135 (H) 70 - 99 mg/dL   BUN 12 8 - 27 mg/dL   Creatinine, Ser 4.09 (H) 0.57 - 1.00 mg/dL   eGFR 47 (L) >81 XB/JYN/8.29   BUN/Creatinine Ratio 10 (L) 12 - 28   Sodium 142 134 - 144 mmol/L   Potassium 4.6 3.5 - 5.2 mmol/L   Chloride 102 96 - 106 mmol/L   CO2 22 20 - 29 mmol/L   Calcium 8.7 8.7 - 10.3 mg/dL   Total Protein 6.3 6.0 - 8.5 g/dL   Albumin 3.9 3.8 - 4.8 g/dL   Globulin, Total 2.4 1.5 - 4.5 g/dL   Bilirubin Total 0.5 0.0 - 1.2 mg/dL   Alkaline Phosphatase 80 44 - 121 IU/L   AST 26 0 - 40 IU/L   ALT 17 0 - 32 IU/L  Hemoglobin A1c   Collection Time: 06/01/23  2:32 PM  Result Value Ref Range   Hgb A1c MFr Bld 7.3 (H) 4.8 - 5.6 %   Est. 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  Has lost 14lbs with mounjaro.  However, she is having diarrhea.  Recommend skipping Mounjaro this week.  Giving shot next week, if symptoms return, will change to Ozempic.  Follow up in 2 weeks.  Discussed how to use Imodium safely.         Follow up plan: Return in about 2 weeks (around 07/15/2023) for Medication Management.

## 2023-07-01 NOTE — Patient Instructions (Signed)
 Imodium Instructions: 4mg  initially Then 2mg  after each stool Do not exceed 16mg /day

## 2023-07-15 ENCOUNTER — Encounter: Payer: Self-pay | Admitting: Nurse Practitioner

## 2023-07-15 ENCOUNTER — Ambulatory Visit: Admitting: Nurse Practitioner

## 2023-07-15 VITALS — BP 116/60 | HR 58 | Temp 97.5°F | Resp 15 | Ht 62.99 in | Wt 280.2 lb

## 2023-07-15 DIAGNOSIS — E1142 Type 2 diabetes mellitus with diabetic polyneuropathy: Secondary | ICD-10-CM | POA: Diagnosis not present

## 2023-07-15 MED ORDER — TRAMADOL HCL 50 MG PO TABS: 50.0000 mg | ORAL_TABLET | Freq: Two times a day (BID) | ORAL | 1 refills | Status: DC

## 2023-07-15 MED ORDER — NYSTATIN 100000 UNIT/GM EX POWD: 1.0000 | Freq: Three times a day (TID) | CUTANEOUS | 1 refills | Status: DC

## 2023-07-15 NOTE — Assessment & Plan Note (Signed)
 Chronic.  Doing well with medication.  Diarrhea has resolved.  Tolerating Mounjaro 5mg  well.  Continue with current dose of medication.  Will follow up in 6 weeks to reevaluation A1c.

## 2023-07-15 NOTE — Progress Notes (Signed)
 BP 116/60 (BP Location: Left Arm, Patient Position: Sitting, Cuff Size: Large)   Pulse (!) 58   Temp (!) 97.5 F (36.4 C) (Oral)   Resp 15   Ht 5' 2.99" (1.6 m)   Wt 280 lb 3.2 oz (127.1 kg)   SpO2 95%   BMI 49.65 kg/m    Subjective:    Patient ID: Anna Juarez, female    DOB: 12/02/1950, 73 y.o.   MRN: [id number]  HPI: Anna Juarez is a 73 y.o. female  Chief Complaint  Patient presents with   Diabetes    Doing well on Mounjaro and no current side effects.    DIABETES No more diarrhea.  Had to restart her Linzess due to constipation.  She has restarted the [facility] and taken two shot sand hasn't had any issues.  Hypoglycemic episodes:no Polydipsia/polyuria: no Visual disturbance: no Chest pain: no Paresthesias: no Glucose Monitoring: yes Taking Insulin?: no Blood Pressure Monitoring: not checking Retinal Examination: Up to Date Foot Exam: Up to Date Diabetic Education: Completed Pneumovax: up to date Influenza: will get in September Aspirin: no   Relevant past medical, surgical, family and social history reviewed and updated as indicated. Interim medical history since our last visit reviewed. Allergies and medications reviewed and updated.  Review of Systems  Gastrointestinal:  Positive for constipation. Negative for diarrhea.    Per HPI unless specifically indicated above     Objective:    BP 116/60 (BP Location: Left Arm, Patient Position: Sitting, Cuff Size: Large)   Pulse (!) 58   Temp (!) 97.5 F (36.4 C) (Oral)   Resp 15   Ht 5' 2.99" (1.6 m)   Wt 280 lb 3.2 oz (127.1 kg)   SpO2 95%   BMI 49.65 kg/m   Wt Readings from Last 3 Encounters:  07/15/23 280 lb 3.2 oz (127.1 kg)  07/01/23 277 lb 6.4 oz (125.8 kg)  06/01/23 291 lb (132 kg)    Physical Exam Vitals and nursing note reviewed.  Constitutional:      General: She is not in acute distress.    Appearance: Normal appearance. She is obese. She is not ill-appearing, toxic-appearing or  diaphoretic.  HENT:     Head: Normocephalic.     Right Ear: External ear normal.     Left Ear: External ear normal.     Nose: Nose normal.     Mouth/Throat:     Mouth: Mucous membranes are moist.     Pharynx: Oropharynx is clear.  Eyes:     General:        Right eye: No discharge.        Left eye: No discharge.     Extraocular Movements: Extraocular movements intact.     Conjunctiva/sclera: Conjunctivae normal.     Pupils: Pupils are equal, round, and reactive to light.  Cardiovascular:     Rate and Rhythm: Normal rate and regular rhythm.     Heart sounds: No murmur heard. Pulmonary:     Effort: Pulmonary effort is normal. No respiratory distress.     Breath sounds: Normal breath sounds. No wheezing or rales.  Musculoskeletal:     Cervical back: Normal range of motion and neck supple.  Skin:    General: Skin is warm and dry.     Capillary Refill: Capillary refill takes less than 2 seconds.  Neurological:     General: No focal deficit present.     Mental Status: She is alert and oriented to  person, place, and time. Mental status is at baseline.  Psychiatric:        Mood and Affect: Mood normal.        Behavior: Behavior normal.        Thought Content: Thought content normal.        Judgment: Judgment normal.     Results for orders placed or performed in visit on 06/01/23  Comprehensive metabolic panel   Collection Time: 06/01/23  2:32 PM  Result Value Ref Range   Glucose 135 (H) 70 - 99 mg/dL   BUN 12 8 - 27 mg/dL   Creatinine, Ser 1.47 (H) 0.57 - 1.00 mg/dL   eGFR 47 (L) >82 NF/AOZ/3.08   BUN/Creatinine Ratio 10 (L) 12 - 28   Sodium 142 134 - 144 mmol/L   Potassium 4.6 3.5 - 5.2 mmol/L   Chloride 102 96 - 106 mmol/L   CO2 22 20 - 29 mmol/L   Calcium 8.7 8.7 - 10.3 mg/dL   Total Protein 6.3 6.0 - 8.5 g/dL   Albumin 3.9 3.8 - 4.8 g/dL   Globulin, Total 2.4 1.5 - 4.5 g/dL   Bilirubin Total 0.5 0.0 - 1.2 mg/dL   Alkaline Phosphatase 80 44 - 121 IU/L   AST 26 0 -  40 IU/L   ALT 17 0 - 32 IU/L  Hemoglobin A1c   Collection Time: 06/01/23  2:32 PM  Result Value Ref Range   Hgb A1c MFr Bld 7.3 (H) 4.8 - 5.6 %   Est. 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  Doing well with medication.  Diarrhea has resolved.  Tolerating Mounjaro 5mg  well.  Continue with current dose of medication.  Will follow up in 6 weeks to reevaluation A1c.         Follow up plan: Return in about 6 weeks (around 08/26/2023) for HTN, HLD, DM2 FU.

## 2023-07-27 ENCOUNTER — Other Ambulatory Visit: Payer: Self-pay | Admitting: Nurse Practitioner

## 2023-07-27 DIAGNOSIS — I10 Essential (primary) hypertension: Secondary | ICD-10-CM

## 2023-07-28 NOTE — Telephone Encounter (Signed)
 Change in dosing- no longer current dose Requested Prescriptions  Pending Prescriptions Disp Refills   benazepril  (LOTENSIN ) 40 MG tablet [Pharmacy Med Name: BENAZEPRIL  HCL 40 MG TABLET] 90 tablet 1    Sig: TAKE 1 TABLET BY MOUTH EVERY DAY     Cardiovascular:  ACE Inhibitors Failed - 07/28/2023 10:54 AM      Failed - Cr in normal range and within 180 days    Creatinine  Date Value Ref Range Status  02/15/2023 1.44 (H) 0.44 - 1.00 mg/dL Final   Creatinine, Ser  Date Value Ref Range Status  06/01/2023 1.21 (H) 0.57 - 1.00 mg/dL Final         Passed - K in normal range and within 180 days    Potassium  Date Value Ref Range Status  06/01/2023 4.6 3.5 - 5.2 mmol/L Final         Passed - Patient is not pregnant      Passed - Last BP in normal range    BP Readings from Last 1 Encounters:  07/15/23 116/60         Passed - Valid encounter within last 6 months    Recent Outpatient Visits           1 week ago DM type 2 with diabetic peripheral neuropathy [facility])   [place] [facility] Aileen Alexanders, NP   3 weeks ago DM type 2 with diabetic peripheral neuropathy [facility])   [place] [facility] [place], Mariah Shines, NP   1 month ago DM type 2 with diabetic peripheral neuropathy [facility])   [place] [facility] Aileen Alexanders, NP              Refused Prescriptions Disp Refills   pantoprazole  (PROTONIX ) 40 MG tablet [Pharmacy Med Name: PANTOPRAZOLE  SOD DR 40 MG TAB] 180 tablet 1    Sig: TAKE 1 TABLET (40 MG TOTAL) BY MOUTH TWICE A DAY BEFORE MEALS     Gastroenterology: Proton Pump Inhibitors Passed - 07/28/2023 10:54 AM      Passed - Valid encounter within last 12 months    Recent Outpatient Visits           1 week ago DM type 2 with diabetic peripheral neuropathy [facility])   [place] [facility] Aileen Alexanders, NP   3 weeks ago DM type 2 with diabetic peripheral neuropathy [facility])   [place] [facility] Aileen Alexanders, NP   1 month ago DM type 2 with diabetic peripheral neuropathy [facility])   [place] [facility] Aileen Alexanders, NP

## 2023-07-28 NOTE — Telephone Encounter (Signed)
 Pantoprazole  03/23/23 #180 1RF- 6 month supply- too soon Requested Prescriptions  Pending Prescriptions Disp Refills   benazepril  (LOTENSIN ) 40 MG tablet [Pharmacy Med Name: BENAZEPRIL  HCL 40 MG TABLET] 90 tablet 1    Sig: TAKE 1 TABLET BY MOUTH EVERY DAY     Cardiovascular:  ACE Inhibitors Failed - 07/28/2023 10:47 AM      Failed - Cr in normal range and within 180 days    Creatinine  Date Value Ref Range Status  02/15/2023 1.44 (H) 0.44 - 1.00 mg/dL Final   Creatinine, Ser  Date Value Ref Range Status  06/01/2023 1.21 (H) 0.57 - 1.00 mg/dL Final         Passed - K in normal range and within 180 days    Potassium  Date Value Ref Range Status  06/01/2023 4.6 3.5 - 5.2 mmol/L Final         Passed - Patient is not pregnant      Passed - Last BP in normal range    BP Readings from Last 1 Encounters:  07/15/23 116/60         Passed - Valid encounter within last 6 months    Recent Outpatient Visits           1 week ago DM type 2 with diabetic peripheral neuropathy [facility])   [place] [facility] Aileen Alexanders, NP   3 weeks ago DM type 2 with diabetic peripheral neuropathy [facility] [place])   [place] [facility] [place], Mariah Shines, NP   1 month ago DM type 2 with diabetic peripheral neuropathy [facility])   [place] [facility] [place], Mariah Shines, NP               pantoprazole  (PROTONIX ) 40 MG tablet [Pharmacy Med Name: PANTOPRAZOLE  SOD DR 40 MG TAB] 180 tablet 1    Sig: TAKE 1 TABLET (40 MG TOTAL) BY MOUTH TWICE A DAY BEFORE MEALS     Gastroenterology: Proton Pump Inhibitors Passed - 07/28/2023 10:47 AM      Passed - Valid encounter within last 12 months    Recent Outpatient Visits           1 week ago DM type 2 with diabetic peripheral neuropathy [facility])   [place] [facility] Aileen Alexanders, NP   3 weeks ago DM type 2 with diabetic peripheral neuropathy [facility])   [place] [facility] Aileen Alexanders,  NP   1 month ago DM type 2 with diabetic peripheral neuropathy [facility])    [facility] Aileen Alexanders, NP

## 2023-08-09 ENCOUNTER — Ambulatory Visit
Admission: RE | Admit: 2023-08-09 | Discharge: 2023-08-09 | Disposition: A | Discharge status: Home / Self Care | Source: Ambulatory Visit | Attending: Nurse Practitioner | Admitting: Nurse Practitioner

## 2023-08-09 DIAGNOSIS — Z1231 Encounter for screening mammogram for malignant neoplasm of breast: Secondary | ICD-10-CM | POA: Diagnosis present

## 2023-08-11 ENCOUNTER — Other Ambulatory Visit: Payer: Self-pay | Admitting: Nurse Practitioner

## 2023-08-13 NOTE — Telephone Encounter (Signed)
 Requested medication (s) are due for refill today: no  Requested medication (s) are on the active medication list: yes  Last refill:  07/15/23 60 grams 1 RF  Future visit scheduled: yes  Notes to clinic:  med not delegated to NT to RF   Requested Prescriptions  Pending Prescriptions Disp Refills   nystatin  (MYCOSTATIN /NYSTOP ) powder [Pharmacy Med Name: NYSTATIN  100,000 UNIT/GM POWD] 60 g     Sig: APPLY TOPICALLY 3 TIMES A DAY     Off-Protocol Failed - 08/13/2023  1:21 PM      Failed - Medication not assigned to a protocol, review manually.      Passed - Valid encounter within last 12 months    Recent Outpatient Visits           4 weeks ago DM type 2 with diabetic peripheral neuropathy [facility])   [place] [facility] [place], Mariah Shines, NP   1 month ago DM type 2 with diabetic peripheral neuropathy [facility])   [place] [facility] Aileen Alexanders, NP   2 months ago DM type 2 with diabetic peripheral neuropathy [facility])   [place] [facility] Aileen Alexanders, NP              Refused Prescriptions Disp Refills   linaclotide  (LINZESS ) 72 MCG capsule [Pharmacy Med Name: LINZESS  72 MCG CAPSULE] 90 capsule     Sig: TAKE 1 CAPSULE BY MOUTH EVERY DAY BEFORE BREAKFAST     Gastroenterology: Irritable Bowel Syndrome Passed - 08/13/2023  1:21 PM      Passed - Valid encounter within last 12 months    Recent Outpatient Visits           4 weeks ago DM type 2 with diabetic peripheral neuropathy [facility])   [place] [facility] Aileen Alexanders, NP   1 month ago DM type 2 with diabetic peripheral neuropathy [facility])   [place] [facility] Aileen Alexanders, NP   2 months ago DM type 2 with diabetic peripheral neuropathy [facility])   [place] [facility] Aileen Alexanders, NP

## 2023-08-13 NOTE — Telephone Encounter (Signed)
 Requested Prescriptions  Pending Prescriptions Disp Refills   linaclotide  (LINZESS ) 72 MCG capsule [Pharmacy Med Name: LINZESS  72 MCG CAPSULE] 90 capsule     Sig: TAKE 1 CAPSULE BY MOUTH EVERY DAY BEFORE BREAKFAST     Gastroenterology: Irritable Bowel Syndrome Passed - 08/13/2023  1:20 PM      Passed - Valid encounter within last 12 months    Recent Outpatient Visits           4 weeks ago DM type 2 with diabetic peripheral neuropathy [facility])   [place] [facility] [place], Mariah Shines, NP   1 month ago DM type 2 with diabetic peripheral neuropathy [facility])   [place] [facility] Aileen Alexanders, NP   2 months ago DM type 2 with diabetic peripheral neuropathy [facility])   [place] [facility] Aileen Alexanders, NP               nystatin  (MYCOSTATIN /NYSTOP ) powder [Pharmacy Med Name: NYSTATIN  100,000 UNIT/GM POWD] 60 g     Sig: APPLY TOPICALLY 3 TIMES A DAY     Off-Protocol Failed - 08/13/2023  1:20 PM      Failed - Medication not assigned to a protocol, review manually.      Passed - Valid encounter within last 12 months    Recent Outpatient Visits           4 weeks ago DM type 2 with diabetic peripheral neuropathy [facility])   [place] [facility] Aileen Alexanders, NP   1 month ago DM type 2 with diabetic peripheral neuropathy [facility])   [place] [facility] Aileen Alexanders, NP   2 months ago DM type 2 with diabetic peripheral neuropathy [facility])   [place] [facility] Aileen Alexanders, NP

## 2023-08-14 ENCOUNTER — Other Ambulatory Visit: Payer: Self-pay | Admitting: Nurse Practitioner

## 2023-08-14 DIAGNOSIS — E119 Type 2 diabetes mellitus without complications: Secondary | ICD-10-CM

## 2023-08-16 NOTE — Telephone Encounter (Signed)
 Requested Prescriptions  Pending Prescriptions Disp Refills   ACCU-CHEK GUIDE TEST test strip [Pharmacy Med Name: ACCU-CHEK GUIDE TEST STRIP] 100 strip 0    Sig: USE AS INSTRUCTED     There is no refill protocol information for this order

## 2023-08-17 ENCOUNTER — Inpatient Hospital Stay: Payer: Medicare PPO | Attending: Internal Medicine

## 2023-08-17 ENCOUNTER — Inpatient Hospital Stay (HOSPITAL_BASED_OUTPATIENT_CLINIC_OR_DEPARTMENT_OTHER): Payer: Medicare PPO | Admitting: Internal Medicine

## 2023-08-17 ENCOUNTER — Ambulatory Visit
Admission: RE | Admit: 2023-08-17 | Discharge: 2023-08-17 | Disposition: A | Discharge status: Home / Self Care | Source: Ambulatory Visit | Attending: Internal Medicine

## 2023-08-17 ENCOUNTER — Ambulatory Visit
Admission: RE | Admit: 2023-08-17 | Discharge: 2023-08-17 | Disposition: A | Discharge status: Home / Self Care | Source: Ambulatory Visit | Attending: Internal Medicine | Admitting: Internal Medicine

## 2023-08-17 ENCOUNTER — Encounter: Payer: Self-pay | Admitting: Internal Medicine

## 2023-08-17 VITALS — BP 104/64 | HR 57 | Temp 96.1°F | Resp 16 | Ht 62.99 in | Wt 273.9 lb

## 2023-08-17 DIAGNOSIS — E1122 Type 2 diabetes mellitus with diabetic chronic kidney disease: Secondary | ICD-10-CM | POA: Insufficient documentation

## 2023-08-17 DIAGNOSIS — D472 Monoclonal gammopathy: Secondary | ICD-10-CM | POA: Insufficient documentation

## 2023-08-17 DIAGNOSIS — G8929 Other chronic pain: Secondary | ICD-10-CM | POA: Insufficient documentation

## 2023-08-17 DIAGNOSIS — E1165 Type 2 diabetes mellitus with hyperglycemia: Secondary | ICD-10-CM | POA: Diagnosis not present

## 2023-08-17 DIAGNOSIS — N1832 Chronic kidney disease, stage 3b: Secondary | ICD-10-CM | POA: Insufficient documentation

## 2023-08-17 DIAGNOSIS — M1990 Unspecified osteoarthritis, unspecified site: Secondary | ICD-10-CM | POA: Insufficient documentation

## 2023-08-17 LAB — CMP (CANCER CENTER ONLY)
ALT: 15 U/L (ref 0–44)
AST: 23 U/L (ref 15–41)
Albumin: 4.2 g/dL (ref 3.5–5.0)
Alkaline Phosphatase: 61 U/L (ref 38–126)
Anion gap: 8 (ref 5–15)
BUN: 24 mg/dL — ABNORMAL HIGH (ref 8–23)
CO2: 27 mmol/L (ref 22–32)
Calcium: 9.1 mg/dL (ref 8.9–10.3)
Chloride: 104 mmol/L (ref 98–111)
Creatinine: 1.42 mg/dL — ABNORMAL HIGH (ref 0.44–1.00)
GFR, Estimated: 39 mL/min — ABNORMAL LOW (ref >60)
Glucose, Bld: 105 mg/dL — ABNORMAL HIGH (ref 70–99)
Potassium: 4 mmol/L (ref 3.5–5.1)
Sodium: 139 mmol/L (ref 135–145)
Total Bilirubin: 0.9 mg/dL (ref 0.0–1.2)
Total Protein: 7.6 g/dL (ref 6.5–8.1)

## 2023-08-17 LAB — CBC WITH DIFFERENTIAL (CANCER CENTER ONLY)
Abs Immature Granulocytes: 0.01 10*3/uL (ref 0.00–0.07)
Basophils Absolute: 0 10*3/uL (ref 0.0–0.1)
Basophils Relative: 1 %
Eosinophils Absolute: 0.1 10*3/uL (ref 0.0–0.5)
Eosinophils Relative: 2 %
HCT: 44.7 % (ref 36.0–46.0)
Hemoglobin: 14.5 g/dL (ref 12.0–15.0)
Immature Granulocytes: 0 %
Lymphocytes Relative: 24 %
Lymphs Abs: 1.3 10*3/uL (ref 0.7–4.0)
MCH: 30.4 pg (ref 26.0–34.0)
MCHC: 32.4 g/dL (ref 30.0–36.0)
MCV: 93.7 fL (ref 80.0–100.0)
Monocytes Absolute: 0.5 10*3/uL (ref 0.1–1.0)
Monocytes Relative: 10 %
Neutro Abs: 3.4 10*3/uL (ref 1.7–7.7)
Neutrophils Relative %: 63 %
Platelet Count: 162 10*3/uL (ref 150–400)
RBC: 4.77 MIL/uL (ref 3.87–5.11)
RDW: 12.6 % (ref 11.5–15.5)
WBC Count: 5.3 10*3/uL (ref 4.0–10.5)
nRBC: 0 % (ref 0.0–0.2)

## 2023-08-17 NOTE — Progress Notes (Unsigned)
 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  Walking independently.   Anna Juarez 73 y.o.  female history of monoclonal gammopathy of unknown significance; and CKD stage III is here for follow-up.  Patient continues to have chronic back pain/ shoudler joint pains. Denies any tingling or numbness.  Denies any weight loss.  Patient also has chronic abdominal pain not any worse.    Review of Systems  Constitutional:  Positive for malaise/fatigue. Negative for chills, diaphoresis, fever and weight loss.  HENT:  Negative for nosebleeds and sore throat.   Eyes:  Negative for double vision.  Respiratory:  Negative for cough, hemoptysis, sputum production, shortness of breath and wheezing.   Cardiovascular:  Negative for chest pain, palpitations, orthopnea and leg swelling.  Gastrointestinal:  Negative for blood in stool, constipation, diarrhea, heartburn, melena, nausea and vomiting.  Genitourinary:  Negative for dysuria, frequency and urgency.  Musculoskeletal:  Positive for back pain and joint pain.  Skin: Negative.  Negative for itching and rash.  Neurological:  Negative for dizziness, tingling, focal weakness, weakness and headaches.  Endo/Heme/Allergies:  Does not bruise/bleed easily.  Psychiatric/Behavioral:  Negative for depression. The patient is not nervous/anxious and does not have insomnia.      MEDICAL HISTORY:  Past Medical History:  Diagnosis Date   Arthritis    Chronic kidney disease    Complication of anesthesia     Diabetes mellitus without complication (HCC)    Extreme obesity    Fatty liver    GERD (gastroesophageal reflux disease)    Hyperlipidemia    Hypertension    Palpitations    PONV (postoperative nausea and vomiting)    Radiculopathy, cervical    Renal insufficiency    stage 3   Sleep apnea    uses cpap   Tear of acetabular labrum 01/2021   right    SURGICAL HISTORY: Past Surgical History:  Procedure Laterality Date   ABDOMINAL HYSTERECTOMY     CARDIAC CATHETERIZATION     [facility]   CARDIOVERSION N/A 12/09/2022   Procedure: CARDIOVERSION;  Surgeon: Devorah Fonder, MD;  Location: [facility] ORS;  Service: Cardiovascular;  Laterality: N/A;   CATARACT EXTRACTION W/PHACO Right 06/02/2021   Procedure: CATARACT EXTRACTION PHACO AND INTRAOCULAR LENS PLACEMENT (IOC) RIGHT DIABETIC;  Surgeon: Rosa College, MD;  Location: [facility];  Service: Ophthalmology;  Laterality: Right;  2.52 0:22.8   CATARACT EXTRACTION W/PHACO Left 06/16/2021   Procedure: CATARACT EXTRACTION PHACO AND INTRAOCULAR LENS PLACEMENT (IOC) LEFT DIABETIC 3.51 00:30.9;  Surgeon: Rosa College, MD;  Location: [facility];  Service: Ophthalmology;  Laterality: Left;   CERVICAL FUSION     x2   CHOLECYSTECTOMY     COLONOSCOPY     COLONOSCOPY WITH PROPOFOL  N/A 09/09/2021   Procedure: COLONOSCOPY WITH PROPOFOL ;  Surgeon: Marnee Sink, MD;  Location: [facility] ENDOSCOPY;  Service: Endoscopy;  Laterality: N/A;   ESOPHAGOGASTRODUODENOSCOPY (EGD) WITH PROPOFOL  N/A 09/09/2021   Procedure: ESOPHAGOGASTRODUODENOSCOPY (EGD) WITH PROPOFOL ;  Surgeon: Marnee Sink, MD;  Location: [facility] ENDOSCOPY;  Service: Endoscopy;  Laterality: N/A;   ESOPHAGOGASTRODUODENOSCOPY (EGD) WITH PROPOFOL  N/A 09/15/2022   Procedure:  ESOPHAGOGASTRODUODENOSCOPY (EGD) WITH PROPOFOL ;  Surgeon: Marnee Sink, MD;  Location: [facility] ENDOSCOPY;  Service: Endoscopy;  Laterality: N/A;   JOINT REPLACEMENT Bilateral    knees    SOCIAL HISTORY: Social  History   Socioeconomic History   Marital status: Widowed    Spouse name: Not on file   Number of children: Not on file   Years of education: Not on file   Highest education level: 12th grade  Occupational History   Occupation: retired   Tobacco Use   Smoking status: Never    Passive exposure: Never   Smokeless tobacco: Never  Vaping Use   Vaping status: Never Used  Substance and Sexual Activity   Alcohol use: No   Drug use: No   Sexual activity: Not Currently  Other Topics Concern   Not on file  Social History Narrative   Live in [place]; lives alone; never smoker; no alcohol; retitred.   Social Drivers of Corporate investment banker Strain: Low Risk  (10/05/2022)   Overall Financial Resource Strain (CARDIA)    Difficulty of Paying Living Expenses: Not hard at all  Food Insecurity: No Food Insecurity (10/05/2022)   Hunger Vital Sign    Worried About Running Out of Food in the Last Year: Never true    Ran Out of Food in the Last Year: Never true  Transportation Needs: No Transportation Needs (10/05/2022)   PRAPARE - Administrator, Civil Service (Medical): No    Lack of Transportation (Non-Medical): No  Physical Activity: Inactive (10/05/2022)   Exercise Vital Sign    Days of Exercise per Week: 0 days    Minutes of Exercise per Session: 0 min  Stress: No Stress Concern Present (10/05/2022)   [employer] of Occupational Health - Occupational Stress Questionnaire    Feeling of Stress : Not at all  Social Connections: Moderately Isolated (10/05/2022)   Social Connection and Isolation Panel [NHANES]    Frequency of Communication with Friends and Family: More than three times a week    Frequency of Social Gatherings with Friends and Family: Once a week    Attends Religious Services: More than 4 times per year    Active Member of [employer] or Organizations: No    Attends Banker Meetings: Never    Marital Status: Widowed  Intimate Partner Violence:  Not At Risk (10/05/2022)   Humiliation, Afraid, Rape, and Kick questionnaire    Fear of Current or Ex-Partner: No    Emotionally Abused: No    Physically Abused: No    Sexually Abused: No    FAMILY HISTORY: Family History  Problem Relation Age of Onset   Diabetes Mother    Heart disease Mother    Hypertension Mother    Heart disease Father    Stroke Sister 59   Cancer Brother    Hypertension Brother    Breast cancer Maternal Aunt    Breast cancer Paternal Aunt    Stroke Daughter     ALLERGIES:  is allergic to actos [pioglitazone], metformin and related, scopolamine, and victoza [liraglutide].  MEDICATIONS:  Current Outpatient Medications  Medication Sig Dispense Refill   ACCU-CHEK GUIDE TEST test strip USE AS INSTRUCTED 100 strip 0   acetaminophen  (TYLENOL ) 500 MG tablet Take 2 tablets (1,000 mg total) by mouth every 6 (six) hours as needed for mild pain.     atorvastatin  (LIPITOR) 40 MG tablet Take 1 tablet (40 mg total) by mouth once a week. 52 tablet  0   benazepril  (LOTENSIN ) 20 MG tablet Take 1 tablet (20 mg total) by mouth daily. 90 tablet 3   dapagliflozin  propanediol (FARXIGA ) 10 MG TABS tablet Take 1 tablet (10 mg total) by mouth daily. 90 tablet 1   ELIQUIS  5 MG TABS tablet TAKE 1 TABLET BY MOUTH TWICE A DAY 180 tablet 3   linaclotide  (LINZESS ) 145 MCG CAPS capsule Take 1 capsule (145 mcg total) by mouth daily before breakfast. 90 capsule 1   metoprolol  succinate (TOPROL -XL) 50 MG 24 hr tablet Take 1 tablet (50 mg total) by mouth daily. TAKE WITH OR IMMEDIATELY FOLLOWING A MEAL in AM and take 25 mg ( 0.5 tablet) in the PM 135 tablet 3   nystatin  (MYCOSTATIN /NYSTOP ) powder APPLY TOPICALLY 3 TIMES A DAY 60 g 1   ondansetron  (ZOFRAN -ODT) 4 MG disintegrating tablet Take 1 tablet (4 mg total) by mouth every 8 (eight) hours as needed for nausea or vomiting. 20 tablet 0   pantoprazole  (PROTONIX ) 40 MG tablet TAKE 1 TABLET (40 MG TOTAL) BY MOUTH TWICE A DAY BEFORE MEALS 180  tablet 1   pregabalin  (LYRICA ) 150 MG capsule Take 1 capsule (150 mg total) by mouth in the morning, at noon, and at bedtime. 270 capsule 1   SODIUM FLUORIDE 5000 PPM 1.1 % PSTE See admin instructions.     tirzepatide (MOUNJARO) 5 MG/0.5ML Pen Inject 5 mg into the skin once a week. 6 mL 1   traMADol  (ULTRAM ) 50 MG tablet Take 1 tablet (50 mg total) by mouth 2 (two) times daily. 60 tablet 1   No current facility-administered medications for this visit.      Aaron Aas  PHYSICAL EXAMINATION:  Vitals:   08/17/23 1251  BP: 104/64  Pulse: (!) 57  Resp: 16  Temp: (!) 96.1 F (35.6 C)  SpO2: 100%   Filed Weights   08/17/23 1251  Weight: 273 lb 14.4 oz (124.2 kg)    Physical Exam Vitals and nursing note reviewed.  HENT:     Head: Normocephalic and atraumatic.     Mouth/Throat:     Pharynx: Oropharynx is clear.  Eyes:     Extraocular Movements: Extraocular movements intact.     Pupils: Pupils are equal, round, and reactive to light.  Cardiovascular:     Rate and Rhythm: Normal rate and regular rhythm.  Pulmonary:     Comments: Decreased breath sounds bilaterally.  Abdominal:     Palpations: Abdomen is soft.  Musculoskeletal:        General: Normal range of motion.     Cervical back: Normal range of motion.  Skin:    General: Skin is warm.  Neurological:     General: No focal deficit present.     Mental Status: She is alert and oriented to person, place, and time.  Psychiatric:        Behavior: Behavior normal.        Judgment: Judgment normal.      LABORATORY DATA:  I have reviewed the data as listed Lab Results  Component Value Date   WBC 5.3 08/17/2023   HGB 14.5 08/17/2023   HCT 44.7 08/17/2023   MCV 93.7 08/17/2023   PLT 162 08/17/2023   Recent Labs    02/15/23 1301 03/01/23 1449 06/01/23 1432 08/17/23 1259  NA 137 142 142 139  K 4.1 4.4 4.6 4.0  CL 103 102 102 104  CO2 22 25 22 27   GLUCOSE 241* 142* 135* 105*  BUN 18 14 12  24*  CREATININE 1.44* 1.31*  1.21* 1.42*  CALCIUM  8.6* 9.4 8.7 9.1  GFRNONAA 38*  --   --  39*  PROT 7.3 6.8 6.3 7.6  ALBUMIN 4.0 4.1 3.9 4.2  AST 36 46* 26 23  ALT 21 31 17 15   ALKPHOS 66 75 80 61  BILITOT 1.0 0.6 0.5 0.9    RADIOGRAPHIC STUDIES: I have personally reviewed the radiological images as listed and agreed with the findings in the report. MM 3D SCREENING MAMMOGRAM BILATERAL BREAST Result Date: 08/11/2023 CLINICAL DATA:  Screening. EXAM: DIGITAL SCREENING BILATERAL MAMMOGRAM WITH TOMOSYNTHESIS AND CAD TECHNIQUE: Bilateral screening digital craniocaudal and mediolateral oblique mammograms were obtained. Bilateral screening digital breast tomosynthesis was performed. The images were evaluated with computer-aided detection. COMPARISON:  Previous exam(s). ACR Breast Density Category b: There are scattered areas of fibroglandular density. FINDINGS: There are no findings suspicious for malignancy. IMPRESSION: No mammographic evidence of malignancy. A result letter of this screening mammogram will be mailed directly to the patient. RECOMMENDATION: Screening mammogram in one year. (Code:SM-B-01Y) BI-RADS CATEGORY  1: Negative. Electronically Signed   By: Sande Cromer M.D.   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 CBC-hemoglobin normal; normal calcium .    # I reviewed the natural history of MGUS; low risk of transformation to multiple myeloma.MM Labs pending today.   Discussed with the patient the bone marrow biopsy and aspiration indication and procedure at length.  Given significant discomfort involved-I would recommend under sedation/with interventional radiology. I discussed the potential complications include-bleeding/trauma and risk of infection; which are fortunately very rare.  Patient is in agreement. Patient will sign the consent prior to the procedure. Bone marrow biopsy/aspiration is  ordered.    # Stage 3b chronic kidney disease- GFR 38 - stable. [Dr.[name]]   # Poorly controlled DM- HbA1c- 7.4-recommend close monitoring of blood sugars/compliance with medications. Stable.   # Hip pain- hip MRI w/o contrast [PCP]-10/26-MRI hip shows no evidence or concerns of myelomatous involvement; more consistent with osteoarthritis. Stable.   # DISPOSITION: # bone survey -ordered.  # bone marrow biopsy in 2 weeks- please order # follow up in  4-5 weeks- MD; NO labs--Dr.B   All questions were answered. The patient knows to call the clinic with any problems, questions or concerns.    Gwyn Leos, MD 08/17/2023 3:41 PM

## 2023-08-17 NOTE — Assessment & Plan Note (Addendum)
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 CBC-hemoglobin normal; normal calcium .  Multiple myeloma labs today pending.  # I reviewed the natural history of MGUS; risk of transformation to multiple myeloma-however given the significant rise in lambda light chains-even though there is no obvious endorgan dysfunction at this time.  I would recommend a bone marrow biopsy.  Also recommend skeletal survey.  Discussed with the patient the bone marrow biopsy and aspiration indication and procedure at length.  Given significant discomfort involved-I would recommend under sedation/with interventional radiology. I discussed the potential complications include-bleeding/trauma and risk of infection; which are fortunately very rare.  Patient is in agreement. Patient will sign the consent prior to the procedure. Bone marrow biopsy/aspiration is ordered.   # Stage 3b chronic kidney disease- GFR 38 - stable. [Dr.[name]]   # Poorly controlled DM- HbA1c- 7.4-recommend close monitoring of blood sugars/compliance with medications. Stable.   # Hip pain- hip MRI w/o contrast [PCP]-10/26-MRI hip shows no evidence or concerns of myelomatous involvement; more consistent with osteoarthritis. Stable.   Beta-2 -micro # DISPOSITION: # bone survey -ordered.  # bone marrow biopsy in 2 weeks- please order # follow up in  4-5 weeks- MD; NO labs--Dr.B

## 2023-08-17 NOTE — Progress Notes (Unsigned)
 No concerns today

## 2023-08-18 LAB — KAPPA/LAMBDA LIGHT CHAINS
Kappa free light chain: 21 mg/L — ABNORMAL HIGH (ref 3.3–19.4)
Kappa, lambda light chain ratio: 0.11 — ABNORMAL LOW (ref 0.26–1.65)
Lambda free light chains: 189.3 mg/L — ABNORMAL HIGH (ref 5.7–26.3)

## 2023-08-19 ENCOUNTER — Telehealth: Payer: Self-pay | Admitting: *Deleted

## 2023-08-19 NOTE — Telephone Encounter (Signed)
 Called pt regarding her appt for BM Bx. Date of procedure will be Wed 5/28 at 8:30a an arrive at 7:30a. Report to [facility] beside the ED. Nothing to eat or drink after midnight due to the use of moderate sedation. She will also need a driver to accompany her.

## 2023-08-20 LAB — MULTIPLE MYELOMA PANEL, SERUM
Albumin SerPl Elph-Mcnc: 3.5 g/dL (ref 2.9–4.4)
Albumin/Glob SerPl: 1.2 (ref 0.7–1.7)
Alpha 1: 0.2 g/dL (ref 0.0–0.4)
Alpha2 Glob SerPl Elph-Mcnc: 0.9 g/dL (ref 0.4–1.0)
B-Globulin SerPl Elph-Mcnc: 1.2 g/dL (ref 0.7–1.3)
Gamma Glob SerPl Elph-Mcnc: 0.7 g/dL (ref 0.4–1.8)
Globulin, Total: 3.1 g/dL (ref 2.2–3.9)
IgA: 307 mg/dL (ref 64–422)
IgG (Immunoglobin G), Serum: 836 mg/dL (ref 586–1602)
IgM (Immunoglobulin M), Srm: 44 mg/dL (ref 26–217)
Total Protein ELP: 6.6 g/dL (ref 6.0–8.5)

## 2023-08-26 ENCOUNTER — Encounter: Payer: Self-pay | Admitting: Nurse Practitioner

## 2023-08-26 ENCOUNTER — Ambulatory Visit: Admitting: Nurse Practitioner

## 2023-08-26 VITALS — BP 112/59 | HR 56 | Temp 98.6°F | Resp 18 | Ht 62.99 in | Wt 274.6 lb

## 2023-08-26 DIAGNOSIS — E119 Type 2 diabetes mellitus without complications: Secondary | ICD-10-CM | POA: Diagnosis not present

## 2023-08-26 DIAGNOSIS — Z7985 Long-term (current) use of injectable non-insulin antidiabetic drugs: Secondary | ICD-10-CM | POA: Diagnosis not present

## 2023-08-26 LAB — MICROALBUMIN, URINE WAIVED
Creatinine, Urine Waived: 200 mg/dL (ref 10–300)
Microalb, Ur Waived: 80 mg/L — ABNORMAL HIGH (ref 0–19)
Microalb/Creat Ratio: <30 mg/g (ref <30)

## 2023-08-26 MED ORDER — TIRZEPATIDE 7.5 MG/0.5ML ~~LOC~~ SOAJ: 7.5000 mg | SUBCUTANEOUS | 1 refills | Status: DC

## 2023-08-26 MED ORDER — TRAMADOL HCL 50 MG PO TABS: 50.0000 mg | ORAL_TABLET | Freq: Two times a day (BID) | ORAL | 1 refills | Status: DC

## 2023-08-26 NOTE — Assessment & Plan Note (Signed)
 Chronic.  Well controlled.  Will increase Mounjaro dose to 7.5mg  weekly to help with weight loss.  Goal is to get A1c down to <7%.  Microalbumin checked today.  Labs ordered.  Follow up in 3 months.  Call sooner if concerns arise.

## 2023-08-26 NOTE — Progress Notes (Signed)
 BP (!) 112/59 (BP Location: Right Arm, Patient Position: Sitting, Cuff Size: Large)   Pulse (!) 56   Temp 98.6 F (37 C) (Oral)   Resp 18   Ht 5' 2.99" (1.6 m)   Wt 274 lb 9.6 oz (124.6 kg)   SpO2 97%   BMI 48.66 kg/m    Subjective:    Patient ID: Anna Juarez, female    DOB: 04/03/1951, 73 y.o.   MRN: [id number]  HPI: Anna Juarez is a 73 y.o. female  Chief Complaint  Patient presents with   Diabetes    Checks during the week. Does check when she is not feeling well and generally feels its lower than her base line. Wondering about dose changes on Monjaro. Sunday does her injection and appetite returns to normal mid week and is very hungry by Friday. No side effects.    Hypertension    No checks at home    Sleep Apnea    Wears as prescribed and continues to benefit from machine.    Chronic Kidney Disease    Good report from her kidney doctor.    DIABETES Takes the medication on Sunday.  Monday and Tuesday she doesn't have much of an appetite then by Thursday Friday it has come back.   Hypoglycemic episodes:no Polydipsia/polyuria: no Visual disturbance: no Chest pain: no Paresthesias: no Glucose Monitoring: yes Taking Insulin?: no Blood Pressure Monitoring: not checking Retinal Examination: Up to Date Foot Exam: Up to Date Diabetic Education: Completed Pneumovax: up to date Influenza: will get in September Aspirin: no   Relevant past medical, surgical, family and social history reviewed and updated as indicated. Interim medical history since our last visit reviewed. Allergies and medications reviewed and updated.  Review of Systems  Eyes:  Negative for visual disturbance.  Cardiovascular:  Negative for chest pain.  Endocrine: Negative for polydipsia and polyuria.  Neurological:  Negative for numbness.    Per HPI unless specifically indicated above     Objective:    BP (!) 112/59 (BP Location: Right Arm, Patient Position: Sitting, Cuff Size: Large)    Pulse (!) 56   Temp 98.6 F (37 C) (Oral)   Resp 18   Ht 5' 2.99" (1.6 m)   Wt 274 lb 9.6 oz (124.6 kg)   SpO2 97%   BMI 48.66 kg/m   Wt Readings from Last 3 Encounters:  08/26/23 274 lb 9.6 oz (124.6 kg)  08/17/23 273 lb 14.4 oz (124.2 kg)  07/15/23 280 lb 3.2 oz (127.1 kg)    Physical Exam Vitals and nursing note reviewed.  Constitutional:      General: She is not in acute distress.    Appearance: Normal appearance. She is obese. She is not ill-appearing, toxic-appearing or diaphoretic.  HENT:     Head: Normocephalic.     Right Ear: External ear normal.     Left Ear: External ear normal.     Nose: Nose normal.     Mouth/Throat:     Mouth: Mucous membranes are moist.     Pharynx: Oropharynx is clear.  Eyes:     General:        Right eye: No discharge.        Left eye: No discharge.     Extraocular Movements: Extraocular movements intact.     Conjunctiva/sclera: Conjunctivae normal.     Pupils: Pupils are equal, round, and reactive to light.  Cardiovascular:     Rate and Rhythm: Normal rate and  regular rhythm.     Heart sounds: No murmur heard. Pulmonary:     Effort: Pulmonary effort is normal. No respiratory distress.     Breath sounds: Normal breath sounds. No wheezing or rales.  Musculoskeletal:     Cervical back: Normal range of motion and neck supple.  Skin:    General: Skin is warm and dry.     Capillary Refill: Capillary refill takes less than 2 seconds.  Neurological:     General: No focal deficit present.     Mental Status: She is alert and oriented to person, place, and time. Mental status is at baseline.  Psychiatric:        Mood and Affect: Mood normal.        Behavior: Behavior normal.        Thought Content: Thought content normal.        Judgment: Judgment normal.     Results for orders placed or performed in visit on 08/17/23  CMP (Cancer Center only)   Collection Time: 08/17/23 12:59 PM  Result Value Ref Range   Sodium 139 135 - 145 mmol/L    Potassium 4.0 3.5 - 5.1 mmol/L   Chloride 104 98 - 111 mmol/L   CO2 27 22 - 32 mmol/L   Glucose, Bld 105 (H) 70 - 99 mg/dL   BUN 24 (H) 8 - 23 mg/dL   Creatinine 6.29 (H) 5.28 - 1.00 mg/dL   Calcium  9.1 8.9 - 10.3 mg/dL   Total Protein 7.6 6.5 - 8.1 g/dL   Albumin 4.2 3.5 - 5.0 g/dL   AST 23 15 - 41 U/L   ALT 15 0 - 44 U/L   Alkaline Phosphatase 61 38 - 126 U/L   Total Bilirubin 0.9 0.0 - 1.2 mg/dL   GFR, Estimated 39 (L) >60 mL/min   Anion gap 8 5 - 15  Multiple Myeloma Panel (SPEP&IFE w/QIG)   Collection Time: 08/17/23 12:59 PM  Result Value Ref Range   IgG (Immunoglobin G), Serum 836 586 - 1,602 mg/dL   IgA 413 64 - 244 mg/dL   IgM (Immunoglobulin M), Srm 44 26 - 217 mg/dL   Total Protein ELP 6.6 6.0 - 8.5 g/dL   Albumin SerPl Elph-Mcnc 3.5 2.9 - 4.4 g/dL   Alpha 1 0.2 0.0 - 0.4 g/dL   Alpha2 Glob SerPl Elph-Mcnc 0.9 0.4 - 1.0 g/dL   B-Globulin SerPl Elph-Mcnc 1.2 0.7 - 1.3 g/dL   Gamma Glob SerPl Elph-Mcnc 0.7 0.4 - 1.8 g/dL   M Protein SerPl Elph-Mcnc Comment (A) Not Observed g/dL   Globulin, Total 3.1 2.2 - 3.9 g/dL   Albumin/Glob SerPl 1.2 0.7 - 1.7   IFE 1 Comment (A)    Please Note Comment   CBC with Differential (Cancer Center Only)   Collection Time: 08/17/23  1:00 PM  Result Value Ref Range   WBC Count 5.3 4.0 - 10.5 K/uL   RBC 4.77 3.87 - 5.11 MIL/uL   Hemoglobin 14.5 12.0 - 15.0 g/dL   HCT 01.0 27.2 - 53.6 %   MCV 93.7 80.0 - 100.0 fL   MCH 30.4 26.0 - 34.0 pg   MCHC 32.4 30.0 - 36.0 g/dL   RDW 64.4 03.4 - 74.2 %   Platelet Count 162 150 - 400 K/uL   nRBC 0.0 0.0 - 0.2 %   Neutrophils Relative % 63 %   Neutro Abs 3.4 1.7 - 7.7 K/uL   Lymphocytes Relative 24 %   Lymphs Abs 1.3  0.7 - 4.0 K/uL   Monocytes Relative 10 %   Monocytes Absolute 0.5 0.1 - 1.0 K/uL   Eosinophils Relative 2 %   Eosinophils Absolute 0.1 0.0 - 0.5 K/uL   Basophils Relative 1 %   Basophils Absolute 0.0 0.0 - 0.1 K/uL   Immature Granulocytes 0 %   Abs Immature Granulocytes  0.01 0.00 - 0.07 K/uL  Kappa/lambda light chains   Collection Time: 08/17/23  1:00 PM  Result Value Ref Range   Kappa free light chain 21.0 (H) 3.3 - 19.4 mg/L   Lambda free light chains 189.3 (H) 5.7 - 26.3 mg/L   Kappa, lambda light chain ratio 0.11 (L) 0.26 - 1.65      Assessment & Plan:   Problem List Items Addressed This Visit       Endocrine   Diabetes mellitus treated with injections of non-insulin medication (HCC) - Primary   Chronic.  Well controlled.  Will increase Mounjaro dose to 7.5mg  weekly to help with weight loss.  Goal is to get A1c down to <7%.  Microalbumin checked today.  Labs ordered.  Follow up in 3 months.  Call sooner if concerns arise.      Relevant Medications   tirzepatide (MOUNJARO) 7.5 MG/0.5ML Pen   Other Relevant Orders   Comp Met (CMET)   HgB A1c   Microalbumin, Urine Waived      Follow up plan: Return in about 3 months (around 11/26/2023) for HTN, HLD, DM2 FU.

## 2023-08-27 ENCOUNTER — Ambulatory Visit: Payer: Self-pay | Admitting: Nurse Practitioner

## 2023-08-27 LAB — COMPREHENSIVE METABOLIC PANEL WITH GFR
ALT: 11 IU/L (ref 0–32)
AST: 22 IU/L (ref 0–40)
Albumin: 4.3 g/dL (ref 3.8–4.8)
Alkaline Phosphatase: 81 IU/L (ref 44–121)
BUN/Creatinine Ratio: 15 (ref 12–28)
BUN: 19 mg/dL (ref 8–27)
Bilirubin Total: 0.5 mg/dL (ref 0.0–1.2)
CO2: 23 mmol/L (ref 20–29)
Calcium: 9.2 mg/dL (ref 8.7–10.3)
Chloride: 101 mmol/L (ref 96–106)
Creatinine, Ser: 1.23 mg/dL — ABNORMAL HIGH (ref 0.57–1.00)
Globulin, Total: 2.5 g/dL (ref 1.5–4.5)
Glucose: 92 mg/dL (ref 70–99)
Potassium: 4 mmol/L (ref 3.5–5.2)
Sodium: 140 mmol/L (ref 134–144)
Total Protein: 6.8 g/dL (ref 6.0–8.5)
eGFR: 46 mL/min/{1.73_m2} — ABNORMAL LOW (ref >59)

## 2023-08-27 LAB — HEMOGLOBIN A1C
Est. average glucose Bld gHb Est-mCnc: 128 mg/dL
Hgb A1c MFr Bld: 6.1 % — ABNORMAL HIGH (ref 4.8–5.6)

## 2023-08-31 ENCOUNTER — Other Ambulatory Visit (HOSPITAL_COMMUNITY): Payer: Self-pay | Admitting: Student

## 2023-08-31 DIAGNOSIS — D472 Monoclonal gammopathy: Secondary | ICD-10-CM

## 2023-08-31 NOTE — Progress Notes (Signed)
 Patient for IR Bone Marrow Biopsy on Wed 09/01/23, I called and spoke with the patient on the phone and gave pre-procedure instructions. Pt was made aware to be here at 7:30a, NPO after MN prior to procedure as well as driver post procedure/recovery/discharge. Pt stated understanding.  Called 08/31/23

## 2023-09-01 ENCOUNTER — Encounter: Payer: Self-pay | Admitting: Radiology

## 2023-09-01 ENCOUNTER — Other Ambulatory Visit: Payer: Self-pay

## 2023-09-01 ENCOUNTER — Ambulatory Visit
Admission: RE | Admit: 2023-09-01 | Discharge: 2023-09-01 | Disposition: A | Discharge status: Home / Self Care | Source: Ambulatory Visit | Attending: Internal Medicine | Admitting: Internal Medicine

## 2023-09-01 DIAGNOSIS — N189 Chronic kidney disease, unspecified: Secondary | ICD-10-CM | POA: Insufficient documentation

## 2023-09-01 DIAGNOSIS — I129 Hypertensive chronic kidney disease with stage 1 through stage 4 chronic kidney disease, or unspecified chronic kidney disease: Secondary | ICD-10-CM | POA: Insufficient documentation

## 2023-09-01 DIAGNOSIS — D696 Thrombocytopenia, unspecified: Secondary | ICD-10-CM | POA: Insufficient documentation

## 2023-09-01 DIAGNOSIS — D472 Monoclonal gammopathy: Secondary | ICD-10-CM | POA: Insufficient documentation

## 2023-09-01 DIAGNOSIS — E1122 Type 2 diabetes mellitus with diabetic chronic kidney disease: Secondary | ICD-10-CM | POA: Diagnosis not present

## 2023-09-01 DIAGNOSIS — Z7985 Long-term (current) use of injectable non-insulin antidiabetic drugs: Secondary | ICD-10-CM | POA: Diagnosis not present

## 2023-09-01 HISTORY — PX: IR BONE MARROW BIOPSY & ASPIRATION: IMG5727

## 2023-09-01 LAB — CBC WITH DIFFERENTIAL/PLATELET
Abs Immature Granulocytes: 0.02 10*3/uL (ref 0.00–0.07)
Basophils Absolute: 0 10*3/uL (ref 0.0–0.1)
Basophils Relative: 1 %
Eosinophils Absolute: 0.2 10*3/uL (ref 0.0–0.5)
Eosinophils Relative: 2 %
HCT: 44.6 % (ref 36.0–46.0)
Hemoglobin: 14.7 g/dL (ref 12.0–15.0)
Immature Granulocytes: 0 %
Lymphocytes Relative: 23 %
Lymphs Abs: 1.7 10*3/uL (ref 0.7–4.0)
MCH: 30.8 pg (ref 26.0–34.0)
MCHC: 33 g/dL (ref 30.0–36.0)
MCV: 93.5 fL (ref 80.0–100.0)
Monocytes Absolute: 0.8 10*3/uL (ref 0.1–1.0)
Monocytes Relative: 10 %
Neutro Abs: 4.6 10*3/uL (ref 1.7–7.7)
Neutrophils Relative %: 64 %
Platelets: 111 10*3/uL — ABNORMAL LOW (ref 150–400)
RBC: 4.77 MIL/uL (ref 3.87–5.11)
RDW: 12.7 % (ref 11.5–15.5)
Smear Review: NORMAL
WBC: 7.2 10*3/uL (ref 4.0–10.5)
nRBC: 0 % (ref 0.0–0.2)

## 2023-09-01 LAB — GLUCOSE, CAPILLARY
Glucose-Capillary: 98 mg/dL (ref 70–99)
Glucose-Capillary: 109 mg/dL — ABNORMAL HIGH (ref 70–99)

## 2023-09-01 MED ORDER — LIDOCAINE 1 % OPTIME INJ - NO CHARGE
6.0000 mL | Freq: Once | INTRAMUSCULAR | Status: AC
  Administered 2023-09-01: 6 mL via INTRADERMAL
  Filled 2023-09-01: qty 6

## 2023-09-01 MED ORDER — FENTANYL CITRATE (PF) 100 MCG/2ML IJ SOLN
INTRAMUSCULAR | Status: AC
  Filled 2023-09-01: qty 2

## 2023-09-01 MED ORDER — HEPARIN SOD (PORK) LOCK FLUSH 100 UNIT/ML IV SOLN
INTRAVENOUS | Status: AC
  Filled 2023-09-01: qty 5

## 2023-09-01 MED ORDER — MIDAZOLAM HCL 2 MG/2ML IJ SOLN
INTRAMUSCULAR | Status: AC
  Filled 2023-09-01: qty 4

## 2023-09-01 MED ORDER — FENTANYL CITRATE (PF) 100 MCG/2ML IJ SOLN
INTRAMUSCULAR | Status: AC | PRN
  Administered 2023-09-01: 50 ug via INTRAVENOUS

## 2023-09-01 MED ORDER — MIDAZOLAM HCL 2 MG/2ML IJ SOLN
INTRAMUSCULAR | Status: AC | PRN
  Administered 2023-09-01: 1 mg via INTRAVENOUS

## 2023-09-01 NOTE — Procedures (Signed)
Interventional Radiology Procedure Note  Procedure: CT guided aspirate and core biopsy of left iliac bone  Complications: None  Recommendations: - Bedrest supine x 1 hrs - Follow biopsy results  Signed,  Criselda Peaches, MD

## 2023-09-01 NOTE — Discharge Instructions (Signed)
 Bone Marrow Aspiration and Bone Marrow Biopsy, Adult, Care After This sheet gives you information about how to care for yourself after your procedure. If you have problems or questions, contact your health care provider.  What can I expect after the procedure?  After the procedure, it is common to have: Mild pain and tenderness. Swelling. Bruising.  Follow these instructions at home: Take over-the-counter or prescription medicines only as told by your health care provider. You may shower tomorrow Remove band aid tomorrow, replace with another bandaid if  site has any drainage from biopsy site. Wash your hands with soap and water before you touch your biopsy site  If soap and water are not available, use hand sanitizer. Change your dressing frequently for bleeding and/or drainage. Check your puncture site every day for signs of infection. Check for: More redness, swelling, or pain. More fluid or blood. Warmth. Pus or a bad smell. Return to your normal activities in 24hours.  Do not drive for 24 hours if you were given a medicine to help you relax (sedative). Keep all follow-up visits as told by your health care provider. This is important. Contact a health care provider if: You have more redness, swelling, or pain around the puncture site. You have more fluid or blood coming from the puncture site. Your puncture site feels warm to the touch. You have pus or a bad smell coming from the puncture site. You have a fever. Your pain is not controlled with medicine. This information is not intended to replace advice given to you by your health care provider. Make sure you discuss any questions you have with your health care provider. Document Released: 10/10/2004 Document Revised: 10/11/2015 Document Reviewed: 09/04/2015 Elsevier Interactive Patient Education  2018 [employer].

## 2023-09-01 NOTE — H&P (Signed)
 Chief Complaint: MGUS  Referring Provider(s): Valentine Gasmen  Supervising Physician: Fernando Hoyer  Patient Status: [facility] - Out-pt  History of Present Illness: Anna Juarez is a 73 y.o. female with  medical issues including CKD, DM, HTN, and arthritis.  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  She is NPO. No nausea/vomiting. No Fever/chills. ROS negative.   Patient is Full Code  Past Medical History:  Diagnosis Date   Arthritis    Chronic kidney disease    Complication of anesthesia    Diabetes mellitus without complication (HCC)    Extreme obesity    Fatty liver    GERD (gastroesophageal reflux disease)    Hyperlipidemia    Hypertension    Palpitations    PONV (postoperative nausea and vomiting)    Radiculopathy, cervical    Renal insufficiency    stage 3   Sleep apnea    uses cpap   Tear of acetabular labrum 01/2021   right    Past Surgical History:  Procedure Laterality Date   ABDOMINAL HYSTERECTOMY     CARDIAC CATHETERIZATION     [facility]   CARDIOVERSION N/A 12/09/2022   Procedure: CARDIOVERSION;  Surgeon: Devorah Fonder, MD;  Location: [facility] ORS;  Service: Cardiovascular;  Laterality: N/A;   CATARACT EXTRACTION W/PHACO Right 06/02/2021   Procedure: CATARACT EXTRACTION PHACO AND INTRAOCULAR LENS PLACEMENT (IOC) RIGHT DIABETIC;  Surgeon: Rosa College, MD;  Location: [facility];  Service: Ophthalmology;  Laterality: Right;  2.52 0:22.8   CATARACT EXTRACTION W/PHACO Left 06/16/2021   Procedure: CATARACT EXTRACTION PHACO AND INTRAOCULAR LENS PLACEMENT (IOC) LEFT DIABETIC 3.51 00:30.9;  Surgeon: Rosa College, MD;  Location: [facility];  Service: Ophthalmology;  Laterality: Left;   CERVICAL FUSION     x2   CHOLECYSTECTOMY     COLONOSCOPY     COLONOSCOPY WITH PROPOFOL  N/A 09/09/2021   Procedure: COLONOSCOPY WITH PROPOFOL ;  Surgeon: Marnee Sink, MD;  Location: [facility]  ENDOSCOPY;  Service: Endoscopy;  Laterality: N/A;   ESOPHAGOGASTRODUODENOSCOPY (EGD) WITH PROPOFOL  N/A 09/09/2021   Procedure: ESOPHAGOGASTRODUODENOSCOPY (EGD) WITH PROPOFOL ;  Surgeon: Marnee Sink, MD;  Location: [facility] ENDOSCOPY;  Service: Endoscopy;  Laterality: N/A;   ESOPHAGOGASTRODUODENOSCOPY (EGD) WITH PROPOFOL  N/A 09/15/2022   Procedure: ESOPHAGOGASTRODUODENOSCOPY (EGD) WITH PROPOFOL ;  Surgeon: Marnee Sink, MD;  Location: [facility] ENDOSCOPY;  Service: Endoscopy;  Laterality: N/A;   JOINT REPLACEMENT Bilateral    knees    Allergies: Actos [pioglitazone], Metformin and related, Scopolamine, and Victoza [liraglutide]  Medications: Prior to Admission medications   Medication Sig Start Date End Date Taking? Authorizing Provider  acetaminophen  (TYLENOL ) 500 MG tablet Take 2 tablets (1,000 mg total) by mouth every 6 (six) hours as needed for mild pain. 02/06/21  Yes Piscoya, Volanda Gruber, MD  benazepril  (LOTENSIN ) 20 MG tablet Take 1 tablet (20 mg total) by mouth daily. 11/30/22  Yes Gollan, Timothy J, MD  dapagliflozin  propanediol (FARXIGA ) 10 MG TABS tablet Take 1 tablet (10 mg total) by mouth daily. 06/01/23  Yes Aileen Alexanders, NP  ELIQUIS  5 MG TABS tablet TAKE 1 TABLET BY MOUTH TWICE A DAY 06/22/23  Yes Gollan, Timothy J, MD  linaclotide  (LINZESS ) 145 MCG CAPS capsule Take 1 capsule (145 mcg total) by mouth daily before breakfast. 06/14/23  Yes Aileen Alexanders, NP  metoprolol  succinate (TOPROL -XL) 50 MG 24 hr tablet Take 1 tablet (50 mg total) by mouth daily. TAKE WITH OR IMMEDIATELY FOLLOWING A  MEAL in AM and take 25 mg ( 0.5 tablet) in the PM 11/30/22  Yes Gollan, Timothy J, MD  nystatin  (MYCOSTATIN /NYSTOP ) powder APPLY TOPICALLY 3 TIMES A DAY 08/16/23  Yes Aileen Alexanders, NP  pantoprazole  (PROTONIX ) 40 MG tablet TAKE 1 TABLET (40 MG TOTAL) BY MOUTH TWICE A DAY BEFORE MEALS 03/23/23  Yes Aileen Alexanders, NP  pregabalin  (LYRICA ) 150 MG capsule Take 1 capsule (150 mg total) by mouth in the  morning, at noon, and at bedtime. 03/01/23  Yes Aileen Alexanders, NP  SODIUM FLUORIDE 5000 PPM 1.1 % PSTE See admin instructions. 05/14/22  Yes [provider]  traMADol  (ULTRAM ) 50 MG tablet Take 1 tablet (50 mg total) by mouth 2 (two) times daily. 08/26/23  Yes Aileen Alexanders, NP  ACCU-CHEK GUIDE TEST test strip USE AS INSTRUCTED 08/16/23   Aileen Alexanders, NP  atorvastatin  (LIPITOR) 40 MG tablet Take 1 tablet (40 mg total) by mouth once a week. 11/26/22   Terre Ferri P, DO  ondansetron  (ZOFRAN -ODT) 4 MG disintegrating tablet Take 1 tablet (4 mg total) by mouth every 8 (eight) hours as needed for nausea or vomiting. 06/01/23   Aileen Alexanders, NP  tirzepatide  (MOUNJARO ) 7.5 MG/0.5ML Pen Inject 7.5 mg into the skin once a week. 08/26/23   Aileen Alexanders, NP     Family History  Problem Relation Age of Onset   Diabetes Mother    Heart disease Mother    Hypertension Mother    Heart disease Father    Stroke Sister 64   Cancer Brother    Hypertension Brother    Breast cancer Maternal Aunt    Breast cancer Paternal Aunt    Stroke Daughter     Social History   Socioeconomic History   Marital status: Widowed    Spouse name: Not on file   Number of children: Not on file   Years of education: Not on file   Highest education level: 12th grade  Occupational History   Occupation: retired   Tobacco Use   Smoking status: Never    Passive exposure: Never   Smokeless tobacco: Never  Vaping Use   Vaping status: Never Used  Substance and Sexual Activity   Alcohol use: No   Drug use: No   Sexual activity: Not Currently  Other Topics Concern   Not on file  Social History Narrative   [place] in [place]; lives alone; never smoker; no alcohol; retitred.   Social Drivers of Corporate investment banker Strain: Low Risk  (10/05/2022)   Overall Financial Resource Strain (CARDIA)    Difficulty of Paying Living Expenses: Not hard at all  Food Insecurity: No Food Insecurity  (10/05/2022)   Hunger Vital Sign    Worried About Running Out of Food in the Last Year: Never true    Ran Out of Food in the Last Year: Never true  Transportation Needs: No Transportation Needs (10/05/2022)   PRAPARE - Administrator, Civil Service (Medical): No    Lack of Transportation (Non-Medical): No  Physical Activity: Inactive (10/05/2022)   Exercise Vital Sign    Days of Exercise per Week: 0 days    Minutes of Exercise per Session: 0 min  Stress: No Stress Concern Present (10/05/2022)   [employer] of Occupational Health - Occupational Stress Questionnaire    Feeling of Stress : Not at all  Social Connections: Moderately Isolated (10/05/2022)   Social Connection and Isolation Panel [NHANES]    Frequency of Communication with  Friends and Family: More than three times a week    Frequency of Social Gatherings with Friends and Family: Once a week    Attends Religious Services: More than 4 times per year    Active Member of [employer] or Organizations: No    Attends Banker Meetings: Never    Marital Status: Widowed     Review of Systems: A 12 point ROS discussed and pertinent positives are indicated in the HPI above.  All other systems are negative.  Review of Systems  Vital Signs: BP 128/63   Pulse 61   Temp 98.1 F (36.7 C) (Oral)   Resp 15   Ht 5\' 3"  (1.6 m)   Wt 275 lb 3.2 oz (124.8 kg)   SpO2 97%   BMI 48.75 kg/m   Advance Care Plan: The advanced care place/surrogate decision maker was discussed at the time of visit and the patient did not wish to discuss or was not able to name a surrogate decision maker or provide an advance care plan.  Physical Exam  Imaging: DG Bone Survey Met Result Date: 08/22/2023 CLINICAL DATA:  Multiple myeloma EXAM: METASTATIC BONE SURVEY COMPARISON:  None Available. FINDINGS: There are no lytic or blastic osseous lesions identified. Degenerative changes affect the lumbar spine. Cervical spinal fusion plate is present.  No acute fracture. IMPRESSION: No lytic or blastic osseous lesions identified. Electronically Signed   By: Tyron Gallon M.D.   On: 08/22/2023 22:11   MM 3D SCREENING MAMMOGRAM BILATERAL BREAST Result Date: 08/11/2023 CLINICAL DATA:  Screening. EXAM: DIGITAL SCREENING BILATERAL MAMMOGRAM WITH TOMOSYNTHESIS AND CAD TECHNIQUE: Bilateral screening digital craniocaudal and mediolateral oblique mammograms were obtained. Bilateral screening digital breast tomosynthesis was performed. The images were evaluated with computer-aided detection. COMPARISON:  Previous exam(s). ACR Breast Density Category b: There are scattered areas of fibroglandular density. FINDINGS: There are no findings suspicious for malignancy. IMPRESSION: No mammographic evidence of malignancy. A result letter of this screening mammogram will be mailed directly to the patient. RECOMMENDATION: Screening mammogram in one year. (Code:SM-B-01Y) BI-RADS CATEGORY  1: Negative. Electronically Signed   By: Sande Cromer M.D.   On: 08/11/2023 13:17    Labs:  CBC: Recent Labs    11/26/22 1117 02/15/23 1301 08/17/23 1300  WBC 5.8 6.9 5.3  HGB 15.2 14.9 14.5  HCT 45.2 46.1* 44.7  PLT 175 174 162    COAGS: No results for input(s): "INR", "APTT" in the last 8760 hours.  BMP: Recent Labs    02/15/23 1301 03/01/23 1449 06/01/23 1432 08/17/23 1259 08/26/23 1416  NA 137 142 142 139 140  K 4.1 4.4 4.6 4.0 4.0  CL 103 102 102 104 101  CO2 22 25 22 27 23   GLUCOSE 241* 142* 135* 105* 92  BUN 18 14 12  24* 19  CALCIUM  8.6* 9.4 8.7 9.1 9.2  CREATININE 1.44* 1.31* 1.21* 1.42* 1.23*  GFRNONAA 38*  --   --  39*  --     LIVER FUNCTION TESTS: Recent Labs    03/01/23 1449 06/01/23 1432 08/17/23 1259 08/26/23 1416  BILITOT 0.6 0.5 0.9 0.5  AST 46* 26 23 22   ALT 31 17 15 11   ALKPHOS 75 80 61 81  PROT 6.8 6.3 7.6 6.8  ALBUMIN 4.1 3.9 4.2 4.3    TUMOR MARKERS: No results for input(s): "AFPTM", "CEA", "CA199", "CHROMGRNA" in the  last 8760 hours.  Assessment and Plan:  M Spike - concern for MGUS.  WIll proceed with image guided  bone marrow biopsy today by Dr. Marne Sings.  Risks and benefits of bone marrow biopsy was discussed with the patient and/or patient's family including, but not limited to bleeding, infection, damage to adjacent structures or low yield requiring additional tests.  All of the questions were answered and there is agreement to proceed.  Consent signed and in chart.    Electronically Signed: Connor Deiters, PA-C   09/01/2023, 8:17 AM      I spent a total of  30 Minutes   in face to face in clinical consultation, greater than 50% of which was counseling/coordinating care for bone marrow biopsy.

## 2023-09-03 LAB — SURGICAL PATHOLOGY

## 2023-09-07 LAB — HM DIABETES EYE EXAM

## 2023-09-08 ENCOUNTER — Other Ambulatory Visit: Payer: Self-pay | Admitting: Nurse Practitioner

## 2023-09-08 DIAGNOSIS — E1159 Type 2 diabetes mellitus with other circulatory complications: Secondary | ICD-10-CM

## 2023-09-09 NOTE — Telephone Encounter (Signed)
 Requested medications are due for refill today.  yes  Requested medications are on the active medications list.  yes  Last refill. 02/29/2024 #270 1 rf  Future visit scheduled.   yes  Notes to clinic.  Refill not delegated.    Requested Prescriptions  Pending Prescriptions Disp Refills   pregabalin  (LYRICA ) 150 MG capsule [Pharmacy Med Name: PREGABALIN  150 MG CAPSULE] 270 capsule     Sig: TAKE 1 CAPSULE BY MOUTH THREE TIMES A DAY     Not Delegated - Neurology:  Anticonvulsants - Controlled - pregabalin  Failed - 09/09/2023  4:41 PM      Failed - This refill cannot be delegated      Failed - Cr in normal range and within 360 days    Creatinine  Date Value Ref Range Status  08/17/2023 1.42 (H) 0.44 - 1.00 mg/dL Final   Creatinine, Ser  Date Value Ref Range Status  08/26/2023 1.23 (H) 0.57 - 1.00 mg/dL Final         Passed - Completed PHQ-2 or PHQ-9 in the last 360 days      Passed - Valid encounter within last 12 months    Recent Outpatient Visits           2 weeks ago Diabetes mellitus treated with injections of non-insulin medication [facility])   [place] [facility] Aileen Alexanders, NP   1 month ago DM type 2 with diabetic peripheral neuropathy [facility])   [facility] Aileen Alexanders, NP   2 months ago DM type 2 with diabetic peripheral neuropathy [facility])   [place] [facility] Aileen Alexanders, NP   3 months ago DM type 2 with diabetic peripheral neuropathy [facility])   [place] [facility] Aileen Alexanders, NP

## 2023-09-10 ENCOUNTER — Encounter (HOSPITAL_COMMUNITY): Payer: Self-pay | Admitting: Internal Medicine

## 2023-09-13 ENCOUNTER — Encounter (HOSPITAL_COMMUNITY): Payer: Self-pay

## 2023-09-21 ENCOUNTER — Encounter: Payer: Self-pay | Admitting: Internal Medicine

## 2023-09-21 ENCOUNTER — Inpatient Hospital Stay: Attending: Internal Medicine | Admitting: Internal Medicine

## 2023-09-21 VITALS — BP 125/71 | HR 61 | Temp 96.4°F | Resp 16 | Ht 63.0 in | Wt 269.3 lb

## 2023-09-21 DIAGNOSIS — Z809 Family history of malignant neoplasm, unspecified: Secondary | ICD-10-CM | POA: Insufficient documentation

## 2023-09-21 DIAGNOSIS — D472 Monoclonal gammopathy: Secondary | ICD-10-CM | POA: Diagnosis not present

## 2023-09-21 DIAGNOSIS — M549 Dorsalgia, unspecified: Secondary | ICD-10-CM | POA: Diagnosis not present

## 2023-09-21 DIAGNOSIS — Z79899 Other long term (current) drug therapy: Secondary | ICD-10-CM | POA: Diagnosis not present

## 2023-09-21 DIAGNOSIS — Z803 Family history of malignant neoplasm of breast: Secondary | ICD-10-CM | POA: Diagnosis not present

## 2023-09-21 DIAGNOSIS — D696 Thrombocytopenia, unspecified: Secondary | ICD-10-CM | POA: Insufficient documentation

## 2023-09-21 DIAGNOSIS — M25559 Pain in unspecified hip: Secondary | ICD-10-CM | POA: Diagnosis not present

## 2023-09-21 DIAGNOSIS — N1832 Chronic kidney disease, stage 3b: Secondary | ICD-10-CM | POA: Insufficient documentation

## 2023-09-21 DIAGNOSIS — E1122 Type 2 diabetes mellitus with diabetic chronic kidney disease: Secondary | ICD-10-CM | POA: Diagnosis not present

## 2023-09-21 DIAGNOSIS — E1165 Type 2 diabetes mellitus with hyperglycemia: Secondary | ICD-10-CM | POA: Diagnosis not present

## 2023-09-21 DIAGNOSIS — G8929 Other chronic pain: Secondary | ICD-10-CM | POA: Insufficient documentation

## 2023-09-21 NOTE — Progress Notes (Signed)
Discuss test results.

## 2023-09-21 NOTE — Assessment & Plan Note (Addendum)
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 CBC-hemoglobin normal; normal calcium . MAy 28th, 2025- BONE MARROW-- Normocellular bone marrow with orderly trilineage hematopoiesis and minimal open involvement by a plasma cell population with aberrant  CD117/CD56 expression and apparent lambda predominance consistent with a plasma cell neoplasm. hile there are a limited number of plasma cells on the biopsy, clot section and aspirate (approximately 5%) with an overall normal immune architecture they do appear lambda predominant which correlates with the serological findings.  In addition, there is aberrant CD56/CD117 expression on the plasma cells consistent with a plasma cell neoplasm.  Cytogenetics- QNS/NEG.   # Discussed that bone marrow/myeloma involvement could be patchy-but given absence of endorgan dysfunction I reviewed with the patient regarding the overall reassuring results of the bone marrow biopsy.  I reviewed again natural history of MGUS-and again lack of progression to multiple myeloma given no obvious endorgan dysfunction at this time-continued surveillance is recommended.  # Thrombocytopenia [likely artifactual given extensive platelet clumping; recommend citrate tube)].  # Stage 3b chronic kidney disease- GFR 38 - stable. [Dr.[name]]-unlikely related to multiple myeloma.  # Poorly controlled DM- HbA1c-6.1 -recommend close monitoring of blood sugars/compliance with medications. Stable.   # Hip pain- hip MRI w/o contrast [PCP]-10/26-MRI hip shows no evidence or concerns of myelomatous involvement; more consistent with osteoarthritis. Stable.   # DISPOSITION: # follow up in 6 months- MD;2 weeks- PRIOR-  labs-cbc/cmp; MM panel; K/l light chains--Dr.B

## 2023-09-21 NOTE — Progress Notes (Signed)
 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 CBC-hemoglobin normal; normal calcium . MAy 28th, 2025- BONE MARROW-- Normocellular bone marrow with orderly trilineage hematopoiesis and minimal open involvement by a plasma cell population with aberrant  CD117/CD56 expression and apparent lambda predominance consistent with a plasma cell neoplasm. hile there are a limited number of plasma cells on the biopsy, clot section and aspirate (approximately 5%) with an overall normal immune architecture they do appear lambda predominant which correlates with the serological findings.  In addition, there is aberrant CD56/CD117 expression on the plasma cells consistent with a plasma cell neoplasm.    #Stage III CKD/diabetes/osteoarthritis  HISTORY OF PRESENTING ILLNESS: Patient is  with her daughter, Anna Juarez.  Walking independently.   Anna Juarez 73 y.o.  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  Bone marrow biopsy was uneventful except mild pain at the site of Biopsy.   Patient continues to have chronic back pain/ shoudler joint pains. Denies any tingling or numbness.  Denies any weight loss.  Patient also has chronic abdominal pain not any worse.    Review of Systems  Constitutional:  Positive for malaise/fatigue. Negative for chills, diaphoresis, fever and weight loss.  HENT:  Negative for  nosebleeds and sore throat.   Eyes:  Negative for double vision.  Respiratory:  Negative for cough, hemoptysis, sputum production, shortness of breath and wheezing.   Cardiovascular:  Negative for chest pain, palpitations, orthopnea and leg swelling.  Gastrointestinal:  Negative for blood in stool, constipation, diarrhea, heartburn, melena, nausea and vomiting.  Genitourinary:  Negative for dysuria, frequency and urgency.  Musculoskeletal:  Positive for back pain and joint pain.  Skin: Negative.  Negative for itching and rash.  Neurological:  Negative for dizziness, tingling, focal weakness, weakness and headaches.  Endo/Heme/Allergies:  Does not bruise/bleed easily.  Psychiatric/Behavioral:  Negative for depression. The patient is not nervous/anxious and does not have insomnia.      MEDICAL HISTORY:  Past Medical History:  Diagnosis Date   Arthritis    Chronic kidney disease    Complication of anesthesia    Diabetes mellitus without complication (HCC)    Extreme obesity    Fatty liver    GERD (gastroesophageal reflux disease)    Hyperlipidemia    Hypertension    Palpitations    PONV (postoperative nausea and vomiting)    Radiculopathy, cervical    Renal insufficiency    stage 3   Sleep apnea    uses cpap   Tear of acetabular labrum 01/2021   right    SURGICAL HISTORY: Past Surgical History:  Procedure Laterality Date   ABDOMINAL HYSTERECTOMY     CARDIAC CATHETERIZATION     [facility]   CARDIOVERSION N/A 12/09/2022   Procedure: CARDIOVERSION;  Surgeon: Devorah Fonder, MD;  Location: [facility] ORS;  Service: Cardiovascular;  Laterality: N/A;   CATARACT EXTRACTION W/PHACO Right 06/02/2021   Procedure: CATARACT EXTRACTION PHACO AND INTRAOCULAR LENS PLACEMENT (IOC) RIGHT DIABETIC;  Surgeon: Rosa College,  MD;  Location: [facility];  Service: Ophthalmology;  Laterality: Right;  2.52 0:22.8   CATARACT EXTRACTION W/PHACO Left 06/16/2021   Procedure: CATARACT EXTRACTION  PHACO AND INTRAOCULAR LENS PLACEMENT (IOC) LEFT DIABETIC 3.51 00:30.9;  Surgeon: Rosa College, MD;  Location: [facility];  Service: Ophthalmology;  Laterality: Left;   CERVICAL FUSION     x2   CHOLECYSTECTOMY     COLONOSCOPY     COLONOSCOPY WITH PROPOFOL  N/A 09/09/2021   Procedure: COLONOSCOPY WITH PROPOFOL ;  Surgeon: Marnee Sink, MD;  Location: [facility]COPY;  Service: Endoscopy;  Laterality: N/A;   ESOPHAGOGASTRODUODENOSCOPY (EGD) WITH PROPOFOL  N/A 09/09/2021   Procedure: ESOPHAGOGASTRODUODENOSCOPY (EGD) WITH PROPOFOL ;  Surgeon: Marnee Sink, MD;  Location: [facility] END[facility]COPY;  Service: Endoscopy;  Laterality: N/A;   ESOPHAGOGASTRODUODENOSCOPY (EGD) WITH PROPOFOL  N/A 09/15/2022   Procedure: ESOPHAGOGASTRODUODENOSCOPY (EGD) WITH PROPOFOL ;  Surgeon: Marnee Sink, MD;  Location: [facility] END[facility];  Service: Endoscopy;  Laterality: N/A;   IR BONE MARROW BIOPSY & ASPIRATION  09/01/2023   JOINT REPLACEMENT Bilateral    knees    SOCIAL HISTORY: Social History   Socioeconomic History   Marital status: Widowed    Spouse name: Not on file   Number of children: Not on file   Years of education: Not on file   Highest education level: 12th grade  Occupational History   Occupation: retired   Tobacco Use   Smoking status: Never    Passive exposure: Never   Smokeless tobacco: Never  Vaping Use   Vaping status: Never Used  Substance and Sexual Activity   Alcohol use: No   Drug use: No   Sexual activity: Not Currently  Other Topics Concern   Not on file  Social History Narrative   Live in [place]; lives alone; never smoker; no alcohol; retitred.   Social Drivers of Corporate investment banker Strain: Low Risk  (10/05/2022)   Overall Financial Resource Strain (CARDIA)    Difficulty of Paying Living Expenses: Not hard at all  Food Insecurity: No Food Insecurity (10/05/2022)   Hunger Vital Sign    Worried About Running Out of Food in the Last Year: Never true    Ran Out of  Food in the Last Year: Never true  Transportation Needs: No Transportation Needs (10/05/2022)   PRAPARE - Administrator, Civil Service (Medical): No    Lack of Transportation (Non-Medical): No  Physical Activity: Inactive (10/05/2022)   Exercise Vital Sign    Days of Exercise per Week: 0 days    Minutes of Exercise per Session: 0 min  Stress: No Stress Concern Present (10/05/2022)   [employer] of Occupational Health - Occupational Stress Questionnaire    Feeling of Stress : Not at all  Social Connections: Moderately Isolated (10/05/2022)   Social Connection and Isolation Panel    Frequency of Communication with Friends and Family: More than three times a week    Frequency of Social Gatherings with Friends and Family: Once a week    Attends Religious Services: More than 4 times per year    Active Member of [employer] or Organizations: No    Attends Banker Meetings: Never    Marital Status: Widowed  Intimate Partner Violence: Not At Risk (10/05/2022)   Humiliation, Afraid, Rape, and Kick questionnaire    Fear of Current or Ex-Partner: No    Emotionally Abused: No    Physically Abused: No    Sexually Abused: No    FAMILY HISTORY:  Family History  Problem Relation Age of Onset   Diabetes Mother    Heart disease Mother    Hypertension Mother    Heart disease Father    Stroke Sister 44   Cancer Brother    Hypertension Brother    Breast cancer Maternal Aunt    Breast cancer Paternal Aunt    Stroke Daughter     ALLERGIES:  is allergic to actos [pioglitazone], metformin and related, scopolamine, and victoza [liraglutide].  MEDICATIONS:  Current Outpatient Medications  Medication Sig Dispense Refill   ACCU-CHEK GUIDE TEST test strip USE AS INSTRUCTED 100 strip 0   acetaminophen  (TYLENOL ) 500 MG tablet Take 2 tablets (1,000 mg total) by mouth every 6 (six) hours as needed for mild pain.     atorvastatin  (LIPITOR) 40 MG tablet Take 1 tablet (40 mg total) by  mouth once a week. 52 tablet 0   benazepril  (LOTENSIN ) 20 MG tablet Take 1 tablet (20 mg total) by mouth daily. 90 tablet 3   dapagliflozin  propanediol (FARXIGA ) 10 MG TABS tablet Take 1 tablet (10 mg total) by mouth daily. 90 tablet 1   ELIQUIS  5 MG TABS tablet TAKE 1 TABLET BY MOUTH TWICE A DAY 180 tablet 3   linaclotide  (LINZESS ) 145 MCG CAPS capsule Take 1 capsule (145 mcg total) by mouth daily before breakfast. 90 capsule 1   metoprolol  succinate (TOPROL -XL) 50 MG 24 hr tablet Take 1 tablet (50 mg total) by mouth daily. TAKE WITH OR IMMEDIATELY FOLLOWING A MEAL in AM and take 25 mg ( 0.5 tablet) in the PM 135 tablet 3   nystatin  (MYCOSTATIN /NYSTOP ) powder APPLY TOPICALLY 3 TIMES A DAY 60 g 1   ondansetron  (ZOFRAN -ODT) 4 MG disintegrating tablet Take 1 tablet (4 mg total) by mouth every 8 (eight) hours as needed for nausea or vomiting. 20 tablet 0   pantoprazole  (PROTONIX ) 40 MG tablet TAKE 1 TABLET (40 MG TOTAL) BY MOUTH TWICE A DAY BEFORE MEALS 180 tablet 1   pregabalin  (LYRICA ) 150 MG capsule TAKE 1 CAPSULE BY MOUTH THREE TIMES A DAY 270 capsule 1   SODIUM FLUORIDE 5000 PPM 1.1 % PSTE See admin instructions.     tirzepatide  (MOUNJARO ) 7.5 MG/0.5ML Pen Inject 7.5 mg into the skin once a week. 6 mL 1   traMADol  (ULTRAM ) 50 MG tablet Take 1 tablet (50 mg total) by mouth 2 (two) times daily. 60 tablet 1   No current facility-administered medications for this visit.      Aaron Aas  PHYSICAL EXAMINATION:  Vitals:   09/21/23 1250 09/21/23 1303  BP: (!) 129/98 125/71  Pulse: 61   Resp: 16   Temp: (!) 96.4 F (35.8 C)   SpO2: 97%    Filed Weights   09/21/23 1250  Weight: 269 lb 4.8 oz (122.2 kg)    Physical Exam Vitals and nursing note reviewed.  HENT:     Head: Normocephalic and atraumatic.     Mouth/Throat:     Pharynx: Oropharynx is clear.   Eyes:     Extraocular Movements: Extraocular movements intact.     Pupils: Pupils are equal, round, and reactive to light.     Cardiovascular:     Rate and Rhythm: Normal rate and regular rhythm.  Pulmonary:     Comments: Decreased breath sounds bilaterally.  Abdominal:     Palpations: Abdomen is soft.   Musculoskeletal:        General: Normal range of motion.     Cervical back: Normal range  of motion.   Skin:    General: Skin is warm.   Neurological:     General: No focal deficit present.     Mental Status: She is alert and oriented to person, place, and time.   Psychiatric:        Behavior: Behavior normal.        Judgment: Judgment normal.      LABORATORY DATA:  I have reviewed the data as listed Lab Results  Component Value Date   WBC 7.2 09/01/2023   HGB 14.7 09/01/2023   HCT 44.6 09/01/2023   MCV 93.5 09/01/2023   PLT 111 (L) 09/01/2023   Recent Labs    02/15/23 1301 03/01/23 1449 06/01/23 1432 08/17/23 1259 08/26/23 1416  NA 137   < > 142 139 140  K 4.1   < > 4.6 4.0 4.0  CL 103   < > 102 104 101  CO2 22   < > 22 27 23   GLUCOSE 241*   < > 135* 105* 92  BUN 18   < > 12 24* 19  CREATININE 1.44*   < > 1.21* 1.42* 1.23*  CALCIUM  8.6*   < > 8.7 9.1 9.2  GFRNONAA 38*  --   --  39*  --   PROT 7.3   < > 6.3 7.6 6.8  ALBUMIN 4.0   < > 3.9 4.2 4.3  AST 36   < > 26 23 22   ALT 21   < > 17 15 11   ALKPHOS 66   < > 80 61 81  BILITOT 1.0   < > 0.5 0.9 0.5   < > = values in this interval not displayed.    RADIOGRAPHIC STUDIES: I have personally reviewed the radiological images as listed and agreed with the findings in the report. IR BONE MARROW BIOPSY & ASPIRATION Result Date: 09/01/2023 INDICATION: MONOCLONAL GAMMOPATHY OF UNDETERMINED SIGNIFICANCE EXAM: FLUOROSCOPICALLY GUIDED BONE MARROW ASPIRATION AND CORE BIOPSY Interventional Radiologist:  Roxie Cord, MD MEDICATIONS: None. ANESTHESIA/SEDATION: Moderate (conscious) sedation was employed during this procedure. A total of 1 milligrams versed  and 50 micrograms fentanyl  were administered intravenously by the Radiology  nurse. The patient's level of consciousness and vital signs were monitored continuously by radiology nursing throughout the procedure under my direct supervision. Total monitored sedation time: 10 minutes FLUOROSCOPY: Radiation exposure index: 32 mGy, reference air kerma COMPLICATIONS: None immediate. Estimated blood loss: <25 mL PROCEDURE: Informed written consent was obtained from the patient after a thorough discussion of the procedural risks, benefits and alternatives. All questions were addressed. Maximal Sterile Barrier Technique was utilized including caps, mask, sterile gowns, sterile gloves, sterile drape, hand hygiene and skin antiseptic. A timeout was performed prior to the initiation of the procedure. The patient was positioned prone and non-contrast localization imaging was performed of the pelvis to demonstrate the iliac marrow spaces. Maximal barrier sterile technique utilized including caps, mask, sterile gowns, sterile gloves, large sterile drape, hand hygiene, and betadine prep. Under sterile conditions and local anesthesia, an 11 gauge coaxial bone biopsy needle was advanced into the left iliac marrow space. Needle position was confirmed with imaging. Initially, bone marrow aspiration was performed. Next, the 11 gauge outer cannula was utilized to obtain a left iliac bone marrow core biopsy. Needle was removed. Hemostasis was obtained with compression. The patient tolerated the procedure well. Samples were prepared with the cytotechnologist. IMPRESSION: Successful fluoroscopically guided bone marrow aspirate and core biopsy of the left iliac bone. Electronically Signed  By: Fernando Hoyer M.D.   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 CBC-hemoglobin normal; normal calcium . MAy 28th, 2025- BONE MARROW-- Normocellular bone marrow with orderly trilineage  hematopoiesis and minimal open involvement by a plasma cell population with aberrant  CD117/CD56 expression and apparent lambda predominance consistent with a plasma cell neoplasm. hile there are a limited number of plasma cells on the biopsy, clot section and aspirate (approximately 5%) with an overall normal immune architecture they do appear lambda predominant which correlates with the serological findings.  In addition, there is aberrant CD56/CD117 expression on the plasma cells consistent with a plasma cell neoplasm.  Cytogenetics- QNS/NEG.   # Discussed that bone marrow/myeloma involvement could be patchy-but given absence of endorgan dysfunction I reviewed with the patient regarding the overall reassuring results of the bone marrow biopsy.  I reviewed again natural history of MGUS-and again lack of progression to multiple myeloma given no obvious endorgan dysfunction at this time-continued surveillance is recommended.  # Thrombocytopenia [likely artifactual given extensive platelet clumping; recommend citrate tube)].  # Stage 3b chronic kidney disease- GFR 38 - stable. [Dr.[name]]-unlikely related to multiple myeloma.  # Poorly controlled DM- HbA1c-6.1 -recommend close monitoring of blood sugars/compliance with medications. Stable.   # Hip pain- hip MRI w/o contrast [PCP]-10/26-MRI hip shows no evidence or concerns of myelomatous involvement; more consistent with osteoarthritis. Stable.   # DISPOSITION: # follow up in 6 months- MD;2 weeks- PRIOR-  labs-cbc/cmp; MM panel; K/l light chains--Dr.B   All questions were answered. The patient knows to call the clinic with any problems, questions or concerns.    Gwyn Leos, MD 09/21/2023 1:32 PM

## 2023-09-22 ENCOUNTER — Other Ambulatory Visit: Payer: Self-pay | Admitting: Nurse Practitioner

## 2023-09-24 NOTE — Telephone Encounter (Signed)
 Requested medication (s) are due for refill today:   Provider to review  Requested medication (s) are on the active medication list:   Yes  Future visit scheduled:   Yes  8/25   Last ordered: 08/26/2023 #60, 1 refill  Non delegated refill    Requested Prescriptions  Pending Prescriptions Disp Refills   traMADol  (ULTRAM ) 50 MG tablet [Pharmacy Med Name: TRAMADOL  HCL 50 MG TABLET] 60 tablet 1    Sig: TAKE 1 TABLET BY MOUTH TWICE A DAY     Not Delegated - Analgesics:  Opioid Agonists Failed - 09/24/2023 12:15 PM      Failed - This refill cannot be delegated      Failed - Urine Drug Screen completed in last 360 days      Passed - Valid encounter within last 3 months    Recent Outpatient Visits           4 weeks ago Diabetes mellitus treated with injections of non-insulin medication (HCC)   [place] [facility] Aileen Alexanders, NP   2 months ago DM type 2 with diabetic peripheral neuropathy [facility])   [facility] [place] [facility] Aileen Alexanders, NP   2 months ago DM type 2 with diabetic peripheral neuropathy [facility])   [place] [facility] Aileen Alexanders, NP   3 months ago DM type 2 with diabetic peripheral neuropathy [facility])   [place] [facility] Aileen Alexanders, NP

## 2023-10-06 ENCOUNTER — Ambulatory Visit: Attending: Medical | Admitting: Medical

## 2023-10-06 ENCOUNTER — Encounter: Payer: Self-pay | Admitting: Medical

## 2023-10-06 VITALS — BP 110/70 | HR 65 | Ht 62.0 in | Wt 265.2 lb

## 2023-10-06 DIAGNOSIS — I4819 Other persistent atrial fibrillation: Secondary | ICD-10-CM | POA: Diagnosis not present

## 2023-10-06 DIAGNOSIS — E1159 Type 2 diabetes mellitus with other circulatory complications: Secondary | ICD-10-CM | POA: Diagnosis not present

## 2023-10-06 DIAGNOSIS — I152 Hypertension secondary to endocrine disorders: Secondary | ICD-10-CM | POA: Diagnosis not present

## 2023-10-06 DIAGNOSIS — E1169 Type 2 diabetes mellitus with other specified complication: Secondary | ICD-10-CM

## 2023-10-06 NOTE — Patient Instructions (Signed)
 Medication Instructions:   Your physician recommends that you continue on your current medications as directed. Please refer to the Current Medication list given to you today.   *If you need a refill on your cardiac medications before your next appointment, please call your pharmacy*  Lab Work:  No labs ordered today   If you have labs (blood work) drawn today and your tests are completely normal, you will receive your results only by: MyChart Message (if you have MyChart) OR A paper copy in the mail If you have any lab test that is abnormal or we need to change your treatment, we will call you to review the results.  Testing/Procedures:  No test ordered today   Follow-Up: At [facility], you and your health needs are our priority.  As part of our continuing mission to provide you with exceptional heart care, our providers are all part of one team.  This team includes your primary Cardiologist (physician) and Advanced Practice Providers or APPs (Physician Assistants and Nurse Practitioners) who all work together to provide you with the care you need, when you need it.  Your next appointment:    12 month(s)  Provider:    You may see the following Advanced Practice Providers on your designated Care Team:    Cadence [place], PA-C

## 2023-10-06 NOTE — Progress Notes (Unsigned)
  Cardiology Office Note   Date:  10/07/2023  ID:  ROSS HEFFERAN, DOB 1950/08/28, MRN [id number] PCP: Melvin Pao, NP  [facility] Providers Cardiologist:  None     History of Present Illness Anna Juarez is a 73 y.o. female with a h/o HTN, heptic cirrhosis, MGUS, DM2, Afib s/p DCCV 12/2022 who presents for follow-up for Afib.   Echo 10/2022 showed normal LVEF, mild to mod MR and mildly dilated left atrium.  The patient was last seen 01/2023 and was stable from a cardiac perspective.   Today, the patient is overall doing well. She had an abnormal bone marrow biopsy and will need another biopsy in 6 months. She denies chest pain, sob, lower leg edema, lightheadedness, dizziness, heat racing, palpitations.   Studies Reviewed EKG Interpretation Date/Time:  Wednesday October 06 2023 14:05:17 EDT Ventricular Rate:  65 PR Interval:  140 QRS Duration:  84 QT Interval:  446 QTC Calculation: 463 R Axis:   -23  Text Interpretation: Sinus rhythm with Premature supraventricular complexes Low voltage QRS When compared with ECG of 14-Jan-2023 11:21, Premature supraventricular complexes are now Present QT has lengthened Confirmed by Franchester, Sharrieff Spratlin ([id number]) on 10/06/2023 2:07:45 PM    Echo 10/2022 1. Left ventricular ejection fraction, by estimation, is 60 to 65%. The  left ventricle has normal function. The left ventricle has no regional  wall motion abnormalities. Left ventricular diastolic parameters are  indeterminate. The average left  ventricular global longitudinal strain is -14.4 %.   2. Right ventricular systolic function is normal. The right ventricular  size is normal. There is mildly elevated pulmonary artery systolic  pressure. The estimated right ventricular systolic pressure is 38.2 mmHg.   3. Left atrial size was mildly dilated.   4. The mitral valve is normal in structure. Mild to moderate mitral valve  regurgitation. No evidence of mitral stenosis.   5. The aortic valve  is normal in structure. Aortic valve regurgitation is  mild. No aortic stenosis is present.   6. The inferior vena cava is normal in size with greater than 50%  respiratory variability, suggesting right atrial pressure of 3 mmHg.       Physical Exam VS:  BP 110/70 (BP Location: Left Arm, Patient Position: Sitting, Cuff Size: Large)   Pulse 65   Ht 5' 2 (1.575 m)   Wt 265 lb 4 oz (120.3 kg)   SpO2 98%   BMI 48.51 kg/m        Wt Readings from Last 3 Encounters:  10/06/23 265 lb 4 oz (120.3 kg)  09/21/23 269 lb 4.8 oz (122.2 kg)  09/01/23 275 lb 3.2 oz (124.8 kg)    GEN: Well nourished, well developed in no acute distress NECK: No JVD; No carotid bruits CARDIAC: RRR, no murmurs, rubs, gallops RESPIRATORY:  Clear to auscultation without rales, wheezing or rhonchi  ABDOMEN: Soft, non-tender, non-distended EXTREMITIES:  No edema; No deformity   ASSESSMENT AND PLAN  Persistent Afib She is in NSR today on EKG. Continue Eliquis  5mg  BID for stroke ppx. Continue Toprol  50mg  for rate control.  HTN BP is normal today, continue toprol .      Dispo: follow-up in 1 year  Signed, Neria Procter VEAR Franchester, PA-C

## 2023-10-13 ENCOUNTER — Other Ambulatory Visit: Payer: Self-pay | Admitting: Family Medicine

## 2023-10-14 NOTE — Telephone Encounter (Signed)
 Requested Prescriptions  Refused Prescriptions Disp Refills   atorvastatin  (LIPITOR) 40 MG tablet [Pharmacy Med Name: ATORVASTATIN  40 MG TABLET] 12 tablet 4    Sig: TAKE 1 TABLET (40 MG TOTAL) BY MOUTH ONCE A WEEK.     Cardiovascular:  Antilipid - Statins Failed - 10/14/2023  5:26 PM      Failed - Lipid Panel in normal range within the last 12 months    Cholesterol, Total  Date Value Ref Range Status  06/01/2023 129 100 - 199 mg/dL Final   Cholesterol Piccolo, Waived  Date Value Ref Range Status  03/21/2018 150 <200 mg/dL Final    Comment:                            Desirable                <200                         Borderline High      200- 239                         High                     >239    LDL Chol Calc (NIH)  Date Value Ref Range Status  06/01/2023 56 0 - 99 mg/dL Final   HDL  Date Value Ref Range Status  06/01/2023 42 >39 mg/dL Final   Triglycerides  Date Value Ref Range Status  06/01/2023 190 (H) 0 - 149 mg/dL Final   Triglycerides Piccolo,Waived  Date Value Ref Range Status  03/21/2018 279 (H) <150 mg/dL Final    Comment:                            Normal                   <150                         Borderline High     150 - 199                         High                200 - 499                         Very High                >499          Passed - Patient is not pregnant      Passed - Valid encounter within last 12 months    Recent Outpatient Visits           1 month ago Diabetes mellitus treated with injections of non-insulin medication (HCC)   [place] [facility] [place], Darice, NP   3 months ago DM type 2 with diabetic peripheral neuropathy [facility])   [place] [facility] Melvin Darice, NP   3 months ago DM type 2 with diabetic peripheral neuropathy [facility])   [place] [facility] Melvin Darice, NP   4 months ago DM type 2 with diabetic peripheral neuropathy (HCC)  [place]  [facility] Melvin Pao, NP

## 2023-10-25 ENCOUNTER — Telehealth: Payer: Self-pay | Admitting: *Deleted

## 2023-10-25 ENCOUNTER — Other Ambulatory Visit: Payer: Self-pay | Admitting: Nurse Practitioner

## 2023-10-25 NOTE — Telephone Encounter (Signed)
 She called back and said that she feels like size of golf ball, and the pain is more worst than before.

## 2023-10-25 NOTE — Telephone Encounter (Signed)
 Patient says that it is just bothering her so much she wondered if maybe needs a x-ray or to see Dr. Rennie what ever Rennie wants her to do.  This wants to have someone to call her and tell her what we are doing

## 2023-10-26 ENCOUNTER — Inpatient Hospital Stay: Attending: Internal Medicine

## 2023-10-26 ENCOUNTER — Ambulatory Visit (HOSPITAL_BASED_OUTPATIENT_CLINIC_OR_DEPARTMENT_OTHER): Admitting: Hospice and Palliative Medicine

## 2023-10-26 ENCOUNTER — Other Ambulatory Visit: Payer: Self-pay

## 2023-10-26 ENCOUNTER — Encounter: Payer: Self-pay | Admitting: Hospice and Palliative Medicine

## 2023-10-26 VITALS — BP 115/71 | HR 67 | Temp 98.6°F | Resp 19 | Wt 256.5 lb

## 2023-10-26 DIAGNOSIS — K5900 Constipation, unspecified: Secondary | ICD-10-CM | POA: Insufficient documentation

## 2023-10-26 DIAGNOSIS — R109 Unspecified abdominal pain: Secondary | ICD-10-CM

## 2023-10-26 DIAGNOSIS — R222 Localized swelling, mass and lump, trunk: Secondary | ICD-10-CM | POA: Insufficient documentation

## 2023-10-26 DIAGNOSIS — D472 Monoclonal gammopathy: Secondary | ICD-10-CM | POA: Insufficient documentation

## 2023-10-26 DIAGNOSIS — M533 Sacrococcygeal disorders, not elsewhere classified: Secondary | ICD-10-CM

## 2023-10-26 LAB — CMP (CANCER CENTER ONLY)
ALT: 15 U/L (ref 0–44)
AST: 30 U/L (ref 15–41)
Albumin: 4.4 g/dL (ref 3.5–5.0)
Alkaline Phosphatase: 70 U/L (ref 38–126)
Anion gap: 11 (ref 5–15)
BUN: 27 mg/dL — ABNORMAL HIGH (ref 8–23)
CO2: 26 mmol/L (ref 22–32)
Calcium: 9.7 mg/dL (ref 8.9–10.3)
Chloride: 102 mmol/L (ref 98–111)
Creatinine: 1.54 mg/dL — ABNORMAL HIGH (ref 0.44–1.00)
GFR, Estimated: 35 mL/min — ABNORMAL LOW (ref >60)
Glucose, Bld: 108 mg/dL — ABNORMAL HIGH (ref 70–99)
Potassium: 4.8 mmol/L (ref 3.5–5.1)
Sodium: 139 mmol/L (ref 135–145)
Total Bilirubin: 1.1 mg/dL (ref 0.0–1.2)
Total Protein: 7.6 g/dL (ref 6.5–8.1)

## 2023-10-26 LAB — CBC WITH DIFFERENTIAL (CANCER CENTER ONLY)
Abs Immature Granulocytes: 0.01 K/uL (ref 0.00–0.07)
Basophils Absolute: 0.1 K/uL (ref 0.0–0.1)
Basophils Relative: 1 %
Eosinophils Absolute: 0.1 K/uL (ref 0.0–0.5)
Eosinophils Relative: 1 %
HCT: 45.3 % (ref 36.0–46.0)
Hemoglobin: 14.8 g/dL (ref 12.0–15.0)
Immature Granulocytes: 0 %
Lymphocytes Relative: 25 %
Lymphs Abs: 1.8 K/uL (ref 0.7–4.0)
MCH: 30 pg (ref 26.0–34.0)
MCHC: 32.7 g/dL (ref 30.0–36.0)
MCV: 91.7 fL (ref 80.0–100.0)
Monocytes Absolute: 0.7 K/uL (ref 0.1–1.0)
Monocytes Relative: 10 %
Neutro Abs: 4.4 K/uL (ref 1.7–7.7)
Neutrophils Relative %: 63 %
Platelet Count: 169 K/uL (ref 150–400)
RBC: 4.94 MIL/uL (ref 3.87–5.11)
RDW: 13.2 % (ref 11.5–15.5)
WBC Count: 7.1 K/uL (ref 4.0–10.5)
nRBC: 0 % (ref 0.0–0.2)

## 2023-10-26 LAB — AMYLASE: Amylase: 38 U/L (ref 28–100)

## 2023-10-26 NOTE — Progress Notes (Unsigned)
 Symptom Management Clinic [facility] [phone] [phone]  Patient Care Team: Melvin Pao, NP as PCP - General Jinny Carmine, MD as Consulting Physician (Gastroenterology) Josefina Chew, MD as Consulting Physician (Orthopedic Surgery) Pandora Cadet, [facility] (Pharmacist) Rennie Cindy SAUNDERS, MD as Consulting Physician (Oncology)   NAME OF PATIENT: Anna Juarez  [id number]  09-25-1950   DATE OF VISIT: 10/26/23  REASON FOR CONSULT: Anna Juarez is a 73 y.o. female with multiple medical problems including stage III CKD, diabetes, OA, and MGUS.   INTERVAL HISTORY: [facility]ral [facility].  Patient noticed a small lump near her sacrum, present for least the past 2 weeks.  Patient states that it has been somewhat sore, particularly when she sits.  She has not noticed any drainage or rashes.  She does endorse constipation states that she has been straining for the past several days to have a bowel movement.  Denies any neurologic complaints. Denies recent fevers or illnesses. Denies any easy bleeding or bruising. Reports good appetite and denies weight loss. Denies chest pain. Denies any nausea, vomiting, or diarrhea. Denies urinary complaints. Patient offers no further specific complaints today.   PAST MEDICAL HISTORY: Past Medical History:  Diagnosis Date   Arthritis    Chronic kidney disease    Complication of anesthesia    Diabetes mellitus without complication (HCC)    Extreme obesity    Fatty liver    GERD (gastroesophageal reflux disease)    Hyperlipidemia    Hypertension    Palpitations    PONV (postoperative nausea and vomiting)    Radiculopathy, cervical    Renal insufficiency    stage 3   Sleep apnea    uses cpap   Tear of acetabular labrum 01/2021   right    PAST SURGICAL HISTORY:  Past Surgical History:  Procedure Laterality Date   ABDOMINAL HYSTERECTOMY      CARDIAC CATHETERIZATION     [facility]   CARDIOVERSION N/A 12/09/2022   Procedure: CARDIOVERSION;  Surgeon: Perla Evalene PARAS, MD;  Location: [facility] ORS;  Service: Cardiovascular;  Laterality: N/A;   CATARACT EXTRACTION W/PHACO Right 06/02/2021   Procedure: CATARACT EXTRACTION PHACO AND INTRAOCULAR LENS PLACEMENT (IOC) RIGHT DIABETIC;  Surgeon: Myrna Adine Anes, MD;  Location: [facility];  Service: Ophthalmology;  Laterality: Right;  2.52 0:22.8   CATARACT EXTRACTION W/PHACO Left 06/16/2021   Procedure: CATARACT EXTRACTION PHACO AND INTRAOCULAR LENS PLACEMENT (IOC) LEFT DIABETIC 3.51 00:30.9;  Surgeon: Myrna Adine Anes, MD;  Location: [facility];  Service: Ophthalmology;  Laterality: Left;   CERVICAL FUSION     x2   CHOLECYSTECTOMY     COLONOSCOPY     COLONOSCOPY WITH PROPOFOL  N/A 09/09/2021   Procedure: COLONOSCOPY WITH PROPOFOL ;  Surgeon: Jinny Carmine, MD;  Location: [facility] ENDOSCOPY;  Service: Endoscopy;  Laterality: N/A;   ESOPHAGOGASTRODUODENOSCOPY (EGD) WITH PROPOFOL  N/A 09/09/2021   Procedure: ESOPHAGOGASTRODUODENOSCOPY (EGD) WITH PROPOFOL ;  Surgeon: Jinny Carmine, MD;  Location: [facility] ENDOSCOPY;  Service: Endoscopy;  Laterality: N/A;   ESOPHAGOGASTRODUODENOSCOPY (EGD) WITH PROPOFOL  N/A 09/15/2022   Procedure: ESOPHAGOGASTRODUODENOSCOPY (EGD) WITH PROPOFOL ;  Surgeon: Jinny Carmine, MD;  Location: [facility] ENDOSCOPY;  Service: Endoscopy;  Laterality: N/A;   IR BONE MARROW BIOPSY & ASPIRATION  09/01/2023   JOINT REPLACEMENT Bilateral    knees    HEMATOLOGY/ONCOLOGY HISTORY:  Oncology History   No history exists.    ALLERGIES:  is allergic to actos [pioglitazone], metformin and  related, scopolamine, and victoza [liraglutide].  MEDICATIONS:  Current Outpatient Medications  Medication Sig Dispense Refill   ACCU-CHEK GUIDE TEST test strip USE AS INSTRUCTED 100 strip 0   acetaminophen  (TYLENOL ) 500 MG tablet Take 2 tablets (1,000 mg total) by mouth every 6 (six) hours as needed  for mild pain.     atorvastatin  (LIPITOR) 40 MG tablet Take 1 tablet (40 mg total) by mouth once a week. 52 tablet 0   benazepril  (LOTENSIN ) 20 MG tablet Take 1 tablet (20 mg total) by mouth daily. 90 tablet 3   dapagliflozin  propanediol (FARXIGA ) 10 MG TABS tablet Take 1 tablet (10 mg total) by mouth daily. 90 tablet 1   ELIQUIS  5 MG TABS tablet TAKE 1 TABLET BY MOUTH TWICE A DAY 180 tablet 3   linaclotide  (LINZESS ) 145 MCG CAPS capsule Take 1 capsule (145 mcg total) by mouth daily before breakfast. 90 capsule 1   metoprolol  succinate (TOPROL -XL) 50 MG 24 hr tablet Take 1 tablet (50 mg total) by mouth daily. TAKE WITH OR IMMEDIATELY FOLLOWING A MEAL in AM and take 25 mg ( 0.5 tablet) in the PM 135 tablet 3   nystatin  (MYCOSTATIN /NYSTOP ) powder APPLY TOPICALLY 3 TIMES A DAY 60 g 1   ondansetron  (ZOFRAN -ODT) 4 MG disintegrating tablet Take 1 tablet (4 mg total) by mouth every 8 (eight) hours as needed for nausea or vomiting. 20 tablet 0   pantoprazole  (PROTONIX ) 40 MG tablet TAKE 1 TABLET (40 MG TOTAL) BY MOUTH TWICE A DAY BEFORE MEALS 180 tablet 1   pregabalin  (LYRICA ) 150 MG capsule TAKE 1 CAPSULE BY MOUTH THREE TIMES A DAY 270 capsule 1   SODIUM FLUORIDE 5000 PPM 1.1 % PSTE See admin instructions.     tirzepatide  (MOUNJARO ) 7.5 MG/0.5ML Pen Inject 7.5 mg into the skin once a week. 6 mL 1   traMADol  (ULTRAM ) 50 MG tablet Take 1 tablet (50 mg total) by mouth 2 (two) times daily. 60 tablet 1   No current facility-administered medications for this visit.    VITAL SIGNS: BP 115/71   Pulse 67   Temp 98.6 F (37 C)   Resp 19   Wt 256 lb 8 oz (116.3 kg)   SpO2 98%   BMI 46.91 kg/m  Filed Weights   10/26/23 1505  Weight: 256 lb 8 oz (116.3 kg)    Estimated body mass index is 46.91 kg/m as calculated from the following:   Height as of 10/06/23: 5' 2 (1.575 m).   Weight as of this encounter: 256 lb 8 oz (116.3 kg).  LABS: CBC:    Component Value Date/Time   WBC 7.1 10/26/2023 1451    WBC 7.2 09/01/2023 0743   HGB 14.8 10/26/2023 1451   HGB 15.2 11/26/2022 1117   HCT 45.3 10/26/2023 1451   HCT 45.2 11/26/2022 1117   PLT 169 10/26/2023 1451   PLT 175 11/26/2022 1117   MCV 91.7 10/26/2023 1451   MCV 93 11/26/2022 1117   NEUTROABS 4.4 10/26/2023 1451   NEUTROABS 3.6 11/26/2022 1117   LYMPHSABS 1.8 10/26/2023 1451   LYMPHSABS 1.5 11/26/2022 1117   MONOABS 0.7 10/26/2023 1451   EOSABS 0.1 10/26/2023 1451   EOSABS 0.1 11/26/2022 1117   BASOSABS 0.1 10/26/2023 1451   BASOSABS 0.1 11/26/2022 1117   Comprehensive Metabolic Panel:    Component Value Date/Time   NA 139 10/26/2023 1452   NA 140 08/26/2023 1416   K 4.8 10/26/2023 1452   CL 102 10/26/2023 1452  CO2 26 10/26/2023 1452   BUN 27 (H) 10/26/2023 1452   BUN 19 08/26/2023 1416   CREATININE 1.54 (H) 10/26/2023 1452   GLUCOSE 108 (H) 10/26/2023 1452   CALCIUM  9.7 10/26/2023 1452   AST 30 10/26/2023 1452   ALT 15 10/26/2023 1452   ALKPHOS 70 10/26/2023 1452   BILITOT 1.1 10/26/2023 1452   PROT 7.6 10/26/2023 1452   PROT 6.8 08/26/2023 1416   ALBUMIN 4.4 10/26/2023 1452   ALBUMIN 4.3 08/26/2023 1416    RADIOGRAPHIC STUDIES: No results found.  PERFORMANCE STATUS (ECOG) : 0 - Asymptomatic  Review of Systems Unless otherwise noted, a complete review of systems is negative.  Physical Exam General: NAD Pulmonary: Unlabored Abdomen: soft, nontender, + bowel sounds GU: no suprapubic tenderness Extremities: no edema, no joint deformities Skin: small circular soft tissue mass near right side of gluteal folds.  This appears to be movable and nonfluctuant. Neurological: nonfocal  Exam chaperoned by Jesusa Holts, RN  IMPRESSION/PLAN: MGUS -on surveillance  Gluteal soft tissue mass -suspect cyst.  Patient declined referral to general surgeon.  Agreed to send for soft tissue ultrasound for further evaluation.  However, patient reassured that this is unrelated to her MGUS.    Constipation -recommend  daily bowel regimen with MiraLAX/Senokot.  Target soft stools daily or every other day.  Case and plan discussed with Dr. Rennie   Patient expressed understanding and was in agreement with this plan. She also understands that She can call clinic at any time with any questions, concerns, or complaints.   Thank you for allowing me to participate in the care of this very pleasant patient.   Time Total: 15 minutes  Visit consisted of counseling and education dealing with the complex and emotionally intense issues of symptom management in the setting of serious illness.Greater than 50%  of this time was spent counseling and coordinating care related to the above assessment and plan.  Signed by: Fonda Mower, PhD, NP-C

## 2023-10-27 NOTE — Telephone Encounter (Signed)
 Requested Prescriptions  Pending Prescriptions Disp Refills   pantoprazole  (PROTONIX ) 40 MG tablet [Pharmacy Med Name: PANTOPRAZOLE  SOD DR 40 MG TAB] 180 tablet 0    Sig: TAKE 1 TABLET (40 MG TOTAL) BY MOUTH TWICE A DAY BEFORE MEALS     Gastroenterology: Proton Pump Inhibitors Passed - 10/27/2023 12:29 PM      Passed - Valid encounter within last 12 months    Recent Outpatient Visits           2 months ago Diabetes mellitus treated with injections of non-insulin medication [facility])   [place] [facility] Rom[place], Darice, NP   3 months ago DM type 2 with diabetic peripheral neuropathy [facility])   [facility] Melvin Darice, NP   3 months ago DM type 2 with diabetic peripheral neuropathy [facility])   [place] [facility] Melvin Darice, NP   4 months ago DM type 2 with diabetic peripheral neuropathy [facility])   [facility] Melvin Darice, NP

## 2023-10-29 ENCOUNTER — Ambulatory Visit
Admission: RE | Admit: 2023-10-29 | Discharge: 2023-10-29 | Disposition: A | Discharge status: Home / Self Care | Source: Ambulatory Visit | Attending: Hospice and Palliative Medicine | Admitting: Hospice and Palliative Medicine

## 2023-10-29 DIAGNOSIS — M533 Sacrococcygeal disorders, not elsewhere classified: Secondary | ICD-10-CM | POA: Insufficient documentation

## 2023-11-04 ENCOUNTER — Telehealth: Payer: Self-pay

## 2023-11-04 DIAGNOSIS — M533 Sacrococcygeal disorders, not elsewhere classified: Secondary | ICD-10-CM

## 2023-11-04 NOTE — Telephone Encounter (Signed)
 Called and spoke to patient in reference to her soft tissue US  results. Per [employer], US  looks clear and nothing suspicious. Offered referral to General Surgery to further evaluate and patient was in agreement of this plan. Advised I would put referral in and they should contact her for scheduling.Patient verbalized understand.

## 2023-11-08 ENCOUNTER — Ambulatory Visit (INDEPENDENT_AMBULATORY_CARE_PROVIDER_SITE_OTHER): Admitting: Surgery

## 2023-11-08 ENCOUNTER — Encounter: Payer: Self-pay | Admitting: Surgery

## 2023-11-08 VITALS — BP 119/68 | HR 65 | Temp 97.8°F | Ht 62.0 in | Wt 257.0 lb

## 2023-11-08 DIAGNOSIS — M533 Sacrococcygeal disorders, not elsewhere classified: Secondary | ICD-10-CM | POA: Diagnosis not present

## 2023-11-08 NOTE — Patient Instructions (Addendum)
 We placed a order for you to have a Xray done. Xrays are walk in,You may go to [facility] or Outpatient imaging center Monday-Friday 9-3:30pm  We will call you once we have the results back    Tailbone Injury  A tailbone injury is an injury on the small bone at the lower end of the backbone (spine). The injury can happen if the tailbone is bruised, the ligaments are stretched, or the bone is broken (fracture). What are the causes? A falling and landing on the tailbone. Strain or friction on the tailbone. This comes from sitting for long periods of time, or rowing or biking. Giving birth. What are the signs or symptoms? Pain in the tailbone area or lower back. Pain or difficulty when standing up from a sitting position. Bruising or swelling in the tailbone area. Pain when pooping. In females, pain during sex. How is this treated? Most tailbone injuries get better on their own in 4-6 weeks. If you need treatment, it may include: Taking medicines for pain. Using a rubber or inflated ring or cushion when sitting. Doing physical therapy. Getting shots (injections) of local anesthesia and steroid medicine. Follow these instructions at home: Activity Rest as told by your doctor. Do not sit for a long time without moving. Get up to take short walks every 1 to 2 hours. Ask for help if you feel weak or unsteady. Wear proper pads and gear when riding a bike or rowing. Do exercises as told by your doctor or physical therapist. Increase your activity as the pain allows. Return to your normal activities when your doctor says that it is safe. Managing pain, stiffness, and swelling To lessen pain: Sit on a large rubber or inflated ring or cushion. Lean forward when you sit. If told, put ice on the injured area. To do this: Put ice in a plastic bag. Place a towel between your skin and the bag. Leave the ice on for 20 minutes, 2-3 times per day. Do this for the first 1-2 days. If your  skin turns bright red, take off the ice right away to prevent skin damage. The risk of skin damage is higher if you cannot feel pain, heat, or cold. If told, put heat on the injured area. Do this as often as told by your doctor. Use the heat source that your doctor recommends, such as a moist heat pack or a heating pad. Place a towel between your skin and the heat source. Leave the heat on for 20-30 minutes. If your skin turns bright red, take off the heat right away to prevent burns. The risk of burns is higher if you cannot feel pain, heat, or cold. General instructions Take over-the-counter and prescription medicines only as told by your doctor. You may need to take these actions to prevent or treat trouble pooping (constipation) or pain when pooping: Drink enough fluid to keep your pee (urine) pale yellow. Take over-the-counter or prescription medicines. Eat foods that are high in fiber. These include beans, whole grains, and fresh fruits and vegetables. Limit foods that are high in fat and sugar. These include fried or sweet foods. Contact a doctor if: Your pain gets worse. Your pain is not controlled with medicines. Pooping causes you pain. You cannot poop after 4 days. You have pain during sex. Summary A tailbone injury is an injury on the small bone at the lower end of the backbone (spine). These injuries can be painful. Most tailbone injuries get better on their  own in 4-6 weeks. Sit on a large rubber or inflated ring or cushion to lessen pain. Do not sit for a long time without moving. Follow your doctor's suggestions to prevent or treat trouble pooping. This information is not intended to replace advice given to you by your health care provider. Make sure you discuss any questions you have with your health care provider. Document Revised: 07/01/2021 Document Reviewed: 07/01/2021 Elsevier Patient Education  2024 [employer].

## 2023-11-08 NOTE — Progress Notes (Unsigned)
 11/08/2023  Reason for Visit:  Sacral mass / pain  Requesting Provider:  Fonda Mower, NP  History of Present Illness: Anna Juarez is a 73 y.o. female presenting for evaluation of a sacral mass.  The patient has a history of chronic kidney disease, diabetes, osteoarthritis, MGUS, hypertension, hyperlipidemia, obstructive sleep apnea, atrial fibrillation, and morbid obesity.  She is on Eliquis  and is followed by the cancer center for her MGUS.  He saw Mr. Mower on 10/26/2023 for pain in the low sacrum.  She reports feeling a bump in the area but denies any specific redness, swelling, or drainage.  She reports this has been present since the early spring time.  She had an ultrasound done of this area on 10/29/2023 which did not see any masses or cysts.  She reports that it is tender to sit but otherwise denies any other symptoms from the mass.  Of note, the patient also reports that she had a fall just outside her house where she landed on her buttocks due to loss of balance when trying to squat.  This happened around the same time that she felt this bump in her sacral area.   Past Medical History: Past Medical History:  Diagnosis Date   Arthritis    Chronic kidney disease    Complication of anesthesia    Diabetes mellitus without complication (HCC)    Extreme obesity    Fatty liver    GERD (gastroesophageal reflux disease)    Hyperlipidemia    Hypertension    Palpitations    PONV (postoperative nausea and vomiting)    Radiculopathy, cervical    Renal insufficiency    stage 3   Sleep apnea    uses cpap   Tear of acetabular labrum 01/2021   right     Past Surgical History: Past Surgical History:  Procedure Laterality Date   ABDOMINAL HYSTERECTOMY     CARDIAC CATHETERIZATION     [facility]   CARDIOVERSION N/A 12/09/2022   Procedure: CARDIOVERSION;  Surgeon: Perla Evalene PARAS, MD;  Location: [facility] ORS;  Service: Cardiovascular;  Laterality: N/A;   CATARACT EXTRACTION W/PHACO Right  06/02/2021   Procedure: CATARACT EXTRACTION PHACO AND INTRAOCULAR LENS PLACEMENT (IOC) RIGHT DIABETIC;  Surgeon: Myrna Adine Anes, MD;  Location: [facility];  Service: Ophthalmology;  Laterality: Right;  2.52 0:22.8   CATARACT EXTRACTION W/PHACO Left 06/16/2021   Procedure: CATARACT EXTRACTION PHACO AND INTRAOCULAR LENS PLACEMENT (IOC) LEFT DIABETIC 3.51 00:30.9;  Surgeon: Myrna Adine Anes, MD;  Location: [facility];  Service: Ophthalmology;  Laterality: Left;   CERVICAL FUSION     x2   CHOLECYSTECTOMY     COLONOSCOPY     COLONOSCOPY WITH PROPOFOL  N/A 09/09/2021   Procedure: COLONOSCOPY WITH PROPOFOL ;  Surgeon: Jinny Carmine, MD;  Location: [facility] ENDOSCOPY;  Service: Endoscopy;  Laterality: N/A;   ESOPHAGOGASTRODUODENOSCOPY (EGD) WITH PROPOFOL  N/A 09/09/2021   Procedure: ESOPHAGOGASTRODUODENOSCOPY (EGD) WITH PROPOFOL ;  Surgeon: Jinny Carmine, MD;  Location: [facility];  Service: Endoscopy;  Laterality: N/A;   ESOPHAGOGASTRODUODENOSCOPY (EGD) WITH PROPOFOL  N/A 09/15/2022   Procedure: ESOPHAGOGASTRODUODENOSCOPY (EGD) WITH PROPOFOL ;  Surgeon: Jinny Carmine, MD;  Location: [facility];  Service: Endoscopy;  Laterality: N/A;   IR BONE MARROW BIOPSY & ASPIRATION  09/01/2023   JOINT REPLACEMENT Bilateral    knees    Home Medications: Prior to Admission medications   Medication Sig Start Date End Date Taking? Authorizing Provider  ACCU-CHEK GUIDE TEST test strip USE AS INSTRUCTED 08/16/23   Melvin Pao,  NP  acetaminophen  (TYLENOL ) 500 MG tablet Take 2 tablets (1,000 mg total) by mouth every 6 (six) hours as needed for mild pain. 02/06/21   Desiderio Schanz, MD  atorvastatin  (LIPITOR) 40 MG tablet Take 1 tablet (40 mg total) by mouth once a week. 11/26/22   Johnson, Megan P, DO  benazepril  (LOTENSIN ) 20 MG tablet Take 1 tablet (20 mg total) by mouth daily. 11/30/22   Gollan, Timothy J, MD  dapagliflozin  propanediol (FARXIGA ) 10 MG TABS tablet Take 1 tablet (10 mg total) by  mouth daily. 06/01/23   Melvin Pao, NP  ELIQUIS  5 MG TABS tablet TAKE 1 TABLET BY MOUTH TWICE A DAY 06/22/23   Gollan, Timothy J, MD  linaclotide  (LINZESS ) 145 MCG CAPS capsule Take 1 capsule (145 mcg total) by mouth daily before breakfast. 06/14/23   Melvin Pao, NP  metoprolol  succinate (TOPROL -XL) 50 MG 24 hr tablet Take 1 tablet (50 mg total) by mouth daily. TAKE WITH OR IMMEDIATELY FOLLOWING A MEAL in AM and take 25 mg ( 0.5 tablet) in the PM 11/30/22   Gollan, Timothy J, MD  nystatin  (MYCOSTATIN /NYSTOP ) powder APPLY TOPICALLY 3 TIMES A DAY 08/16/23   Melvin Pao, NP  ondansetron  (ZOFRAN -ODT) 4 MG disintegrating tablet Take 1 tablet (4 mg total) by mouth every 8 (eight) hours as needed for nausea or vomiting. 06/01/23   Melvin Pao, NP  pantoprazole  (PROTONIX ) 40 MG tablet TAKE 1 TABLET (40 MG TOTAL) BY MOUTH TWICE A DAY BEFORE MEALS 10/27/23   Melvin Pao, NP  pregabalin  (LYRICA ) 150 MG capsule TAKE 1 CAPSULE BY MOUTH THREE TIMES A DAY 09/10/23   Melvin Pao, NP  SODIUM FLUORIDE 5000 PPM 1.1 % PSTE See admin instructions. 05/14/22   [provider]  tirzepatide  (MOUNJARO ) 7.5 MG/0.5ML Pen Inject 7.5 mg into the skin once a week. 08/26/23   Melvin Pao, NP  traMADol  (ULTRAM ) 50 MG tablet Take 1 tablet (50 mg total) by mouth 2 (two) times daily. 08/26/23   Melvin Pao, NP    Allergies: Allergies  Allergen Reactions   Actos [Pioglitazone] Nausea And Vomiting   Metformin And Related Diarrhea   Scopolamine Nausea And Vomiting   Victoza [Liraglutide] Nausea And Vomiting    Social History:  reports that she has never smoked. She has never been exposed to tobacco smoke. She has never used smokeless tobacco. She reports that she does not drink alcohol and does not use drugs.   Family History: Family History  Problem Relation Age of Onset   Diabetes Mother    Heart disease Mother    Hypertension Mother    Heart disease Father    Stroke Sister  48   Cancer Brother    Hypertension Brother    Breast cancer Maternal Aunt    Breast cancer Paternal Aunt    Stroke Daughter     Review of Systems: Review of Systems  Constitutional:  Negative for chills and fever.  Respiratory:  Negative for shortness of breath.   Cardiovascular:  Negative for chest pain.  Gastrointestinal:  Negative for abdominal pain, nausea and vomiting.  Musculoskeletal:        Low sacral mass/ pain.    Physical Exam BP 119/68   Pulse 65   Temp 97.8 F (36.6 C) (Oral)   Ht 5' 2 (1.575 m)   Wt 257 lb (116.6 kg)   SpO2 98%   BMI 47.01 kg/m  CONSTITUTIONAL: No acute distress HEENT:  Normocephalic, atraumatic, extraocular motion intact. RESPIRATORY:  Normal respiratory  effort without pathologic use of accessory muscles. CARDIOVASCULAR: Regular rhythm and rate MUSCULOSKELETAL:  Normal muscle strength and tone in all four extremities.  No peripheral edema or cyanosis. SKIN: On evaluation of the skin above the gluteal cleft, there is no palpable cysts or lumps or masses at the skin level or subcutaneous level.  When palpating closer to the distal sacrum, there is a firm area consistent with the end of the sacrum bone.  However I am not able to palpate is her coccyx well and there is an abrupt end to the sacrum without the usual palpable tip of the coccyx.  At the superior aspect of the gluteal cleft, there are moisture changes and excoriation of the skin itself with some erythema but without evidence of infection. NEUROLOGIC:  Motor and sensation is grossly normal.  Cranial nerves are grossly intact. PSYCH:  Alert and oriented to person, place and time. Affect is normal.  Laboratory Analysis: No results found for this or any previous visit (from the past 24 hours).  Imaging: Ultrasound pelvis on 10/29/2023: IMPRESSION: Unremarkable sonographic appearance of the soft tissues about the sacrum. No walled off fluid collection or hematoma  visualized.  Assessment and Plan: This is a 73 y.o. female with low sacral pain.  - Discussed with patient that on exam today, I do not palpate or see any evidence of skin cyst or mass or subcutaneous mass in the sacral area or in the superior gluteal cleft at the site of her pain.  However, the palpable mass that she may be feeling I think is likely the distal end of the sacrum.  However I am not able to palpate the coccyx well given that she did fall recently and landed on her buttocks, it could be that she is feeling a coccygeal fracture.  There are no wound punctures or any evidence of complications from this.  She does have some moisture changes at the superior area of the gluteal cleft with mild skin excoriation.  I recommend that she apply barrier ointment to help with this.  To evaluate for her coccygeal area, we will also obtain a x-ray of the sacrum/coccyx to further evaluate. - At this point, no general surgery intervention is needed.  Will inform the patient of the results of her x-ray.  If there is a fracture, discussed with her that typically is managed conservatively with pain control and rest.  She could use a soft pillow so that she does not put so much pressure on that area when she sits. - Follow-up as needed.  I spent 30 minutes dedicated to the care of this patient on the date of this encounter to include pre-visit review of records, face-to-face time with the patient discussing diagnosis and management, and any post-visit coordination of care.   Aloysius Sheree Plant, MD  [facility]

## 2023-11-12 ENCOUNTER — Other Ambulatory Visit: Payer: Self-pay | Admitting: Nurse Practitioner

## 2023-11-12 DIAGNOSIS — E119 Type 2 diabetes mellitus without complications: Secondary | ICD-10-CM

## 2023-11-16 ENCOUNTER — Other Ambulatory Visit: Payer: Self-pay | Admitting: Nurse Practitioner

## 2023-11-16 NOTE — Telephone Encounter (Signed)
 Requested medication (s) are due for refill today: yes  Requested medication (s) are on the active medication list: yes  Last refill:  08/16/23  Future visit scheduled: {Yes  Notes to clinic:  unable to attach protocol, routing for review     Requested Prescriptions  Pending Prescriptions Disp Refills   ACCU-CHEK GUIDE TEST test strip [Pharmacy Med Name: ACCU-CHEK GUIDE TEST STRIP] 100 strip 0    Sig: USE AS INSTRUCTED     There is no refill protocol information for this order

## 2023-11-18 NOTE — Telephone Encounter (Signed)
 Requested medication (s) are due for refill today: yes   Requested medication (s) are on the active medication list: yes   Last refill:  08/26/23 # 60 with 1 refill   Future visit scheduled: yes   Notes to clinic:  Please review for refill. Refill not delegated per protocol.     Requested Prescriptions  Pending Prescriptions Disp Refills   traMADol  (ULTRAM ) 50 MG tablet [Pharmacy Med Name: TRAMADOL  HCL 50 MG TABLET] 60 tablet 1    Sig: TAKE 1 TABLET BY MOUTH TWICE A DAY     Not Delegated - Analgesics:  Opioid Agonists Failed - 11/18/2023  5:34 PM      Failed - This refill cannot be delegated      Failed - Urine Drug Screen completed in last 360 days      Passed - Valid encounter within last 3 months    Recent Outpatient Visits           2 months ago Diabetes mellitus treated with injections of non-insulin medication [facility])   [facility], [facility]   4 months ago DM type 2 with diabetic peripheral neuropathy [facility])   [facility] Melvin Pao, NP   4 months ago DM type 2 with diabetic peripheral neuropathy [facility])   [place] [facility] Pa[facility], NP   5 months ago DM type 2 with diabetic peripheral neuropathy [facility])   [place] [facility] Melvin Pao, NP

## 2023-11-19 NOTE — Telephone Encounter (Signed)
 Will need to wait for Karen's return on Monday if she needs it before her appt on 8/25

## 2023-11-22 DIAGNOSIS — G4733 Obstructive sleep apnea (adult) (pediatric): Secondary | ICD-10-CM | POA: Diagnosis not present

## 2023-11-29 ENCOUNTER — Ambulatory Visit
Admission: RE | Admit: 2023-11-29 | Discharge: 2023-11-29 | Disposition: A | Discharge status: Home / Self Care | Source: Ambulatory Visit | Attending: Surgery

## 2023-11-29 ENCOUNTER — Ambulatory Visit: Admitting: Nurse Practitioner

## 2023-11-29 ENCOUNTER — Encounter: Payer: Self-pay | Admitting: Nurse Practitioner

## 2023-11-29 ENCOUNTER — Ambulatory Visit
Admission: RE | Admit: 2023-11-29 | Discharge: 2023-11-29 | Disposition: A | Discharge status: Home / Self Care | Attending: Surgery | Admitting: Surgery

## 2023-11-29 VITALS — BP 100/61 | HR 66 | Temp 98.1°F | Wt 255.0 lb

## 2023-11-29 DIAGNOSIS — E1169 Type 2 diabetes mellitus with other specified complication: Secondary | ICD-10-CM

## 2023-11-29 DIAGNOSIS — E785 Hyperlipidemia, unspecified: Secondary | ICD-10-CM

## 2023-11-29 DIAGNOSIS — I878 Other specified disorders of veins: Secondary | ICD-10-CM | POA: Diagnosis not present

## 2023-11-29 DIAGNOSIS — N1832 Chronic kidney disease, stage 3b: Secondary | ICD-10-CM | POA: Diagnosis not present

## 2023-11-29 DIAGNOSIS — E119 Type 2 diabetes mellitus without complications: Secondary | ICD-10-CM

## 2023-11-29 DIAGNOSIS — Z7985 Long-term (current) use of injectable non-insulin antidiabetic drugs: Secondary | ICD-10-CM

## 2023-11-29 DIAGNOSIS — I152 Hypertension secondary to endocrine disorders: Secondary | ICD-10-CM | POA: Diagnosis not present

## 2023-11-29 DIAGNOSIS — M47816 Spondylosis without myelopathy or radiculopathy, lumbar region: Secondary | ICD-10-CM | POA: Diagnosis not present

## 2023-11-29 DIAGNOSIS — M533 Sacrococcygeal disorders, not elsewhere classified: Secondary | ICD-10-CM | POA: Diagnosis not present

## 2023-11-29 DIAGNOSIS — I4819 Other persistent atrial fibrillation: Secondary | ICD-10-CM

## 2023-11-29 DIAGNOSIS — E1159 Type 2 diabetes mellitus with other circulatory complications: Secondary | ICD-10-CM

## 2023-11-29 NOTE — Assessment & Plan Note (Signed)
 Chronic.  NSR in office today.

## 2023-11-29 NOTE — Assessment & Plan Note (Signed)
 Recommended eating smaller high protein, low fat meals more frequently and exercising 30 mins a day 5 times a week with a goal of 10-15lb weight loss in the next 3 months. Doing well with Mounjaro . Does not want increase the dose.

## 2023-11-29 NOTE — Assessment & Plan Note (Signed)
 Chronic.  Under good control on current regimen. Continue current regimen. Continue to monitor. Call with any concerns. Refills up to date. Labs drawn today.

## 2023-11-29 NOTE — Assessment & Plan Note (Signed)
 Rechecking labs today. Await results. Treat as needed.  Continue to follow up with Nephrology.  Continue with Farxiga  and ACE.  Recent note reviewed.

## 2023-11-29 NOTE — Progress Notes (Signed)
 BP 100/61   Pulse 66   Temp 98.1 F (36.7 C) (Oral)   Wt 255 lb (115.7 kg)   SpO2 97%   BMI 46.64 kg/m    Subjective:    Patient ID: Anna Juarez, female    DOB: Apr 02, 1951, 73 y.o.   MRN: [id number]  HPI: Anna Juarez is a 73 y.o. female  Chief Complaint  Patient presents with   Diabetes   DIABETES Using Farxiga  and Mounjaro .  Doing well but does have some nausea on Sunday.  Last A1c is 6.1%.  Hypoglycemic episodes:no Polydipsia/polyuria: no Visual disturbance: no Chest pain: no Paresthesias: no Glucose Monitoring: yes Taking Insulin?: no Blood Pressure Monitoring: not checking Retinal Examination: Up to Date Foot Exam: Up to Date Diabetic Education: Completed Pneumovax: up to date Influenza: will get in September Aspirin: no  HYPERTENSION / HYPERLIPIDEMIA Satisfied with current treatment? yes Duration of hypertension: chronic BP monitoring frequency: not checking BP medication side effects: no Past BP meds: benazepril , metoprolol ,  Duration of hyperlipidemia: chronic Cholesterol medication side effects: no Cholesterol supplements: none Medication compliance: excellent compliance Aspirin: no Recent stressors: no Recurrent headaches: no Visual changes: no Palpitations: no Dyspnea: no Chest pain: no Lower extremity edema: no Dizzy/lightheaded: no  CHRONIC PAIN  Doing well with her Tramadol .  Just refilled recently.  She is still having a lot of back trouble.  She is taking it twice daily.  She is not interested in any injections.  Present dose: 15 Morphine equivalents Pain control status: controlled Duration: chronic Location: back Quality: aching, sore Current Pain Level: mild Previous Pain Level: moderate Breakthrough pain: no Benefit from narcotic medications: yes What Activities task can be accomplished with current medication? Able to do her ADLs Interested in weaning off narcotics:yes   Stool softners/OTC fiber: yes  Previous pain  specialty evaluation: no Non-narcotic analgesic meds: yes Narcotic contract: yes    Relevant past medical, surgical, family and social history reviewed and updated as indicated. Interim medical history since our last visit reviewed. Allergies and medications reviewed and updated.  Review of Systems  Constitutional: Negative.   Respiratory: Negative.    Cardiovascular: Negative.   Gastrointestinal:  Positive for nausea. Negative for abdominal distention, abdominal pain, anal bleeding, blood in stool, constipation, diarrhea, rectal pain and vomiting.  Musculoskeletal: Negative.        Left arm pain  Skin: Negative.   Neurological: Negative.   Psychiatric/Behavioral: Negative.      Per HPI unless specifically indicated above     Objective:    BP 100/61   Pulse 66   Temp 98.1 F (36.7 C) (Oral)   Wt 255 lb (115.7 kg)   SpO2 97%   BMI 46.64 kg/m   Wt Readings from Last 3 Encounters:  11/29/23 255 lb (115.7 kg)  11/08/23 257 lb (116.6 kg)  10/26/23 256 lb 8 oz (116.3 kg)    Physical Exam Vitals and nursing note reviewed.  Constitutional:      General: She is not in acute distress.    Appearance: Normal appearance. She is obese. She is not ill-appearing, toxic-appearing or diaphoretic.  HENT:     Head: Normocephalic and atraumatic.     Right Ear: External ear normal.     Left Ear: External ear normal.     Nose: Nose normal.     Mouth/Throat:     Mouth: Mucous membranes are moist.     Pharynx: Oropharynx is clear.  Eyes:     General: No  scleral icterus.       Right eye: No discharge.        Left eye: No discharge.     Extraocular Movements: Extraocular movements intact.     Conjunctiva/sclera: Conjunctivae normal.     Pupils: Pupils are equal, round, and reactive to light.  Cardiovascular:     Rate and Rhythm: Normal rate and regular rhythm.     Pulses: Normal pulses.     Heart sounds: Normal heart sounds. No murmur heard.    No friction rub. No gallop.   Pulmonary:     Effort: Pulmonary effort is normal. No respiratory distress.     Breath sounds: Normal breath sounds. No stridor. No wheezing, rhonchi or rales.  Chest:     Chest wall: No tenderness.  Musculoskeletal:        General: Normal range of motion.     Cervical back: Normal range of motion and neck supple.  Skin:    General: Skin is warm and dry.     Capillary Refill: Capillary refill takes less than 2 seconds.     Coloration: Skin is not jaundiced or pale.     Findings: No bruising, erythema, lesion or rash.  Neurological:     General: No focal deficit present.     Mental Status: She is alert and oriented to person, place, and time. Mental status is at baseline.  Psychiatric:        Mood and Affect: Mood normal.        Behavior: Behavior normal.        Thought Content: Thought content normal.        Judgment: Judgment normal.     Results for orders placed or performed in visit on 10/26/23  Amylase   Collection Time: 10/26/23  2:51 PM  Result Value Ref Range   Amylase 38 28 - 100 U/L  CBC with Differential (Cancer Center Only)   Collection Time: 10/26/23  2:51 PM  Result Value Ref Range   WBC Count 7.1 4.0 - 10.5 K/uL   RBC 4.94 3.87 - 5.11 MIL/uL   Hemoglobin 14.8 12.0 - 15.0 g/dL   HCT 54.6 63.9 - 53.9 %   MCV 91.7 80.0 - 100.0 fL   MCH 30.0 26.0 - 34.0 pg   MCHC 32.7 30.0 - 36.0 g/dL   RDW 86.7 88.4 - 84.4 %   Platelet Count 169 150 - 400 K/uL   nRBC 0.0 0.0 - 0.2 %   Neutrophils Relative % 63 %   Neutro Abs 4.4 1.7 - 7.7 K/uL   Lymphocytes Relative 25 %   Lymphs Abs 1.8 0.7 - 4.0 K/uL   Monocytes Relative 10 %   Monocytes Absolute 0.7 0.1 - 1.0 K/uL   Eosinophils Relative 1 %   Eosinophils Absolute 0.1 0.0 - 0.5 K/uL   Basophils Relative 1 %   Basophils Absolute 0.1 0.0 - 0.1 K/uL   Immature Granulocytes 0 %   Abs Immature Granulocytes 0.01 0.00 - 0.07 K/uL  CMP (Cancer Center only)   Collection Time: 10/26/23  2:52 PM  Result Value Ref Range    Sodium 139 135 - 145 mmol/L   Potassium 4.8 3.5 - 5.1 mmol/L   Chloride 102 98 - 111 mmol/L   CO2 26 22 - 32 mmol/L   Glucose, Bld 108 (H) 70 - 99 mg/dL   BUN 27 (H) 8 - 23 mg/dL   Creatinine 8.45 (H) 9.55 - 1.00 mg/dL   Calcium  9.7 8.9 -  10.3 mg/dL   Total Protein 7.6 6.5 - 8.1 g/dL   Albumin 4.4 3.5 - 5.0 g/dL   AST 30 15 - 41 U/L   ALT 15 0 - 44 U/L   Alkaline Phosphatase 70 38 - 126 U/L   Total Bilirubin 1.1 0.0 - 1.2 mg/dL   GFR, Estimated 35 (L) >60 mL/min   Anion gap 11 5 - 15      Assessment & Plan:   Problem List Items Addressed This Visit       Cardiovascular and Mediastinum   Hypertension associated with diabetes (HCC)   Chronic.  Under good control on current regimen. Continue current regimen. Continue to monitor. Call with any concerns. Refills up to date. Labs drawn today.       Relevant Orders   Comprehensive metabolic panel with GFR   Atrial fibrillation (HCC)   Chronic.  NSR in office today.          Endocrine   Hyperlipidemia associated with type 2 diabetes mellitus (HCC) - Primary   Chronic.  Continue atorvastatin  weekly.  Labs ordered during visit.  Follow up in 3 months.  Call sooner if concerns arise.       Relevant Orders   Lipid panel   Diabetes mellitus treated with injections of non-insulin medication (HCC)   Chronic.  Well controlled. Continue Mounjaro  dose to 7.5mg  weekly and farxiga .  Does not want to increase the dose at this time.  Goal is to get A1c down to <7%.  Microalbumin checked today.  Labs ordered.  Follow up in 3 months.  Call sooner if concerns arise.      Relevant Orders   Hemoglobin A1c     Genitourinary   CKD (chronic kidney disease) stage 3, GFR 30-59 ml/min (HCC)   Rechecking labs today. Await results. Treat as needed.  Continue to follow up with Nephrology.  Continue with Farxiga  and ACE.  Recent note reviewed.         Other   Morbid obesity (HCC)   Recommended eating smaller high protein, low fat meals more  frequently and exercising 30 mins a day 5 times a week with a goal of 10-15lb weight loss in the next 3 months. Doing well with Mounjaro . Does not want increase the dose.          Follow up plan: Return in about 3 months (around 02/29/2024) for HTN, HLD, DM2 FU.

## 2023-11-29 NOTE — Assessment & Plan Note (Signed)
 Chronic.  Continue atorvastatin  weekly.  Labs ordered during visit.  Follow up in 3 months.  Call sooner if concerns arise.

## 2023-11-29 NOTE — Assessment & Plan Note (Signed)
 Chronic.  Well controlled. Continue Mounjaro  dose to 7.5mg  weekly and farxiga .  Does not want to increase the dose at this time.  Goal is to get A1c down to <7%.  Microalbumin checked today.  Labs ordered.  Follow up in 3 months.  Call sooner if concerns arise.

## 2023-11-30 ENCOUNTER — Ambulatory Visit: Payer: Self-pay | Admitting: Nurse Practitioner

## 2023-11-30 LAB — COMPREHENSIVE METABOLIC PANEL WITH GFR
ALT: 12 IU/L (ref 0–32)
AST: 25 IU/L (ref 0–40)
Albumin: 4.2 g/dL (ref 3.8–4.8)
Alkaline Phosphatase: 66 IU/L (ref 44–121)
BUN/Creatinine Ratio: 14 (ref 12–28)
BUN: 19 mg/dL (ref 8–27)
Bilirubin Total: 0.7 mg/dL (ref 0.0–1.2)
CO2: 26 mmol/L (ref 20–29)
Calcium: 9.7 mg/dL (ref 8.7–10.3)
Chloride: 101 mmol/L (ref 96–106)
Creatinine, Ser: 1.39 mg/dL — ABNORMAL HIGH (ref 0.57–1.00)
Globulin, Total: 2.1 g/dL (ref 1.5–4.5)
Glucose: 109 mg/dL — ABNORMAL HIGH (ref 70–99)
Potassium: 4.1 mmol/L (ref 3.5–5.2)
Sodium: 141 mmol/L (ref 134–144)
Total Protein: 6.3 g/dL (ref 6.0–8.5)
eGFR: 40 mL/min/1.73 — ABNORMAL LOW (ref >59)

## 2023-11-30 LAB — HEMOGLOBIN A1C
Est. average glucose Bld gHb Est-mCnc: 123 mg/dL
Hgb A1c MFr Bld: 5.9 % — ABNORMAL HIGH (ref 4.8–5.6)

## 2023-11-30 LAB — LIPID PANEL
Chol/HDL Ratio: 2.8 ratio (ref 0.0–4.4)
Cholesterol, Total: 122 mg/dL (ref 100–199)
HDL: 43 mg/dL (ref >39)
LDL Chol Calc (NIH): 57 mg/dL (ref 0–99)
Triglycerides: 126 mg/dL (ref 0–149)
VLDL Cholesterol Cal: 22 mg/dL (ref 5–40)

## 2023-12-09 DIAGNOSIS — E1122 Type 2 diabetes mellitus with diabetic chronic kidney disease: Secondary | ICD-10-CM | POA: Diagnosis not present

## 2023-12-09 DIAGNOSIS — N1832 Chronic kidney disease, stage 3b: Secondary | ICD-10-CM | POA: Diagnosis not present

## 2023-12-09 DIAGNOSIS — I10 Essential (primary) hypertension: Secondary | ICD-10-CM | POA: Diagnosis not present

## 2023-12-10 ENCOUNTER — Telehealth: Payer: Self-pay

## 2023-12-10 NOTE — Telephone Encounter (Signed)
-----   Message from [facility] sent at 12/10/2023  8:25 AM EDT ----- Regarding: call with results Good morning,  I saw this patient a month ago for possible sacral mass.  On exam, there were no masses, cysts, or anything like that, but she was tender at the coccyx area.  Xray of that area was done, and the report just came back negative for any fractures.  There's some underlying arthritis changes but no acute pathology.  Would you mind calling the patient to let her know the results?  No follow up is needed with us .  Thanks,  [employer]

## 2023-12-10 NOTE — Telephone Encounter (Signed)
 Message left for the patient notifying her of the results. She may call us  back with any further questions.

## 2023-12-13 ENCOUNTER — Telehealth: Payer: Self-pay

## 2023-12-13 MED ORDER — LINACLOTIDE 290 MCG PO CAPS: 290.0000 ug | ORAL_CAPSULE | Freq: Every day | ORAL | 1 refills | Status: DC

## 2023-12-13 NOTE — Telephone Encounter (Signed)
 Copied from CRM [id number]. Topic: Clinical - Medication Question >> Dec 13, 2023 10:32 AM Carlatta H wrote: Reason for CRM: Patient is requesting that her  linaclotide  (LINZESS ) 145 MCG CAPS capsule [[id number]] is increased due to constipation

## 2023-12-13 NOTE — Telephone Encounter (Signed)
 Called and notified patient that the medication was sent in for her.

## 2023-12-13 NOTE — Telephone Encounter (Signed)
 Routing to provider to advise. Can medication be increased?

## 2023-12-13 NOTE — Telephone Encounter (Signed)
 Linzess increased to 290 mcg.

## 2023-12-16 ENCOUNTER — Other Ambulatory Visit: Payer: Self-pay | Admitting: Cardiovascular Disease

## 2023-12-16 DIAGNOSIS — I10 Essential (primary) hypertension: Secondary | ICD-10-CM

## 2023-12-16 DIAGNOSIS — R600 Localized edema: Secondary | ICD-10-CM | POA: Diagnosis not present

## 2023-12-16 DIAGNOSIS — N1832 Chronic kidney disease, stage 3b: Secondary | ICD-10-CM | POA: Diagnosis not present

## 2023-12-16 DIAGNOSIS — E1122 Type 2 diabetes mellitus with diabetic chronic kidney disease: Secondary | ICD-10-CM | POA: Diagnosis not present

## 2023-12-23 DIAGNOSIS — G4733 Obstructive sleep apnea (adult) (pediatric): Secondary | ICD-10-CM | POA: Diagnosis not present

## 2023-12-29 ENCOUNTER — Other Ambulatory Visit: Payer: Self-pay | Admitting: Nurse Practitioner

## 2023-12-31 NOTE — Telephone Encounter (Signed)
 Requested Prescriptions  Pending Prescriptions Disp Refills   FARXIGA  10 MG TABS tablet [Pharmacy Med Name: FARXIGA  10 MG TABLET] 90 tablet 1    Sig: TAKE 1 TABLET BY MOUTH EVERY DAY     Endocrinology:  Diabetes - SGLT2 Inhibitors Failed - 12/31/2023  9:52 AM      Failed - Cr in normal range and within 360 days    Creatinine  Date Value Ref Range Status  10/26/2023 1.54 (H) 0.44 - 1.00 mg/dL Final   Creatinine, Ser  Date Value Ref Range Status  11/29/2023 1.39 (H) 0.57 - 1.00 mg/dL Final         Failed - eGFR in normal range and within 360 days    GFR calc Af Amer  Date Value Ref Range Status  05/17/2020 55 (L) >59 mL/min/1.73 Final    Comment:    **In accordance with recommendations from the NKF-ASN Task force,**   [facility] is in the process of updating its eGFR calculation to the   2021 CKD-EPI creatinine equation that estimates kidney function   without a race variable.    GFR, Estimated  Date Value Ref Range Status  10/26/2023 35 (L) >60 mL/min Final    Comment:    (NOTE) Calculated using the CKD-EPI Creatinine Equation (2021)    eGFR  Date Value Ref Range Status  11/29/2023 40 (L) >59 mL/min/1.73 Final         Passed - HBA1C is between 0 and 7.9 and within 180 days    Hemoglobin A1C  Date Value Ref Range Status  09/13/2018 8.0  Final   HB A1C (BAYER DCA - WAIVED)  Date Value Ref Range Status  11/26/2022 7.2 (H) 4.8 - 5.6 % Final    Comment:             Prediabetes: 5.7 - 6.4          Diabetes: >6.4          Glycemic control for adults with diabetes: <7.0    Hgb A1c MFr Bld  Date Value Ref Range Status  11/29/2023 5.9 (H) 4.8 - 5.6 % Final    Comment:             Prediabetes: 5.7 - 6.4          Diabetes: >6.4          Glycemic control for adults with diabetes: <7.0          Passed - Valid encounter within last 6 months    Recent Outpatient Visits           1 month ago Hyperlipidemia associated with type 2 diabetes mellitus (HCC)   [place]  [facility] Melvin Pao, NP   4 months ago Diabetes mellitus treated with injections of non-insulin medication [facility])   [place] [facility] Mel[facility] Pa[facility], [facility]   5 months ago DM type 2 with diabetic peripheral neuropathy [facility])   [place] [facility] Melvin Pao, NP   6 months ago DM type 2 with diabetic peripheral neuropathy [facility])   [place] [facility] Melvin Pao, NP   7 months ago DM type 2 with diabetic peripheral neuropathy [facility])   [place] [facility] Melvin Pao, NP

## 2024-01-08 ENCOUNTER — Other Ambulatory Visit: Payer: Self-pay | Admitting: Cardiovascular Disease

## 2024-01-08 DIAGNOSIS — I10 Essential (primary) hypertension: Secondary | ICD-10-CM

## 2024-01-17 ENCOUNTER — Other Ambulatory Visit: Payer: Self-pay | Admitting: Nurse Practitioner

## 2024-01-18 NOTE — Telephone Encounter (Signed)
 Requested medications are due for refill today.  yes  Requested medications are on the active medications list.  yes  Last refill. 11/22/2023 #60 1 rf  Future visit scheduled.   yes  Notes to clinic.  Refill not delegated.    Requested Prescriptions  Pending Prescriptions Disp Refills   traMADol  (ULTRAM ) 50 MG tablet [Pharmacy Med Name: TRAMADOL  HCL 50 MG TABLET] 60 tablet 1    Sig: TAKE 1 TABLET BY MOUTH TWICE A DAY     Not Delegated - Analgesics:  Opioid Agonists Failed - 01/18/2024  5:41 PM      Failed - This refill cannot be delegated      Failed - Urine Drug Screen completed in last 360 days      Passed - Valid encounter within last 3 months    Recent Outpatient Visits           1 month ago Hyperlipidemia associated with type 2 diabetes mellitus (HCC)   [place] [facility] Melvin Pao, NP   4 months ago Diabetes mellitus treated with injections of non-insulin medication [facility])   [place] [facility], Pao, NP   6 months ago DM type 2 with diabetic peripheral neuropathy [facility])   [place] [facility] Pao, NP   6 months ago DM type 2 with diabetic peripheral neuropathy [facility])   [place] [facility] Melvin Pao, NP   7 months ago DM type 2 with diabetic peripheral neuropathy [facility])   [place] [facility], Pao, NP               nystatin  (MYCOSTATIN /NYSTOP ) powder [Pharmacy Med Name: NYSTATIN  100,000 UNIT/GM POWD] 60 g 1    Sig: APPLY TOPICALLY 3 TIMES A DAY     Off-Protocol Failed - 01/18/2024  5:41 PM      Failed - Medication not assigned to a protocol, review manually.      Passed - Valid encounter within last 12 months    Recent Outpatient Visits           1 month ago Hyperlipidemia associated with type 2 diabetes mellitus [facility])   [place] [facility] Melvin Pao, NP   4 months ago Diabetes mellitus treated  with injections of non-insulin medication [facility])   [place] [facility] Mel[facility] Pao, NP   6 months ago DM type 2 with diabetic peripheral neuropathy [facility])   [place] [facility] Melvin Pao, NP   6 months ago DM type 2 with diabetic peripheral neuropathy [facility])   [place] [facility] Mel[facility] Pao, NP   7 months ago DM type 2 with diabetic peripheral neuropathy [facility])   [place] [facility] Melvin Pao, NP

## 2024-01-22 ENCOUNTER — Other Ambulatory Visit: Payer: Self-pay | Admitting: Nurse Practitioner

## 2024-01-24 NOTE — Telephone Encounter (Signed)
 Requested Prescriptions  Pending Prescriptions Disp Refills   pantoprazole  (PROTONIX ) 40 MG tablet [Pharmacy Med Name: PANTOPRAZOLE  SOD DR 40 MG TAB] 180 tablet 0    Sig: TAKE 1 TABLET (40 MG TOTAL) BY MOUTH TWICE A DAY BEFORE MEALS     Gastroenterology: Proton Pump Inhibitors Passed - 01/24/2024  4:25 PM      Passed - Valid encounter within last 12 months    Recent Outpatient Visits           1 month ago Hyperlipidemia associated with type 2 diabetes mellitus (HCC)   [place] [facility] Mel[facility] Pao, NP   5 months ago Diabetes mellitus treated with injections of non-insulin medication [facility])   [place] [facility] Melvin Pao, NP   6 months ago DM type 2 with diabetic peripheral neuropathy [facility])   [place] [facility] Melvin Pao, NP   6 months ago DM type 2 with diabetic peripheral neuropathy [facility])   [place] [facility] Mel[facility] Pao, NP   7 months ago DM type 2 with diabetic peripheral neuropathy [facility])   [place] [facility] Melvin Pao, NP

## 2024-01-28 ENCOUNTER — Ambulatory Visit: Payer: Self-pay

## 2024-01-28 DIAGNOSIS — R21 Rash and other nonspecific skin eruption: Secondary | ICD-10-CM | POA: Diagnosis not present

## 2024-01-28 DIAGNOSIS — L03115 Cellulitis of right lower limb: Secondary | ICD-10-CM | POA: Diagnosis not present

## 2024-01-28 NOTE — Telephone Encounter (Signed)
 FYI Only or Action Required?: FYI only for provider.  Patient was last seen in primary care on 11/29/2023 by Melvin Pao, NP.  Called Nurse Triage reporting Leg Swelling.  Symptoms began yesterday.  Interventions attempted: Nothing.  Symptoms are: gradually worsening.  Triage Disposition: See HCP Within 4 Hours (Or PCP Triage)  Patient/caregiver understands and will follow disposition?: No appointment, going to urgent care     Copied from CRM #[id number]. Topic: Clinical - Red Word Triage >> Jan 28, 2024 11:02 AM Deaijah H wrote: Red Word that prompted transfer to Nurse Triage: Right leg started swelling/hurting and feels real hot.      Reason for Disposition  [1] Thigh, calf, or ankle swelling AND [2] only 1 side  Answer Assessment - Initial Assessment Questions 1. ONSET: When did the swelling start? (e.g., minutes, hours, days)     Yesterday 2. LOCATION: What part of the leg is swollen?  Are both legs swollen or just one leg?     Right leg  3. SEVERITY: How bad is the swelling? (e.g., localized; mild, moderate, severe)     Knee to ankle  4. REDNESS: Is there redness or signs of infection?     Red dots  5. PAIN: Is the swelling painful to touch? If Yes, ask: How painful is it?   (Scale 1-10; mild, moderate or severe)     5/10, worse with palpation  6. FEVER: Do you have a fever? If Yes, ask: What is it, how was it measured, and when did it start?      No  7. CAUSE: What do you think is causing the leg swelling?     Unsure  8. MEDICAL HISTORY: Do you have a history of blood clots (e.g., DVT), cancer, heart failure, kidney disease, or liver failure?     Kidney disease  9. RECURRENT SYMPTOM: Have you had leg swelling before? If Yes, ask: When was the last time? What happened that time?     No 10. OTHER SYMPTOMS: Do you have any other symptoms? (e.g., chest pain, difficulty breathing)       No  Protocols used: Leg Swelling and  Edema-A-AH

## 2024-02-01 ENCOUNTER — Other Ambulatory Visit: Payer: Self-pay | Admitting: Nurse Practitioner

## 2024-02-03 NOTE — Telephone Encounter (Signed)
 Requested medications are due for refill today.  yes  Requested medications are on the active medications list.  yes  Last refill. 06/01/2023 #20 0 rf  Future visit scheduled.   yes  Notes to clinic.  Refill not delegated    Requested Prescriptions  Pending Prescriptions Disp Refills   ondansetron  (ZOFRAN -ODT) 4 MG disintegrating tablet [Pharmacy Med Name: ONDANSETRON  ODT 4 MG TABLET] 20 tablet 0    Sig: TAKE 1 TABLET BY MOUTH EVERY 8 HOURS AS NEEDED FOR NAUSEA AND VOMITING     Not Delegated - Gastroenterology: Antiemetics - ondansetron  Failed - 02/03/2024  1:13 PM      Failed - This refill cannot be delegated      Passed - AST in normal range and within 360 days    AST  Date Value Ref Range Status  11/29/2023 25 0 - 40 IU/L Final  10/26/2023 30 15 - 41 U/L Final         Passed - ALT in normal range and within 360 days    ALT  Date Value Ref Range Status  11/29/2023 12 0 - 32 IU/L Final  10/26/2023 15 0 - 44 U/L Final         Passed - Valid encounter within last 6 months    Recent Outpatient Visits           2 months ago Hyperlipidemia associated with type 2 diabetes mellitus (HCC)   [place] [facility], NP   5 months ago Diabetes mellitus treated with injections of non-insulin medication [facility])   [place]   6 months ago DM type 2 with diabetic peripheral neuropathy [facility])   [place] [facility] Pao, NP   7 months ago DM type 2 with diabetic peripheral neuropathy [facility])   [place] [facility] Melvin Pao, NP   8 months ago DM type 2 with diabetic peripheral neuropathy [facility])   [place] [facility] Melvin Pao, NP

## 2024-02-04 ENCOUNTER — Other Ambulatory Visit: Payer: Self-pay | Admitting: Nurse Practitioner

## 2024-02-05 NOTE — Telephone Encounter (Signed)
 Requested medication (s) are due for refill today: yes  Requested medication (s) are on the active medication list: yes  Last refill:  08/26/23  Future visit scheduled: yes  Notes to clinic:  Medication not assigned to a protocol, review manually.      Requested Prescriptions  Pending Prescriptions Disp Refills   MOUNJARO  7.5 MG/0.5ML Pen [Pharmacy Med Name: MOUNJARO  7.5 MG/0.5 ML PEN]  1    Sig: INJECT 7.5 MG SUBCUTANEOUSLY WEEKLY     Off-Protocol Failed - 02/05/2024  8:43 AM      Failed - Medication not assigned to a protocol, review manually.      Passed - Valid encounter within last 12 months    Recent Outpatient Visits           2 months ago Hyperlipidemia associated with type 2 diabetes mellitus [facility])   [facility] Pa[facility], NP   5 months ago Diabetes mellitus treated with injections of non-insulin medication [facility])   [place] [facility] Pao, NP   6 months ago DM type 2 with diabetic peripheral neuropathy [facility])   [place] [facility] Pao, NP   7 months ago DM type 2 with diabetic peripheral neuropathy [facility])   [place] [facility] Melvin Pao, NP   8 months ago DM type 2 with diabetic peripheral neuropathy [place])   [place] [facility] Melvin Pao, NP

## 2024-03-06 ENCOUNTER — Encounter: Payer: Self-pay | Admitting: Nurse Practitioner

## 2024-03-06 ENCOUNTER — Ambulatory Visit: Admitting: Nurse Practitioner

## 2024-03-06 DIAGNOSIS — I152 Hypertension secondary to endocrine disorders: Secondary | ICD-10-CM | POA: Diagnosis not present

## 2024-03-06 DIAGNOSIS — N1832 Chronic kidney disease, stage 3b: Secondary | ICD-10-CM

## 2024-03-06 DIAGNOSIS — E1169 Type 2 diabetes mellitus with other specified complication: Secondary | ICD-10-CM

## 2024-03-06 DIAGNOSIS — Z0000 Encounter for general adult medical examination without abnormal findings: Secondary | ICD-10-CM

## 2024-03-06 DIAGNOSIS — E1142 Type 2 diabetes mellitus with diabetic polyneuropathy: Secondary | ICD-10-CM

## 2024-03-06 DIAGNOSIS — E785 Hyperlipidemia, unspecified: Secondary | ICD-10-CM | POA: Diagnosis not present

## 2024-03-06 DIAGNOSIS — I4819 Other persistent atrial fibrillation: Secondary | ICD-10-CM

## 2024-03-06 DIAGNOSIS — E1159 Type 2 diabetes mellitus with other circulatory complications: Secondary | ICD-10-CM | POA: Diagnosis not present

## 2024-03-06 DIAGNOSIS — E119 Type 2 diabetes mellitus without complications: Secondary | ICD-10-CM

## 2024-03-06 MED ORDER — DAPAGLIFLOZIN PROPANEDIOL 10 MG PO TABS: 10.0000 mg | ORAL_TABLET | Freq: Every day | ORAL | 1 refills | Status: AC

## 2024-03-06 MED ORDER — TIRZEPATIDE 10 MG/0.5ML ~~LOC~~ SOAJ: 10.0000 mg | SUBCUTANEOUS | 1 refills | Status: AC

## 2024-03-06 MED ORDER — LUBIPROSTONE 24 MCG PO CAPS: 24.0000 ug | ORAL_CAPSULE | Freq: Two times a day (BID) | ORAL | 1 refills | Status: DC

## 2024-03-06 MED ORDER — ONDANSETRON 4 MG PO TBDP: 4.0000 mg | ORAL_TABLET | Freq: Three times a day (TID) | ORAL | 1 refills | Status: AC | PRN

## 2024-03-06 MED ORDER — PREGABALIN 150 MG PO CAPS: 150.0000 mg | ORAL_CAPSULE | Freq: Two times a day (BID) | ORAL | 1 refills | Status: AC

## 2024-03-06 MED ORDER — ATORVASTATIN CALCIUM 40 MG PO TABS: 40.0000 mg | ORAL_TABLET | ORAL | 0 refills | Status: AC

## 2024-03-06 MED ORDER — PANTOPRAZOLE SODIUM 40 MG PO TBEC: 40.0000 mg | DELAYED_RELEASE_TABLET | Freq: Every day | ORAL | 1 refills | Status: AC

## 2024-03-06 MED ORDER — TRAMADOL HCL ER 100 MG PO TB24: 100.0000 mg | ORAL_TABLET | Freq: Every day | ORAL | 2 refills | Status: AC

## 2024-03-06 MED ORDER — NYSTATIN 100000 UNIT/GM EX POWD: Freq: Two times a day (BID) | CUTANEOUS | 1 refills | Status: AC

## 2024-03-06 NOTE — Assessment & Plan Note (Signed)
 Rechecking labs today. Await results. Treat as needed.  Continue to follow up with Nephrology.  Continue with Farxiga  and ACE.  Recent note reviewed.

## 2024-03-06 NOTE — Assessment & Plan Note (Signed)
 Chronic.  NSR in office today.  Followed by Dr. Gollan.  Continue with Eliquis .

## 2024-03-06 NOTE — Progress Notes (Signed)
 BP 114/71 (BP Location: Left Arm, Patient Position: Sitting)   Pulse 61   Temp (!) 97.5 F (36.4 C) (Oral)   Ht 5' 2.01 (1.575 m)   Wt 243 lb (110.2 kg)   SpO2 96%   BMI 44.43 kg/m    Subjective:    Patient ID: Anna Juarez, female    DOB: 02-Oct-1950, 73 y.o.   MRN: [id number]  HPI: Anna Juarez is a 73 y.o. female  Chief Complaint  Patient presents with   Hypertension   Hyperlipidemia   DIABETES Using Farxiga  and Mounjaro .  Doing well but does have some nausea on Sunday.  Last A1c is 5.9%. She has lost 50lbs since starting Mounjaro .  She is tolerating it well but feels like she is hungry.  Hypoglycemic episodes:no Polydipsia/polyuria: no Visual disturbance: no Chest pain: no Paresthesias: no Glucose Monitoring: yes Taking Insulin?: no Blood Pressure Monitoring: not checking Retinal Examination: Up to Date Foot Exam: Up to Date Diabetic Education: Completed Pneumovax: up to date Influenza: will get in September Aspirin: no  HYPERTENSION / HYPERLIPIDEMIA Satisfied with current treatment? yes Duration of hypertension: chronic BP monitoring frequency: not checking BP medication side effects: no Past BP meds: benazepril , metoprolol ,  Duration of hyperlipidemia: chronic Cholesterol medication side effects: no Cholesterol supplements: none Medication compliance: excellent compliance Aspirin: no Recent stressors: no Recurrent headaches: no Visual changes: no Palpitations: no Dyspnea: no Chest pain: no Lower extremity edema: no Dizzy/lightheaded: no  CHRONIC PAIN  She doesn't feel like the Tramadol  is helping much.  It eases then pain but not enough to help her with ADLs.  She is still having a lot of back trouble.  She is taking it twice daily.  She is not interested in any injections.  Present dose: 40 Morphine equivalents Pain control status: controlled Duration: chronic Location: back Quality: aching, sore Current Pain Level: mild Previous Pain  Level: moderate Breakthrough pain: no Benefit from narcotic medications: yes What Activities task can be accomplished with current medication? Able to do her ADLs Interested in weaning off narcotics:yes   Stool softners/OTC fiber: yes  Previous pain specialty evaluation: no Non-narcotic analgesic meds: yes Narcotic contract: yes  CONSTIPATION She continues to struggle with constipation.  She goes once every 4 days.  Sometimes has to take two linzess  to help her go.  Relevant past medical, surgical, family and social history reviewed and updated as indicated. Interim medical history since our last visit reviewed. Allergies and medications reviewed and updated.  Review of Systems  Constitutional: Negative.   Respiratory: Negative.    Cardiovascular: Negative.   Gastrointestinal:  Positive for constipation and nausea. Negative for abdominal distention, abdominal pain, anal bleeding, blood in stool, diarrhea, rectal pain and vomiting.  Musculoskeletal:  Positive for back pain.  Skin: Negative.   Neurological: Negative.   Psychiatric/Behavioral: Negative.      Per HPI unless specifically indicated above     Objective:    BP 114/71 (BP Location: Left Arm, Patient Position: Sitting)   Pulse 61   Temp (!) 97.5 F (36.4 C) (Oral)   Ht 5' 2.01 (1.575 m)   Wt 243 lb (110.2 kg)   SpO2 96%   BMI 44.43 kg/m   Wt Readings from Last 3 Encounters:  03/06/24 243 lb (110.2 kg)  11/29/23 255 lb (115.7 kg)  11/08/23 257 lb (116.6 kg)    Physical Exam Vitals and nursing note reviewed.  Constitutional:      General: She is not in acute  distress.    Appearance: Normal appearance. She is obese. She is not ill-appearing, toxic-appearing or diaphoretic.  HENT:     Head: Normocephalic and atraumatic.     Right Ear: External ear normal.     Left Ear: External ear normal.     Nose: Nose normal.     Mouth/Throat:     Mouth: Mucous membranes are moist.     Pharynx: Oropharynx is clear.   Eyes:     General: No scleral icterus.       Right eye: No discharge.        Left eye: No discharge.     Extraocular Movements: Extraocular movements intact.     Conjunctiva/sclera: Conjunctivae normal.     Pupils: Pupils are equal, round, and reactive to light.  Cardiovascular:     Rate and Rhythm: Normal rate and regular rhythm.     Pulses: Normal pulses.     Heart sounds: Normal heart sounds. No murmur heard.    No friction rub. No gallop.  Pulmonary:     Effort: Pulmonary effort is normal. No respiratory distress.     Breath sounds: Normal breath sounds. No stridor. No wheezing, rhonchi or rales.  Chest:     Chest wall: No tenderness.  Musculoskeletal:        General: Normal range of motion.     Cervical back: Normal range of motion and neck supple.  Skin:    General: Skin is warm and dry.     Capillary Refill: Capillary refill takes less than 2 seconds.     Coloration: Skin is not jaundiced or pale.     Findings: No bruising, erythema, lesion or rash.  Neurological:     General: No focal deficit present.     Mental Status: She is alert and oriented to person, place, and time. Mental status is at baseline.  Psychiatric:        Mood and Affect: Mood normal.        Behavior: Behavior normal.        Thought Content: Thought content normal.        Judgment: Judgment normal.     Results for orders placed or performed in visit on 11/29/23  Comprehensive metabolic panel with GFR   Collection Time: 11/29/23  1:49 PM  Result Value Ref Range   Glucose 109 (H) 70 - 99 mg/dL   BUN 19 8 - 27 mg/dL   Creatinine, Ser 8.60 (H) 0.57 - 1.00 mg/dL   eGFR 40 (L) >40 fO/fpw/8.26   BUN/Creatinine Ratio 14 12 - 28   Sodium 141 134 - 144 mmol/L   Potassium 4.1 3.5 - 5.2 mmol/L   Chloride 101 96 - 106 mmol/L   CO2 26 20 - 29 mmol/L   Calcium  9.7 8.7 - 10.3 mg/dL   Total Protein 6.3 6.0 - 8.5 g/dL   Albumin 4.2 3.8 - 4.8 g/dL   Globulin, Total 2.1 1.5 - 4.5 g/dL   Bilirubin Total 0.7  0.0 - 1.2 mg/dL   Alkaline Phosphatase 66 44 - 121 IU/L   AST 25 0 - 40 IU/L   ALT 12 0 - 32 IU/L  Hemoglobin A1c   Collection Time: 11/29/23  1:49 PM  Result Value Ref Range   Hgb A1c MFr Bld 5.9 (H) 4.8 - 5.6 %   Est. average glucose Bld gHb Est-mCnc 123 mg/dL  Lipid panel   Collection Time: 11/29/23  1:49 PM  Result Value Ref Range   Cholesterol,  Total 122 100 - 199 mg/dL   Triglycerides 873 0 - 149 mg/dL   HDL 43 >60 mg/dL   VLDL Cholesterol Cal 22 5 - 40 mg/dL   LDL Chol Calc (NIH) 57 0 - 99 mg/dL   Chol/HDL Ratio 2.8 0.0 - 4.4 ratio      Assessment & Plan:   Problem List Items Addressed This Visit       Cardiovascular and Mediastinum   Hypertension associated with diabetes (HCC)   Chronic.  Under good control on current regimen. Continue current regimen. Continue to monitor. Call with any concerns. Refills up to date. Labs drawn today.       Relevant Medications   atorvastatin  (LIPITOR) 40 MG tablet   dapagliflozin  propanediol (FARXIGA ) 10 MG TABS tablet   pregabalin  (LYRICA ) 150 MG capsule   tirzepatide  (MOUNJARO ) 10 MG/0.5ML Pen   Other Relevant Orders   Comprehensive metabolic panel with GFR   Atrial fibrillation (HCC)   Chronic.  NSR in office today.  Followed by Dr. Gollan.  Continue with Eliquis .        Relevant Medications   atorvastatin  (LIPITOR) 40 MG tablet     Endocrine   DM type 2 with diabetic peripheral neuropathy (HCC)   Chronic.  Doing well with medication.  Having some constipation.  Patient has been on Linzess  but no longer working.  Will change to Amitiza  due to also being on Tramadol .  Tolerating Mounjaro  7.5mg  well.  Will increase to 10mg  to help with weight loss.  Continue with current dose of medication.  Will follow up in 6 weeks to reevaluation A1c.       Relevant Medications   atorvastatin  (LIPITOR) 40 MG tablet   dapagliflozin  propanediol (FARXIGA ) 10 MG TABS tablet   pregabalin  (LYRICA ) 150 MG capsule   tirzepatide  (MOUNJARO ) 10  MG/0.5ML Pen   Other Relevant Orders   Hemoglobin A1c   Hyperlipidemia associated with type 2 diabetes mellitus (HCC)   Chronic.  Continue atorvastatin  weekly.  Tolerates statin well when taking it weekly.  Labs ordered during visit.  Follow up in 3 months.  Call sooner if concerns arise.       Relevant Medications   atorvastatin  (LIPITOR) 40 MG tablet   dapagliflozin  propanediol (FARXIGA ) 10 MG TABS tablet   tirzepatide  (MOUNJARO ) 10 MG/0.5ML Pen   Other Relevant Orders   Lipid panel   Diabetes mellitus treated with injections of non-insulin medication (HCC)   Chronic.  Well controlled. Continue with Farxiga .  Mounjaro  dose increased to 10mg  to help with weight loss. Patient has lost 50lbs since February.  Last A1c was 5.9%.   Microalbumin up to date. Labs ordered.  Follow up in 3 months.  Call sooner if concerns arise.      Relevant Medications   atorvastatin  (LIPITOR) 40 MG tablet   dapagliflozin  propanediol (FARXIGA ) 10 MG TABS tablet   tirzepatide  (MOUNJARO ) 10 MG/0.5ML Pen     Genitourinary   CKD (chronic kidney disease) stage 3, GFR 30-59 ml/min (HCC)   Rechecking labs today. Await results. Treat as needed.  Continue to follow up with Nephrology.  Continue with Farxiga  and ACE.  Recent note reviewed.         Other   Morbid obesity (HCC) - Primary   Recommended eating smaller high protein, low fat meals more frequently and exercising 30 mins a day 5 times a week with a goal of 10-15lb weight loss in the next 3 months. Has lost 50lbs on Mounjaro .  Will  increase dose to 10mg  weekly.       Relevant Medications   dapagliflozin  propanediol (FARXIGA ) 10 MG TABS tablet   tirzepatide  (MOUNJARO ) 10 MG/0.5ML Pen   Other Visit Diagnoses       Encounter for Medicare annual wellness exam             Follow up plan: Return in about 3 months (around 06/04/2024) for HTN, HLD, DM2 FU.

## 2024-03-06 NOTE — Assessment & Plan Note (Signed)
 Recommended eating smaller high protein, low fat meals more frequently and exercising 30 mins a day 5 times a week with a goal of 10-15lb weight loss in the next 3 months. Has lost 50lbs on Mounjaro .  Will increase dose to 10mg  weekly.

## 2024-03-06 NOTE — Assessment & Plan Note (Signed)
 Chronic.  Well controlled. Continue with Farxiga .  Mounjaro  dose increased to 10mg  to help with weight loss. Patient has lost 50lbs since February.  Last A1c was 5.9%.   Microalbumin up to date. Labs ordered.  Follow up in 3 months.  Call sooner if concerns arise.

## 2024-03-06 NOTE — Assessment & Plan Note (Signed)
 Chronic.  Under good control on current regimen. Continue current regimen. Continue to monitor. Call with any concerns. Refills up to date. Labs drawn today.

## 2024-03-06 NOTE — Assessment & Plan Note (Signed)
 Chronic.  Doing well with medication.  Having some constipation.  Patient has been on Linzess  but no longer working.  Will change to Amitiza  due to also being on Tramadol .  Tolerating Mounjaro  7.5mg  well.  Will increase to 10mg  to help with weight loss.  Continue with current dose of medication.  Will follow up in 6 weeks to reevaluation A1c.

## 2024-03-06 NOTE — Assessment & Plan Note (Signed)
 Chronic.  Continue atorvastatin  weekly.  Tolerates statin well when taking it weekly.  Labs ordered during visit.  Follow up in 3 months.  Call sooner if concerns arise.

## 2024-03-06 NOTE — Progress Notes (Signed)
 Chief Complaint  Patient presents with   Hypertension   Hyperlipidemia     Subjective:   Anna Juarez is a 73 y.o. female who presents for a Medicare Annual Wellness Visit.  Visit info / Clinical Intake: Medicare Wellness Visit Type:: Subsequent Annual Wellness Visit Persons participating in visit and providing information:: patient Medicare Wellness Visit Mode:: In-person (required for WTM) Interpreter Needed?: No Pre-visit prep was completed: yes AWV questionnaire completed by patient prior to visit?: no Living arrangements:: (!) lives alone Patient's Overall Health Status Rating: (!) fair Typical amount of pain: some Does pain affect daily life?: (!) yes (Some) Are you currently prescribed opioids?: no  Dietary Habits and Nutritional Risks How many meals a day?: 2 Eats fruit and vegetables daily?: yes Most meals are obtained by: preparing own meals; eating out In the last 2 weeks, have you had any of the following?: none Diabetic:: (!) yes Any non-healing wounds?: no How often do you check your BS?: as needed Would you like to be referred to a Nutritionist or for Diabetic Management? : no  Functional Status Activities of Daily Living (to include ambulation/medication): Independent Ambulation: Independent Medication Administration: Independent Home Management: Independent Manage your own finances?: yes Primary transportation is: driving Concerns about vision?: (!) yes (wears glasses) Concerns about hearing?: no  Fall Screening Falls in the past year?: 0 Number of falls in past year: 0 Was there an injury with Fall?: 0 Fall Risk Category Calculator: 0 Patient Fall Risk Level: Low Fall Risk  Fall Risk Patient at Risk for Falls Due to: No Fall Risks Fall risk Follow up: Falls evaluation completed  Home and Transportation Safety: All rugs have non-skid backing?: N/A, no rugs All stairs or steps have railings?: N/A, no stairs Grab bars in the bathtub or  shower?: yes Have non-skid surface in bathtub or shower?: yes Good home lighting?: yes Regular seat belt use?: yes Hospital stays in the last year:: no  Cognitive Assessment Difficulty concentrating, remembering, or making decisions? : yes (Sometimes trouble concentrating) Will 6CIT or Mini Cog be Completed: yes What year is it?: 0 points What month is it?: 0 points Give patient an address phrase to remember (5 components): [address] [place] About what time is it?: 0 points Count backwards from 20 to 1: 0 points Say the months of the year in reverse: 2 points Repeat the address phrase from earlier: 2 points 6 CIT Score: 4 points  Advance Directives (For Healthcare) Does Patient Have a Medical Advance Directive?: Yes Does patient want to make changes to medical advance directive?: No - Patient declined Type of Advance Directive: Healthcare Power of [place]; Living will Copy of Healthcare Power of Attorney in Chart?: No - copy requested Would patient like information on creating a medical advance directive?: No - Patient declined  Reviewed/Updated  Reviewed/Updated: Reviewed All (Medical, Surgical, Family, Medications, Allergies, Care Teams, Patient Goals); Medical History; Surgical History; Family History; Medications; Allergies; Care Teams; Patient Goals    Allergies (verified) Actos [pioglitazone], Metformin and related, Scopolamine, and Victoza [liraglutide]   Current Medications (verified) Outpatient Encounter Medications as of 03/06/2024  Medication Sig   acetaminophen  (TYLENOL ) 500 MG tablet Take 2 tablets (1,000 mg total) by mouth every 6 (six) hours as needed for mild pain.   atorvastatin  (LIPITOR) 40 MG tablet Take 1 tablet (40 mg total) by mouth once a week.   benazepril  (LOTENSIN ) 20 MG tablet TAKE 1 TABLET BY MOUTH EVERY DAY   ELIQUIS  5 MG TABS  tablet TAKE 1 TABLET BY MOUTH TWICE A DAY   FARXIGA  10 MG TABS tablet TAKE 1 TABLET BY MOUTH EVERY DAY   glucose  blood (ACCU-CHEK GUIDE TEST) test strip USE AS INSTRUCTED   linaclotide  (LINZESS ) 290 MCG CAPS capsule Take 1 capsule (290 mcg total) by mouth daily before breakfast.   metoprolol  succinate (TOPROL -XL) 50 MG 24 hr tablet Take 1 tablet (50 mg total) by mouth daily. TAKE WITH OR IMMEDIATELY FOLLOWING A MEAL in AM and take 25 mg ( 0.5 tablet) in the PM   nystatin  (MYCOSTATIN /NYSTOP ) powder APPLY TOPICALLY 3 TIMES A DAY   ondansetron  (ZOFRAN -ODT) 4 MG disintegrating tablet TAKE 1 TABLET BY MOUTH EVERY 8 HOURS AS NEEDED FOR NAUSEA AND VOMITING   pantoprazole  (PROTONIX ) 40 MG tablet TAKE 1 TABLET (40 MG TOTAL) BY MOUTH TWICE A DAY BEFORE MEALS   pregabalin  (LYRICA ) 150 MG capsule TAKE 1 CAPSULE BY MOUTH THREE TIMES A DAY   SODIUM FLUORIDE 5000 PPM 1.1 % PSTE See admin instructions.   tirzepatide  (MOUNJARO ) 7.5 MG/0.5ML Pen INJECT 7.5 MG SUBCUTANEOUSLY WEEKLY   traMADol  (ULTRAM ) 50 MG tablet TAKE 1 TABLET BY MOUTH TWICE A DAY   No facility-administered encounter medications on file as of 03/06/2024.    History: Past Medical History:  Diagnosis Date   Arthritis    Chronic kidney disease    Complication of anesthesia    Diabetes mellitus without complication (HCC)    Extreme obesity    Fatty liver    GERD (gastroesophageal reflux disease)    Hyperlipidemia    Hypertension    Palpitations    PONV (postoperative nausea and vomiting)    Radiculopathy, cervical    Renal insufficiency    stage 3   Sleep apnea    uses cpap   Tear of acetabular labrum 01/2021   right   Past Surgical History:  Procedure Laterality Date   ABDOMINAL HYSTERECTOMY     CARDIAC CATHETERIZATION     [facility]   CARDIOVERSION N/A 12/09/2022   Procedure: CARDIOVERSION;  Surgeon: Perla Evalene PARAS, MD;  Location: [facility] ORS;  Service: Cardiovascular;  Laterality: N/A;   CATARACT EXTRACTION W/PHACO Right 06/02/2021   Procedure: CATARACT EXTRACTION PHACO AND INTRAOCULAR LENS PLACEMENT (IOC) RIGHT DIABETIC;  Surgeon: Myrna Adine Anes, MD;  [facility];  Service: Ophthalmology;  Laterality: Right;  2.52 0:22.8   CATARACT EXTRACTION W/PHACO Left 06/16/2021   Procedure: CATARACT EXTRACTION PHACO AND INTRAOCULAR LENS PLACEMENT (IOC) LEFT DIABETIC 3.51 00:30.9;  Surgeon: Myrna Adine Anes, MD;  Location: [facility];  Service: Ophthalmology;  Laterality: Left;   CERVICAL FUSION     x2   CHOLECYSTECTOMY     COLONOSCOPY     COLONOSCOPY WITH PROPOFOL  N/A 09/09/2021   Procedure: COLONOSCOPY WITH PROPOFOL ;  Surgeon: Jinny Carmine, MD;  Location: [facility]COPY;  Service: Endoscopy;  Laterality: N/A;   ESOPHAGOGASTRODUODENOSCOPY (EGD) WITH PROPOFOL  N/A 09/09/2021   Procedure: ESOPHAGOGASTRODUODENOSCOPY (EGD) WITH PROPOFOL ;  Surgeon: Jinny Carmine, MD;  Location: [facility];  Service: Endoscopy;  Laterality: N/A;   ESOPHAGOGASTRODUODENOSCOPY (EGD) WITH PROPOFOL  N/A 09/15/2022   Procedure: ESOPHAGOGASTRODUODENOSCOPY (EGD) WITH PROPOFOL ;  Surgeon: Jinny Carmine, MD;  Location: [facility] END[facility];  Service: Endoscopy;  Laterality: N/A;   IR BONE MARROW BIOPSY & ASPIRATION  09/01/2023   JOINT REPLACEMENT Bilateral    knees   Family History  Problem Relation Age of Onset   Diabetes Mother    Heart disease Mother    Hypertension Mother    Heart  disease Father    Stroke Sister 105   Cancer Brother    Hypertension Brother    Breast cancer Maternal Aunt    Breast cancer Paternal Aunt    Stroke Daughter    Social History   Occupational History   Occupation: retired   Tobacco Use   Smoking status: Never    Passive exposure: Never   Smokeless tobacco: Never  Vaping Use   Vaping status: Never Used  Substance and Sexual Activity   Alcohol use: No   Drug use: No   Sexual activity: Not Currently   Tobacco Counseling Counseling given: Not Answered  SDOH Screenings   Food Insecurity: No Food Insecurity (03/06/2024)  Housing: Low Risk  (03/06/2024)  Transportation Needs: No Transportation  Needs (03/06/2024)  Utilities: Not At Risk (03/06/2024)  Alcohol Screen: Low Risk  (10/05/2022)  Depression (PHQ2-9): Low Risk  (03/06/2024)  Financial Resource Strain: Low Risk  (10/05/2022)  Physical Activity: Inactive (03/06/2024)  Social Connections: Moderately Isolated (03/06/2024)  Stress: No Stress Concern Present (03/06/2024)  Tobacco Use: Low Risk  (03/06/2024)  Health Literacy: Adequate Health Literacy (03/06/2024)   See flowsheets for full screening details  Depression Screen PHQ 2 & 9 Depression Scale- Over the past 2 weeks, how often have you been bothered by any of the following problems? Little interest or pleasure in doing things: 0 Feeling down, depressed, or hopeless (PHQ Adolescent also includes...irritable): 0 PHQ-2 Total Score: 0 Trouble falling or staying asleep, or sleeping too much: 0 Feeling tired or having little energy: 0 Poor appetite or overeating (PHQ Adolescent also includes...weight loss): 0 Feeling bad about yourself - or that you are a failure or have let yourself or your family down: 0 Trouble concentrating on things, such as reading the newspaper or watching television (PHQ Adolescent also includes...like school work): 0 Moving or speaking so slowly that other people could have noticed. Or the opposite - being so fidgety or restless that you have been moving around a lot more than usual: 0 Thoughts that you would be better off dead, or of hurting yourself in some way: 0 PHQ-9 Total Score: 0 If you checked off any problems, how difficult have these problems made it for you to do your work, take care of things at home, or get along with other people?: Not difficult at all     Goals Addressed               This Visit's Progress     dieting (pt-stated)               Objective:    Today's Vitals   03/06/24 1302  BP: 114/71  Pulse: 61  Temp: (!) 97.5 F (36.4 C)  TempSrc: Oral  SpO2: 96%  Weight: 243 lb (110.2 kg)  Height: 5' 2.01 (1.575 m)   PainSc: 0-No pain   Body mass index is 44.43 kg/m.  Hearing/Vision screen No results found. Immunizations and Health Maintenance Health Maintenance  Topic Date Due   DTaP/Tdap/Td (2 - Td or Tdap) 09/14/2023   COVID-19 Vaccine (4 - 2025-26 season) 12/06/2023   HEMOGLOBIN A1C  05/31/2024   Diabetic kidney evaluation - Urine ACR  08/25/2024   FOOT EXAM  08/25/2024   OPHTHALMOLOGY EXAM  09/06/2024   Diabetic kidney evaluation - eGFR measurement  11/28/2024   Medicare Annual Wellness (AWV)  03/06/2025   Colonoscopy  09/10/2031   Pneumococcal Vaccine: 50+ Years  Completed   Influenza Vaccine  Completed   Bone  Density Scan  Completed   Hepatitis C Screening  Completed   Zoster Vaccines- Shingrix  Completed   Meningococcal B Vaccine  Aged Out   Hepatitis B Vaccines 19-59 Average Risk  Discontinued   Mammogram  Discontinued        Assessment/Plan:  This is a routine wellness examination for Anna Juarez.  Patient Care Team: Melvin Pao, NP as PCP - General Jinny Carmine, MD as Consulting Physician (Gastroenterology) Josefina Chew, MD as Consulting Physician (Orthopedic Surgery) Pandora Cadet, [facility] (Pharmacist) Rennie Cindy SAUNDERS, MD as Consulting Physician (Oncology)  I have personally reviewed and noted the following in the patient's chart:   Medical and social history Use of alcohol, tobacco or illicit drugs  Current medications and supplements including opioid prescriptions. Functional ability and status Nutritional status Physical activity Advanced directives List of other physicians Hospitalizations, surgeries, and ER visits in previous 12 months Vitals Screenings to include cognitive, depression, and falls Referrals and appointments  No orders of the defined types were placed in this encounter.  In addition, I have reviewed and discussed with patient certain preventive protocols, quality metrics, and best practice recommendations. A written personalized care  plan for preventive services as well as general preventive health recommendations were provided to patient.   Joya GORMAN Louder, CMA   03/06/2024   No follow-ups on file.  After Visit Summary: (In Person-Printed) AVS printed and given to the patient  Nurse Notes:

## 2024-03-07 ENCOUNTER — Ambulatory Visit: Payer: Self-pay | Admitting: Nurse Practitioner

## 2024-03-07 LAB — COMPREHENSIVE METABOLIC PANEL WITH GFR
ALT: 12 IU/L (ref 0–32)
AST: 25 IU/L (ref 0–40)
Albumin: 4.3 g/dL (ref 3.8–4.8)
Alkaline Phosphatase: 69 IU/L (ref 49–135)
BUN/Creatinine Ratio: 11 — ABNORMAL LOW (ref 12–28)
BUN: 14 mg/dL (ref 8–27)
Bilirubin Total: 0.6 mg/dL (ref 0.0–1.2)
CO2: 23 mmol/L (ref 20–29)
Calcium: 9.6 mg/dL (ref 8.7–10.3)
Chloride: 101 mmol/L (ref 96–106)
Creatinine, Ser: 1.26 mg/dL — ABNORMAL HIGH (ref 0.57–1.00)
Globulin, Total: 2.6 g/dL (ref 1.5–4.5)
Glucose: 95 mg/dL (ref 70–99)
Potassium: 4.4 mmol/L (ref 3.5–5.2)
Sodium: 140 mmol/L (ref 134–144)
Total Protein: 6.9 g/dL (ref 6.0–8.5)
eGFR: 45 mL/min/1.73 — ABNORMAL LOW (ref >59)

## 2024-03-07 LAB — LIPID PANEL
Chol/HDL Ratio: 3.5 ratio (ref 0.0–4.4)
Cholesterol, Total: 187 mg/dL (ref 100–199)
HDL: 54 mg/dL (ref >39)
LDL Chol Calc (NIH): 107 mg/dL — ABNORMAL HIGH (ref 0–99)
Triglycerides: 147 mg/dL (ref 0–149)
VLDL Cholesterol Cal: 26 mg/dL (ref 5–40)

## 2024-03-07 LAB — HEMOGLOBIN A1C
Est. average glucose Bld gHb Est-mCnc: 117 mg/dL
Hgb A1c MFr Bld: 5.7 % — ABNORMAL HIGH (ref 4.8–5.6)

## 2024-03-22 ENCOUNTER — Inpatient Hospital Stay: Attending: Internal Medicine

## 2024-03-22 DIAGNOSIS — E1122 Type 2 diabetes mellitus with diabetic chronic kidney disease: Secondary | ICD-10-CM | POA: Insufficient documentation

## 2024-03-22 DIAGNOSIS — Z809 Family history of malignant neoplasm, unspecified: Secondary | ICD-10-CM | POA: Diagnosis not present

## 2024-03-22 DIAGNOSIS — N1832 Chronic kidney disease, stage 3b: Secondary | ICD-10-CM | POA: Diagnosis not present

## 2024-03-22 DIAGNOSIS — Z79899 Other long term (current) drug therapy: Secondary | ICD-10-CM | POA: Insufficient documentation

## 2024-03-22 DIAGNOSIS — D696 Thrombocytopenia, unspecified: Secondary | ICD-10-CM | POA: Diagnosis not present

## 2024-03-22 DIAGNOSIS — D472 Monoclonal gammopathy: Secondary | ICD-10-CM | POA: Diagnosis present

## 2024-03-22 DIAGNOSIS — M25559 Pain in unspecified hip: Secondary | ICD-10-CM | POA: Diagnosis not present

## 2024-03-22 DIAGNOSIS — Z803 Family history of malignant neoplasm of breast: Secondary | ICD-10-CM | POA: Insufficient documentation

## 2024-03-22 LAB — CBC WITH DIFFERENTIAL (CANCER CENTER ONLY)
Abs Immature Granulocytes: 0.02 K/uL (ref 0.00–0.07)
Basophils Absolute: 0 K/uL (ref 0.0–0.1)
Basophils Relative: 1 %
Eosinophils Absolute: 0.1 K/uL (ref 0.0–0.5)
Eosinophils Relative: 2 %
HCT: 42.7 % (ref 36.0–46.0)
Hemoglobin: 13.9 g/dL (ref 12.0–15.0)
Immature Granulocytes: 0 %
Lymphocytes Relative: 26 %
Lymphs Abs: 1.4 K/uL (ref 0.7–4.0)
MCH: 30.8 pg (ref 26.0–34.0)
MCHC: 32.6 g/dL (ref 30.0–36.0)
MCV: 94.7 fL (ref 80.0–100.0)
Monocytes Absolute: 0.5 K/uL (ref 0.1–1.0)
Monocytes Relative: 9 %
Neutro Abs: 3.3 K/uL (ref 1.7–7.7)
Neutrophils Relative %: 62 %
Platelet Count: 153 K/uL (ref 150–400)
RBC: 4.51 MIL/uL (ref 3.87–5.11)
RDW: 12.9 % (ref 11.5–15.5)
WBC Count: 5.2 K/uL (ref 4.0–10.5)
nRBC: 0 % (ref 0.0–0.2)

## 2024-03-22 LAB — CMP (CANCER CENTER ONLY)
ALT: 8 U/L (ref 0–44)
AST: 25 U/L (ref 15–41)
Albumin: 4.2 g/dL (ref 3.5–5.0)
Alkaline Phosphatase: 70 U/L (ref 38–126)
Anion gap: 11 (ref 5–15)
BUN: 18 mg/dL (ref 8–23)
CO2: 28 mmol/L (ref 22–32)
Calcium: 9.7 mg/dL (ref 8.9–10.3)
Chloride: 100 mmol/L (ref 98–111)
Creatinine: 1.41 mg/dL — ABNORMAL HIGH (ref 0.44–1.00)
GFR, Estimated: 39 mL/min — ABNORMAL LOW (ref >60)
Glucose, Bld: 97 mg/dL (ref 70–99)
Potassium: 4.3 mmol/L (ref 3.5–5.1)
Sodium: 139 mmol/L (ref 135–145)
Total Bilirubin: 0.7 mg/dL (ref 0.0–1.2)
Total Protein: 7.1 g/dL (ref 6.5–8.1)

## 2024-03-23 LAB — KAPPA/LAMBDA LIGHT CHAINS
Kappa free light chain: 18.7 mg/L (ref 3.3–19.4)
Kappa, lambda light chain ratio: 0.09 — ABNORMAL LOW (ref 0.26–1.65)
Lambda free light chains: 219.7 mg/L — ABNORMAL HIGH (ref 5.7–26.3)

## 2024-03-24 LAB — MULTIPLE MYELOMA PANEL, SERUM
Albumin SerPl Elph-Mcnc: 3.5 g/dL (ref 2.9–4.4)
Albumin/Glob SerPl: 1.2 (ref 0.7–1.7)
Alpha 1: 0.2 g/dL (ref 0.0–0.4)
Alpha2 Glob SerPl Elph-Mcnc: 0.9 g/dL (ref 0.4–1.0)
B-Globulin SerPl Elph-Mcnc: 1.1 g/dL (ref 0.7–1.3)
Gamma Glob SerPl Elph-Mcnc: 0.7 g/dL (ref 0.4–1.8)
Globulin, Total: 3 g/dL (ref 2.2–3.9)
IgA: 253 mg/dL (ref 64–422)
IgG (Immunoglobin G), Serum: 771 mg/dL (ref 586–1602)
IgM (Immunoglobulin M), Srm: 42 mg/dL (ref 26–217)
Total Protein ELP: 6.5 g/dL (ref 6.0–8.5)

## 2024-03-27 ENCOUNTER — Telehealth: Payer: Self-pay | Admitting: Nurse Practitioner

## 2024-03-27 NOTE — Telephone Encounter (Signed)
 Patient stated it's okay if she goes back on Linzess , but she wants to know if there is something else she can do while also taking the medication. She tried using a stool softener, but it did not work for her. I informed her I would speak with the provider and follow up tomorrow.

## 2024-03-27 NOTE — Telephone Encounter (Signed)
 Routing to provider to advise. Is there something else that we can send in for the patient?

## 2024-03-27 NOTE — Telephone Encounter (Unsigned)
 Copied from CRM #[id number]. Topic: Clinical - Medication Question >> Mar 27, 2024 11:33 AM Winona R wrote: Pt calling to inform the provider her lubiprostone  (AMITIZA ) 24 MCG capsule is not working and would like something else be called in that will help with constipation. [facility] [phone]

## 2024-03-27 NOTE — Telephone Encounter (Signed)
 We can go back to the Linzess .  But otherwise, there aren't other options.

## 2024-03-28 MED ORDER — LINACLOTIDE 290 MCG PO CAPS: 290.0000 ug | ORAL_CAPSULE | Freq: Every day | ORAL | 1 refills | Status: AC

## 2024-03-28 NOTE — Telephone Encounter (Signed)
 Followed back up with the patient in regards to constipation and medication change, and she has no other concerns at this time.

## 2024-03-28 NOTE — Telephone Encounter (Signed)
 Medication changed back to Linzess .  Okay to use Miralax and stool softner to help with constipation.

## 2024-03-29 NOTE — Progress Notes (Signed)
 [facility] [address] [place], [place] [address]  Pulmonary Sleep Medicine   Office Visit Note  Patient Name: Anna Juarez DOB: 1950-06-27 MRN [id number]    Chief Complaint: Obstructive Sleep Apnea visit  Brief History:  Kortlynn is seen today for an annual follow up visit for CPAP@ 13 cmH2O. The patient has a 14 year history of sleep apnea. Patient is using PAP nightly.  The patient feels rested after sleeping with PAP.  The patient reports benefiting from PAP use. Reported sleepiness is  improved and the Epworth Sleepiness Score is 3 out of 24. The patient will rarely take naps. The patient complains of the following: none.  The compliance download 98% shows  compliance with an average use time of 7 hours 59 minutes. The AHI is 1.7.  The patient does not complain of limb movements disrupting sleep. The patient continues to require PAP therapy in order to eliminate sleep apnea.   ROS  General: (-) fever, (-) chills, (-) night sweat Nose and Sinuses: (-) nasal stuffiness or itchiness, (-) postnasal drip, (-) nosebleeds, (-) sinus trouble. Mouth and Throat: (-) sore throat, (-) hoarseness. Neck: (-) swollen glands, (-) enlarged thyroid , (-) neck pain. Respiratory: - cough, - shortness of breath, - wheezing. Neurologic: - numbness, - tingling. Psychiatric: - anxiety, - depression   Current Medication: Outpatient Encounter Medications as of 04/03/2024  Medication Sig   acetaminophen  (TYLENOL ) 500 MG tablet Take 2 tablets (1,000 mg total) by mouth every 6 (six) hours as needed for mild pain.   atorvastatin  (LIPITOR) 40 MG tablet Take 1 tablet (40 mg total) by mouth once a week.   benazepril  (LOTENSIN ) 20 MG tablet TAKE 1 TABLET BY MOUTH EVERY DAY   dapagliflozin  propanediol (FARXIGA ) 10 MG TABS tablet Take 1 tablet (10 mg total) by mouth daily.   ELIQUIS  5 MG TABS tablet TAKE 1 TABLET BY MOUTH TWICE A DAY   glucose blood (ACCU-CHEK GUIDE TEST) test strip USE AS  INSTRUCTED   linaclotide  (LINZESS ) 290 MCG CAPS capsule Take 1 capsule (290 mcg total) by mouth daily before breakfast.   metoprolol  succinate (TOPROL -XL) 50 MG 24 hr tablet Take 1 tablet (50 mg total) by mouth daily. TAKE WITH OR IMMEDIATELY FOLLOWING A MEAL in AM and take 25 mg ( 0.5 tablet) in the PM   nystatin  (MYCOSTATIN /NYSTOP ) powder Apply topically 2 (two) times daily.   ondansetron  (ZOFRAN -ODT) 4 MG disintegrating tablet Take 1 tablet (4 mg total) by mouth every 8 (eight) hours as needed for nausea or vomiting.   pantoprazole  (PROTONIX ) 40 MG tablet Take 1 tablet (40 mg total) by mouth daily.   pregabalin  (LYRICA ) 150 MG capsule Take 1 capsule (150 mg total) by mouth 2 (two) times daily.   SODIUM FLUORIDE 5000 PPM 1.1 % PSTE See admin instructions.   tirzepatide  (MOUNJARO ) 10 MG/0.5ML Pen Inject 10 mg into the skin once a week.   traMADol  (ULTRAM -ER) 100 MG 24 hr tablet Take 1 tablet (100 mg total) by mouth daily.   No facility-administered encounter medications on file as of 04/03/2024.    Surgical History: Past Surgical History:  Procedure Laterality Date   ABDOMINAL HYSTERECTOMY     CARDIAC CATHETERIZATION     [facility]   CARDIOVERSION N/A 12/09/2022   Procedure: CARDIOVERSION;  Surgeon: Perla Evalene PARAS, MD;  Location: [facility] ORS;  Service: Cardiovascular;  Laterality: N/A;   CATARACT EXTRACTION W/PHACO Right 06/02/2021   Procedure: CATARACT EXTRACTION PHACO AND INTRAOCULAR LENS PLACEMENT (IOC) RIGHT DIABETIC;  Surgeon: Myrna Adine Anes, MD;  Location: [facility];  Service: Ophthalmology;  Laterality: Right;  2.52 0:22.8   CATARACT EXTRACTION W/PHACO Left 06/16/2021   Procedure: CATARACT EXTRACTION PHACO AND INTRAOCULAR LENS PLACEMENT (IOC) LEFT DIABETIC 3.51 00:30.9;  Surgeon: Myrna Adine Anes, MD;  Location: [facility];  Service: Ophthalmology;  Laterality: Left;   CERVICAL FUSION     x2   CHOLECYSTECTOMY     COLONOSCOPY     COLONOSCOPY WITH PROPOFOL  N/A  09/09/2021   Procedure: COLONOSCOPY WITH PROPOFOL ;  Surgeon: Jinny Carmine, MD;  Location: [facility] ENDOSCOPY;  Service: Endoscopy;  Laterality: N/A;   ESOPHAGOGASTRODUODENOSCOPY (EGD) WITH PROPOFOL  N/A 09/09/2021   Procedure: ESOPHAGOGASTRODUODENOSCOPY (EGD) WITH PROPOFOL ;  Surgeon: Jinny Carmine, MD;  Location: [facility] END[facility]COPY;  Service: Endoscopy;  Laterality: N/A;   ESOPHAGOGASTRODUODENOSCOPY (EGD) WITH PROPOFOL  N/A 09/15/2022   Procedure: ESOPHAGOGASTRODUODENOSCOPY (EGD) WITH PROPOFOL ;  Surgeon: Jinny Carmine, MD;  Location: [facility];  Service: Endoscopy;  Laterality: N/A;   IR BONE MARROW BIOPSY & ASPIRATION  09/01/2023   JOINT REPLACEMENT Bilateral    knees    Medical History: Past Medical History:  Diagnosis Date   Arthritis    Chronic kidney disease    Complication of anesthesia    Diabetes mellitus without complication (HCC)    Extreme obesity    Fatty liver    GERD (gastroesophageal reflux disease)    Hyperlipidemia    Hypertension    Palpitations    PONV (postoperative nausea and vomiting)    Radiculopathy, cervical    Renal insufficiency    stage 3   Sleep apnea    uses cpap   Tear of acetabular labrum 01/2021   right    Family History: Non contributory to the present illness  Social History: Social History   Socioeconomic History   Marital status: Widowed    Spouse name: Not on file   Number of children: Not on file   Years of education: Not on file   Highest education level: 12th grade  Occupational History   Occupation: retired   Tobacco Use   Smoking status: Never    Passive exposure: Never   Smokeless tobacco: Never  Vaping Use   Vaping status: Never Used  Substance and Sexual Activity   Alcohol use: No   Drug use: No   Sexual activity: Not Currently  Other Topics Concern   Not on file  Social History Narrative   Live in [place]; lives alone; never smoker; no alcohol; retitred.   Social Drivers of Health   Tobacco Use: Low Risk  (04/03/2024)   Patient History    Smoking Tobacco Use: Never    Smokeless Tobacco Use: Never    Passive Exposure: Never  Financial Resource Strain: Low Risk (10/05/2022)   Overall Financial Resource Strain (CARDIA)    Difficulty of Paying Living Expenses: Not hard at all  Food Insecurity: No Food Insecurity (03/06/2024)   Epic    Worried About Programme Researcher, Broadcasting/film/video in the Last Year: Never true    Ran Out of Food in the Last Year: Never true  Transportation Needs: No Transportation Needs (03/06/2024)   Epic    Lack of Transportation (Medical): No    Lack of Transportation (Non-Medical): No  Physical Activity: Inactive (03/06/2024)   Exercise Vital Sign    Days of Exercise per Week: 0 days    Minutes of Exercise per Session: 0 min  Stress: No Stress Concern Present (03/06/2024)   [employer] of Occupational  Health - Occupational Stress Questionnaire    Feeling of Stress: Not at all  Social Connections: Moderately Isolated (03/06/2024)   Social Connection and Isolation Panel    Frequency of Communication with Friends and Family: More than three times a week    Frequency of Social Gatherings with Friends and Family: More than three times a week    Attends Religious Services: More than 4 times per year    Active Member of [employer] or Organizations: No    Attends Banker Meetings: Never    Marital Status: Widowed  Intimate Partner Violence: Not At Risk (03/06/2024)   Epic    Fear of Current or Ex-Partner: No    Emotionally Abused: No    Physically Abused: No    Sexually Abused: No  Depression (PHQ2-9): Low Risk (03/06/2024)   Depression (PHQ2-9)    PHQ-2 Score: 0  Alcohol Screen: Low Risk (10/05/2022)   Alcohol Screen    Last Alcohol Screening Score (AUDIT): 0  Housing: Low Risk (03/06/2024)   Epic    Unable to Pay for Housing in the Last Year: No    Number of Times Moved in the Last Year: 0    Homeless in the Last Year: No  Utilities: Not At Risk (03/06/2024)   Epic     Threatened with loss of utilities: No  Health Literacy: Adequate Health Literacy (03/06/2024)   B1300 Health Literacy    Frequency of need for help with medical instructions: Never    Vital Signs: Blood pressure 96/66, pulse 62, resp. rate 16, height 5' (1.524 m), weight 230 lb (104.3 kg), SpO2 98%. Body mass index is 44.92 kg/m.    Examination: General Appearance: The patient is well-developed, well-nourished, and in no distress. Neck Circumference: 38 cm Skin: Gross inspection of skin unremarkable. Head: normocephalic, no gross deformities. Eyes: no gross deformities noted. ENT: ears appear grossly normal Neurologic: Alert and oriented. No involuntary movements.  STOP BANG RISK ASSESSMENT S (snore) Have you been told that you snore?     NO   T (tired) Are you often tired, fatigued, or sleepy during the day?   NO  O (obstruction) Do you stop breathing, choke, or gasp during sleep? NO   P (pressure) Do you have or are you being treated for high blood pressure? YES   B (BMI) Is your body index greater than 35 kg/m? YES   A (age) Are you 56 years old or older? YES   N (neck) Do you have a neck circumference greater than 16 inches?   NO   G (gender) Are you a female? NO   TOTAL STOP/BANG YES ANSWERS 3       A STOP-Bang score of 2 or less is considered low risk, and a score of 5 or more is high risk for having either moderate or severe OSA. For people who score 3 or 4, doctors may need to perform further assessment to determine how likely they are to have OSA.         EPWORTH SLEEPINESS SCALE:  Scale:  (0)= no chance of dozing; (1)= slight chance of dozing; (2)= moderate chance of dozing; (3)= high chance of dozing  Chance  Situtation    Sitting and reading: 3    Watching TV: 0    Sitting Inactive in public: 0    As a passenger in car: 0      Lying down to rest: 0    Sitting and talking: 0  Sitting quielty after lunch: 0    In a car, stopped in traffic:  0   TOTAL SCORE:   3 out of 24    SLEEP STUDIES:  PSG (07/2022) AHI 3.3/hr, min SpO2 85%, repeat study. PSG (09/2022) AHI 6.5/hr, REM AHI 18/hr, min SpO2 77% Titration (10/2022) CPAP@ 8 cmH2O   CPAP COMPLIANCE DATA:  Date Range: 03/29/2023-03/27/2024  Average Daily Use: 7 hours 59 minutes  Median Use: 8 hours 7 minutes  Compliance for > 4 Hours: 98%  AHI: 1.7 respiratory events per hour  Days Used: 364/365 days  Mask Leak: 69.5  95th Percentile Pressure: 13         LABS: Recent Results (from the past 2160 hours)  Comprehensive metabolic panel with GFR     Status: Abnormal   Collection Time: 03/06/24  1:38 PM  Result Value Ref Range   Glucose 95 70 - 99 mg/dL   BUN 14 8 - 27 mg/dL   Creatinine, Ser 8.73 (H) 0.57 - 1.00 mg/dL   eGFR 45 (L) >40 fO/fpw/8.26   BUN/Creatinine Ratio 11 (L) 12 - 28   Sodium 140 134 - 144 mmol/L   Potassium 4.4 3.5 - 5.2 mmol/L   Chloride 101 96 - 106 mmol/L   CO2 23 20 - 29 mmol/L   Calcium  9.6 8.7 - 10.3 mg/dL   Total Protein 6.9 6.0 - 8.5 g/dL   Albumin 4.3 3.8 - 4.8 g/dL   Globulin, Total 2.6 1.5 - 4.5 g/dL   Bilirubin Total 0.6 0.0 - 1.2 mg/dL   Alkaline Phosphatase 69 49 - 135 IU/L   AST 25 0 - 40 IU/L   ALT 12 0 - 32 IU/L  Hemoglobin A1c     Status: Abnormal   Collection Time: 03/06/24  1:38 PM  Result Value Ref Range   Hgb A1c MFr Bld 5.7 (H) 4.8 - 5.6 %    Comment:          Prediabetes: 5.7 - 6.4          Diabetes: >6.4          Glycemic control for adults with diabetes: <7.0    Est. average glucose Bld gHb Est-mCnc 117 mg/dL  Lipid panel     Status: Abnormal   Collection Time: 03/06/24  1:38 PM  Result Value Ref Range   Cholesterol, Total 187 100 - 199 mg/dL   Triglycerides 852 0 - 149 mg/dL   HDL 54 >60 mg/dL   VLDL Cholesterol Cal 26 5 - 40 mg/dL   LDL Chol Calc (NIH) 892 (H) 0 - 99 mg/dL   Chol/HDL Ratio 3.5 0.0 - 4.4 ratio    Comment:                                   T. Chol/HDL Ratio                                              Men  Women                               1/2 Avg.Risk  3.4    3.3  Avg.Risk  5.0    4.4                                2X Avg.Risk  9.6    7.1                                3X Avg.Risk 23.4   11.0   Kappa/lambda light chains     Status: Abnormal   Collection Time: 03/22/24 12:44 PM  Result Value Ref Range   Kappa free light chain 18.7 3.3 - 19.4 mg/L   Lambda free light chains 219.7 (H) 5.7 - 26.3 mg/L   Kappa, lambda light chain ratio 0.09 (L) 0.26 - 1.65    Comment: (NOTE) Performed At: [facility] [address] [place], [place] [id number] Jennette Shorter MD Ey:[phone]   Multiple Myeloma Panel (SPEP&IFE w/QIG)     Status: Abnormal   Collection Time: 03/22/24 12:44 PM  Result Value Ref Range   IgG (Immunoglobin G), Serum 771 586 - 1,602 mg/dL   IgA 746 64 - 577 mg/dL   IgM (Immunoglobulin M), Srm 42 26 - 217 mg/dL   Total Protein ELP 6.5 6.0 - 8.5 g/dL   Albumin SerPl Elph-Mcnc 3.5 2.9 - 4.4 g/dL   Alpha 1 0.2 0.0 - 0.4 g/dL   Alpha2 Glob SerPl Elph-Mcnc 0.9 0.4 - 1.0 g/dL   B-Globulin SerPl Elph-Mcnc 1.1 0.7 - 1.3 g/dL   Gamma Glob SerPl Elph-Mcnc 0.7 0.4 - 1.8 g/dL   M Protein SerPl Elph-Mcnc Not Observed Not Observed g/dL   Globulin, Total 3.0 2.2 - 3.9 g/dL   Albumin/Glob SerPl 1.2 0.7 - 1.7   IFE 1 Comment (A)     Comment: (NOTE) 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    Please Note Comment     Comment: (NOTE) Protein electrophoresis scan will follow via computer, mail, or courier delivery. Performed At: [facility] [address] [place], [place] [id number] Jennette Shorter MD Ey:[phone]   CMP (Cancer Center only)     Status: Abnormal   Collection Time: 03/22/24 12:44 PM  Result Value Ref Range   Sodium 139 135 - 145 mmol/L   Potassium 4.3 3.5 - 5.1 mmol/L   Chloride 100 98 - 111 mmol/L   CO2 28 22 - 32 mmol/L   Glucose, Bld 97 70 -  99 mg/dL    Comment: Glucose reference range applies only to samples taken after fasting for at least 8 hours.   BUN 18 8 - 23 mg/dL   Creatinine 8.58 (H) 9.55 - 1.00 mg/dL   Calcium  9.7 8.9 - 10.3 mg/dL   Total Protein 7.1 6.5 - 8.1 g/dL   Albumin 4.2 3.5 - 5.0 g/dL   AST 25 15 - 41 U/L   ALT 8 0 - 44 U/L   Alkaline Phosphatase 70 38 - 126 U/L   Total Bilirubin 0.7 0.0 - 1.2 mg/dL   GFR, Estimated 39 (L) >60 mL/min    Comment: (NOTE) Calculated using the CKD-EPI Creatinine Equation (2021)    Anion gap 11 5 - 15    Comment: Performed at [facility], [address] [place], [place] [address]  CBC with Differential (Cancer Center Only)     Status: None   Collection Time: 03/22/24 12:44 PM  Result Value Ref Range   WBC Count  5.2 4.0 - 10.5 K/uL   RBC 4.51 3.87 - 5.11 MIL/uL   Hemoglobin 13.9 12.0 - 15.0 g/dL   HCT 57.2 63.9 - 53.9 %   MCV 94.7 80.0 - 100.0 fL   MCH 30.8 26.0 - 34.0 pg   MCHC 32.6 30.0 - 36.0 g/dL   RDW 87.0 88.4 - 84.4 %   Platelet Count 153 150 - 400 K/uL   nRBC 0.0 0.0 - 0.2 %   Neutrophils Relative % 62 %   Neutro Abs 3.3 1.7 - 7.7 K/uL   Lymphocytes Relative 26 %   Lymphs Abs 1.4 0.7 - 4.0 K/uL   Monocytes Relative 9 %   Monocytes Absolute 0.5 0.1 - 1.0 K/uL   Eosinophils Relative 2 %   Eosinophils Absolute 0.1 0.0 - 0.5 K/uL   Basophils Relative 1 %   Basophils Absolute 0.0 0.0 - 0.1 K/uL   Immature Granulocytes 0 %   Abs Immature Granulocytes 0.02 0.00 - 0.07 K/uL    Comment: Performed at [facility], [address] [place], [place] [address]    Radiology: DG Sacrum/Coccyx Result Date: 12/09/2023 CLINICAL DATA:  Sacral pain. EXAM: SACRUM AND COCCYX - 2+ VIEW COMPARISON:  None Available. FINDINGS: There is no evidence of fracture or other focal bone lesions. The cortical margins of the sacrum are intact. There is sclerosis and subchondral cystic change about both sacroiliac joints. Multiple pelvic phleboliths. IMPRESSION: 1. No acute  findings. 2. Bilateral sacroiliac joint arthropathy. Electronically Signed   By: Andrea Gasman M.D.   On: 12/09/2023 23:22    No results found.  No results found.    Assessment and Plan: Patient Active Problem List   Diagnosis Date Noted   Diabetes mellitus treated with injections of non-insulin medication (HCC) 06/01/2023   SOB (shortness of breath) 12/09/2022   Atrial fibrillation (HCC) 09/21/2022   Abdominal pain, generalized 09/15/2022   Acute intractable headache 07/23/2022   OSA (obstructive sleep apnea) 06/15/2022   CPAP use counseling 06/15/2022   Morbid obesity (HCC) 06/15/2022   Sleep apnea    Generalized abdominal pain    Polyp of ascending colon    Gastritis without bleeding    BRBPR (bright red blood per rectum) 06/10/2021   Constipation 06/10/2021   Symptomatic cholelithiasis    Monoclonal gammopathy 02/20/2020   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 OSA (obstructive sleep apnea) (Primary) The patient does tolerate PAP and reports  benefit from PAP use.  She loves her cpap machine. The patient was reminded how to clean equipment and advised to replace supplies routinely. The patient was also counselled on weight loss. The compliance is excellent. The AHI is 1.7.   OSA on cpap- controlled. Continue with excellent compliance with pap. CPAP continues to be medically necessary to treat this patient's OSA. F/u one year.    2. CPAP use counseling CPAP Counseling: had a lengthy discussion with the patient regarding the importance of PAP therapy in management of the sleep apnea.  Patient appears to understand the risk factor reduction and also understands the risks associated with untreated sleep apnea. Patient will  try to make a good faith effort to remain compliant with therapy. Also instructed the patient on proper cleaning of the device including the water must be changed daily if possible and use of distilled water is preferred. Patient understands that the machine should be regularly cleaned with appropriate recommended cleaning solutions that do not damage the PAP machine for example given white vinegar and water rinses. Other methods such as ozone treatment may not be as good as these simple methods to achieve cleaning.   3. Morbid obesity (HCC) Obesity Counseling: Had a lengthy discussion regarding patients BMI and weight issues. Patient was instructed on portion control as well as increased activity. Also discussed caloric restrictions with trying to maintain intake less than 2000 Kcal. Discussions were made in accordance with the 5As of weight management. Simple actions such as not eating late and if able to, taking a walk is suggested.     General Counseling: I have discussed the findings of the evaluation and examination with Lynniah.  I have also discussed any further diagnostic evaluation thatmay be needed or ordered today. Sami verbalizes understanding of the findings of todays visit. We also reviewed her medications today and discussed drug interactions and side effects including but not limited excessive drowsiness and altered mental states. We also discussed that there is always a risk not just to her but also people around her. she has been encouraged to call the office with any questions or concerns that should arise related to todays visit.  No orders of the defined types were placed in this encounter.       I have personally obtained a history, examined the patient, evaluated laboratory and imaging results, formulated the assessment and plan and placed  orders. This patient was seen today by Lauraine Lay, PA-C in collaboration with Dr. Elfreda Bathe.   Elfreda DELENA Bathe, MD [facility] Diplomate ABMS Pulmonary Critical Care Medicine and Sleep Medicine

## 2024-04-03 ENCOUNTER — Ambulatory Visit: Admitting: Internal Medicine

## 2024-04-03 VITALS — BP 96/66 | HR 62 | Resp 16 | Ht 60.0 in | Wt 230.0 lb

## 2024-04-03 DIAGNOSIS — G4733 Obstructive sleep apnea (adult) (pediatric): Secondary | ICD-10-CM

## 2024-04-03 DIAGNOSIS — Z7189 Other specified counseling: Secondary | ICD-10-CM

## 2024-04-03 NOTE — Patient Instructions (Signed)

## 2024-04-04 ENCOUNTER — Inpatient Hospital Stay: Admitting: Internal Medicine

## 2024-04-04 ENCOUNTER — Encounter: Payer: Self-pay | Admitting: Internal Medicine

## 2024-04-04 VITALS — BP 106/61 | HR 67 | Temp 96.2°F | Resp 16 | Ht 60.0 in | Wt 234.9 lb

## 2024-04-04 DIAGNOSIS — D472 Monoclonal gammopathy: Secondary | ICD-10-CM | POA: Diagnosis not present

## 2024-04-04 NOTE — Assessment & Plan Note (Addendum)
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 CBC-hemoglobin normal; normal calcium . MAy 28th, 2025- BONE MARROW-- Normocellular bone marrow with orderly trilineage hematopoiesis and minimal open involvement by a plasma cell population with aberrant  CD117/CD56 expression and apparent lambda predominance consistent with a plasma cell neoplasm. hile there are a limited number of plasma cells on the biopsy, clot section and aspirate (approximately 5%) with an overall normal immune architecture they do appear lambda predominant which correlates with the serological findings.  In addition, there is aberrant CD56/CD117 expression on the plasma cells consistent with a plasma cell neoplasm.  Cytogenetics- QNS/NEG.   # Discussed that bone marrow/myeloma involvement could be patchy-but given absence of endorgan dysfunction I reviewed with the patient regarding the overall reassuring results of the bone marrow biopsy.  Also reviewed no  obvious endorgan dysfunction at this time. But however worsening lower back pain I would recommend further evaluation of the PET scan.  # Thrombocytopenia [likely artifactual given extensive platelet clumping; recommend citrate tube)- Stable.   # Stage 3b chronic kidney disease- GFR 38 - stable. [Dr.[name]]-unlikely related to multiple myeloma.  # Poorly controlled DM- HbA1c-6.1 -recommend close monitoring of blood sugars/compliance with medications. Stable.   # Hip pain- hip MRI w/o contrast [PCP]-10/26-MRI hip shows no evidence or concerns of myelomatous involvement; more consistent with osteoarthritis. Stable.   # DISPOSITION: # PET scan ASAP- # follow up - TBD- Dr.B

## 2024-04-04 NOTE — Progress Notes (Signed)
 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 CBC-hemoglobin normal; normal calcium . MAy 28th, 2025- BONE MARROW-- Normocellular bone marrow with orderly trilineage hematopoiesis and minimal open involvement by a plasma cell population with aberrant  CD117/CD56 expression and apparent lambda predominance consistent with a plasma cell neoplasm. hile there are a limited number of plasma cells on the biopsy, clot section and aspirate (approximately 5%) with an overall normal immune architecture they do appear lambda predominant which correlates with the serological findings.  In addition, there is aberrant CD56/CD117 expression on the plasma cells consistent with a plasma cell neoplasm.    #Stage III CKD/diabetes/osteoarthritis  HISTORY OF PRESENTING ILLNESS: Patient is  with her daughter, Santana.  Walking independently.   Blythe K Earwood 73 y.o.  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   Discussed the use of AI scribe software for clinical note transcription with the patient, who gave verbal consent to proceed.  History of Present Illness   Brittay ERNEST ORR is a 73 year old female with monoclonal gammopathy and stable stage 3 chronic kidney disease who presents for oncology follow-up and surveillance of her gammopathy.  Her monoclonal gammopathy is characterized by persistently elevated light chains, with  values in the 200 range over the past year (204 in November, 189 at the last visit, and 219 currently). Multiple imaging studies, including MRI and abdominal scans in 2024 during an episode of abdominal pain attributed to gallstones, were performed.  She experiences chronic lower back pain localized to the lower sacral region, without radiation to the lower extremities. She does not report true leg numbness but describes intermittent lower extremity weakness, stating her legs sometimes just won't go when walking. She occasionally feels unsteady and is concerned about her risk of falls, leading her to modify her activities to avoid dizziness and minimize fall risk. She denies new masses, abnormal bleeding, or other concerning symptoms.  Her stage 3 chronic kidney disease has remained stable for at least ten years, with creatinine values consistently in the 37-39 range. She denies any new or worsening symptoms attributable to her kidney disease.       Review of Systems  Constitutional:  Positive for malaise/fatigue. Negative for chills, diaphoresis, fever and weight loss.  HENT:  Negative for nosebleeds and sore throat.   Eyes:  Negative for double vision.  Respiratory:  Negative for cough, hemoptysis, sputum production, shortness of breath and wheezing.   Cardiovascular:  Negative for chest pain, palpitations, orthopnea and leg swelling.  Gastrointestinal:  Negative for blood in stool, constipation, diarrhea, heartburn, melena, nausea and vomiting.  Genitourinary:  Negative for dysuria, frequency and urgency.  Musculoskeletal:  Positive for back pain and joint pain.  Skin: Negative.  Negative for itching and rash.  Neurological:  Negative for dizziness, tingling, focal weakness, weakness and headaches.  Endo/Heme/Allergies:  Does not bruise/bleed easily.  Psychiatric/Behavioral:  Negative for depression. The patient is not nervous/anxious and does not have insomnia.      MEDICAL HISTORY:   Past  Medical History:  Diagnosis Date   Arthritis    Chronic kidney disease    Complication of anesthesia    Diabetes mellitus without complication (HCC)    Extreme obesity    Fatty liver    GERD (gastroesophageal reflux disease)    Hyperlipidemia    Hypertension    Palpitations    PONV (postoperative nausea and vomiting)    Radiculopathy, cervical    Renal insufficiency    stage 3   Sleep apnea    uses cpap   Tear of acetabular labrum 01/2021   right    SURGICAL HISTORY: Past Surgical History:  Procedure Laterality Date   ABDOMINAL HYSTERECTOMY     CARDIAC CATHETERIZATION     [facility]   CARDIOVERSION N/A 12/09/2022   Procedure: CARDIOVERSION;  Surgeon: Perla Evalene PARAS, MD;  Location: [facility];  Service: Cardiovascular;  Laterality: N/A;   CATARACT EXTRACTION W/PHACO Right 06/02/2021   Procedure: CATARACT EXTRACTION PHACO AND INTRAOCULAR LENS PLACEMENT (IOC) RIGHT DIABETIC;  Surgeon: Myrna Adine Anes, MD;  Location: [facility];  Service: Ophthalmology;  Laterality: Right;  2.52 0:22.8   CATARACT EXTRACTION W/PHACO Left 06/16/2021   Procedure: CATARACT EXTRACTION PHACO AND INTRAOCULAR LENS PLACEMENT (IOC) LEFT DIABETIC 3.51 00:30.9;  Surgeon: Myrna Adine Anes, MD;  Location: [facility];  Service: Ophthalmology;  Laterality: Left;   CERVICAL FUSION     x2   CHOLECYSTECTOMY     COLONOSCOPY     COLONOSCOPY WITH PROPOFOL  N/A 09/09/2021   Procedure: COLONOSCOPY WITH PROPOFOL ;  Surgeon: Jinny Carmine, MD;  Location: [facility];  Service: Endoscopy;  Laterality: N/A;   ESOPHAGOGASTRODUODENOSCOPY (EGD) WITH PROPOFOL  N/A 09/09/2021   Procedure: ESOPHAGOGASTRODUODENOSCOPY (EGD) WITH PROPOFOL ;  Surgeon: Jinny Carmine, MD;  Location: [facility];  Service: Endoscopy;  Laterality: N/A;   ESOPHAGOGASTRODUODENOSCOPY (EGD) WITH PROPOFOL  N/A 09/15/2022   Procedure: ESOPHAGOGASTRODUODENOSCOPY (EGD) WITH PROPOFOL ;  Surgeon: Jinny Carmine, MD;  Location: [facility];   Service: Endoscopy;  Laterality: N/A;   IR BONE MARROW BIOPSY & ASPIRATION  09/01/2023   JOINT REPLACEMENT Bilateral    knees    SOCIAL HISTORY: Social History   Socioeconomic History   Marital status: Widowed    Spouse name: Not on file   Number of children: Not on file   Years of education: Not on file   Highest education level: 12th grade  Occupational History   Occupation: retired   Tobacco Use   Smoking status: Never    Passive exposure: Never   Smokeless tobacco: Never  Vaping Use   Vaping status: Never Used  Substance and Sexual Activity   Alcohol use: No   Drug use: No   Sexual activity: Not Currently  Other Topics Concern   Not on file  Social History Narrative   Live in [place]; lives alone; never smoker; no alcohol; retitred.   Social Drivers of Health   Tobacco Use: Low Risk (04/04/2024)   Patient History    Smoking Tobacco Use: Never    Smokeless Tobacco Use: Never    Passive Exposure: Never  Financial Resource Strain: Low Risk (10/05/2022)   Overall Financial Resource Strain (CARDIA)    Difficulty of Paying Living Expenses: Not hard at all  Food Insecurity: No Food Insecurity (03/06/2024)   Epic    Worried About Radiation Protection Practitioner of Food in the Last Year: Never true    Ran Out of Food in the Last Year: Never true  Transportation Needs: No Transportation Needs (03/06/2024)   Epic  Lack of Transportation (Medical): No    Lack of Transportation (Non-Medical): No  Physical Activity: Inactive (03/06/2024)   Exercise Vital Sign    Days of Exercise per Week: 0 days    Minutes of Exercise per Session: 0 min  Stress: No Stress Concern Present (03/06/2024)   [employer] of Occupational Health - Occupational Stress Questionnaire    Feeling of Stress: Not at all  Social Connections: Moderately Isolated (03/06/2024)   Social Connection and Isolation Panel    Frequency of Communication with Friends and Family: More than three times a week    Frequency of  Social Gatherings with Friends and Family: More than three times a week    Attends Religious Services: More than 4 times per year    Active Member of [employer] or Organizations: No    Attends Banker Meetings: Never    Marital Status: Widowed  Intimate Partner Violence: Not At Risk (03/06/2024)   Epic    Fear of Current or Ex-Partner: No    Emotionally Abused: No    Physically Abused: No    Sexually Abused: No  Depression (PHQ2-9): Low Risk (04/04/2024)   Depression (PHQ2-9)    PHQ-2 Score: 0  Alcohol Screen: Low Risk (10/05/2022)   Alcohol Screen    Last Alcohol Screening Score (AUDIT): 0  Housing: Low Risk (03/06/2024)   Epic    Unable to Pay for Housing in the Last Year: No    Number of Times Moved in the Last Year: 0    Homeless in the Last Year: No  Utilities: Not At Risk (03/06/2024)   Epic    Threatened with loss of utilities: No  Health Literacy: Adequate Health Literacy (03/06/2024)   B1300 Health Literacy    Frequency of need for help with medical instructions: Never    FAMILY HISTORY: Family History  Problem Relation Age of Onset   Diabetes Mother    Heart disease Mother    Hypertension Mother    Heart disease Father    Stroke Sister 15   Cancer Brother    Hypertension Brother    Breast cancer Maternal Aunt    Breast cancer Paternal Aunt    Stroke Daughter     ALLERGIES:  is allergic to actos [pioglitazone], metformin and related, scopolamine, and victoza [liraglutide].  MEDICATIONS:  Current Outpatient Medications  Medication Sig Dispense Refill   acetaminophen  (TYLENOL ) 500 MG tablet Take 2 tablets (1,000 mg total) by mouth every 6 (six) hours as needed for mild pain.     atorvastatin  (LIPITOR) 40 MG tablet Take 1 tablet (40 mg total) by mouth once a week. 52 tablet 0   benazepril  (LOTENSIN ) 20 MG tablet TAKE 1 TABLET BY MOUTH EVERY DAY 90 tablet 3   dapagliflozin  propanediol (FARXIGA ) 10 MG TABS tablet Take 1 tablet (10 mg total) by mouth daily.  90 tablet 1   ELIQUIS  5 MG TABS tablet TAKE 1 TABLET BY MOUTH TWICE A DAY 180 tablet 3   glucose blood (ACCU-CHEK GUIDE TEST) test strip USE AS INSTRUCTED 100 strip 3   linaclotide  (LINZESS ) 290 MCG CAPS capsule Take 1 capsule (290 mcg total) by mouth daily before breakfast. 90 capsule 1   metoprolol  succinate (TOPROL -XL) 50 MG 24 hr tablet Take 1 tablet (50 mg total) by mouth daily. TAKE WITH OR IMMEDIATELY FOLLOWING A MEAL in AM and take 25 mg ( 0.5 tablet) in the PM 135 tablet 3   nystatin  (MYCOSTATIN /NYSTOP ) powder Apply topically 2 (two) times  daily. 60 g 1   ondansetron  (ZOFRAN -ODT) 4 MG disintegrating tablet Take 1 tablet (4 mg total) by mouth every 8 (eight) hours as needed for nausea or vomiting. 20 tablet 1   pantoprazole  (PROTONIX ) 40 MG tablet Take 1 tablet (40 mg total) by mouth daily. 180 tablet 1   pregabalin  (LYRICA ) 150 MG capsule Take 1 capsule (150 mg total) by mouth 2 (two) times daily. 270 capsule 1   SODIUM FLUORIDE 5000 PPM 1.1 % PSTE See admin instructions.     tirzepatide  (MOUNJARO ) 10 MG/0.5ML Pen Inject 10 mg into the skin once a week. 6 mL 1   traMADol  (ULTRAM -ER) 100 MG 24 hr tablet Take 1 tablet (100 mg total) by mouth daily. 60 tablet 2   No current facility-administered medications for this visit.      SABRA  PHYSICAL EXAMINATION:  Vitals:   04/04/24 1306  BP: 106/61  Pulse: 67  Resp: 16  Temp: (!) 96.2 F (35.7 C)  SpO2: 99%   Filed Weights   04/04/24 1306  Weight: 234 lb 14.4 oz (106.5 kg)    Physical Exam Vitals and nursing note reviewed.  HENT:     Head: Normocephalic and atraumatic.     Mouth/Throat:     Pharynx: Oropharynx is clear.  Eyes:     Extraocular Movements: Extraocular movements intact.     Pupils: Pupils are equal, round, and reactive to light.  Cardiovascular:     Rate and Rhythm: Normal rate and regular rhythm.  Pulmonary:     Comments: Decreased breath sounds bilaterally.  Abdominal:     Palpations: Abdomen is soft.   Musculoskeletal:        General: Normal range of motion.     Cervical back: Normal range of motion.  Skin:    General: Skin is warm.  Neurological:     General: No focal deficit present.     Mental Status: She is alert and oriented to person, place, and time.  Psychiatric:        Behavior: Behavior normal.        Judgment: Judgment normal.      LABORATORY DATA:  I have reviewed the data as listed Lab Results  Component Value Date   WBC 5.2 03/22/2024   HGB 13.9 03/22/2024   HCT 42.7 03/22/2024   MCV 94.7 03/22/2024   PLT 153 03/22/2024   Recent Labs    08/17/23 1259 08/26/23 1416 10/26/23 1452 11/29/23 1349 03/06/24 1338 03/22/24 1244  NA 139   < > 139 141 140 139  K 4.0   < > 4.8 4.1 4.4 4.3  CL 104   < > 102 101 101 100  CO2 27   < > 26 26 23 28   GLUCOSE 105*   < > 108* 109* 95 97  BUN 24*   < > 27* 19 14 18   CREATININE 1.42*   < > 1.54* 1.39* 1.26* 1.41*  CALCIUM  9.1   < > 9.7 9.7 9.6 9.7  GFRNONAA 39*  --  35*  --   --  39*  PROT 7.6   < > 7.6 6.3 6.9 7.1  ALBUMIN 4.2   < > 4.4 4.2 4.3 4.2  AST 23   < > 30 25 25 25   ALT 15   < > 15 12 12 8   ALKPHOS 61   < > 70 66 69 70  BILITOT 0.9   < > 1.1 0.7 0.6 0.7   < > =  values in this interval not displayed.    RADIOGRAPHIC STUDIES: I have personally reviewed the radiological images as listed and agreed with the findings in the report. No results found.   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 CBC-hemoglobin normal; normal calcium . MAy 28th, 2025- BONE MARROW-- Normocellular bone marrow with orderly trilineage hematopoiesis and minimal open involvement by a plasma cell population with aberrant  CD117/CD56 expression and apparent lambda predominance consistent with a plasma cell neoplasm. hile there are a limited number of plasma cells on the biopsy, clot section and aspirate (approximately 5%) with an  overall normal immune architecture they do appear lambda predominant which correlates with the serological findings.  In addition, there is aberrant CD56/CD117 expression on the plasma cells consistent with a plasma cell neoplasm.  Cytogenetics- QNS/NEG.   # Discussed that bone marrow/myeloma involvement could be patchy-but given absence of endorgan dysfunction I reviewed with the patient regarding the overall reassuring results of the bone marrow biopsy.  Also reviewed no  obvious endorgan dysfunction at this time. But however worsening lower back pain I would recommend further evaluation of the PET scan.  # Thrombocytopenia [likely artifactual given extensive platelet clumping; recommend citrate tube)- Stable.   # Stage 3b chronic kidney disease- GFR 38 - stable. [Dr.[name]]-unlikely related to multiple myeloma.  # Poorly controlled DM- HbA1c-6.1 -recommend close monitoring of blood sugars/compliance with medications. Stable.   # Hip pain- hip MRI w/o contrast [PCP]-10/26-MRI hip shows no evidence or concerns of myelomatous involvement; more consistent with osteoarthritis. Stable.   # DISPOSITION: # PET scan ASAP- # follow up - TBD- Dr.B   All questions were answered. The patient knows to call the clinic with any problems, questions or concerns.    Cindy JONELLE Joe, MD 04/04/2024 1:56 PM

## 2024-04-04 NOTE — Progress Notes (Signed)
"   No concerns today  "

## 2024-04-10 ENCOUNTER — Ambulatory Visit
Admission: RE | Admit: 2024-04-10 | Discharge: 2024-04-10 | Disposition: A | Discharge status: Home / Self Care | Source: Ambulatory Visit | Attending: Internal Medicine | Admitting: Internal Medicine

## 2024-04-10 DIAGNOSIS — C9000 Multiple myeloma not having achieved remission: Secondary | ICD-10-CM | POA: Insufficient documentation

## 2024-04-10 DIAGNOSIS — I700 Atherosclerosis of aorta: Secondary | ICD-10-CM | POA: Insufficient documentation

## 2024-04-10 DIAGNOSIS — K5730 Diverticulosis of large intestine without perforation or abscess without bleeding: Secondary | ICD-10-CM | POA: Diagnosis not present

## 2024-04-10 DIAGNOSIS — K7460 Unspecified cirrhosis of liver: Secondary | ICD-10-CM | POA: Diagnosis not present

## 2024-04-10 DIAGNOSIS — M129 Arthropathy, unspecified: Secondary | ICD-10-CM | POA: Insufficient documentation

## 2024-04-10 DIAGNOSIS — D472 Monoclonal gammopathy: Secondary | ICD-10-CM

## 2024-04-10 DIAGNOSIS — M51369 Other intervertebral disc degeneration, lumbar region without mention of lumbar back pain or lower extremity pain: Secondary | ICD-10-CM | POA: Diagnosis not present

## 2024-04-10 DIAGNOSIS — Z96653 Presence of artificial knee joint, bilateral: Secondary | ICD-10-CM | POA: Diagnosis not present

## 2024-04-10 LAB — GLUCOSE, CAPILLARY: Glucose-Capillary: 89 mg/dL (ref 70–99)

## 2024-04-10 MED ORDER — FLUDEOXYGLUCOSE F - 18 (FDG) INJECTION
12.7400 | Freq: Once | INTRAVENOUS | Status: AC | PRN
  Administered 2024-04-10: 12.74 via INTRAVENOUS

## 2024-04-11 ENCOUNTER — Ambulatory Visit: Payer: Self-pay | Admitting: Internal Medicine

## 2024-04-12 ENCOUNTER — Other Ambulatory Visit: Payer: Self-pay | Admitting: *Deleted

## 2024-04-12 DIAGNOSIS — D472 Monoclonal gammopathy: Secondary | ICD-10-CM

## 2024-04-22 ENCOUNTER — Other Ambulatory Visit: Payer: Self-pay | Admitting: Nurse Practitioner

## 2024-04-24 NOTE — Telephone Encounter (Signed)
 Too soon for refill.  Requested Prescriptions  Pending Prescriptions Disp Refills   pantoprazole  (PROTONIX ) 40 MG tablet [Pharmacy Med Name: PANTOPRAZOLE  SOD DR 40 MG TAB] 180 tablet 1    Sig: TAKE 1 TABLET (40 MG TOTAL) BY MOUTH TWICE A DAY BEFORE MEALS     Gastroenterology: Proton Pump Inhibitors Passed - 04/24/2024  9:37 AM      Passed - Valid encounter within last 12 months    Recent Outpatient Visits           1 month ago Morbid obesity [facility])   [place] [facility] [place], Darice, NP   4 months ago Hyperlipidemia associated with type 2 diabetes mellitus [facility])   [place] [facility] Melvin Darice, NP   8 months ago Diabetes mellitus treated with injections of non-insulin medication [facility])   [place] [facility] Melvin Darice, NP   9 months ago DM type 2 with diabetic peripheral neuropathy [facility])   [place] [facility] Melvin Darice, NP   9 months ago DM type 2 with diabetic peripheral neuropathy [facility])   [place] [facility] Melvin Darice, NP

## 2024-06-05 ENCOUNTER — Ambulatory Visit: Admitting: Nurse Practitioner

## 2024-10-03 ENCOUNTER — Inpatient Hospital Stay

## 2024-10-17 ENCOUNTER — Inpatient Hospital Stay: Admitting: Internal Medicine
